# Patient Record
Sex: Male | Born: 1945
Health system: Southern US, Community
[De-identification: ages and names within clinical notes are randomized; demographics above are authoritative.]

## PROBLEM LIST (undated history)

## (undated) DIAGNOSIS — M1990 Unspecified osteoarthritis, unspecified site: Secondary | ICD-10-CM

## (undated) DIAGNOSIS — E669 Obesity, unspecified: Secondary | ICD-10-CM

## (undated) DIAGNOSIS — E119 Type 2 diabetes mellitus without complications: Secondary | ICD-10-CM

## (undated) DIAGNOSIS — E039 Hypothyroidism, unspecified: Secondary | ICD-10-CM

## (undated) DIAGNOSIS — I739 Peripheral vascular disease, unspecified: Secondary | ICD-10-CM

## (undated) DIAGNOSIS — I10 Essential (primary) hypertension: Secondary | ICD-10-CM

## (undated) HISTORY — DX: Essential (primary) hypertension: I10

## (undated) HISTORY — DX: Obesity, unspecified: E66.9

## (undated) HISTORY — DX: Type 2 diabetes mellitus without complications: E11.9

## (undated) HISTORY — DX: Unspecified osteoarthritis, unspecified site: M19.90

## (undated) HISTORY — PX: APPENDECTOMY: SHX54

## (undated) HISTORY — DX: Peripheral vascular disease, unspecified: I73.9

## (undated) HISTORY — PX: TONSILLECTOMY: SUR1361

## (undated) HISTORY — DX: Hypothyroidism, unspecified: E03.9

---

## 2007-05-21 ENCOUNTER — Inpatient Hospital Stay (HOSPITAL_COMMUNITY): Admission: EM | Admit: 2007-05-21 | Discharge: 2007-05-27 | Payer: Self-pay | Admitting: Emergency Medicine

## 2007-05-21 IMAGING — CR DG CHEST 1V PORT
1 series · 1 of 1 positions shown · non-contrast
Comparison: none

HISTORY: Chest pain, hypertension, former smoker

PORTABLE CHEST ONE VIEW:
Portable exam [Q9] hours without priors for comparison.
Upper normal heart size.
Tortuous aorta.
Pulmonary vascularity normal.
Question COPD.
Minimal bibasilar atelectasis.
Lungs otherwise clear.

[view not recorded]
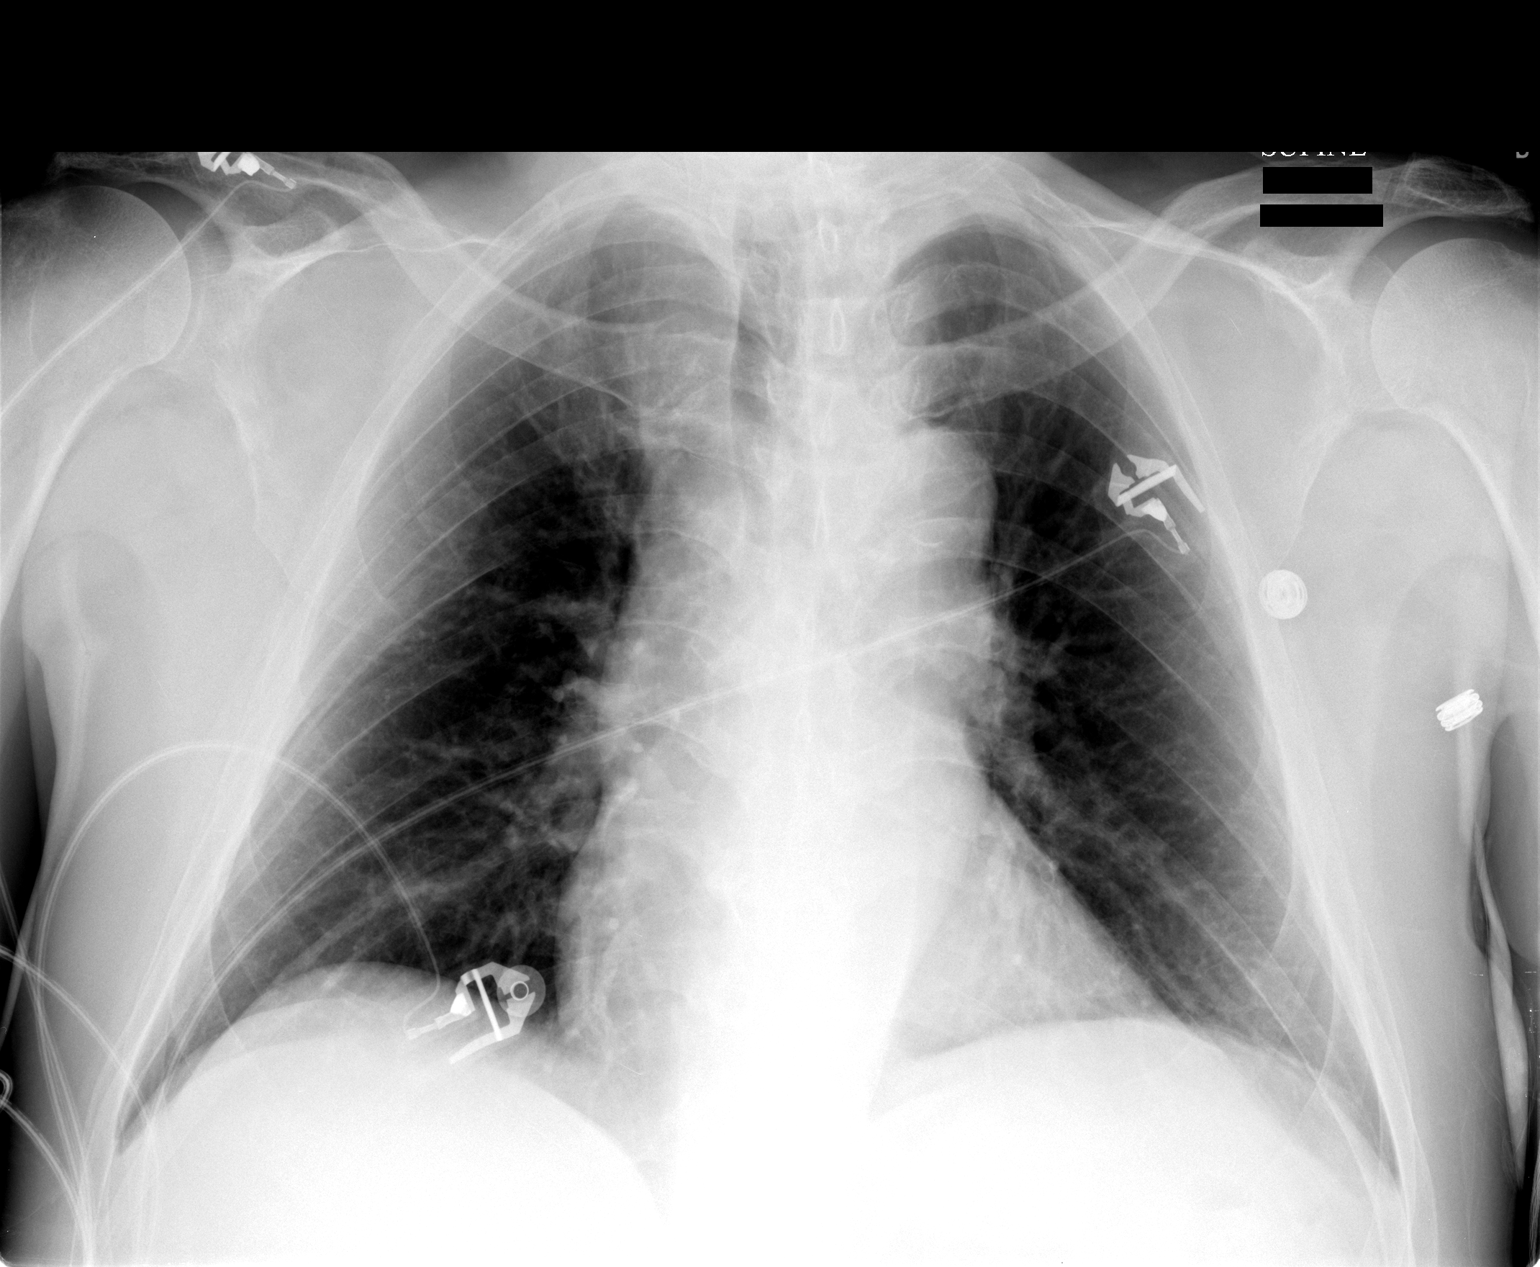

[1 of 1 positions shown; findings below may reference images not displayed]

IMPRESSION: Probable COPD with minimal bibasilar atelectasis.

## 2007-05-22 ENCOUNTER — Encounter: Payer: Self-pay | Admitting: Surgery

## 2007-05-22 ENCOUNTER — Ambulatory Visit: Payer: Self-pay | Admitting: *Deleted

## 2007-05-22 IMAGING — CR DG CHEST 1V PORT
1 series · 1 of 1 positions shown · non-contrast
Comparison: none

CLINICAL DATA: Chest pain.
AP PORTABLE SITTING UPRIGHT CHEST ? [DATE] AT [DATE] A.M.:
Comparison Examination: [DATE] at [DATE] p.m.

[AP]
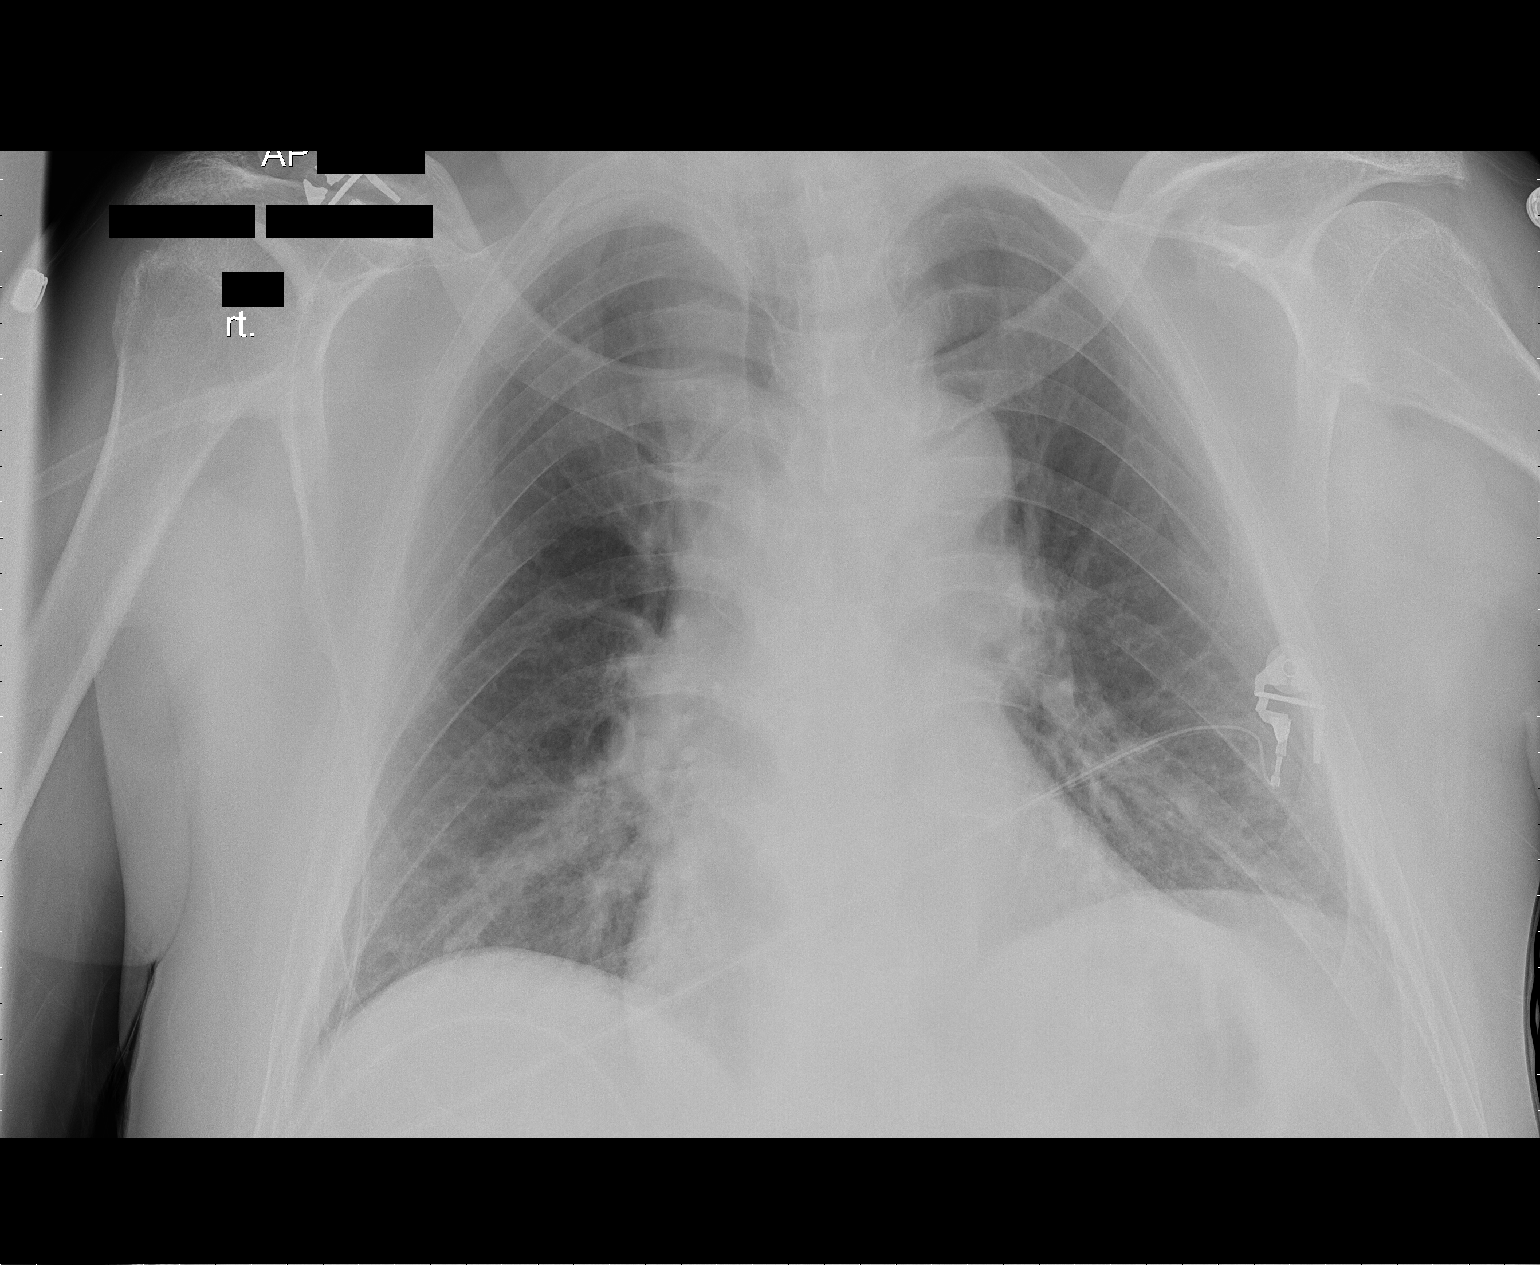

[1 of 1 positions shown; findings below may reference images not displayed]

FINDINGS: Cardiomegaly and mild pulmonary vascular prominence.  Tortuous aorta.  No infiltrate or congestive heart failure. The patient will eventually benefit from a two-view chest.
IMPRESSION: 1.  Cardiomegaly and mild pulmonary vascular prominence. 
2.  Tortuous aorta.
3.  No infiltrate or congestive heart failure.

## 2007-05-23 ENCOUNTER — Encounter: Payer: Self-pay | Admitting: Surgery

## 2007-05-23 ENCOUNTER — Ambulatory Visit: Payer: Self-pay | Admitting: Surgery

## 2007-05-23 IMAGING — CR DG CHEST 1V PORT
1 series · 1 of 1 positions shown · non-contrast
Comparison: [DATE].

CLINICAL DATA: Status post CABG. 
 PORTABLE CHEST ? 1 VIEW:

[view not recorded]
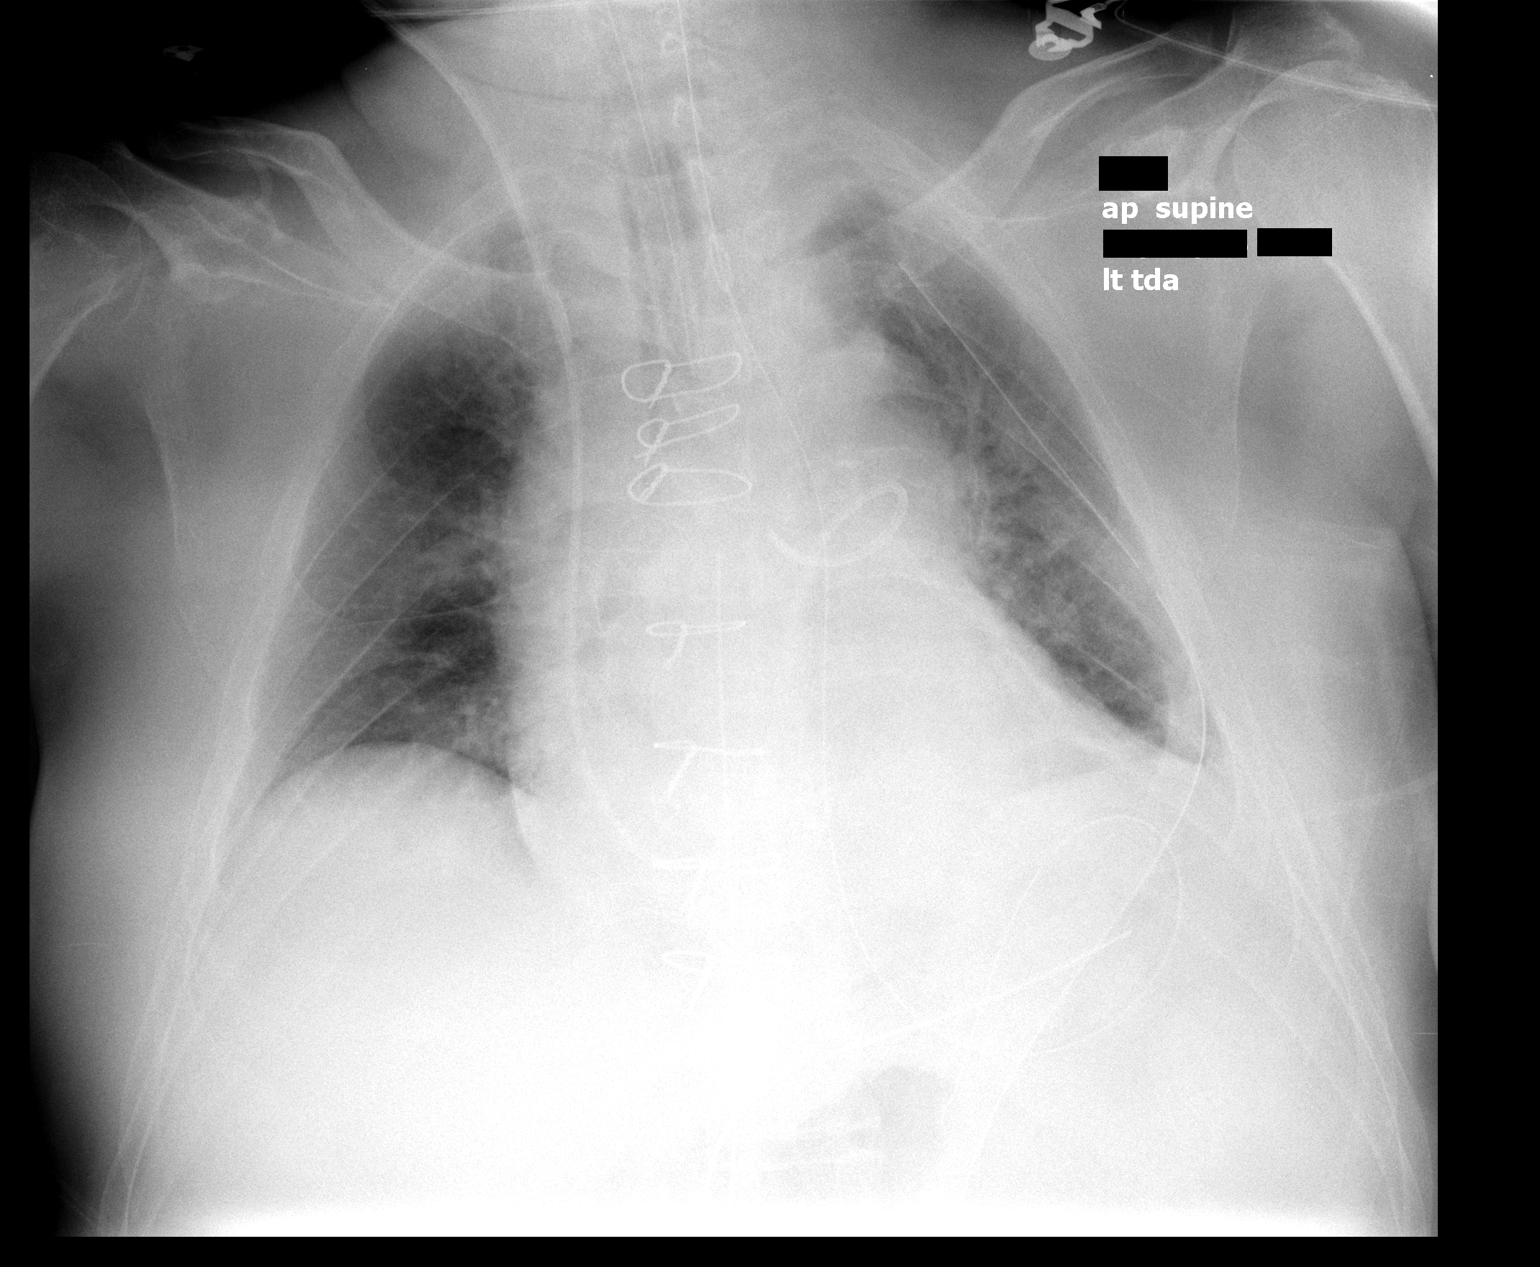

[1 of 1 positions shown; findings below may reference images not displayed]

FINDINGS: The patient is status post cardiac surgery with endotracheal tube tip 3.5 cm above the carina, an NG tube entering the stomach, mediastinal and left thoracostomy tubes and Swan-Ganz catheter with tip extending toward the right pulmonary artery.  This is a low volume film with mild basilar atelectasis and minimal pulmonary vascular congestion.  There is no evidence of pneumothorax.
IMPRESSION: Status post CABG with support tubes as described with mild basilar atelectasis and mild vascular congestion.

## 2007-05-24 IMAGING — CR DG CHEST 1V PORT
1 series · 1 of 1 positions shown · non-contrast
Comparison: [DATE].

CLINICAL DATA: Status post CABG. 
 PORTABLE CHEST - 1 VIEW:

[view not recorded]
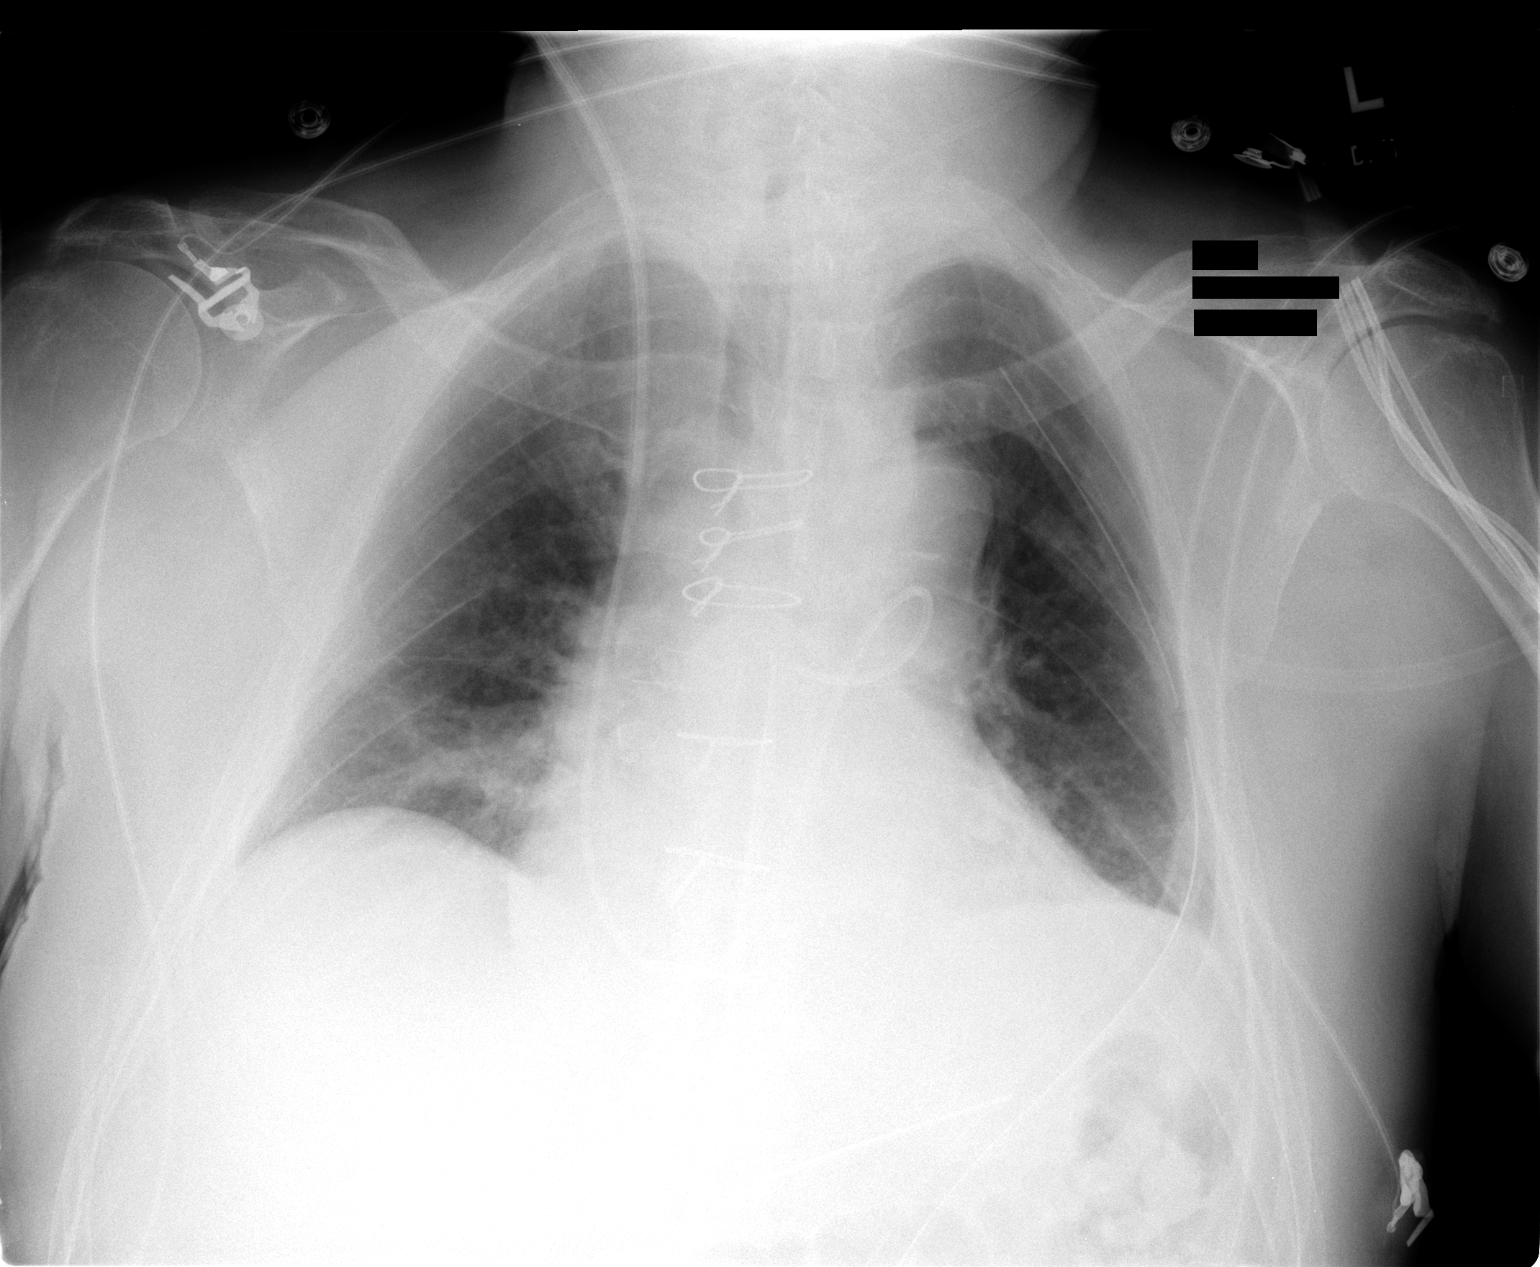

[1 of 1 positions shown; findings below may reference images not displayed]

FINDINGS: Endotracheal  and nasogastric tubes have been removed.  Right IJ Swan-Ganz catheter tip terminates over the right pulmonary artery.  Mediastinal drains and left chest tube remain in place.  No pneumothorax.  Bibasilar plate like atelectasis noted.  No pneumothorax.  Cardiomegaly noted.
IMPRESSION: 1.  Bibasilar plate like atelectasis again noted.  
 2.  Cardiomegaly, no edema.

## 2007-05-25 IMAGING — CR DG CHEST 2V
2 series · 2 of 2 positions shown · non-contrast
Comparison: [DATE].

CLINICAL DATA: 61-year-old with chest pain, bypass surgery. 
 PORTABLE CHEST - 1 VIEW [DATE]:

[w chest pa]
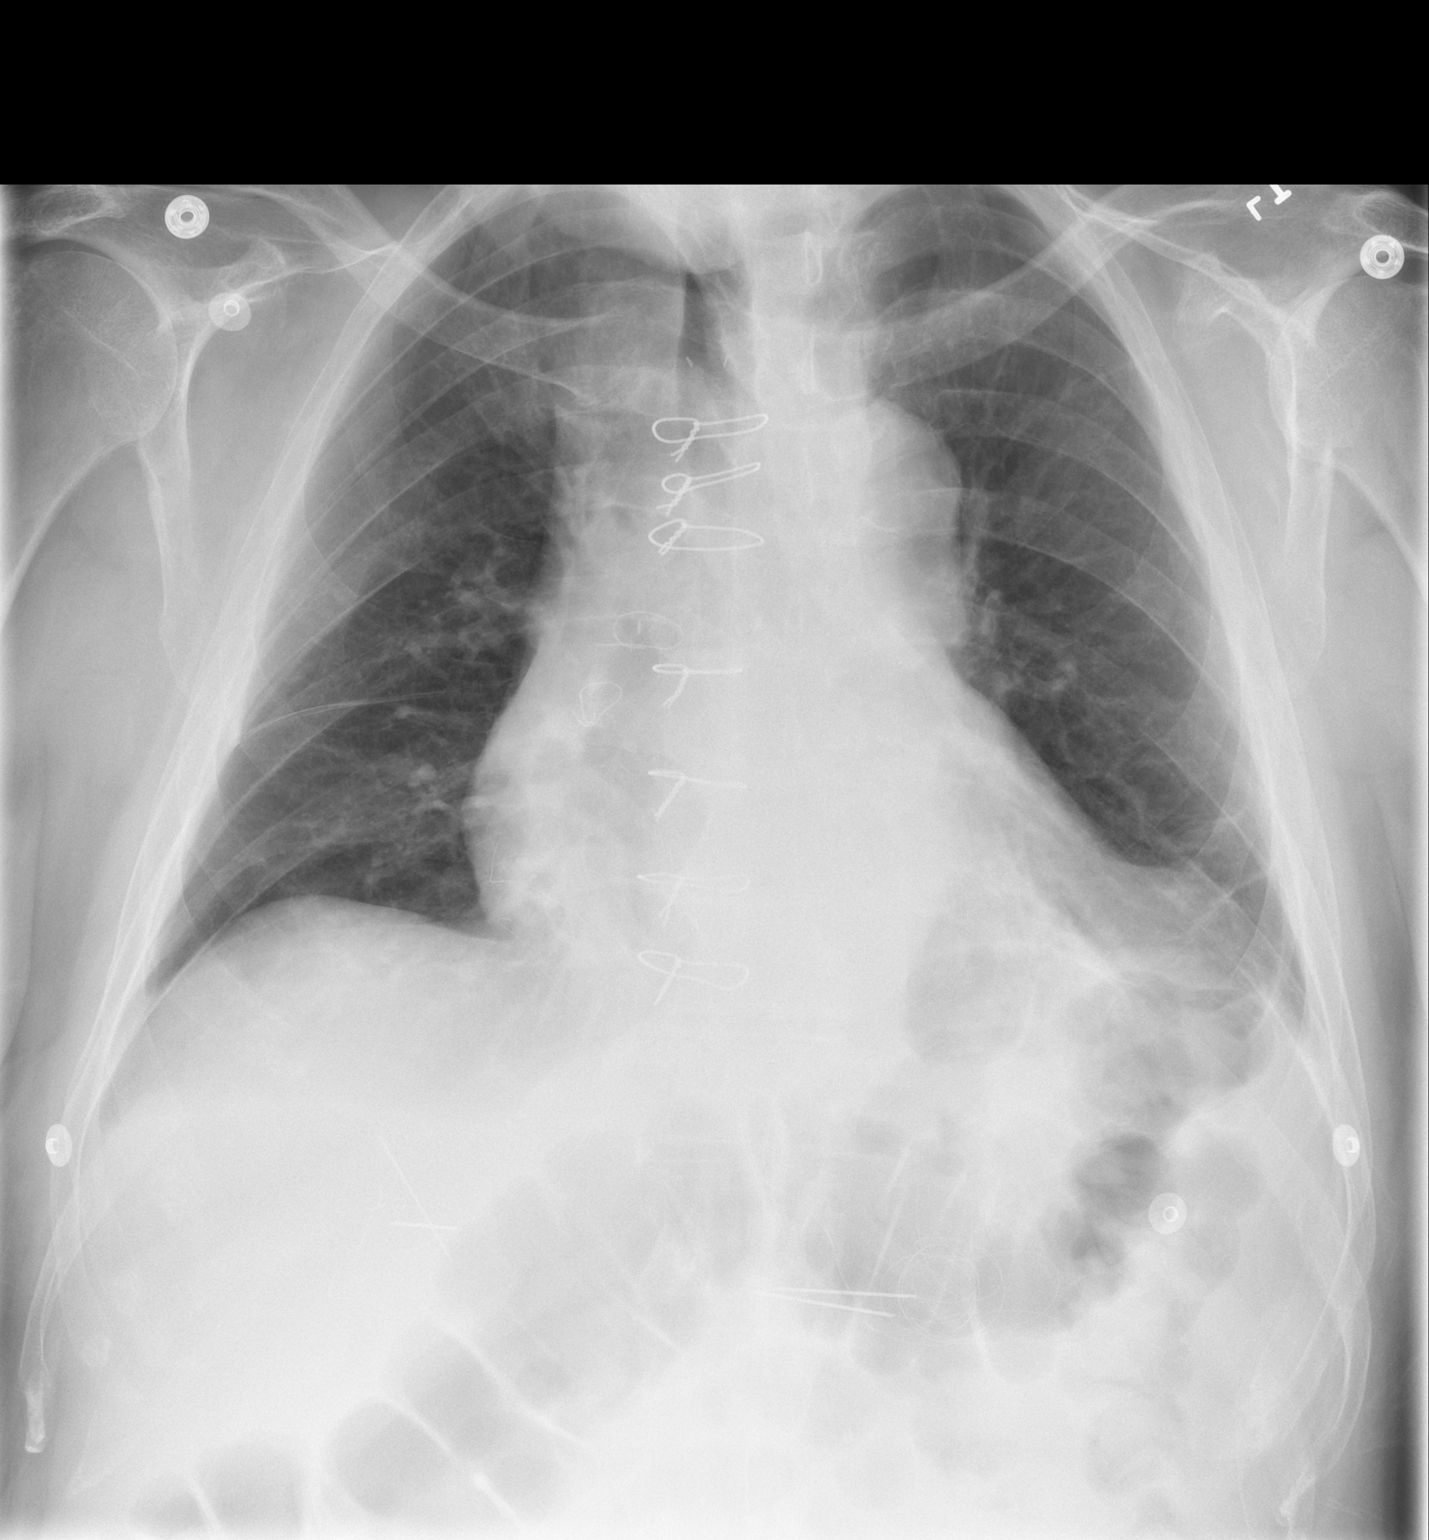

[w chest lat]
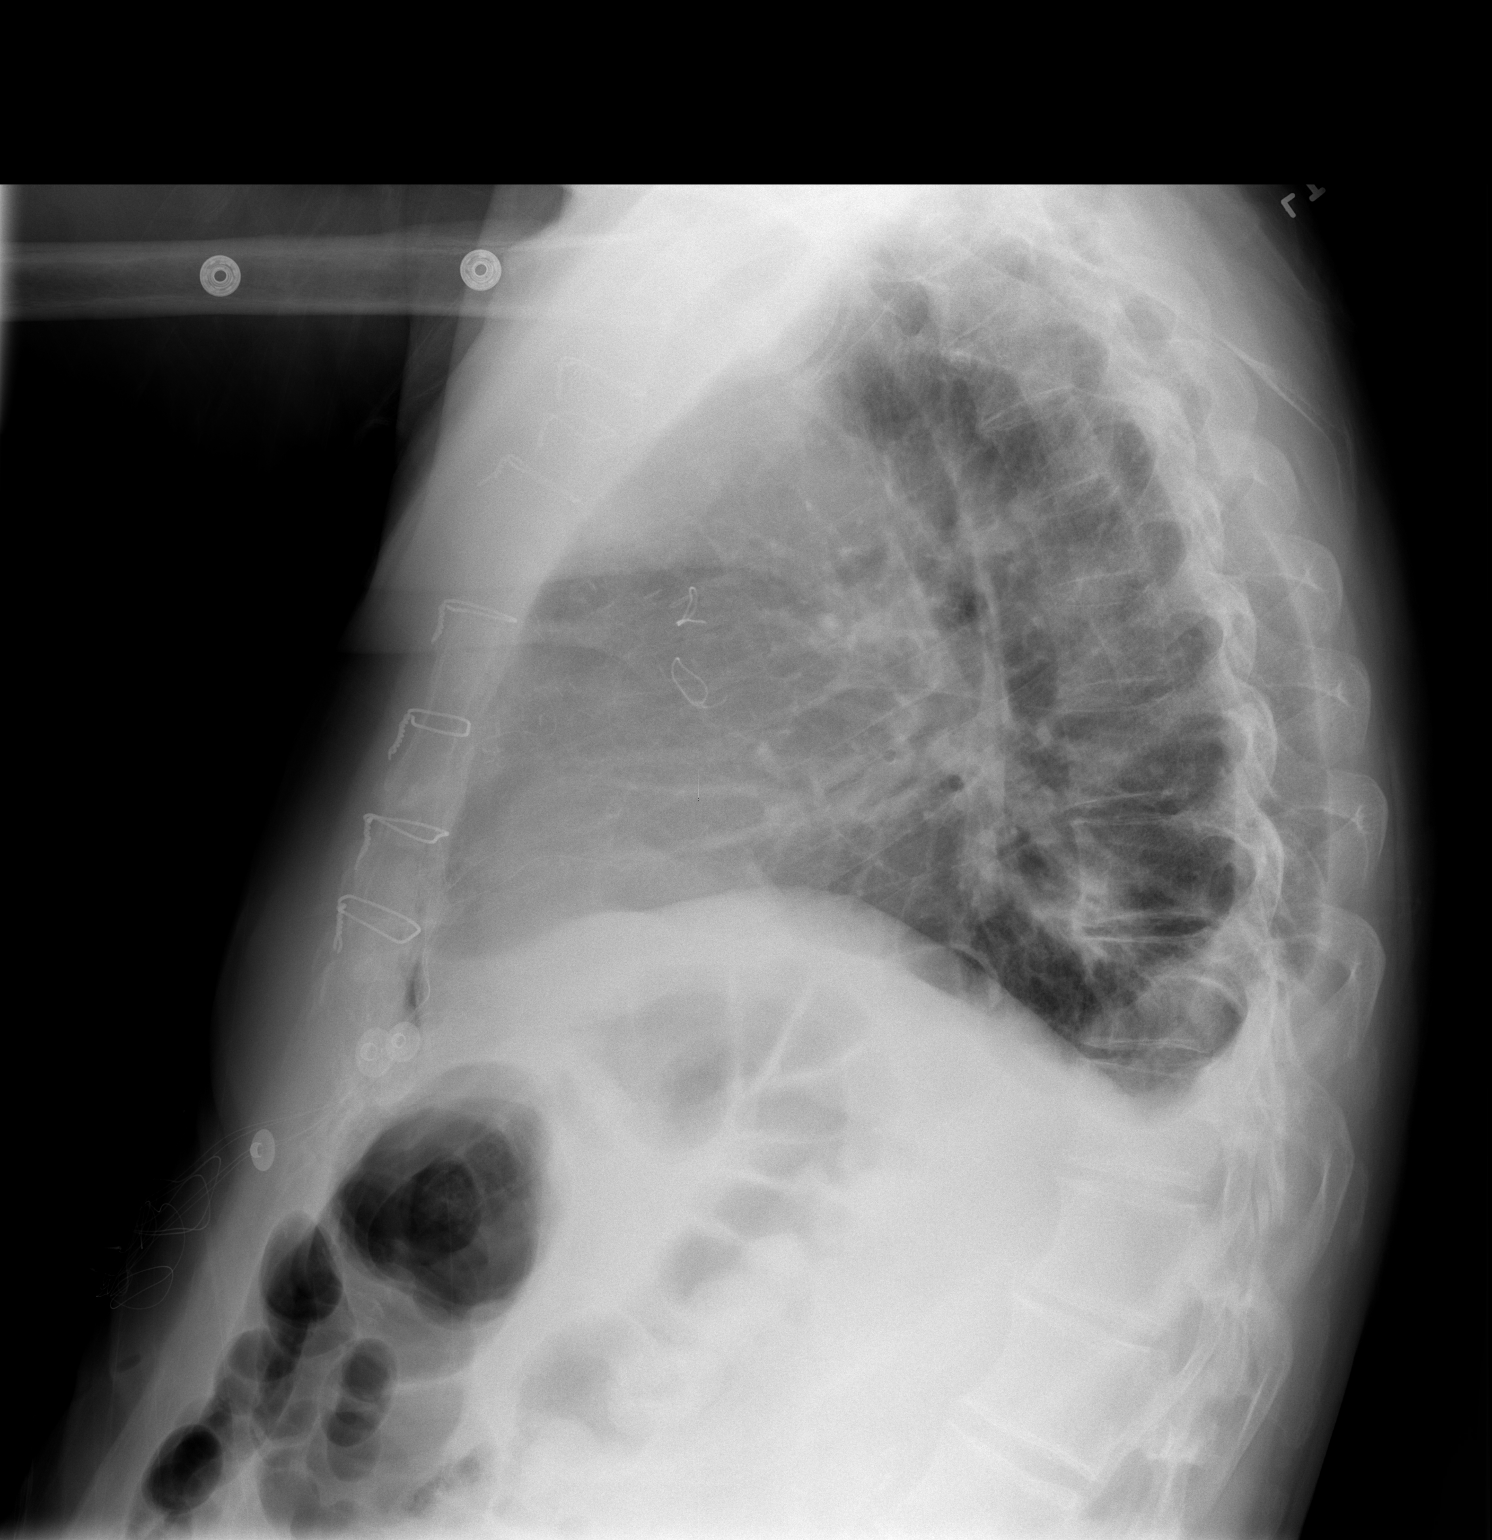

[2 of 2 positions shown; findings below may reference images not displayed]

FINDINGS: The Swan Ganz catheter and mediastinal drain tube has been removed.  The left chest tube has been removed.  No left-sided pneumothorax is seen.  Streaky bibasilar atelectasis and a small left effusion.  No pulmonary edema.
IMPRESSION: 1.  Removal of left-sided chest tube, mediastinal drain tubes and Swan Ganz catheter.  No definite left-sided pneumothorax is seen.  
 2.  Streaky bibasilar atelectasis left greater than right and possible small left pleural effusion.

## 2007-07-03 ENCOUNTER — Encounter: Admission: RE | Admit: 2007-07-03 | Discharge: 2007-07-03 | Payer: Self-pay | Admitting: Surgery

## 2007-07-03 ENCOUNTER — Ambulatory Visit: Payer: Self-pay | Admitting: Surgery

## 2007-07-03 IMAGING — CR DG CHEST 2V
2 series · 2 of 2 positions shown · non-contrast
Comparison: [DATE]

CLINICAL DATA: CABG

[view not recorded (1 of 2)]
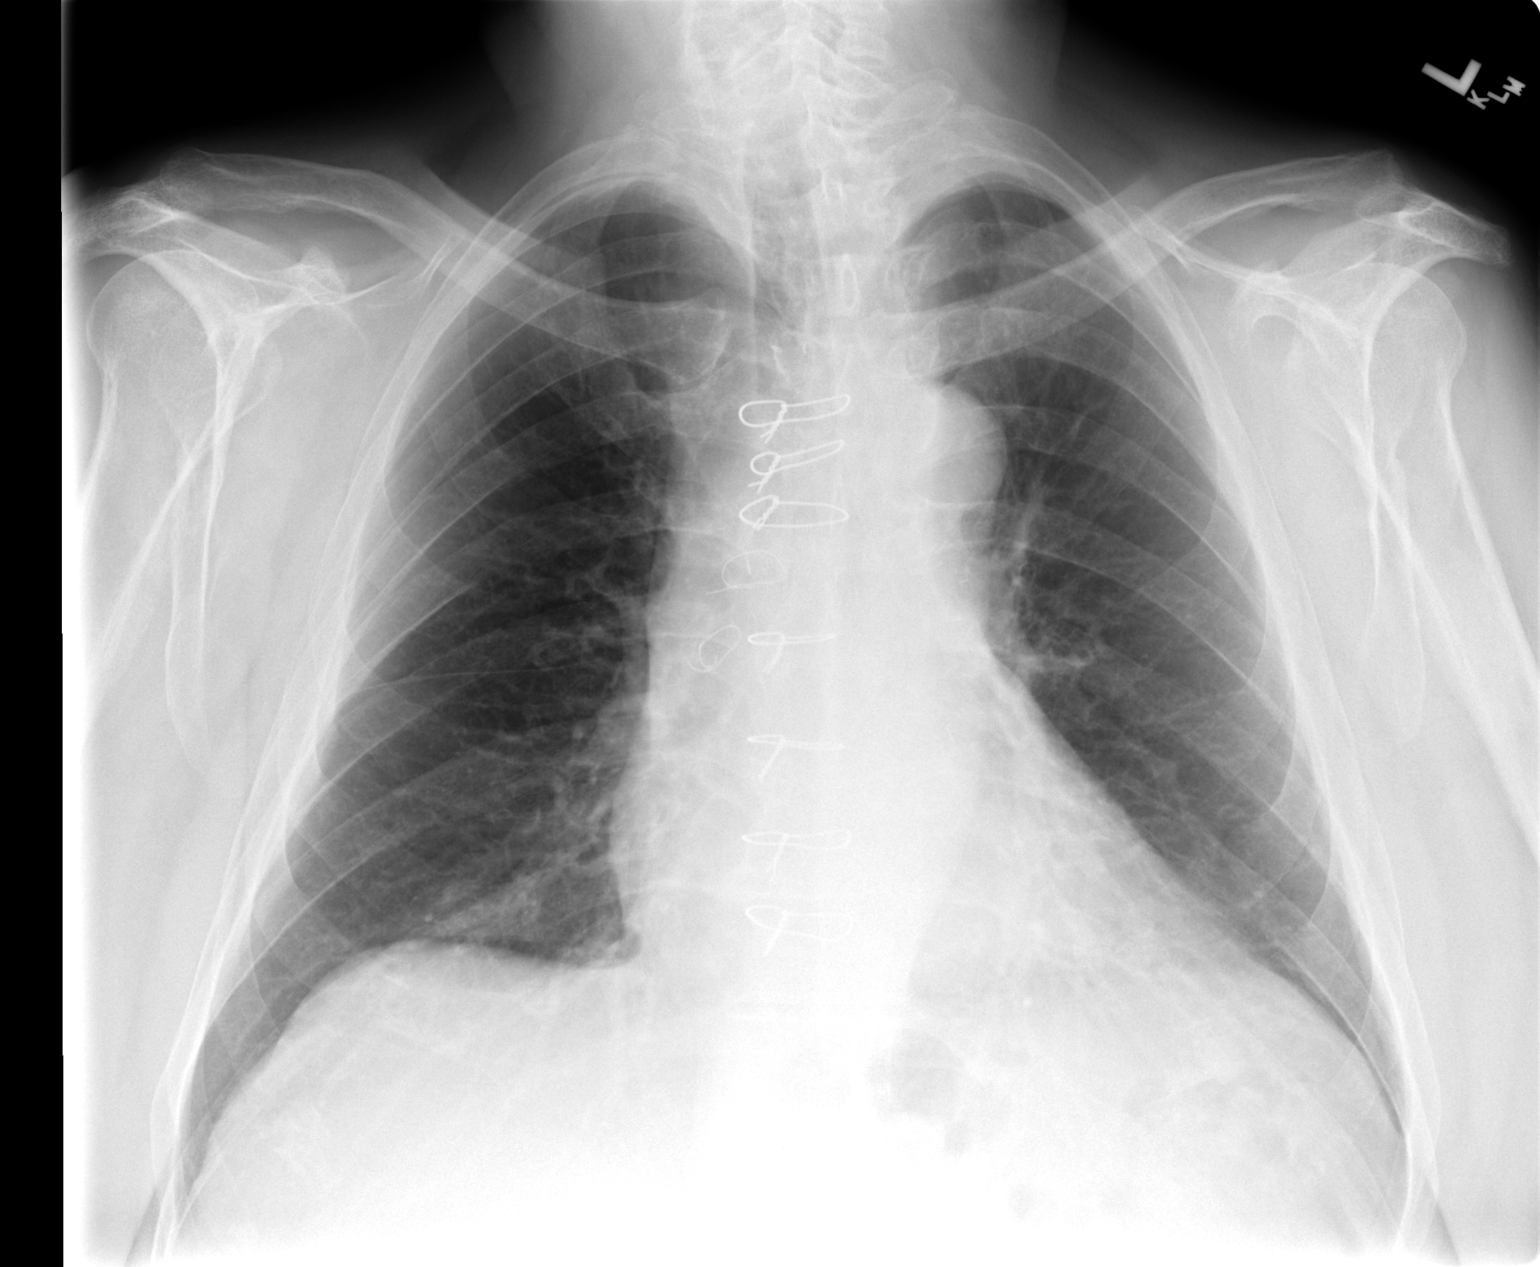

[view not recorded (2 of 2)]
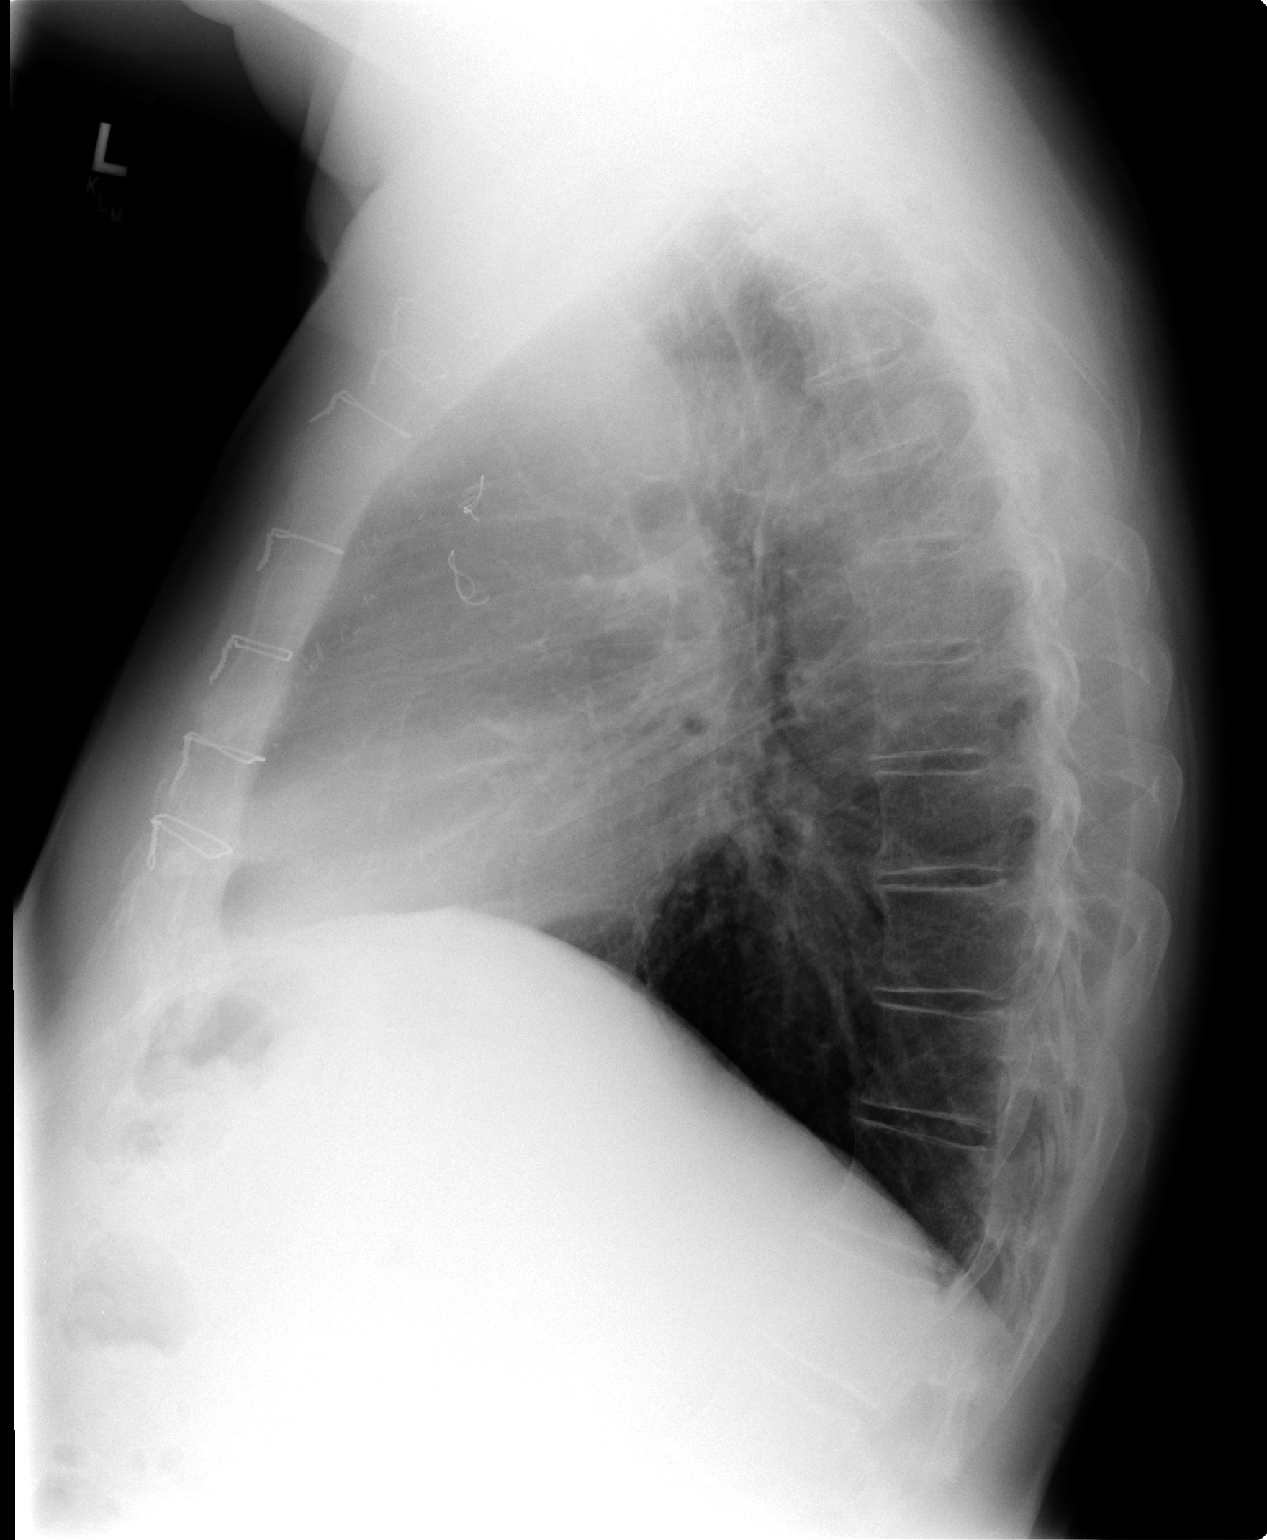

[2 of 2 positions shown; findings below may reference images not displayed]

CHEST - 2 VIEW:

Cardiopericardial silhouette is within normal limits for size. The lungs are
clear bilaterally. Patient is status post CABG. Imaged bony structures of the
thorax are intact.
IMPRESSION: No acute cardiopulmonary process

## 2010-11-09 NOTE — Cardiovascular Report (Signed)
NAME:  Jason Cox, Jason Cox NO.:  [id number]   MEDICAL RECORD NO.:  [id number]          PATIENT TYPE:  INP   LOCATION:  2913                         FACILITY:  [facility]   PHYSICIAN:  Madaline Savage, M.D.DATE OF BIRTH:  02-14-1946   DATE OF PROCEDURE:  05/21/2007  DATE OF DISCHARGE:                            CARDIAC CATHETERIZATION   PROCEDURES PERFORMED:  1. Selective coronary angiography by Judkins' technique.  2. Retrograde left heart catheterization.  3. Left ventricular angiography.  4. Abdominal suprarenal abdominal aortography with bi-femoral runoff.  5. Selective visualization of the left internal mammary artery.   COMPLICATIONS:  None.   ENTRY SITE:  Right femoral dye, used Omnipaque.   MEDICATIONS:  Given IV Versed for sedation, intravenous nitroglycerin,  and intravenous Integralin was begun in the cath lab, as well as IV  heparin.   PATIENT PROFILE:  The patient is a 65 year old seemingly healthy  gentleman who had very little in the way of medical care from his  primary caregiver, [facility].  He entered that office  today with complaints of having some substernal discomfort, which was  graded a 1/10.  He will was thereafter seen by the doctors there, and  after an EKG revealed multiple abnormalities that were concerning for  acute ST-segment elevation MI, he was transported to [facility]  and arrived in the emergency room where I saw him.  I repeated the EKG,  and over the interval time that it took him to get to the hospital there  was worsening of ST elevation.  He was therefore taken emergently to the  cath lab where cardiac catheterization was begun.  Results are described  below:  1. Left main coronary artery normal.  2. LAD 100% occluded about 10 mm from it's origin, retrograde      collateral flow into mid and distal LAD by way of retrograde      collateralization of the circumflex.  Circumflex coronary artery  large and dominant with collateral flow to LAD and to RCA by way of      atrioventricular groove circumflex collateralization.  3. Right coronary artery 100% occluded at the ostium, with homo-      collateralization which was not very abundant.  The main flow into      the distal RCA was by retrograde collateralization from the left      circumflex.  There is bypassable RCA territory distally.  4. LVEF 30% with anterior wall stunting, no mitral regurgitation or LV      thrombus.  5. Left internal mammary artery widely patent throughout and appears      to be a good conduit for potential bypass grafting.  6. Abdominal aortography shows both renal arteries to be widely patent      without disease.  Abdominal aorta also appears to be free of any      disease.  There was some faint calcification along the border of      the abdominal aorta.  Both common femorals appear normal.   Left ventricular angiogram shows stunting of the anterior wall.  Apex is  dyskinetic.  Inferior wall is hypokinetic.  Ejection fraction estimate  is 30%, no mitral regurgitation seen.  That was in the RAO view.  The  LAO view showed that the septum was markedly hypokinetic.  The  anterolateral wall was hyperdynamic.   FINAL IMPRESSIONS:  1. Severe two-vessel disease of the RCA and LAD, with 100% occlusion      of both vessels as described above.  2. Dominance of left circumflex system with collateralization of LAD      and RCA, by way of this circumflex vessel.  The circumflex itself      has multiple areas of narrowing of 75%.  3. LV systolic function 30% ejection fraction.   PLAN:  CVTS consult from Dr. Laneta Simmers.           ______________________________  Madaline Savage, M.D.     WHG/MEDQ  D:  05/21/2007  T:  05/22/2007  Job:  [id number]   cc:   Evelene Croon, M.D.  [facility], [facility]

## 2010-11-09 NOTE — Op Note (Signed)
NAME:  Jason Cox, Jason Cox NO.:  [id number]   MEDICAL RECORD NO.:  [id number]          PATIENT TYPE:  INP   LOCATION:  2315                         FACILITY:  [facility]   PHYSICIAN:  Evelene Croon, M.D.     DATE OF BIRTH:  07/23/1945   DATE OF PROCEDURE:  05/23/2007  DATE OF DISCHARGE:                               OPERATIVE REPORT   PREOPERATIVE DIAGNOSIS:  Severe 3-vessel coronary artery disease, status  post ST-segment elevation myocardial infarction.   POSTOPERATIVE DIAGNOSIS:  Severe 3-vessel coronary disease status post  ST-segment elevation myocardial infarction.   OPERATIVE PROCEDURES:  1. Median sternotomy.  2. Extracorporeal circulation.  3. Coronary bypass graft surgery times 3 using left internal mammary      artery graft to left anterior descending coronary, with a saphenous      vein graft to the obtuse marginal branch of the left circumflex      coronary and a saphenous vein graft to posterior descending branch      of the right coronary.  4. Endoscopic vein harvesting from the right leg.   ASSISTANT:  Coral Ceo, PA-C.   ANESTHESIA:  General endotracheal.   CLINICAL HISTORY:  This patient is a 65 year old gentleman with no prior  cardiac history who presented with a 56-month history of intermittent  substernal chest pain at rest and with exertion.  He was presenting to  his primary care physician for symptoms of exacerbation of his psoriasis  as well as severe throat and sinus drainage.  After relating his chest  pain symptoms, he had electrocardiogram which was markedly abnormal and  he was referred to the emergency room, where he was again noted to have  ST-segment elevation anteriorly as well as inferior ST depression.  His  initial CPK was fairly normal with mild elevation of his troponin.  His  enzymes significantly increased after admission.  After his initial  presentation in the emergency room, he was taken to catheterization lab  emergently where catheterization showed severe 3-vessel disease.  There  was total occlusion of his proximal LAD after a small first diagonal  branch.  There were faint collaterals filling the distal LAD.  The left  circumflex gave off a large branching marginal.  There was about 50%  narrowing in the first subbranch of this marginal and 75% narrowing  within the continuation of the marginal branch.  The right coronary  artery was occluded proximally with bridging collaterals as well as  collaterals from the left circumflex filling the posterior descending  branch.  Ejection fraction of about 30% with anterior apical akinesis.  There was no mitral regurgitation.  The patient became pain free after  cardiac catheterization on heparin nitroglycerin and Integrilin.  It was  felt that coronary bypass graft surgery was the best treatment to  further ischemia and infarction and hopefully improve his anterior wall  which may have been stunned.  I discussed the operative procedure with  the patient including alternatives, benefits and risks including but not  limited to bleeding, blood transfusion, infection, stroke, myocardial  infarction, graft failure and death.  He understood  and agreed to  proceed.   OPERATIVE PROCEDURE:  The patient was taken to the operating room and  placed on the table supine in position.  After induction of general  endotracheal anesthesia, a Foley catheter was placed in bladder using  sterile technique.  The chest, abdomen and both lower extremities were  prepped and draped in the usual sterile manner.  The chest was entered  through a median sternotomy incision.  The pericardium opened in the  midline.  Examination of the heart showed good ventricular  contractility.  The ascending aorta had no palpable plaques in it.   A transesophageal echocardiogram was performed by Anesthesiology.  This  showed overall very good left ventricular function.  The anterior wall   looked like it was contracting well.  There was some septal hypokinesis.  There was no mitral regurgitation.   Then the left internal mammary artery was harvested from the chest wall  as pedicle graft.  This was a medium caliber vessel with excellent blood  pressure.  At the same time, a segment of greater saphenous vein was  harvested from the right leg using endoscopic vein harvest technique.  This vein was of medium size and good quality.   Then the patient was heparinized and when an adequate ACT was obtained,  the distal ascending aorta was cannulated using a  20-French aortic  cannula for arterial inflow.  Venous outflow was achieved using a 2-  stage venous cannula through the right atrial appendage.  Antegrade  cardioplegia and vent cannula was inserted in the aortic root.  Retrograde quick pinch cannula was inserted through the right atrium  into the coronary sinus.   The patient placed on cardiopulmonary bypass and the distal coronaries  identified.  The LAD was intramyocardial throughout its proximal and mid-  portions.  It exited near the apex of the heart, where it was a small  vessel.  The diagonal branch was small.  The obtuse marginal branch was  completely intramyocardial.  I was able to locate the major subbranch of  this which was a large graftable vessel.  There was a large amount  epicardial fact completely covering his heart which made exposure of the  vessels more difficult.  The right coronary artery gave off a moderate  size posterior descending branch.  There was no significant disease in  that.   Then the aorta was cross-clamped and 400 mL of cold blood antegrade  cardioplegia was administered in the aortic root with slow arrest the  heart.  This was followed by 400 mL of cold blood retrograde  cardioplegia which resulted in complete arrest of the heart.  Systemic  hypothermia to 28 degrees centigrade and topical hypothermic iced saline  was used.  A  temperature probe was placed in septum and insulating pad  in the pericardium.  We were able to get myocardial temperature down to  around 8 degrees centigrade.  Additional doses of retrograde  cardioplegia were given about 20-minute intervals.   The first distal anastomosis was then performed to the posterior  descending artery.  The internal diameter was about 1.75 mm.  The  conduit used was a segment of greater saphenous vein.  The anastomosis  performed in an end-to-side manner using continuous 7-0 Prolene suture.  Flow was noted through the graft and was excellent.   The second distal anastomosis was performed in the obtuse marginal  branch.  The internal diameter was about 2 mm.  The conduit used was  a  second segment of the greater saphenous vein.  Anastomosis performed in  an end-to-side manner using continuous  7-0 Prolene suture.  Flow was  noted through the graft and was excellent.   The third distal anastomosis was performed to the distal LAD.  I tried  to find the LAD in its proximal and mid-portions but it was deep  intramyocardial and I could not locate it.  The distal LAD was small but  felt to be graftable.  It was traced proximally until it entered the  septum.  Just beyond this point, I opened it and the internal diameter  was about 1.5 mm.  A probe was passed proximally and passed most of the  way up the LAD.  It was indeed deep within the septum.  The conduit used  was a left internal mammary graft and this was brought through an  opening in the left pericardium anterior to the phrenic nerve.  It was  anastomosed to the LAD in an end-to-side manner using continuous 8-0  Prolene suture.  The pedicle was sutured to the epicardium with 6-0  Prolene sutures.  The patient was then rewarmed to 37 degrees  centigrade.  With the cross-clamp in place, the 2 proximal vein graft  anastomoses were performed in an end-to-side manner using continuous 6-0  Prolene suture.  Then  the clamp was removed from the mammary pedicle.  There was rapid warming of the ventricular septum and return of  spontaneous ventricular fibrillation.  Cross-clamp was removed at the  time of 85 minutes and the patient was defibrillated into sinus rhythm.   The proximal and distal anastomosis appeared hemostatic and the grafts  satisfactory.  Graft markers were placed around the proximal  anastomosis.  Two temporary right ventricular and right atrial pacing  wires placed were placed and brought out through the skin.   When the patient rewarmed to 37 degrees centigrade, he was weaned from  cardiopulmonary bypass on no inotropic agents.  Total bypass time was  110 minutes.  Cardiac function appeared excellent with a cardiac output  of 5 liters per minute.  Transesophageal echocardiogram showed  essentially normal left ventricular function.  Even the septum appeared  to be moving better.  Protamine was then given and the venous and aortic  catheters removed without difficulty.  Hemostasis was achieved.  Three  chest tubes were placed in; 2 in the post pericardium, 1 left pleural  space and 1 in the anterior mediastinum.  The sternum was then closed  with #6 stainless steel wires.  Fascia was closed with continuous #1  Vicryl suture.  Subcutaneous tissue was closed with continuous 2-0  Vicryl and the skin with 3-0 Vicryl subcuticular closure.  The lower  extremity vein harvest site was closed in layers in a similar manner.  The sponge, needle and instrument counts were correct according to the  scrub nurse.  Dry sterile dressings were applied over the incisions and  around the chest tubes which were at Pleur-Evac suction.  The patient  remained hemodynamically stable and was transported to the SICU in  guarded but stable condition.      Evelene Croon, M.D.  Electronically Signed     BB/MEDQ  D:  05/23/2007  T:  05/24/2007  Job:  [id number]   cc:   Madaline Savage, M.D.

## 2010-11-09 NOTE — Consult Note (Signed)
NAME:  Jason Cox, Jason Cox NO.:  [id number]   MEDICAL RECORD NO.:  [id number]          PATIENT TYPE:  INP   LOCATION:  2399                         FACILITY:  [facility]   PHYSICIAN:  Evelene Croon, M.D.     DATE OF BIRTH:  [birth year]   DATE OF CONSULTATION:  05/22/2007  DATE OF DISCHARGE:                                 CONSULTATION   REASON FOR CONSULTATION:  Severe three-vessel coronary artery disease,  status post acute ST-segment elevation MI.   CLINICAL HISTORY:  I was asked by Dr. Chanda Busing to evaluate [name] for consideration of coronary artery bypass graft surgery.  He  is a 65 year old gentleman with no prior cardiac history who presented  to his primary physician Dr. Sherwood Gambler in [place] yesterday morning with  complaints of sore throat and sinus drainage as well as some  exacerbation of his psoriasis.  He had been having intermittent  substernal chest pressure with rest and exertion over the past 2 months  and related this history to Dr. Sherwood Gambler since he was having some while he  was in the office.  He had an electrocardiogram that was abnormal, and  he was transferred to [facility] as an acute ST-segment elevation MI.  In the Emergency Room, he was noted to have an electrocardiogram showing  ST elevation in lead V1  and ST depression in lead V3 through 6 as well  as 2, 3, and aVF.  He was taken to the catheterization lab urgently  which showed severe three-vessel disease.  There was 100% proximal LAD  occlusion after the first diagonal branch which was small.  There were  faint collaterals to the LAD from the left circumflex.  The left  circumflex gave off a single large branching marginal artery that had 3  sub-branches.  The first sub-branch had about 50% ostial proximal  stenosis and the continuation of this marginal branch had about 75%  proximal stenosis.  The second sub-branch had about 50% proximal  stenosis.  The right coronary artery was  occluded in its proximal  portion with bridging collaterals filling the distal vessel which had a  large posterior descending branch.  Ejection fraction was about 30% with  the anterior apical akinesis.   REVIEW OF SYSTEMS:  GENERAL:  He has had no fever or chills.  He has had  no recent weight changes.  He denies any fatigue.  EYES:  Negative.  ENT:  Negative.  ENDOCRINE:  He denies diabetes and hypothyroidism.  CARDIOVASCULAR:  As above.  He has had about 2 month history of  intermittent substernal chest pain which he thought was due to  heartburn.  He has had no shortness of breath.  He denies any PND or  orthopnea.  He has had no peripheral edema.  Denies palpitations.  RESPIRATORY:  Denies cough and sputum production.  GI:  He denies nausea  or vomiting.  He has had no melena or bright red blood per rectum.  GU:  He denies dysuria and hematuria.  MUSCULOSKELETAL:  He has a history of  arthritis particularly involving his hands.  SKIN:  He has a history of  psoriasis which has recently exacerbated.   ALLERGIES:  NONE.   PAST MEDICAL HISTORY:  History of arthritis and psoriasis.  This only  previous surgery was appendectomy.   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  He has 1 brother and is unmarried with no children.   FAMILY HISTORY:  Positive for cardiac disease.  His father died at age  14, myocardial infarction.   PHYSICAL EXAMINATION:  VITAL SIGNS:  His blood pressure is 93/55.  His  pulse is 79 and regular.  Respiratory rate is 16 and unlabored.  GENERAL:  He is a well-developed, white male in no distress.  Weight is  90 kg.  HEENT:  Normocephalic and atraumatic.  Pupils are equal and reactive to  light and accommodation.  Extraocular muscles are intact.  His throat is  clear.  NECK:  Normal carotid pulses bilaterally.  There are no bruits.  There  is no adenopathy or thyromegaly.  CARDIAC:  Regular rate and rhythm with  normal S1 and S2.  There is no  murmur, rub, or gallop.  LUNGS:  Clear.  ABDOMEN:  Active bowel sounds.  His abdomen is soft and nontender.  There are no palpable masses or organomegaly.  EXTREMITIES:  No peripheral edema.  Pedal pulses are strongly palpable  bilaterally.  SKIN:  Warm and dry.  He has multiple areas of psoriasis.  NEUROLOGIC:  Alert and oriented x3.  Motor and sensory exams grossly  normal.   LABORATORY EXAMINATION:  Peak CPK of 648 with an MB of 80, peak troponin  of 14.  His initial CPK was negative with a troponin of 0.18 on  presentation.  Hemoglobin A1c was 5.7.   IMPRESSION:  Jason Cox has severe three-vessel coronary artery disease  presenting with ST-segment elevation myocardial infarction.  His left  anterior descending and right coronary occlusions are chronic, and he  most likely had an increase in oxygen demand resulting in myocardial  infarction.  I agree that coronary artery bypass graft surgery is the  best treatment to prevent further ischemic infarction and hopefully  improve his anterior wall function.  I discussed the operative procedure  with the patient including alternatives, benefits, and risks including  but not limited to bleeding, blood transfusion, infection, stroke,  myocardial infarction, graft failure, and death.  He understands and  agrees to proceed.  We will plan to do this tomorrow.      Evelene Croon, M.D.  Electronically Signed     BB/MEDQ  D:  05/23/2007  T:  05/23/2007  Job:  [id number]   cc:   Madaline Savage, M.D.

## 2010-11-09 NOTE — Assessment & Plan Note (Signed)
OFFICE VISIT   Jason Cox, CUPP  DOB:  1945/08/22                                        July 03, 2007  CHART #:  [id number]   The patient returned today for followup status post coronary artery  bypass graft surgery on 05/23/2007.  He has done well postoperatively.  He said that his only complaint is that he still has some difficulty  keeping his mind focused on different tasks.  He has had no memory  disturbance or any other neurologic symptoms.  He has been walking daily  without chest pain or shortness of breath.  He is not participating in  cardiac rehab.   On physical examination, blood pressure is 141/85, his pulse is 104 and  regular.  Respiratory rate is 20 and unlabored.  Oxygen saturation in  room air is 98%.  He looks well.  Cardiac exam shows a regular rate and  rhythm with normal heart sounds.  His lung exam is clear.  Chest  incision is healing well and the sternum is stable.  His leg incision is  healing well and there is no lower extremity edema.   Followup chest x-ray today shows no acute cardiopulmonary process.   IMPRESSION:  Overall, Mr. Affeldt is recovering well following his  surgery.  I told him he could return to driving a car but should refrain  from lifting anything heavier than 10 pounds for a total of 3 months  from the date of surgery.  I encouraged him to continue walking as much  as possible.  He will continue to follow up with Dr. Elsie Lincoln and will  contact me if he develops any problems with the incisions.   Evelene Croon, M.D.  Electronically Signed   BB/MEDQ  D:  07/03/2007  T:  07/04/2007  Job:  [id number]   cc:   Madaline Savage, M.D.

## 2010-11-09 NOTE — Cardiovascular Report (Signed)
NAME:  Jason Cox, Jason Cox NO.:  [id number]   MEDICAL RECORD NO.:  [id number]          PATIENT TYPE:  INP   LOCATION:  2807                         FACILITY:  [facility]   PHYSICIAN:  Madaline Savage, M.D.DATE OF BIRTH:  10/28/1945   DATE OF PROCEDURE:  05/21/2007  DATE OF DISCHARGE:                            CARDIAC CATHETERIZATION   PROCEDURES PERFORMED:  1. Selective coronary angiography by Judkins technique, emergency.  2. Retrograde left heart catheterization.  3. Left ventricular angiography.  4. Suprarenal abdominal aortography.  5. Selective visualization of the an unbypassed left internal mammary      artery.   COMPLICATIONS:  None.   ENTRY SITE:  Right femoral.   DYE USED:  Omnipaque.   PATIENT PROFILE:  [name] is a 65 year old single white gentleman  who is a medical patient of Dr. Patrica Duel from [facility] in [place], who went to his office today and with the  complaints of having a sensation in his upper chest that he graded as  1/10.  In retrospect, the patient has a lot of denial about this in  terms of duration and severity.  It would appear that he has had these  symptoms for a few months.  In Dr. Geanie Logan office, an EKG was  performed, and it was abnormal, with ST-segment elevation and depression  in multiple leads.  Dr. Nobie Putnam initiated a transfer from his office to  the [facility] ER, where I met the patient, and when the EKG was repeated the  ST-segment elevations were worse in V1, and the depressions were worse  in V2-V6, including the inferior leads.  The patient was quickly  evaluated, blood was drawn, IVs were started, and he was brought to the  catheterization lab, which is where we ended up making the diagnosis of  coronary disease.  He was placed on the catheterization lab table, and  during catheterization no complications occurred.   RESULTS:   PRESSURES:  The patient's systemic pressure on entering  the  catheterization lab was 190/101, with a mean of 140.   The patient's anatomy was as follows:  The left main coronary artery was  patent and normal in appearance.  It trifurcated into an LAD, diagonal,  and circumflex.  The LAD was occluded approximately 10 mm beyond its  origin from the left main coronary artery, and there was no antegrade  flow into the mid- or distal LAD.  There was noted to be slow collateral  flow almost all the way back to the point of occlusion of the vessel by  way of retrograde collateral flow from the circumflex.  The diagonal  branch was a small vessel but potentially bypassable.   Left circumflex coronary artery was a huge and dominant vessel   Dictation ended at this point.           ______________________________  Madaline Savage, M.D.     WHG/MEDQ  D:  05/21/2007  T:  05/22/2007  Job:  [id number]

## 2010-11-09 NOTE — Discharge Summary (Signed)
NAME:  Jason Cox, Jason Cox NO.:  [id number]   MEDICAL RECORD NO.:  [id number]          PATIENT TYPE:  INP   LOCATION:  2020                         FACILITY:  [facility]   PHYSICIAN:  Evelene Croon, M.D.     DATE OF BIRTH:  1946-04-19   DATE OF ADMISSION:  05/21/2007  DATE OF DISCHARGE:                               DISCHARGE SUMMARY   PRIMARY ADMITTING DIAGNOSIS:  Chest pain.   ADDITIONAL/DISCHARGE DIAGNOSES:  1. Acute ST-segment elevation myocardial infarction.  2. Severe three-vessel coronary artery disease.  3. Psoriasis.  4. History of arthritis.   PROCEDURES PERFORMED:  1. Cardiac catheterization.  2. Coronary artery bypass grafting x3 (left internal mammary artery to      the left anterior descending artery, saphenous vein graft to the      obtuse marginal, saphenous vein graft to the posterior descending).  3. Endoscopic vein harvest, right thigh.   HISTORY:  The patient is a 65 year old male with no significant past  medical history.  He presented on the date of this admission to his  primary care physician, Dr. Sherwood Gambler, complaining of sore throat and sinus  drainage as well as an acute exacerbation of psoriasis.  In addition, he  related a history of intermittent chest discomfort which occurred with  exertion and at rest, which had been going on for the past 2 months.  They performed an EKG in the office, which was abnormal and was  indicative of an acute ST-segment elevation myocardial infarction.  Because of these findings, he was transferred to [facility] for  further evaluation and treatment.   HOSPITAL COURSE:  He was seen in the emergency department and was found  to have ST changes with elevations in lead V1 and depression in leads V3-  V6 as well as leads II, III and aVF.  He was seen in the emergency  department by [facility] and was taken urgently to  the catheterization lab, where cardiac catheterization showed  severe  three-vessel coronary artery disease.  He was not felt to be a good  candidate for percutaneous intervention.  At this point he was having no  pain and he was returned to the floor for a cardiothoracic surgery  consultation.  Dr. Evelene Croon saw the patient and reviewed his films  and agreed that his best course of action would be to proceed with  surgical revascularization.  He explained the risks, benefits and  alternatives of the procedure to the patient and he agreed to proceed  with surgery.  He remained stable in the hospital during his  preoperative workup which included carotid Dopplers which showed no  significant ICA stenosis bilaterally and ABIs, which were 1.18 on the  right and 0.99 on the left.  He was taken to the operating room on  May 23, 2007, and underwent CABG x3 as described in detail above.  He tolerated the procedure well and was transferred to the SICU in  stable condition.  Postoperatively he has done well.  He was extubated  shortly after surgery.  He was hemodynamically stable and doing  well on  postop day #1.  At that time he was able to be transferred to the step-  down unit.  The he has continued to make progress and now has been  transferred to the floor.  He has been mildly volume-overloaded and has  been diuresed back down to below his preoperative weight.  He also has  had mild blood loss anemia and has been started on iron supplements.  He  has not required transfusion.  He has been started on a beta blocker and  a statin.  His incisions are all healing well.  He has remained afebrile  and all vital signs have been stable.  He is ambulating in the halls  cardiac rehab phase one is making good progress.  He is tolerating a  regular diet and is having normal bowel and bladder function.  It is  felt that if he continues to remain stable over the next 48 hours, that  he will hopefully be ready for discharge home on May 27, 2007,  pending  morning round evaluation.  His most recent labs show hemoglobin  8.7, hematocrit 25.2, white count 14.5, platelets 233.  Sodium 139,  potassium 3.8, BUN 8, creatinine 1.05.  He will have repeat labs on the  morning of July 16, 2006.   DISCHARGE MEDICATIONS:  1. Enteric-coated aspirin 325 mg daily.  2. Toprol XL 25 mg daily.  3. Crestor 10 mg q.h.s.  4. Tylox one to two q.4h. p.r.n. for pain.   DISCHARGE INSTRUCTIONS:  He is asked to refrain from driving, heavy  lifting or strenuous activity.  He may continue ambulating daily and  using his incentive spirometer.  He may shower daily and clean his  incisions with soap and water.  He will continue a low-fat, low-sodium  diet.   DISCHARGE FOLLOWUP:  He will see Dr. Laneta Simmers back in the office in 3  weeks with a chest x-ray.  He will need to make an appointment see Dr.  Elsie Lincoln in 2 weeks.  He will see Dr. Sherwood Gambler as directed.  In the interim,  he is asked to contact our office if he experiences any problems or has  questions.      Coral Ceo, P.A.      Evelene Croon, M.D.  Electronically Signed    GC/MEDQ  D:  05/25/2007  T:  05/25/2007  Job:  [id number]   cc:   Madaline Savage, M.D.  Madelin Rear. Sherwood Gambler, MD

## 2011-04-05 LAB — HEPARIN LEVEL (UNFRACTIONATED): Heparin Unfractionated: 0.48

## 2011-04-05 LAB — CBC
HCT: 25.2 — ABNORMAL LOW
HCT: 25.8 — ABNORMAL LOW
HCT: 27.6 — ABNORMAL LOW
HCT: 31 — ABNORMAL LOW
HCT: 40.1
HCT: 41.5
Hemoglobin: 10.5 — ABNORMAL LOW
Hemoglobin: 13.4
Hemoglobin: 13.5
Hemoglobin: 8.9 — ABNORMAL LOW
Hemoglobin: 9.4 — ABNORMAL LOW
MCHC: 33.7
MCV: 91.5
MCV: 92.6
MCV: 93.8
Platelets: 156
Platelets: 220
Platelets: 233
Platelets: 278
Platelets: 314
Platelets: 325
Platelets: 330
RBC: 2.82 — ABNORMAL LOW
RBC: 2.97 — ABNORMAL LOW
RBC: 4.32
RBC: 4.49
RDW: 13.2
RDW: 13.4
RDW: 13.6
RDW: 13.6
WBC: 10.3
WBC: 10.7 — ABNORMAL HIGH
WBC: 12.1 — ABNORMAL HIGH
WBC: 12.8 — ABNORMAL HIGH
WBC: 14.5 — ABNORMAL HIGH
WBC: 18.6 — ABNORMAL HIGH
WBC: 9
WBC: 9.9

## 2011-04-05 LAB — I-STAT EC8
Acid-base deficit: 4 — ABNORMAL HIGH
HCT: 28 — ABNORMAL LOW
Hemoglobin: 9.5 — ABNORMAL LOW
Potassium: 4.4
Sodium: 140
TCO2: 23

## 2011-04-05 LAB — POCT I-STAT CREATININE: Creatinine, Ser: 0.7

## 2011-04-05 LAB — PROTIME-INR
INR: 1
Prothrombin Time: 13.4

## 2011-04-05 LAB — BASIC METABOLIC PANEL
BUN: 17
BUN: 7
BUN: 8
Calcium: 8.6
Calcium: 9.1
Calcium: 9.2
Chloride: 100
Chloride: 104
Creatinine, Ser: 0.77
Creatinine, Ser: 0.83
GFR calc Af Amer: >60
GFR calc non Af Amer: >60
GFR calc non Af Amer: >60
GFR calc non Af Amer: >60
GFR calc non Af Amer: >60
Glucose, Bld: 124 — ABNORMAL HIGH
Potassium: 3.8
Sodium: 136

## 2011-04-05 LAB — CARDIAC PANEL(CRET KIN+CKTOT+MB+TROPI)
CK, MB: 78.9 — ABNORMAL HIGH
Relative Index: 12.2 — ABNORMAL HIGH
Relative Index: INVALID
Total CK: 648 — ABNORMAL HIGH
Total CK: 93
Troponin I: 0.18 — ABNORMAL HIGH

## 2011-04-05 LAB — DIFFERENTIAL
Basophils Absolute: 0
Eosinophils Absolute: 0 — ABNORMAL LOW
Eosinophils Relative: 0
Lymphs Abs: 1.1
Monocytes Relative: 4
Neutro Abs: 8.7 — ABNORMAL HIGH
Neutrophils Relative %: 85 — ABNORMAL HIGH

## 2011-04-05 LAB — POCT I-STAT 4, (NA,K, GLUC, HGB,HCT)
Glucose, Bld: 116 — ABNORMAL HIGH
Glucose, Bld: 119 — ABNORMAL HIGH
Glucose, Bld: 128 — ABNORMAL HIGH
HCT: 28 — ABNORMAL LOW
HCT: 29 — ABNORMAL LOW
Hemoglobin: 11.2 — ABNORMAL LOW
Hemoglobin: 7.1 — CL
Hemoglobin: 7.5 — CL
Hemoglobin: 9.5 — ABNORMAL LOW
Operator id: 203371
Operator id: 3406
Operator id: 3406
Operator id: 3406
Potassium: 3.9
Potassium: 3.9
Potassium: 3.9
Potassium: 5.6 — ABNORMAL HIGH
Sodium: 134 — ABNORMAL LOW
Sodium: 135
Sodium: 137

## 2011-04-05 LAB — MAGNESIUM
Magnesium: 1.7
Magnesium: 2.5
Magnesium: 2.8 — ABNORMAL HIGH

## 2011-04-05 LAB — POCT I-STAT 3, ART BLOOD GAS (G3+)
Acid-base deficit: 2
Acid-base deficit: 4 — ABNORMAL HIGH
Bicarbonate: 20.7
O2 Saturation: 100
Operator id: 203371
Operator id: 298281
TCO2: 22
TCO2: 29
pCO2 arterial: 61.1
pH, Arterial: 7.386
pH, Arterial: 7.48 — ABNORMAL HIGH
pO2, Arterial: 241 — ABNORMAL HIGH
pO2, Arterial: 56 — ABNORMAL LOW
pO2, Arterial: 89

## 2011-04-05 LAB — LIPID PANEL
HDL: 31 — ABNORMAL LOW
VLDL: 34

## 2011-04-05 LAB — APTT
aPTT: 30
aPTT: 33

## 2011-04-05 LAB — URINALYSIS, ROUTINE W REFLEX MICROSCOPIC
Bilirubin Urine: NEGATIVE
Ketones, ur: NEGATIVE
Nitrite: NEGATIVE
Protein, ur: NEGATIVE
Specific Gravity, Urine: 1.042 — ABNORMAL HIGH
Urobilinogen, UA: 0.2

## 2011-04-05 LAB — COMPREHENSIVE METABOLIC PANEL
AST: 16
Albumin: 3.6
Chloride: 106
Glucose, Bld: 121 — ABNORMAL HIGH
Sodium: 140

## 2011-04-05 LAB — BLOOD GAS, ARTERIAL
O2 Content: 2
Patient temperature: 97
TCO2: 26.3
pH, Arterial: 7.386

## 2011-04-05 LAB — TYPE AND SCREEN: Antibody Screen: NEGATIVE

## 2011-04-05 LAB — HEMOGLOBIN A1C
Hgb A1c MFr Bld: 5.7
Mean Plasma Glucose: 126

## 2011-04-05 LAB — TSH: TSH: 5.033

## 2011-04-05 LAB — ABO/RH: ABO/RH(D): A POS

## 2011-04-05 LAB — URINE MICROSCOPIC-ADD ON

## 2011-04-05 LAB — I-STAT 8, (EC8 V) (CONVERTED LAB): HCT: 41

## 2011-04-05 LAB — CREATININE, SERUM: GFR calc non Af Amer: >60

## 2012-02-03 DIAGNOSIS — I10 Essential (primary) hypertension: Secondary | ICD-10-CM | POA: Diagnosis not present

## 2012-02-03 DIAGNOSIS — E785 Hyperlipidemia, unspecified: Secondary | ICD-10-CM | POA: Diagnosis not present

## 2012-02-03 DIAGNOSIS — L4050 Arthropathic psoriasis, unspecified: Secondary | ICD-10-CM | POA: Diagnosis not present

## 2012-02-03 DIAGNOSIS — Z6831 Body mass index (BMI) 31.0-31.9, adult: Secondary | ICD-10-CM | POA: Diagnosis not present

## 2012-02-07 DIAGNOSIS — E785 Hyperlipidemia, unspecified: Secondary | ICD-10-CM | POA: Diagnosis not present

## 2012-02-07 DIAGNOSIS — I10 Essential (primary) hypertension: Secondary | ICD-10-CM | POA: Diagnosis not present

## 2012-02-07 DIAGNOSIS — Z125 Encounter for screening for malignant neoplasm of prostate: Secondary | ICD-10-CM | POA: Diagnosis not present

## 2012-02-07 DIAGNOSIS — E039 Hypothyroidism, unspecified: Secondary | ICD-10-CM | POA: Diagnosis not present

## 2012-05-22 DIAGNOSIS — Z23 Encounter for immunization: Secondary | ICD-10-CM | POA: Diagnosis not present

## 2012-12-11 DIAGNOSIS — H2510 Age-related nuclear cataract, unspecified eye: Secondary | ICD-10-CM | POA: Diagnosis not present

## 2012-12-27 DIAGNOSIS — H269 Unspecified cataract: Secondary | ICD-10-CM | POA: Diagnosis not present

## 2012-12-27 DIAGNOSIS — H52209 Unspecified astigmatism, unspecified eye: Secondary | ICD-10-CM | POA: Diagnosis not present

## 2012-12-27 DIAGNOSIS — H2510 Age-related nuclear cataract, unspecified eye: Secondary | ICD-10-CM | POA: Diagnosis not present

## 2013-01-08 DIAGNOSIS — H2510 Age-related nuclear cataract, unspecified eye: Secondary | ICD-10-CM | POA: Diagnosis not present

## 2013-01-10 DIAGNOSIS — H2510 Age-related nuclear cataract, unspecified eye: Secondary | ICD-10-CM | POA: Diagnosis not present

## 2013-01-10 DIAGNOSIS — H52209 Unspecified astigmatism, unspecified eye: Secondary | ICD-10-CM | POA: Diagnosis not present

## 2013-01-10 DIAGNOSIS — H269 Unspecified cataract: Secondary | ICD-10-CM | POA: Diagnosis not present

## 2013-02-18 DIAGNOSIS — E039 Hypothyroidism, unspecified: Secondary | ICD-10-CM | POA: Diagnosis not present

## 2013-02-18 DIAGNOSIS — E785 Hyperlipidemia, unspecified: Secondary | ICD-10-CM | POA: Diagnosis not present

## 2013-02-18 DIAGNOSIS — Z6829 Body mass index (BMI) 29.0-29.9, adult: Secondary | ICD-10-CM | POA: Diagnosis not present

## 2013-02-18 DIAGNOSIS — Z23 Encounter for immunization: Secondary | ICD-10-CM | POA: Diagnosis not present

## 2013-02-18 DIAGNOSIS — I10 Essential (primary) hypertension: Secondary | ICD-10-CM | POA: Diagnosis not present

## 2013-02-19 DIAGNOSIS — Z23 Encounter for immunization: Secondary | ICD-10-CM | POA: Diagnosis not present

## 2013-02-19 DIAGNOSIS — Z79899 Other long term (current) drug therapy: Secondary | ICD-10-CM | POA: Diagnosis not present

## 2013-02-19 DIAGNOSIS — Z125 Encounter for screening for malignant neoplasm of prostate: Secondary | ICD-10-CM | POA: Diagnosis not present

## 2014-02-14 DIAGNOSIS — E039 Hypothyroidism, unspecified: Secondary | ICD-10-CM | POA: Diagnosis not present

## 2014-02-14 DIAGNOSIS — L408 Other psoriasis: Secondary | ICD-10-CM | POA: Diagnosis not present

## 2014-02-14 DIAGNOSIS — Z6829 Body mass index (BMI) 29.0-29.9, adult: Secondary | ICD-10-CM | POA: Diagnosis not present

## 2014-02-14 DIAGNOSIS — I10 Essential (primary) hypertension: Secondary | ICD-10-CM | POA: Diagnosis not present

## 2014-02-14 DIAGNOSIS — E785 Hyperlipidemia, unspecified: Secondary | ICD-10-CM | POA: Diagnosis not present

## 2014-02-20 DIAGNOSIS — E785 Hyperlipidemia, unspecified: Secondary | ICD-10-CM | POA: Diagnosis not present

## 2014-02-20 DIAGNOSIS — Z125 Encounter for screening for malignant neoplasm of prostate: Secondary | ICD-10-CM | POA: Diagnosis not present

## 2014-02-20 DIAGNOSIS — I10 Essential (primary) hypertension: Secondary | ICD-10-CM | POA: Diagnosis not present

## 2014-02-20 DIAGNOSIS — E039 Hypothyroidism, unspecified: Secondary | ICD-10-CM | POA: Diagnosis not present

## 2014-02-21 DIAGNOSIS — E119 Type 2 diabetes mellitus without complications: Secondary | ICD-10-CM | POA: Diagnosis not present

## 2014-07-18 DIAGNOSIS — Z23 Encounter for immunization: Secondary | ICD-10-CM | POA: Diagnosis not present

## 2015-02-12 DIAGNOSIS — E063 Autoimmune thyroiditis: Secondary | ICD-10-CM | POA: Diagnosis not present

## 2015-02-12 DIAGNOSIS — M1991 Primary osteoarthritis, unspecified site: Secondary | ICD-10-CM | POA: Diagnosis not present

## 2015-02-12 DIAGNOSIS — Z6829 Body mass index (BMI) 29.0-29.9, adult: Secondary | ICD-10-CM | POA: Diagnosis not present

## 2015-02-12 DIAGNOSIS — Z1389 Encounter for screening for other disorder: Secondary | ICD-10-CM | POA: Diagnosis not present

## 2015-02-12 DIAGNOSIS — I10 Essential (primary) hypertension: Secondary | ICD-10-CM | POA: Diagnosis not present

## 2015-02-12 DIAGNOSIS — E1165 Type 2 diabetes mellitus with hyperglycemia: Secondary | ICD-10-CM | POA: Diagnosis not present

## 2015-02-12 DIAGNOSIS — E669 Obesity, unspecified: Secondary | ICD-10-CM | POA: Diagnosis not present

## 2015-02-12 DIAGNOSIS — E663 Overweight: Secondary | ICD-10-CM | POA: Diagnosis not present

## 2015-02-12 DIAGNOSIS — Z23 Encounter for immunization: Secondary | ICD-10-CM | POA: Diagnosis not present

## 2015-02-20 DIAGNOSIS — Z125 Encounter for screening for malignant neoplasm of prostate: Secondary | ICD-10-CM | POA: Diagnosis not present

## 2015-02-20 DIAGNOSIS — Z6829 Body mass index (BMI) 29.0-29.9, adult: Secondary | ICD-10-CM | POA: Diagnosis not present

## 2015-02-20 DIAGNOSIS — E063 Autoimmune thyroiditis: Secondary | ICD-10-CM | POA: Diagnosis not present

## 2015-02-20 DIAGNOSIS — E782 Mixed hyperlipidemia: Secondary | ICD-10-CM | POA: Diagnosis not present

## 2015-02-20 DIAGNOSIS — Z1389 Encounter for screening for other disorder: Secondary | ICD-10-CM | POA: Diagnosis not present

## 2015-02-20 DIAGNOSIS — E663 Overweight: Secondary | ICD-10-CM | POA: Diagnosis not present

## 2015-02-20 DIAGNOSIS — E119 Type 2 diabetes mellitus without complications: Secondary | ICD-10-CM | POA: Diagnosis not present

## 2015-06-11 DIAGNOSIS — Z23 Encounter for immunization: Secondary | ICD-10-CM | POA: Diagnosis not present

## 2016-02-11 DIAGNOSIS — L4050 Arthropathic psoriasis, unspecified: Secondary | ICD-10-CM | POA: Diagnosis not present

## 2016-02-11 DIAGNOSIS — Z6829 Body mass index (BMI) 29.0-29.9, adult: Secondary | ICD-10-CM | POA: Diagnosis not present

## 2016-02-11 DIAGNOSIS — I10 Essential (primary) hypertension: Secondary | ICD-10-CM | POA: Diagnosis not present

## 2016-02-11 DIAGNOSIS — Z1389 Encounter for screening for other disorder: Secondary | ICD-10-CM | POA: Diagnosis not present

## 2016-02-11 DIAGNOSIS — E119 Type 2 diabetes mellitus without complications: Secondary | ICD-10-CM | POA: Diagnosis not present

## 2016-03-15 DIAGNOSIS — E663 Overweight: Secondary | ICD-10-CM | POA: Diagnosis not present

## 2016-03-15 DIAGNOSIS — Z6829 Body mass index (BMI) 29.0-29.9, adult: Secondary | ICD-10-CM | POA: Diagnosis not present

## 2016-03-15 DIAGNOSIS — E119 Type 2 diabetes mellitus without complications: Secondary | ICD-10-CM | POA: Diagnosis not present

## 2016-03-15 DIAGNOSIS — E063 Autoimmune thyroiditis: Secondary | ICD-10-CM | POA: Diagnosis not present

## 2016-03-15 DIAGNOSIS — Z1211 Encounter for screening for malignant neoplasm of colon: Secondary | ICD-10-CM | POA: Diagnosis not present

## 2016-03-15 DIAGNOSIS — Z125 Encounter for screening for malignant neoplasm of prostate: Secondary | ICD-10-CM | POA: Diagnosis not present

## 2016-03-15 DIAGNOSIS — E1165 Type 2 diabetes mellitus with hyperglycemia: Secondary | ICD-10-CM | POA: Diagnosis not present

## 2016-03-15 DIAGNOSIS — Z1389 Encounter for screening for other disorder: Secondary | ICD-10-CM | POA: Diagnosis not present

## 2016-06-03 DIAGNOSIS — Z23 Encounter for immunization: Secondary | ICD-10-CM | POA: Diagnosis not present

## 2017-02-23 DIAGNOSIS — Z1389 Encounter for screening for other disorder: Secondary | ICD-10-CM | POA: Diagnosis not present

## 2017-02-23 DIAGNOSIS — Z6828 Body mass index (BMI) 28.0-28.9, adult: Secondary | ICD-10-CM | POA: Diagnosis not present

## 2017-02-23 DIAGNOSIS — Z0001 Encounter for general adult medical examination with abnormal findings: Secondary | ICD-10-CM | POA: Diagnosis not present

## 2017-02-23 DIAGNOSIS — E663 Overweight: Secondary | ICD-10-CM | POA: Diagnosis not present

## 2017-02-23 DIAGNOSIS — I2510 Atherosclerotic heart disease of native coronary artery without angina pectoris: Secondary | ICD-10-CM | POA: Diagnosis not present

## 2017-02-23 DIAGNOSIS — E1165 Type 2 diabetes mellitus with hyperglycemia: Secondary | ICD-10-CM | POA: Diagnosis not present

## 2017-02-23 DIAGNOSIS — E063 Autoimmune thyroiditis: Secondary | ICD-10-CM | POA: Diagnosis not present

## 2017-02-23 DIAGNOSIS — I10 Essential (primary) hypertension: Secondary | ICD-10-CM | POA: Diagnosis not present

## 2017-03-29 DIAGNOSIS — Z1211 Encounter for screening for malignant neoplasm of colon: Secondary | ICD-10-CM | POA: Diagnosis not present

## 2017-06-15 DIAGNOSIS — R946 Abnormal results of thyroid function studies: Secondary | ICD-10-CM | POA: Diagnosis not present

## 2017-06-15 DIAGNOSIS — Z125 Encounter for screening for malignant neoplasm of prostate: Secondary | ICD-10-CM | POA: Diagnosis not present

## 2017-06-15 DIAGNOSIS — E663 Overweight: Secondary | ICD-10-CM | POA: Diagnosis not present

## 2017-06-15 DIAGNOSIS — Z6828 Body mass index (BMI) 28.0-28.9, adult: Secondary | ICD-10-CM | POA: Diagnosis not present

## 2017-06-15 DIAGNOSIS — Z1389 Encounter for screening for other disorder: Secondary | ICD-10-CM | POA: Diagnosis not present

## 2017-06-15 DIAGNOSIS — Z23 Encounter for immunization: Secondary | ICD-10-CM | POA: Diagnosis not present

## 2018-02-28 DIAGNOSIS — E785 Hyperlipidemia, unspecified: Secondary | ICD-10-CM | POA: Diagnosis not present

## 2018-02-28 DIAGNOSIS — Z0001 Encounter for general adult medical examination with abnormal findings: Secondary | ICD-10-CM | POA: Diagnosis not present

## 2018-02-28 DIAGNOSIS — R946 Abnormal results of thyroid function studies: Secondary | ICD-10-CM | POA: Diagnosis not present

## 2018-02-28 DIAGNOSIS — Z1389 Encounter for screening for other disorder: Secondary | ICD-10-CM | POA: Diagnosis not present

## 2018-02-28 DIAGNOSIS — L84 Corns and callosities: Secondary | ICD-10-CM | POA: Diagnosis not present

## 2018-02-28 DIAGNOSIS — R201 Hypoesthesia of skin: Secondary | ICD-10-CM | POA: Diagnosis not present

## 2018-02-28 DIAGNOSIS — E1140 Type 2 diabetes mellitus with diabetic neuropathy, unspecified: Secondary | ICD-10-CM | POA: Diagnosis not present

## 2018-02-28 DIAGNOSIS — E663 Overweight: Secondary | ICD-10-CM | POA: Diagnosis not present

## 2018-02-28 DIAGNOSIS — Z6828 Body mass index (BMI) 28.0-28.9, adult: Secondary | ICD-10-CM | POA: Diagnosis not present

## 2018-02-28 DIAGNOSIS — Z125 Encounter for screening for malignant neoplasm of prostate: Secondary | ICD-10-CM | POA: Diagnosis not present

## 2018-02-28 DIAGNOSIS — I2510 Atherosclerotic heart disease of native coronary artery without angina pectoris: Secondary | ICD-10-CM | POA: Diagnosis not present

## 2018-02-28 DIAGNOSIS — I10 Essential (primary) hypertension: Secondary | ICD-10-CM | POA: Diagnosis not present

## 2018-02-28 DIAGNOSIS — E119 Type 2 diabetes mellitus without complications: Secondary | ICD-10-CM | POA: Diagnosis not present

## 2018-03-12 DIAGNOSIS — Z1211 Encounter for screening for malignant neoplasm of colon: Secondary | ICD-10-CM | POA: Diagnosis not present

## 2018-05-22 DIAGNOSIS — Z23 Encounter for immunization: Secondary | ICD-10-CM | POA: Diagnosis not present

## 2018-07-11 DIAGNOSIS — B351 Tinea unguium: Secondary | ICD-10-CM | POA: Diagnosis not present

## 2018-07-11 DIAGNOSIS — M79671 Pain in right foot: Secondary | ICD-10-CM | POA: Diagnosis not present

## 2018-07-11 DIAGNOSIS — M79675 Pain in left toe(s): Secondary | ICD-10-CM | POA: Diagnosis not present

## 2018-07-11 DIAGNOSIS — M79672 Pain in left foot: Secondary | ICD-10-CM | POA: Diagnosis not present

## 2018-07-11 DIAGNOSIS — L110 Acquired keratosis follicularis: Secondary | ICD-10-CM | POA: Diagnosis not present

## 2018-07-11 DIAGNOSIS — M79674 Pain in right toe(s): Secondary | ICD-10-CM | POA: Diagnosis not present

## 2019-01-28 DIAGNOSIS — Z1389 Encounter for screening for other disorder: Secondary | ICD-10-CM | POA: Diagnosis not present

## 2019-01-28 DIAGNOSIS — E1165 Type 2 diabetes mellitus with hyperglycemia: Secondary | ICD-10-CM | POA: Diagnosis not present

## 2019-01-28 DIAGNOSIS — Z6829 Body mass index (BMI) 29.0-29.9, adult: Secondary | ICD-10-CM | POA: Diagnosis not present

## 2019-01-28 DIAGNOSIS — M1991 Primary osteoarthritis, unspecified site: Secondary | ICD-10-CM | POA: Diagnosis not present

## 2019-01-28 DIAGNOSIS — I10 Essential (primary) hypertension: Secondary | ICD-10-CM | POA: Diagnosis not present

## 2019-01-28 DIAGNOSIS — E063 Autoimmune thyroiditis: Secondary | ICD-10-CM | POA: Diagnosis not present

## 2019-03-18 DIAGNOSIS — Z6829 Body mass index (BMI) 29.0-29.9, adult: Secondary | ICD-10-CM | POA: Diagnosis not present

## 2019-03-18 DIAGNOSIS — Z1389 Encounter for screening for other disorder: Secondary | ICD-10-CM | POA: Diagnosis not present

## 2019-03-18 DIAGNOSIS — E119 Type 2 diabetes mellitus without complications: Secondary | ICD-10-CM | POA: Diagnosis not present

## 2019-03-18 DIAGNOSIS — E663 Overweight: Secondary | ICD-10-CM | POA: Diagnosis not present

## 2019-03-18 DIAGNOSIS — Z0001 Encounter for general adult medical examination with abnormal findings: Secondary | ICD-10-CM | POA: Diagnosis not present

## 2019-06-07 DIAGNOSIS — Z23 Encounter for immunization: Secondary | ICD-10-CM | POA: Diagnosis not present

## 2019-10-04 DIAGNOSIS — Z23 Encounter for immunization: Secondary | ICD-10-CM | POA: Diagnosis not present

## 2019-11-19 ENCOUNTER — Other Ambulatory Visit (HOSPITAL_COMMUNITY): Payer: Self-pay | Admitting: Internal Medicine

## 2019-11-19 DIAGNOSIS — I10 Essential (primary) hypertension: Secondary | ICD-10-CM | POA: Diagnosis not present

## 2019-11-19 DIAGNOSIS — M1990 Unspecified osteoarthritis, unspecified site: Secondary | ICD-10-CM | POA: Diagnosis not present

## 2019-11-19 DIAGNOSIS — L97522 Non-pressure chronic ulcer of other part of left foot with fat layer exposed: Secondary | ICD-10-CM | POA: Diagnosis not present

## 2019-11-19 DIAGNOSIS — B351 Tinea unguium: Secondary | ICD-10-CM | POA: Diagnosis not present

## 2019-11-19 DIAGNOSIS — Z1389 Encounter for screening for other disorder: Secondary | ICD-10-CM | POA: Diagnosis not present

## 2019-11-19 DIAGNOSIS — E1165 Type 2 diabetes mellitus with hyperglycemia: Secondary | ICD-10-CM | POA: Diagnosis not present

## 2019-11-19 DIAGNOSIS — R0989 Other specified symptoms and signs involving the circulatory and respiratory systems: Secondary | ICD-10-CM | POA: Diagnosis not present

## 2019-11-19 DIAGNOSIS — L84 Corns and callosities: Secondary | ICD-10-CM | POA: Diagnosis not present

## 2019-11-19 DIAGNOSIS — E663 Overweight: Secondary | ICD-10-CM | POA: Diagnosis not present

## 2019-11-19 DIAGNOSIS — R201 Hypoesthesia of skin: Secondary | ICD-10-CM | POA: Diagnosis not present

## 2019-11-19 DIAGNOSIS — Z6829 Body mass index (BMI) 29.0-29.9, adult: Secondary | ICD-10-CM | POA: Diagnosis not present

## 2019-12-06 DIAGNOSIS — I10 Essential (primary) hypertension: Secondary | ICD-10-CM | POA: Diagnosis not present

## 2019-12-06 DIAGNOSIS — E1349 Other specified diabetes mellitus with other diabetic neurological complication: Secondary | ICD-10-CM | POA: Diagnosis not present

## 2019-12-06 DIAGNOSIS — E785 Hyperlipidemia, unspecified: Secondary | ICD-10-CM | POA: Diagnosis not present

## 2019-12-06 DIAGNOSIS — I739 Peripheral vascular disease, unspecified: Secondary | ICD-10-CM | POA: Diagnosis not present

## 2019-12-06 DIAGNOSIS — E1140 Type 2 diabetes mellitus with diabetic neuropathy, unspecified: Secondary | ICD-10-CM | POA: Diagnosis not present

## 2019-12-10 DIAGNOSIS — M1990 Unspecified osteoarthritis, unspecified site: Secondary | ICD-10-CM | POA: Diagnosis not present

## 2019-12-10 DIAGNOSIS — I739 Peripheral vascular disease, unspecified: Secondary | ICD-10-CM | POA: Diagnosis not present

## 2019-12-10 DIAGNOSIS — L110 Acquired keratosis follicularis: Secondary | ICD-10-CM | POA: Diagnosis not present

## 2020-03-24 DIAGNOSIS — E119 Type 2 diabetes mellitus without complications: Secondary | ICD-10-CM | POA: Diagnosis not present

## 2020-03-24 DIAGNOSIS — Z6829 Body mass index (BMI) 29.0-29.9, adult: Secondary | ICD-10-CM | POA: Diagnosis not present

## 2020-03-24 DIAGNOSIS — I739 Peripheral vascular disease, unspecified: Secondary | ICD-10-CM | POA: Diagnosis not present

## 2020-03-24 DIAGNOSIS — E118 Type 2 diabetes mellitus with unspecified complications: Secondary | ICD-10-CM | POA: Diagnosis not present

## 2020-03-24 DIAGNOSIS — I10 Essential (primary) hypertension: Secondary | ICD-10-CM | POA: Diagnosis not present

## 2020-03-24 DIAGNOSIS — Z0001 Encounter for general adult medical examination with abnormal findings: Secondary | ICD-10-CM | POA: Diagnosis not present

## 2020-04-06 ENCOUNTER — Other Ambulatory Visit: Payer: Self-pay

## 2020-04-06 DIAGNOSIS — L89892 Pressure ulcer of other site, stage 2: Secondary | ICD-10-CM | POA: Diagnosis not present

## 2020-04-06 DIAGNOSIS — M79672 Pain in left foot: Secondary | ICD-10-CM | POA: Diagnosis not present

## 2020-04-06 DIAGNOSIS — I739 Peripheral vascular disease, unspecified: Secondary | ICD-10-CM | POA: Diagnosis not present

## 2020-04-06 DIAGNOSIS — M79675 Pain in left toe(s): Secondary | ICD-10-CM | POA: Diagnosis not present

## 2020-04-06 DIAGNOSIS — E1140 Type 2 diabetes mellitus with diabetic neuropathy, unspecified: Secondary | ICD-10-CM | POA: Diagnosis not present

## 2020-04-21 ENCOUNTER — Other Ambulatory Visit: Payer: Self-pay | Admitting: *Deleted

## 2020-04-21 DIAGNOSIS — I70219 Atherosclerosis of native arteries of extremities with intermittent claudication, unspecified extremity: Secondary | ICD-10-CM

## 2020-05-11 ENCOUNTER — Encounter: Payer: Medicare Other | Admitting: Vascular Surgery

## 2020-11-17 ENCOUNTER — Emergency Department (HOSPITAL_COMMUNITY): Payer: Medicare Other

## 2020-11-17 ENCOUNTER — Observation Stay (HOSPITAL_COMMUNITY)
Admission: EM | Admit: 2020-11-17 | Discharge: 2020-11-18 | Disposition: A | Discharge status: Home / Self Care | Payer: Medicare Other | Attending: Internal Medicine | Admitting: Internal Medicine

## 2020-11-17 ENCOUNTER — Encounter (HOSPITAL_COMMUNITY): Payer: Self-pay | Admitting: Emergency Medicine

## 2020-11-17 ENCOUNTER — Other Ambulatory Visit: Payer: Self-pay

## 2020-11-17 DIAGNOSIS — E1149 Type 2 diabetes mellitus with other diabetic neurological complication: Secondary | ICD-10-CM

## 2020-11-17 DIAGNOSIS — N059 Unspecified nephritic syndrome with unspecified morphologic changes: Secondary | ICD-10-CM | POA: Diagnosis not present

## 2020-11-17 DIAGNOSIS — Z7984 Long term (current) use of oral hypoglycemic drugs: Secondary | ICD-10-CM | POA: Diagnosis not present

## 2020-11-17 DIAGNOSIS — K5730 Diverticulosis of large intestine without perforation or abscess without bleeding: Secondary | ICD-10-CM | POA: Diagnosis not present

## 2020-11-17 DIAGNOSIS — N1330 Unspecified hydronephrosis: Secondary | ICD-10-CM | POA: Diagnosis not present

## 2020-11-17 DIAGNOSIS — N390 Urinary tract infection, site not specified: Secondary | ICD-10-CM | POA: Diagnosis present

## 2020-11-17 DIAGNOSIS — E440 Moderate protein-calorie malnutrition: Secondary | ICD-10-CM | POA: Diagnosis present

## 2020-11-17 DIAGNOSIS — E1165 Type 2 diabetes mellitus with hyperglycemia: Secondary | ICD-10-CM | POA: Diagnosis not present

## 2020-11-17 DIAGNOSIS — R42 Dizziness and giddiness: Secondary | ICD-10-CM | POA: Diagnosis not present

## 2020-11-17 DIAGNOSIS — Z20822 Contact with and (suspected) exposure to covid-19: Secondary | ICD-10-CM | POA: Diagnosis not present

## 2020-11-17 DIAGNOSIS — Z79899 Other long term (current) drug therapy: Secondary | ICD-10-CM | POA: Diagnosis not present

## 2020-11-17 DIAGNOSIS — Z87891 Personal history of nicotine dependence: Secondary | ICD-10-CM | POA: Diagnosis not present

## 2020-11-17 DIAGNOSIS — I639 Cerebral infarction, unspecified: Secondary | ICD-10-CM | POA: Diagnosis not present

## 2020-11-17 DIAGNOSIS — Z28311 Partially vaccinated for covid-19: Secondary | ICD-10-CM | POA: Diagnosis not present

## 2020-11-17 DIAGNOSIS — N3000 Acute cystitis without hematuria: Secondary | ICD-10-CM

## 2020-11-17 DIAGNOSIS — E441 Mild protein-calorie malnutrition: Secondary | ICD-10-CM | POA: Diagnosis not present

## 2020-11-17 DIAGNOSIS — N200 Calculus of kidney: Secondary | ICD-10-CM | POA: Diagnosis not present

## 2020-11-17 DIAGNOSIS — R000 Tachycardia, unspecified: Secondary | ICD-10-CM | POA: Diagnosis not present

## 2020-11-17 DIAGNOSIS — I2510 Atherosclerotic heart disease of native coronary artery without angina pectoris: Secondary | ICD-10-CM | POA: Insufficient documentation

## 2020-11-17 DIAGNOSIS — I10 Essential (primary) hypertension: Secondary | ICD-10-CM | POA: Diagnosis present

## 2020-11-17 DIAGNOSIS — E119 Type 2 diabetes mellitus without complications: Secondary | ICD-10-CM | POA: Diagnosis not present

## 2020-11-17 DIAGNOSIS — B9689 Other specified bacterial agents as the cause of diseases classified elsewhere: Secondary | ICD-10-CM | POA: Diagnosis not present

## 2020-11-17 DIAGNOSIS — E039 Hypothyroidism, unspecified: Secondary | ICD-10-CM | POA: Diagnosis not present

## 2020-11-17 DIAGNOSIS — E785 Hyperlipidemia, unspecified: Secondary | ICD-10-CM

## 2020-11-17 DIAGNOSIS — E079 Disorder of thyroid, unspecified: Secondary | ICD-10-CM | POA: Diagnosis present

## 2020-11-17 LAB — URINALYSIS, ROUTINE W REFLEX MICROSCOPIC
Bilirubin Urine: NEGATIVE
Glucose, UA: >=500 mg/dL — AB
Ketones, ur: 20 mg/dL — AB
Nitrite: NEGATIVE
Protein, ur: 100 mg/dL — AB
Specific Gravity, Urine: 1.014 (ref 1.005–1.030)
WBC, UA: >50 WBC/hpf — ABNORMAL HIGH (ref 0–5)
pH: 5 (ref 5.0–8.0)

## 2020-11-17 LAB — CBC WITH DIFFERENTIAL/PLATELET
Abs Immature Granulocytes: 0.12 10*3/uL — ABNORMAL HIGH (ref 0.00–0.07)
Basophils Absolute: 0.1 10*3/uL (ref 0.0–0.1)
Basophils Relative: 0 %
Eosinophils Absolute: 0 10*3/uL (ref 0.0–0.5)
Eosinophils Relative: 0 %
HCT: 39.6 % (ref 39.0–52.0)
Hemoglobin: 13.4 g/dL (ref 13.0–17.0)
Immature Granulocytes: 1 %
Lymphocytes Relative: 5 %
Lymphs Abs: 0.9 10*3/uL (ref 0.7–4.0)
MCH: 32.3 pg (ref 26.0–34.0)
MCHC: 33.8 g/dL (ref 30.0–36.0)
MCV: 95.4 fL (ref 80.0–100.0)
Monocytes Absolute: 1.3 10*3/uL — ABNORMAL HIGH (ref 0.1–1.0)
Monocytes Relative: 7 %
Neutro Abs: 15.8 10*3/uL — ABNORMAL HIGH (ref 1.7–7.7)
Neutrophils Relative %: 87 %
Platelets: 327 10*3/uL (ref 150–400)
RBC: 4.15 MIL/uL — ABNORMAL LOW (ref 4.22–5.81)
RDW: 12.1 % (ref 11.5–15.5)
WBC: 18.1 10*3/uL — ABNORMAL HIGH (ref 4.0–10.5)
nRBC: 0 % (ref 0.0–0.2)

## 2020-11-17 LAB — COMPREHENSIVE METABOLIC PANEL
ALT: 20 U/L (ref 0–44)
AST: 14 U/L — ABNORMAL LOW (ref 15–41)
Albumin: 3.1 g/dL — ABNORMAL LOW (ref 3.5–5.0)
Alkaline Phosphatase: 112 U/L (ref 38–126)
Anion gap: 12 (ref 5–15)
BUN: 20 mg/dL (ref 8–23)
CO2: 23 mmol/L (ref 22–32)
Calcium: 9.3 mg/dL (ref 8.9–10.3)
Chloride: 98 mmol/L (ref 98–111)
Creatinine, Ser: 1.11 mg/dL (ref 0.61–1.24)
GFR, Estimated: >60 mL/min (ref >60)
Glucose, Bld: 346 mg/dL — ABNORMAL HIGH (ref 70–99)
Potassium: 4.4 mmol/L (ref 3.5–5.1)
Sodium: 133 mmol/L — ABNORMAL LOW (ref 135–145)
Total Bilirubin: 0.9 mg/dL (ref 0.3–1.2)
Total Protein: 7.5 g/dL (ref 6.5–8.1)

## 2020-11-17 LAB — GLUCOSE, CAPILLARY: Glucose-Capillary: 278 mg/dL — ABNORMAL HIGH (ref 70–99)

## 2020-11-17 IMAGING — MR MR HEAD W/O CM
18 of 20 series · 28 of 48 positions shown · non-contrast
Comparison: No pertinent prior exam.
COMPARISON: Head CT same day.

CLINICAL DATA: Acute presentation with dizziness.

EXAM:
MRI HEAD WITHOUT CONTRAST
MRA HEAD WITHOUT CONTRAST
TECHNIQUE: Multiplanar, multi-echo pulse sequences of the brain and surrounding
structures were acquired without intravenous contrast. Angiographic
images of the Circle of Willis were acquired using MRA technique
without intravenous contrast.

[Series 5: DWI · axial · 4.0mm · 0.88mm/px · z∈[-31,+107]mm · 2 of 36 slices shown (1 of 6)]
[im 1/36]
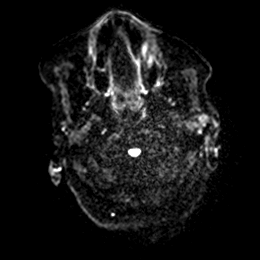
[im 36/36]
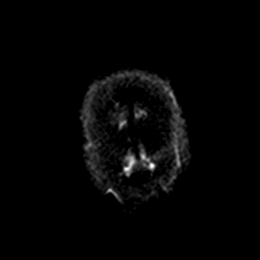

[Series 5: DWI · axial · 4.0mm · 0.88mm/px · z∈[-31,+107]mm · 2 of 36 slices shown (2 of 6)]
[im 1/36]
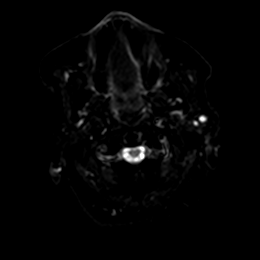
[im 36/36]
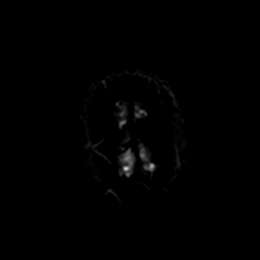

[Series 6: DWI · axial · 4.0mm · 0.88mm/px · z∈[-31,+107]mm · 2 of 36 slices shown (3 of 6)]
[im 1/36]
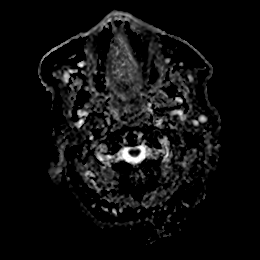
[im 36/36]
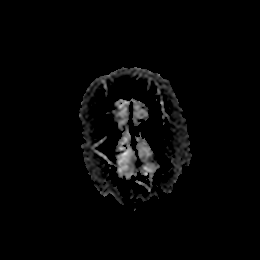

[Series 7: DWI · coronal · 5.0mm · 0.88mm/px · 2 of 28 slices shown (4 of 6)]
[im 1/28]
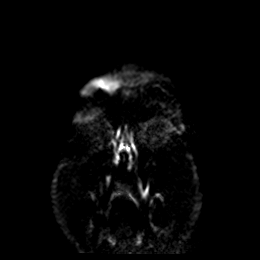
[im 28/28]
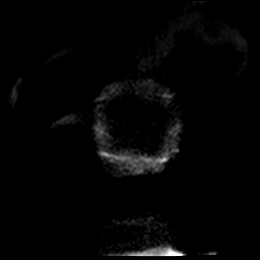

[Series 7: DWI · coronal · 5.0mm · 0.88mm/px · 2 of 28 slices shown (5 of 6)]
[im 1/28]
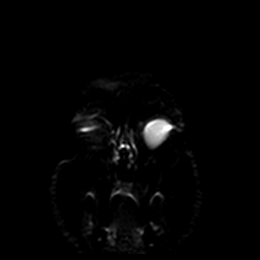
[im 28/28]
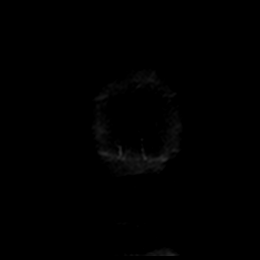

[Series 8: DWI · coronal · 5.0mm · 0.88mm/px · 2 of 28 slices shown (6 of 6)]
[im 1/28]
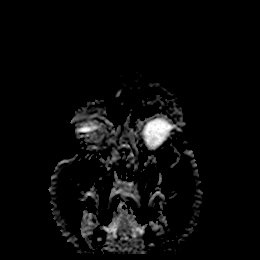
[im 28/28]
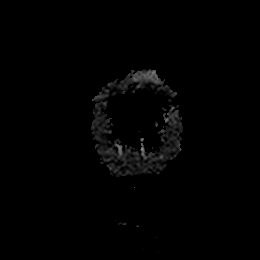

[Series 9: T1 · sagittal · 5.0mm · 0.94mm/px · 2 of 24 slices shown]
[im 1/24]
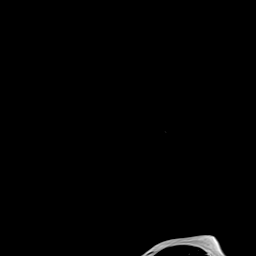
[im 24/24]
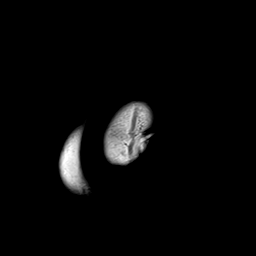

[Series 10: T2 · axial · 5.0mm · 0.72mm/px · 1 of 20 slices shown (1 of 2)]
[im 1/20]
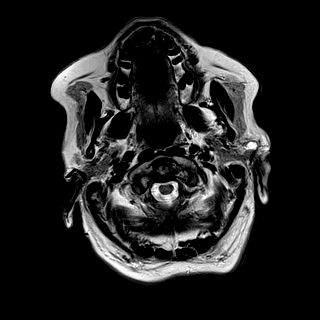

[Series 11: ax hemo · axial · 5.0mm · 0.86mm/px · z∈[-36,+106]mm · 2 of 25 slices shown]
[im 1/25]
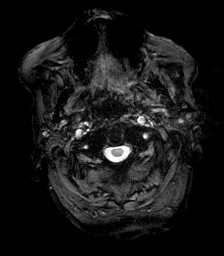
[im 25/25]
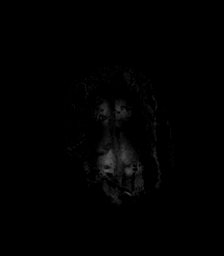

[Series 17: FLAIR · axial · 4.0mm · 0.43mm/px · z∈[-26,+96]mm · 2 of 32 slices shown]
[im 1/32]
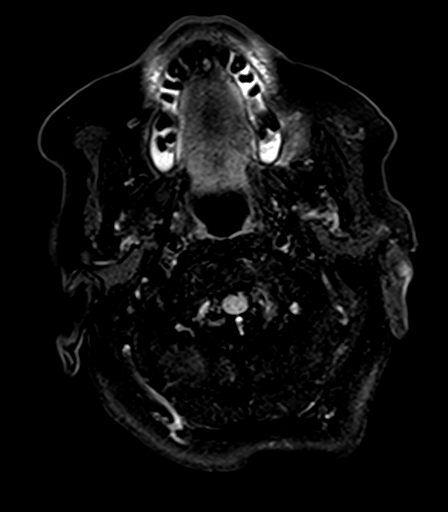
[im 32/32]
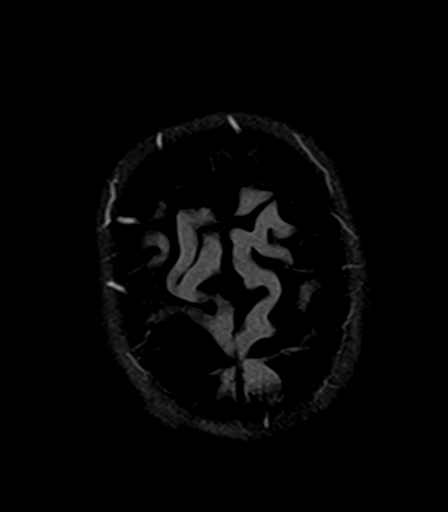

[Series 19: T2 · coronal · 5.0mm · 0.72mm/px · 2 of 28 slices shown (2 of 2)]
[im 1/28]
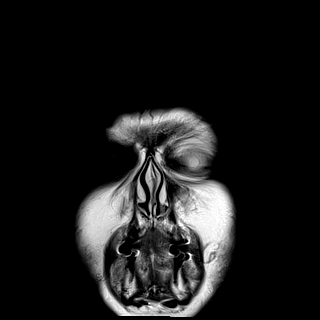
[im 28/28]
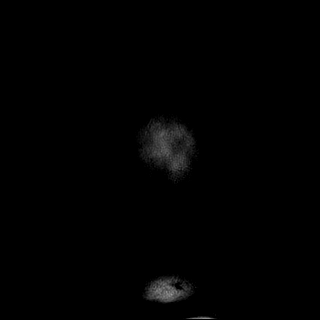

[Series 1022: tumble · 0.34mm/px · 1 of 4 slices shown (1 of 3)]
[im 1/4]
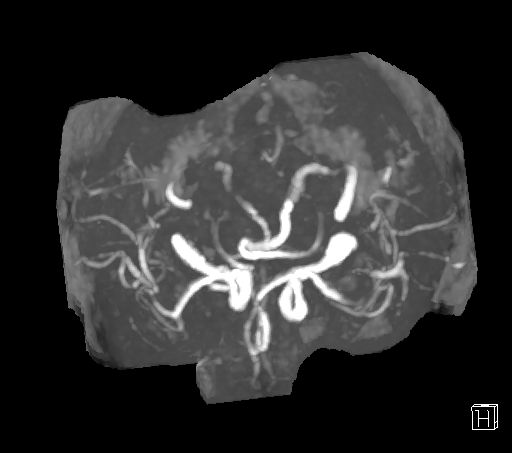

[Series 1026: rotate · 0.34mm/px · 1 of 4 slices shown (1 of 4)]
[im 1/4]
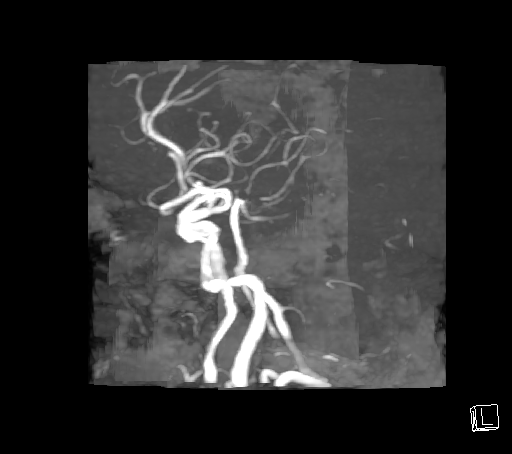

[Series 1031: tumble · 0.34mm/px · 1 of 5 slices shown (2 of 3)]
[im 1/5]
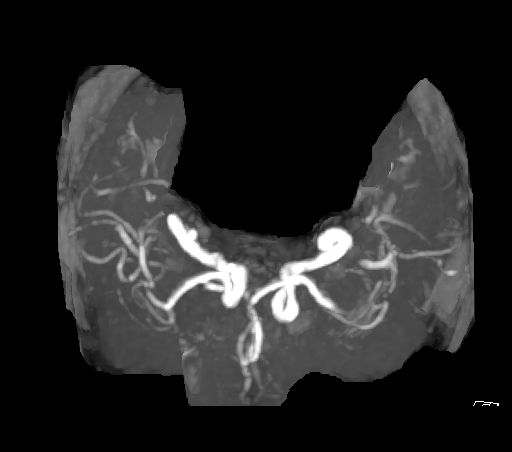

[Series 1035: rotate · 0.34mm/px · 1 of 7 slices shown (2 of 4)]
[im 1/7]
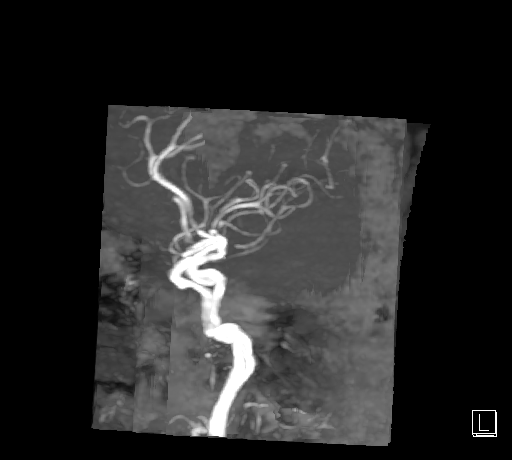

[Series 1040: tumble · 0.34mm/px · 1 of 19 slices shown (3 of 3)]
[im 1/19]
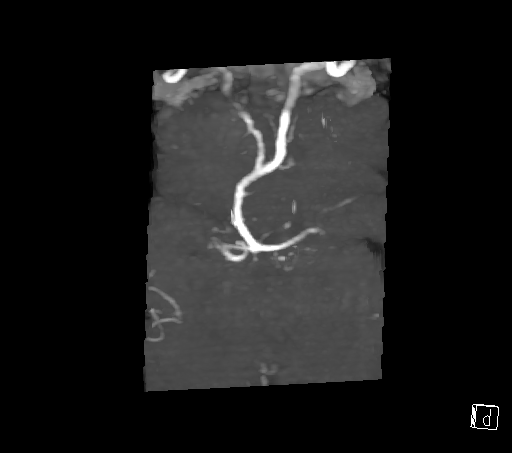

[Series 1044: rotate · 0.34mm/px · 1 of 18 slices shown (3 of 4)]
[im 1/18]
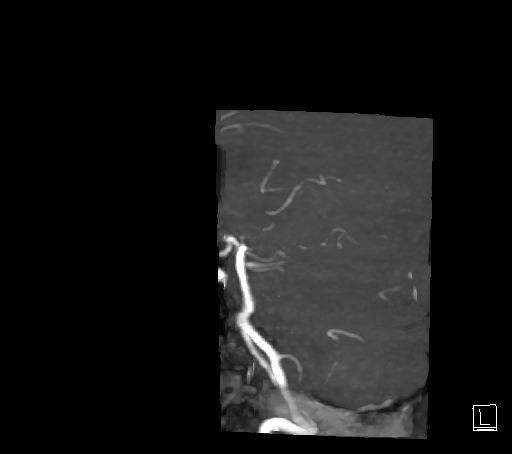

[Series 1048: rotate · 0.34mm/px · 1 of 18 slices shown (4 of 4)]
[im 1/18]
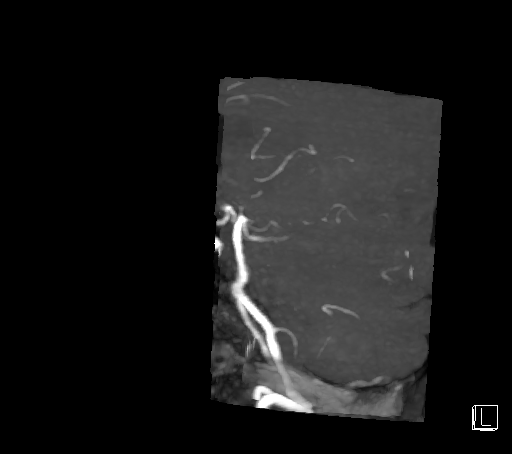

[28 of 48 positions shown; findings below may reference images not displayed]

FINDINGS: MRI HEAD FINDINGS

Brain: Acute infarction affects the inferior cerebellum on the right
consistent with right PICA territory infarction. No evidence of mass
effect or hemorrhage. No other acute infarction. Elsewhere, there
are old small vessel infarctions within the left thalamus and
affecting the cerebral hemispheric deep and subcortical white
matter. No large vessel supratentorial infarction. No mass,
hemorrhage, hydrocephalus or extra-axial collection.

Vascular: Major vessels at the base of the brain show flow. However,
there may be slow or diminished flow in the right vertebral artery.

Skull and upper cervical spine: Negative

Sinuses/Orbits: Clear sinuses. Orbits negative. Previous lens
implants.

Other: None

MRA HEAD FINDINGS

Anterior circulation: Both internal carotid arteries are patent
through the skull base and siphon regions. The anterior and middle
cerebral vessels show flow, but there are considerable stenotic
changes within the MCA branch vessels, right more than left.

Posterior circulation: Both vertebral arteries are patent to the
basilar. However, the right V4 and V3 segments show narrowing and
irregularity with serial stenoses. No basilar stenosis. Flow is seen
within the proximal right PICA but not within more distal branches.
Left PICA shows flow. No basilar stenosis. Superior cerebellar and
posterior cerebral arteries show flow. Right PCA takes fetal origin
from the anterior circulation. Distal PCA branches show narrowing
and irregularity.

Anatomic variants: None other significant.
IMPRESSION: Acute infarction in the right inferior cerebellum consistent with
right PICA territory stroke. No other acute stroke. Chronic
small-vessel ischemic changes elsewhere throughout the brain as
outlined above.

Narrowing and diminished flow within the right vertebral artery V3
and V4 segments. Flow seen in the proximal right PICA but not in
more distal branches.

Intracranial branch vessel atherosclerotic narrowing and
irregularity, most notable in the PCA and MCA branches.

## 2020-11-17 IMAGING — CT CT RENAL STONE PROTOCOL
2 of 4 series · 16 of 46 positions shown, 18 images · non-contrast
Comparison: None.

CLINICAL DATA: Flank pain, hematuria

EXAM:
CT ABDOMEN AND PELVIS WITHOUT CONTRAST
TECHNIQUE: Multidetector CT imaging of the abdomen and pelvis was performed
following the standard protocol without IV contrast.

[Series 2: axial st · axial · 0.90mm/px · z∈[-681,-226]mm · 13 of 107 slices shown, 15 images]
[im 8/107  soft-tissue]
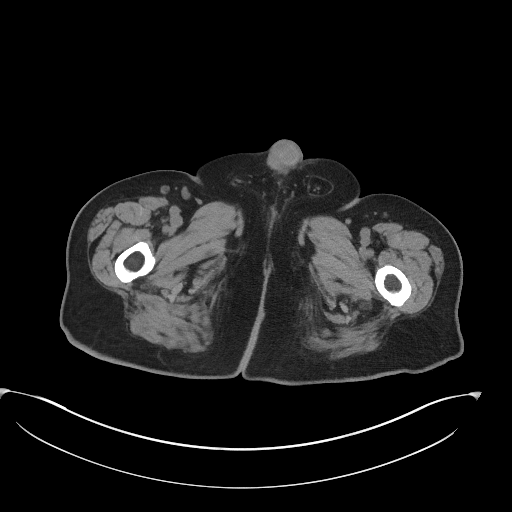
[im 8/107  bone]
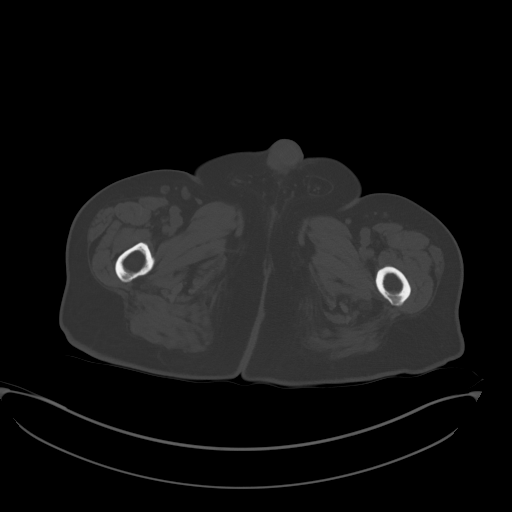
[im 15/107  soft-tissue]
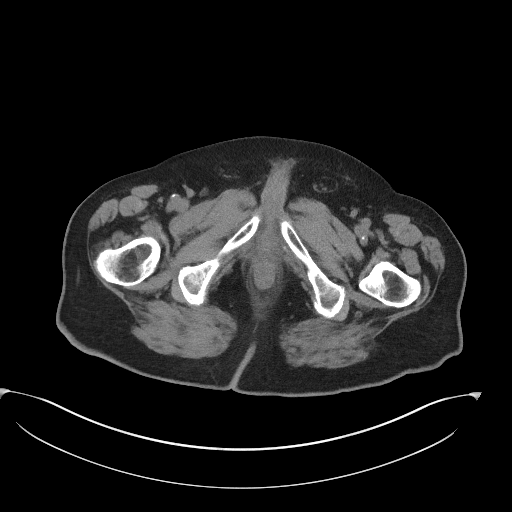
[im 22/107  soft-tissue]
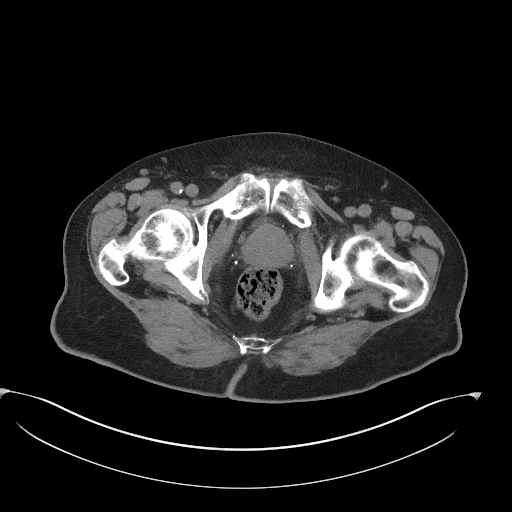
[im 29/107  soft-tissue]
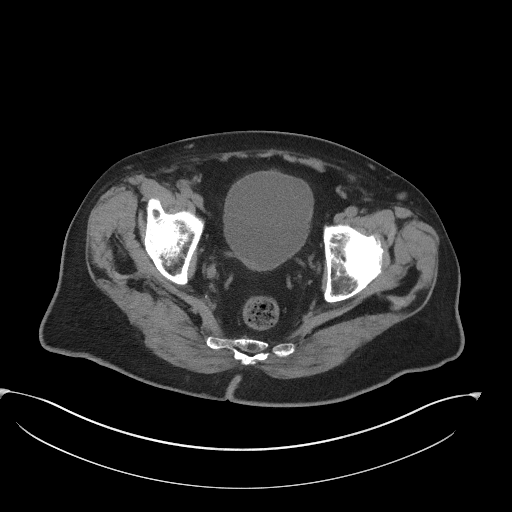
[im 36/107  soft-tissue]
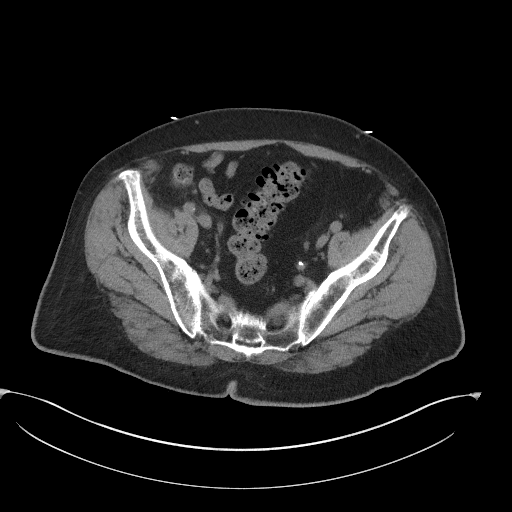
[im 43/107  soft-tissue]
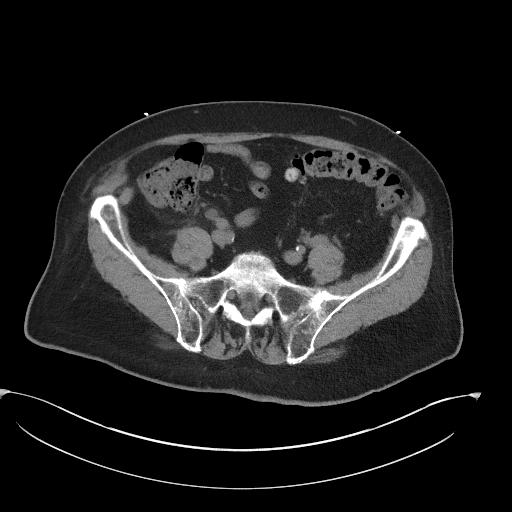
[im 57/107  soft-tissue]
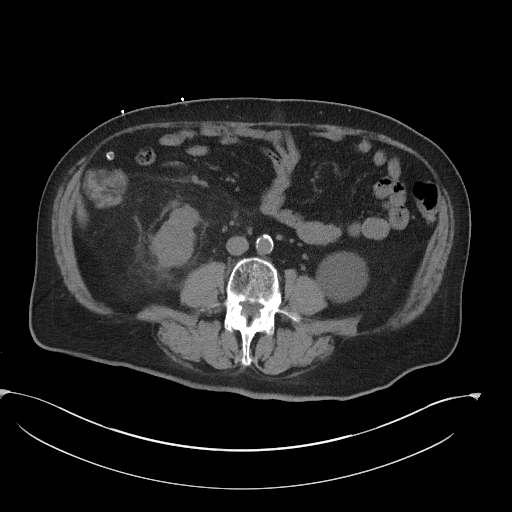
[im 64/107  soft-tissue]
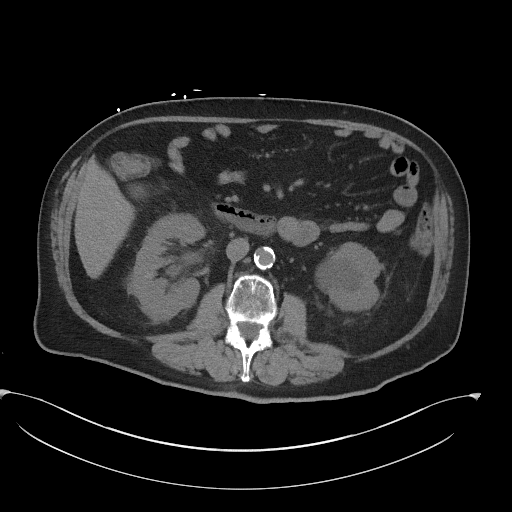
[im 71/107  soft-tissue]
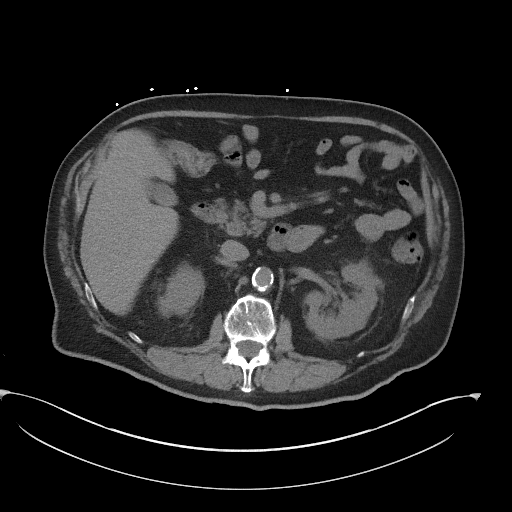
[im 71/107  bone]
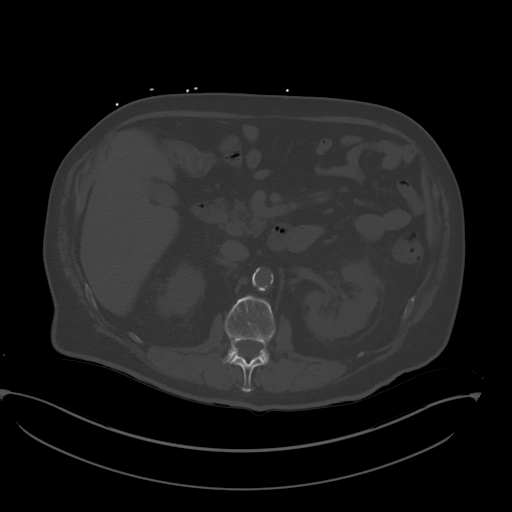
[im 78/107  soft-tissue]
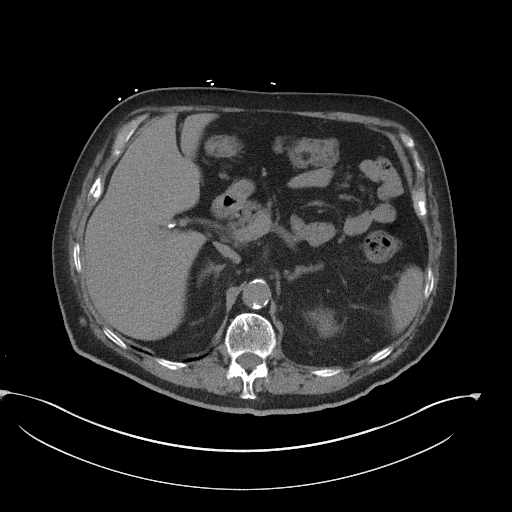
[im 85/107  soft-tissue]
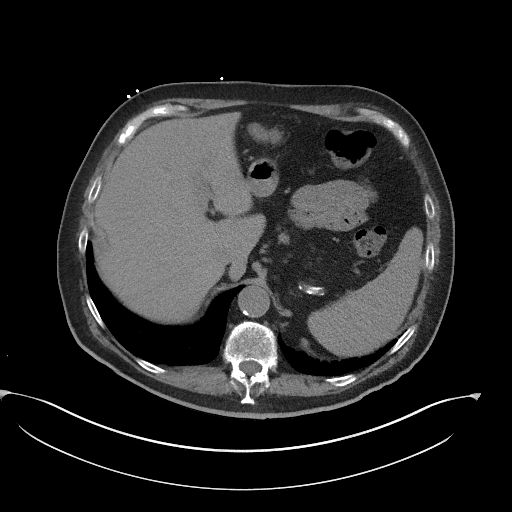
[im 92/107  soft-tissue]
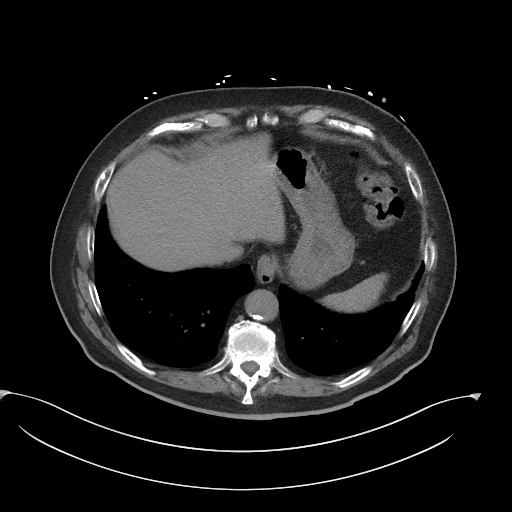
[im 99/107  soft-tissue]
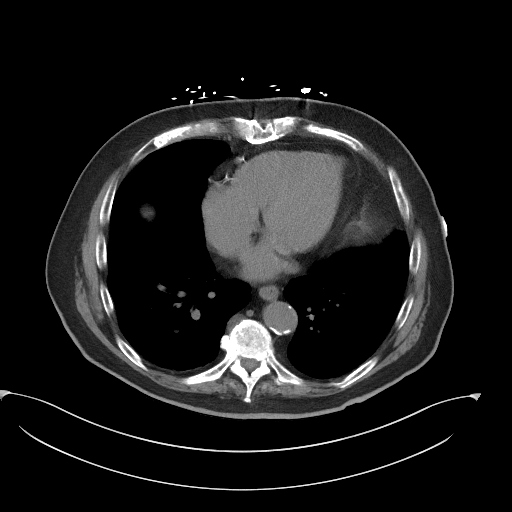

[Series 5: coronal st · coronal · 0.83mm/px · 3 of 109 slices shown]
[im 37/109  soft-tissue]
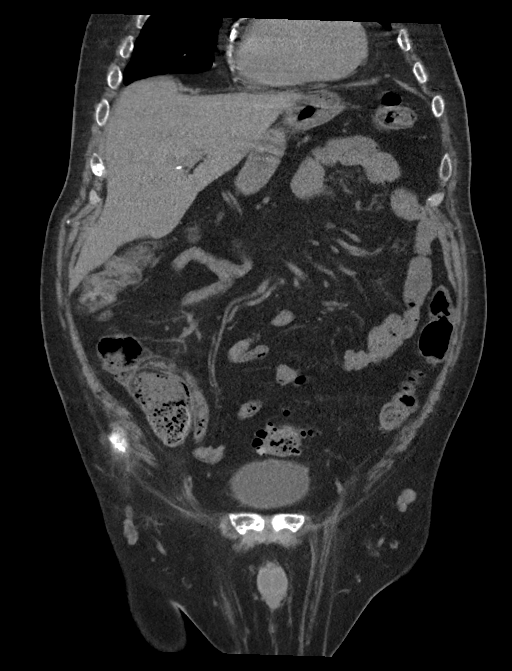
[im 49/109  soft-tissue]
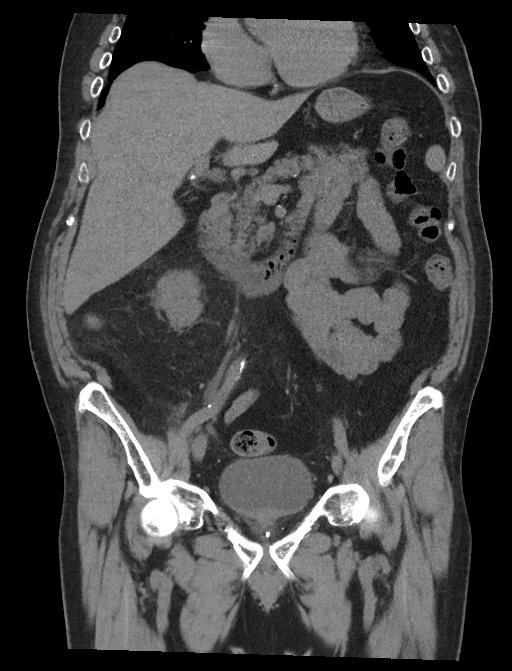
[im 61/109  soft-tissue]
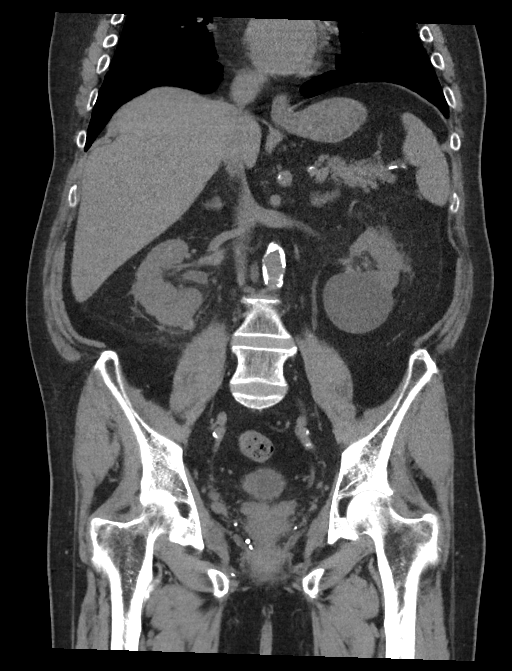

[16 of 46 positions shown; findings below may reference images not displayed]

FINDINGS: Lower chest: No pleural or pericardial effusion. Coronary
calcifications. Sternotomy wires.

Hepatobiliary: No focal liver abnormality is seen. No gallstones,
gallbladder wall thickening, or biliary dilatation.

Pancreas: Unremarkable. No pancreatic ductal dilatation or
surrounding inflammatory changes.

Spleen: Normal in size without focal abnormality.

Adrenals/Urinary Tract: Adrenal glands unremarkable. 6.1 cm probable
cyst from the lower pole left kidney. 1.1 cm hyperdense lesion from
the lower pole right kidney. 1 mm calculus in the upper pole left
renal collecting system. No other urolithiasis. There is mild right
pelvicaliectasis with edematous/inflammatory changes around the
right kidney and proximal ureter. Urinary bladder is physiologically
distended without calculus.

Stomach/Bowel: Stomach and small bowel are nondistended. Appendix
surgically absent. The colon is nondilated with scattered distal
descending and sigmoid diverticula; no adjacent inflammatory change.

Vascular/Lymphatic: Extensive aortoiliac calcified plaque without
aneurysm. No abdominal or pelvic adenopathy.

Reproductive: Mild prostate enlargement.

Other: Bilateral pelvic phleboliths.  No ascites.  No free air.

Musculoskeletal: Sternotomy wires. No fracture or worrisome bone
lesion.
IMPRESSION: 1. Left nephrolithiasis.
2. Streaky inflammatory changes around the right kidney with
pelvicaliectasis suggesting possible recent stone passage, versus
UTI.
3. Descending and sigmoid diverticulosis.
4. Coronary and Aortic Atherosclerosis ([6E]-170.0).
5. Nonspecific 1.1 cm hyperdense right lower pole renal lesion. In
the absence of prior studies demonstrating stability, consider 1
year follow-up with renal protocol MR or CT with contrast.

## 2020-11-17 IMAGING — CT CT HEAD W/O CM
4 series · 16 of 47 positions shown, 18 images · non-contrast
Comparison: None.

CLINICAL DATA: Dizziness

EXAM:
CT HEAD WITHOUT CONTRAST
TECHNIQUE: Contiguous axial images were obtained from the base of the skull
through the vertex without intravenous contrast.

[Series 2: head w o · axial · 0.46mm/px · z∈[-4,+111]mm · 7 of 31 slices shown, 9 images]
[im 4/31  brain]
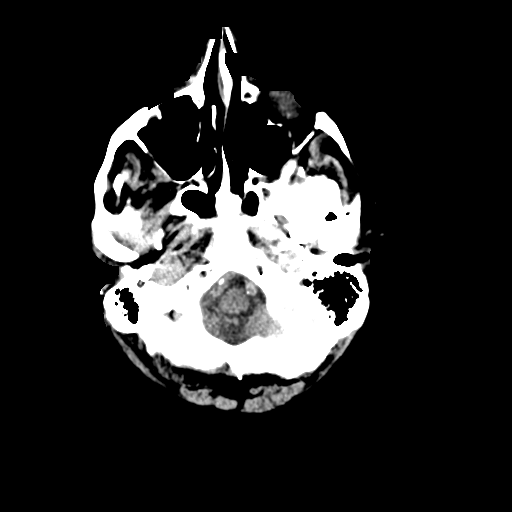
[im 4/31  bone]
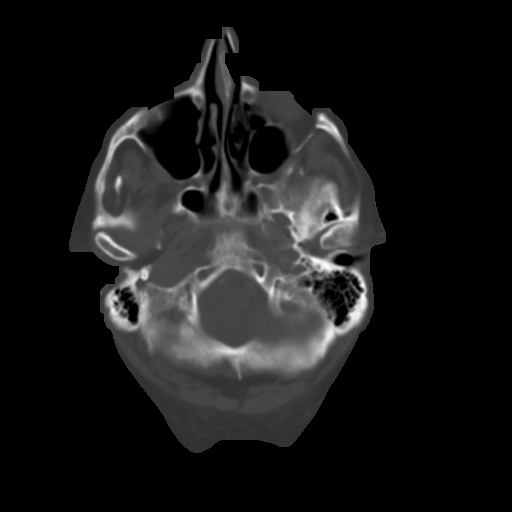
[im 8/31  brain]
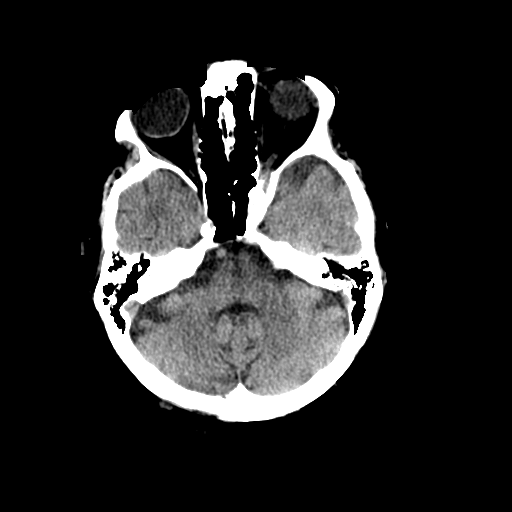
[im 12/31  brain]
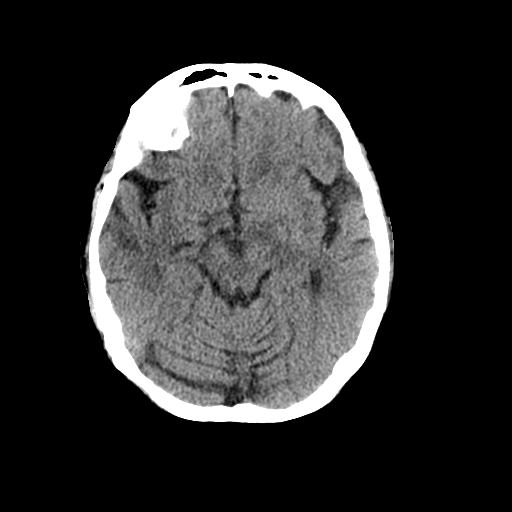
[im 16/31  brain]
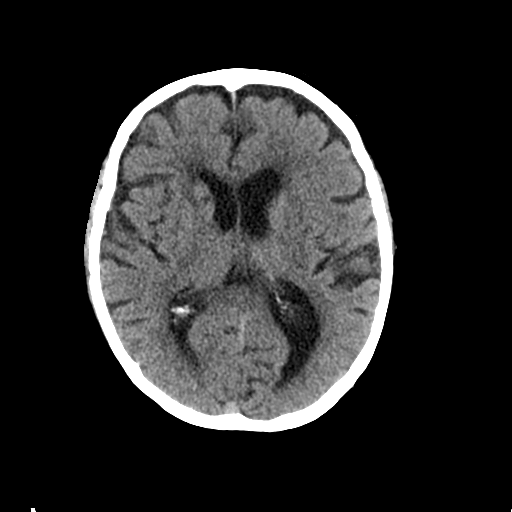
[im 19/31  brain]
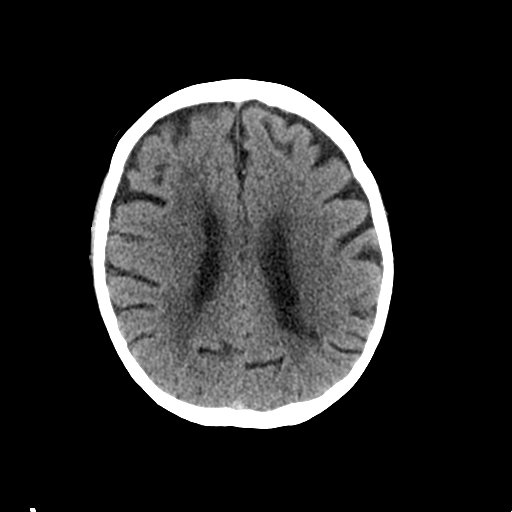
[im 19/31  bone]
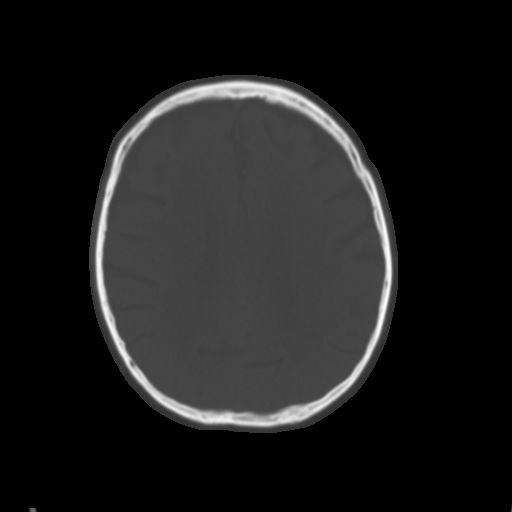
[im 23/31  brain]
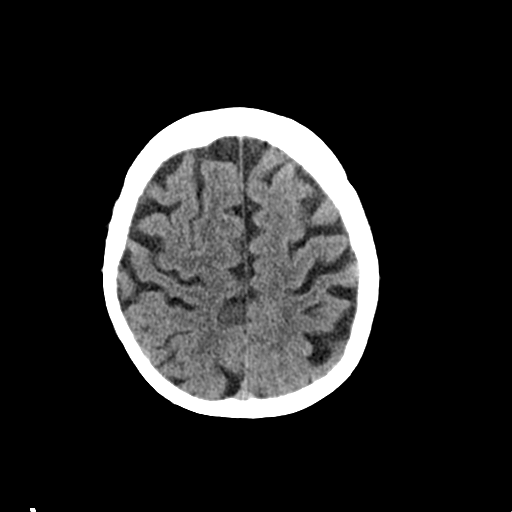
[im 27/31  brain]
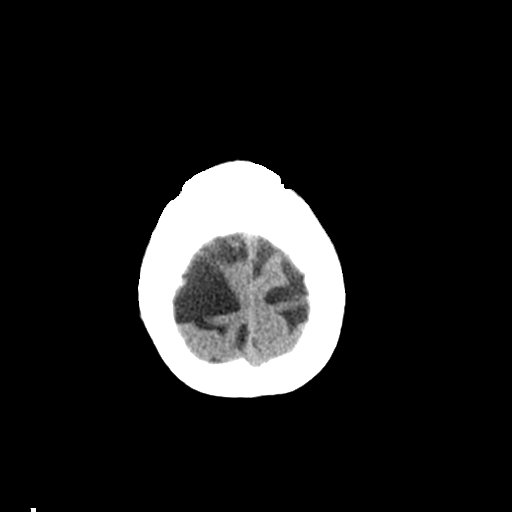

[Series 3: head bone · axial · 0.46mm/px · z∈[-5,+27]mm · 3 of 78 slices shown]
[im 8/78  bone]
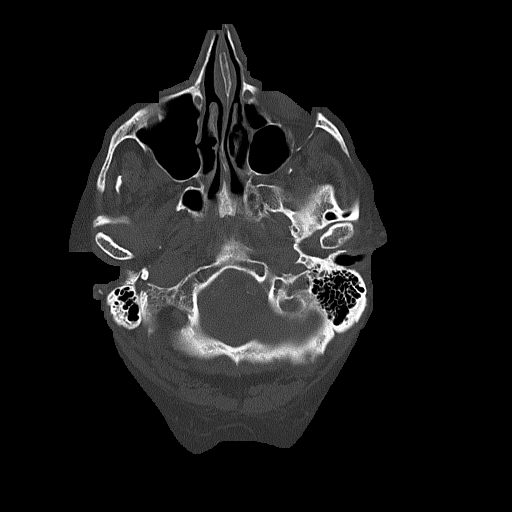
[im 16/78  bone]
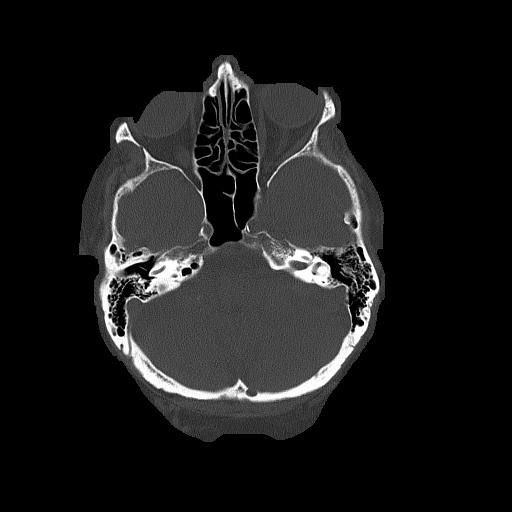
[im 24/78  bone]
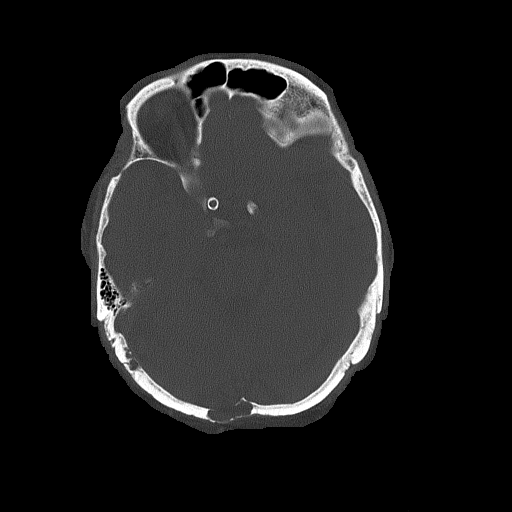

[Series 4: coronal soft · coronal · 0.35mm/px · 3 of 71 slices shown]
[im 24/71  brain]
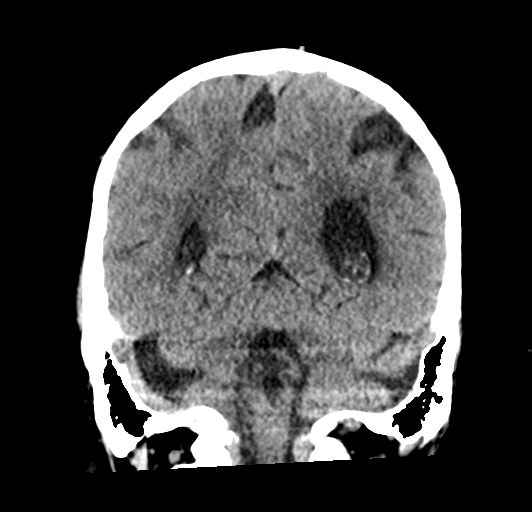
[im 32/71  brain]
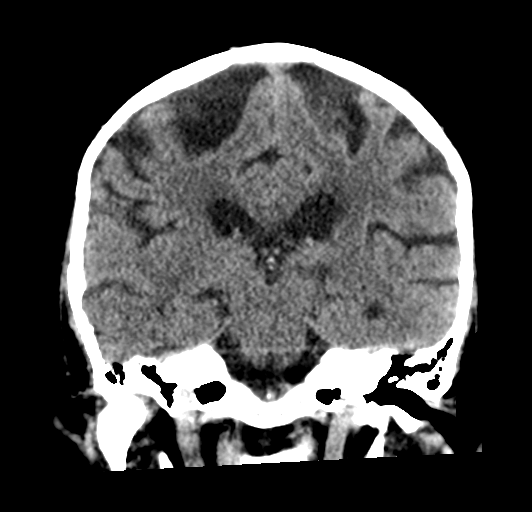
[im 39/71  brain]
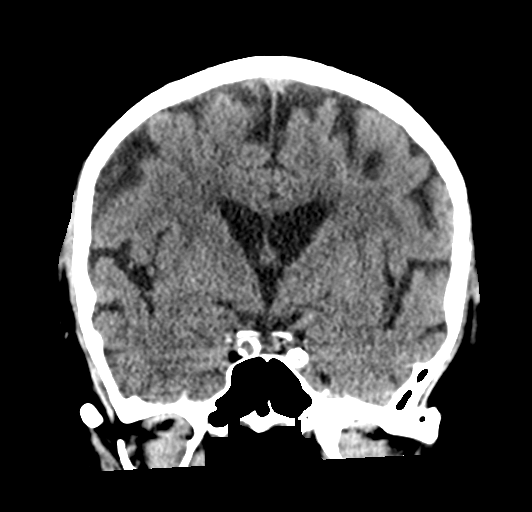

[Series 5: sagittal soft · sagittal · 0.33mm/px · 3 of 60 slices shown]
[im 20/60  brain]
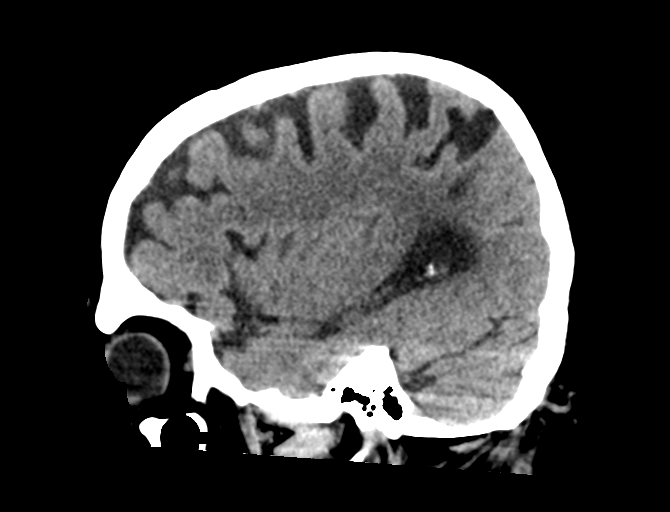
[im 30/60  brain]
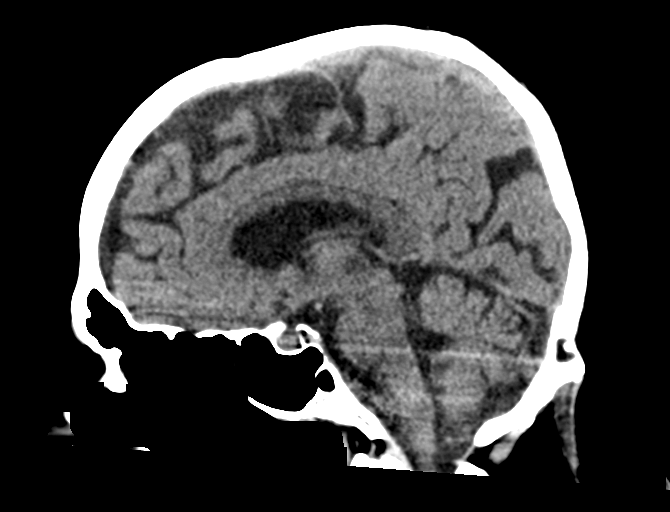
[im 40/60  brain]
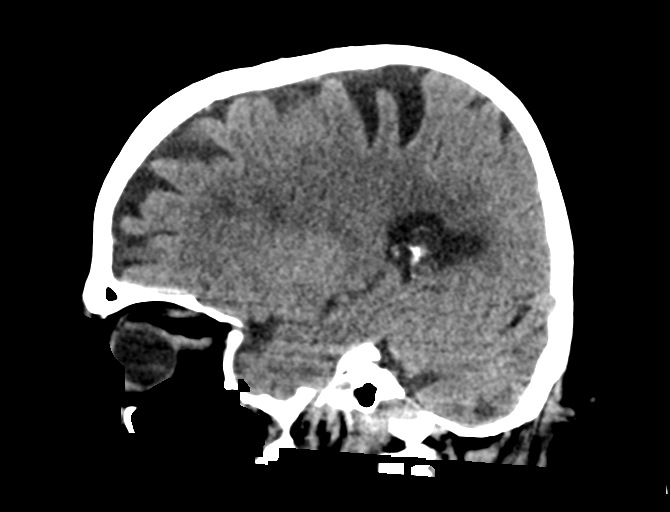

[16 of 47 positions shown; findings below may reference images not displayed]

FINDINGS: Brain: No evidence of acute infarction, hemorrhage, hydrocephalus,
extra-axial collection or mass lesion/mass effect. Small lacunar
infarct within the right caudate head. Suspect prior infarct in the
inferior aspect of the right cerebellar hemisphere. Moderate
low-density changes within the periventricular and subcortical white
matter compatible with chronic microvascular ischemic change. Mild
diffuse cerebral volume loss.

Vascular: Atherosclerotic calcifications involving the large vessels
of the skull base. No unexpected hyperdense vessel.

Skull: Arachnoid granulations noted adjacent to the dural venous
sinuses. Negative for calvarial fracture. No suspicious calvarial
lesion.

Sinuses/Orbits: No acute finding.

Other: None.
IMPRESSION: 1. No evidence of an acute intracranial process by CT.
2. Chronic microvascular ischemic change and cerebral volume loss.

## 2020-11-17 IMAGING — DX DG CHEST 1V PORT
1 series · 1 of 1 positions shown · non-contrast
Comparison: [DATE]

CLINICAL DATA: Dizziness

EXAM:
PORTABLE CHEST 1 VIEW

[chest ap]
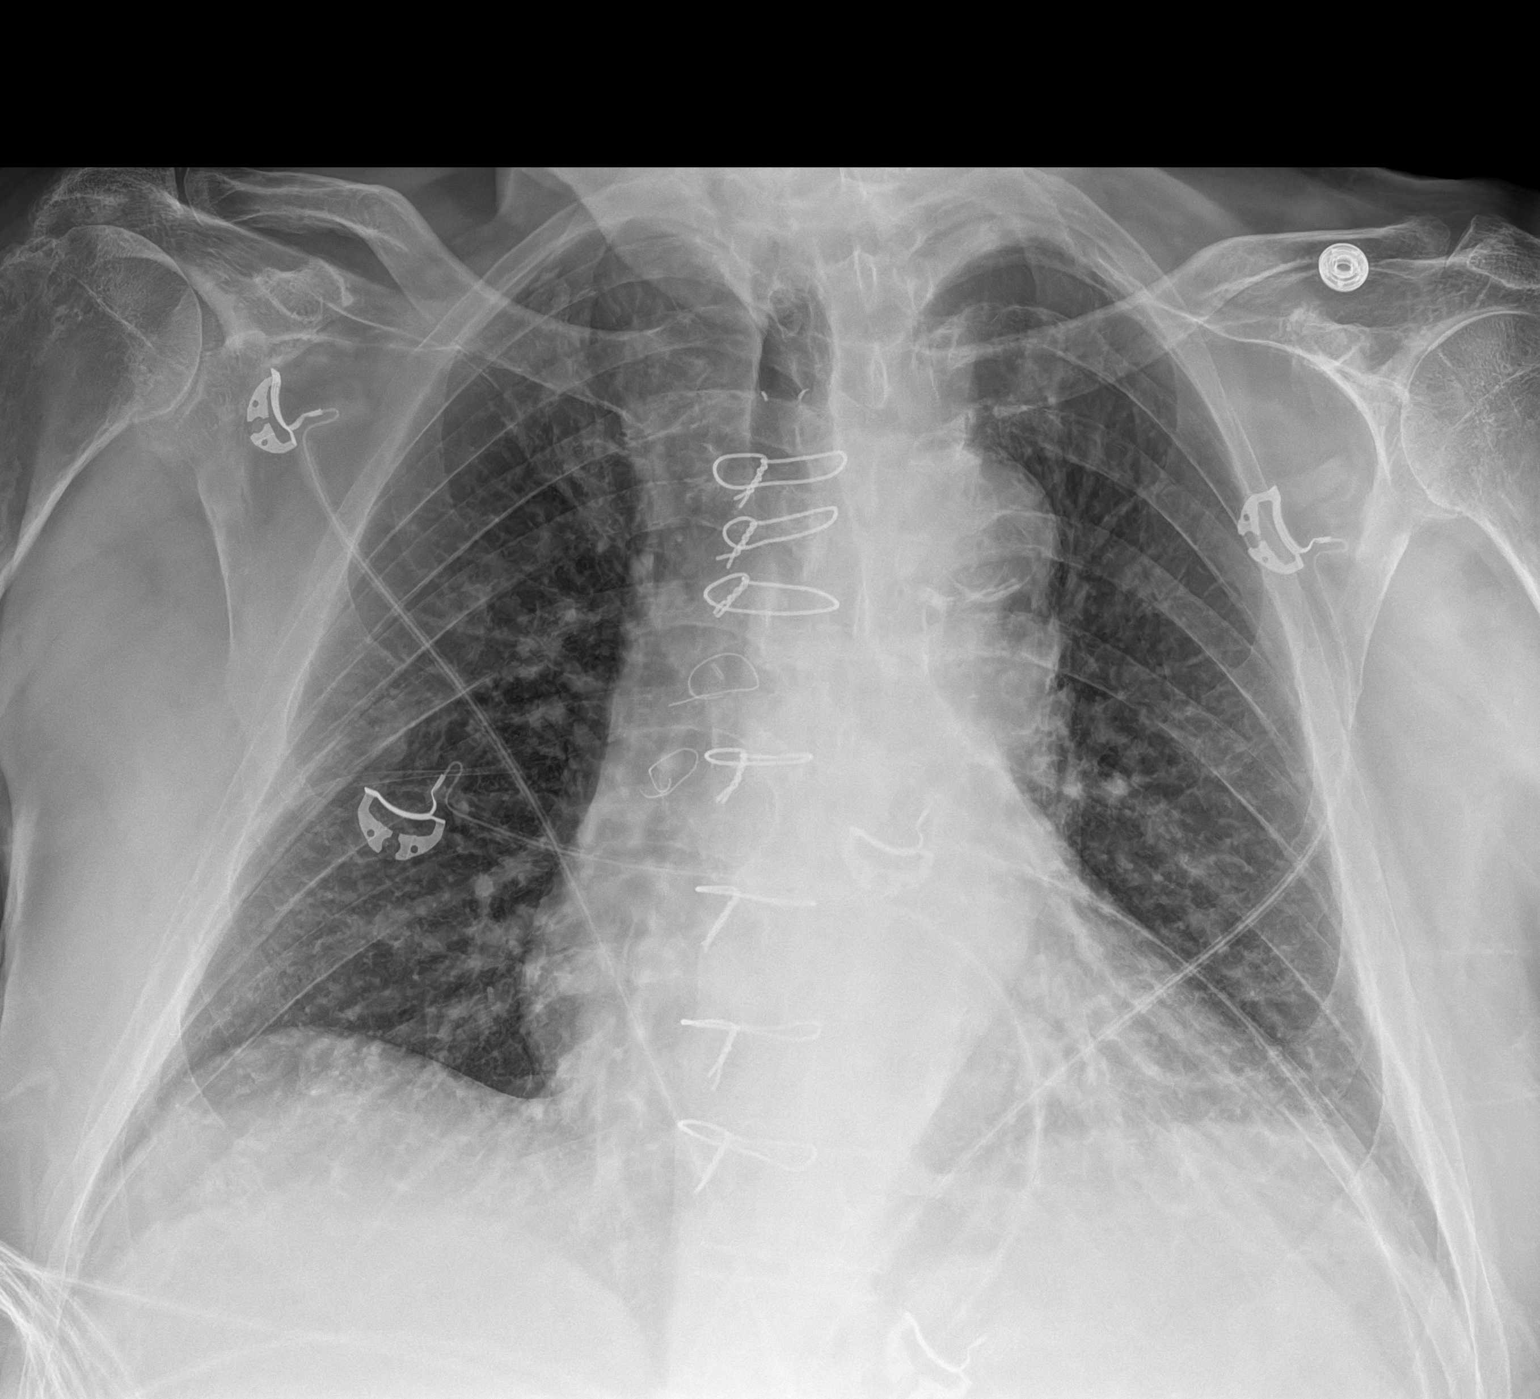

[1 of 1 positions shown; findings below may reference images not displayed]

FINDINGS: Cardiac shadow is stable. Aortic calcifications are again seen.
Postsurgical changes are noted. The lungs are well aerated
bilaterally. Mild interstitial changes are noted which may be
related to mild bronchitis. No focal confluent infiltrate is seen.
No sizable effusion is noted.
IMPRESSION: Mild increased bronchitic markings.  No focal infiltrate is seen.

## 2020-11-17 MED ORDER — ENSURE ENLIVE PO LIQD
237.0000 mL | Freq: Two times a day (BID) | ORAL | Status: DC
  Administered 2020-11-18 (×2): 237 mL via ORAL

## 2020-11-17 MED ORDER — SODIUM CHLORIDE 0.9 % IV BOLUS
500.0000 mL | Freq: Once | INTRAVENOUS | Status: AC
  Administered 2020-11-17: 500 mL via INTRAVENOUS

## 2020-11-17 MED ORDER — DILTIAZEM HCL ER COATED BEADS 120 MG PO CP24
120.0000 mg | ORAL_CAPSULE | Freq: Every day | ORAL | Status: DC
  Administered 2020-11-17 – 2020-11-18 (×2): 120 mg via ORAL
  Filled 2020-11-17 (×2): qty 1

## 2020-11-17 MED ORDER — STROKE: EARLY STAGES OF RECOVERY BOOK: Freq: Once | Status: DC

## 2020-11-17 MED ORDER — INSULIN ASPART 100 UNIT/ML IJ SOLN
0.0000 [IU] | Freq: Three times a day (TID) | INTRAMUSCULAR | Status: DC
  Administered 2020-11-18 (×2): 5 [IU] via SUBCUTANEOUS

## 2020-11-17 MED ORDER — ACETAMINOPHEN 160 MG/5ML PO SOLN: 650.0000 mg | ORAL | Status: DC | PRN

## 2020-11-17 MED ORDER — SODIUM CHLORIDE 0.9 % IV SOLN
INTRAVENOUS | Status: DC
Start: 2020-11-17 — End: 2020-11-18

## 2020-11-17 MED ORDER — ACETAMINOPHEN 650 MG RE SUPP: 650.0000 mg | RECTAL | Status: DC | PRN

## 2020-11-17 MED ORDER — HEPARIN SODIUM (PORCINE) 5000 UNIT/ML IJ SOLN
5000.0000 [IU] | Freq: Three times a day (TID) | INTRAMUSCULAR | Status: DC
  Administered 2020-11-17 – 2020-11-18 (×3): 5000 [IU] via SUBCUTANEOUS
  Filled 2020-11-17 (×3): qty 1

## 2020-11-17 MED ORDER — CEPHALEXIN 500 MG PO CAPS: 500.0000 mg | ORAL_CAPSULE | Freq: Four times a day (QID) | ORAL | 0 refills | Status: AC

## 2020-11-17 MED ORDER — SODIUM CHLORIDE 0.9 % IV SOLN
1.0000 g | Freq: Once | INTRAVENOUS | Status: AC
  Administered 2020-11-17: 1 g via INTRAVENOUS
  Filled 2020-11-17: qty 10

## 2020-11-17 MED ORDER — SENNOSIDES-DOCUSATE SODIUM 8.6-50 MG PO TABS: 1.0000 | ORAL_TABLET | Freq: Every evening | ORAL | Status: DC | PRN

## 2020-11-17 MED ORDER — AMLODIPINE BESYLATE 5 MG PO TABS
10.0000 mg | ORAL_TABLET | Freq: Every day | ORAL | Status: DC
  Administered 2020-11-17 – 2020-11-18 (×2): 10 mg via ORAL
  Filled 2020-11-17 (×2): qty 2

## 2020-11-17 MED ORDER — LOSARTAN POTASSIUM 50 MG PO TABS
100.0000 mg | ORAL_TABLET | Freq: Every day | ORAL | Status: DC
  Administered 2020-11-17 – 2020-11-18 (×2): 100 mg via ORAL
  Filled 2020-11-17 (×2): qty 2

## 2020-11-17 MED ORDER — SODIUM CHLORIDE 0.9 % IV SOLN
1.0000 g | INTRAVENOUS | Status: DC
  Administered 2020-11-18: 1 g via INTRAVENOUS
  Filled 2020-11-17: qty 10

## 2020-11-17 MED ORDER — ASPIRIN 81 MG PO CHEW
324.0000 mg | CHEWABLE_TABLET | Freq: Once | ORAL | Status: AC
  Administered 2020-11-17: 324 mg via ORAL
  Filled 2020-11-17: qty 4

## 2020-11-17 MED ORDER — METOPROLOL SUCCINATE ER 50 MG PO TB24
50.0000 mg | ORAL_TABLET | Freq: Every day | ORAL | Status: DC
  Administered 2020-11-17 – 2020-11-18 (×2): 50 mg via ORAL
  Filled 2020-11-17 (×2): qty 1

## 2020-11-17 MED ORDER — ACETAMINOPHEN 325 MG PO TABS
650.0000 mg | ORAL_TABLET | ORAL | Status: DC | PRN
  Administered 2020-11-17: 650 mg via ORAL
  Filled 2020-11-17: qty 2

## 2020-11-17 MED ORDER — INSULIN ASPART 100 UNIT/ML IJ SOLN
0.0000 [IU] | Freq: Every day | INTRAMUSCULAR | Status: DC
  Administered 2020-11-17: 3 [IU] via SUBCUTANEOUS

## 2020-11-17 MED ORDER — LEVOTHYROXINE SODIUM 75 MCG PO TABS
75.0000 ug | ORAL_TABLET | Freq: Every day | ORAL | Status: DC
  Administered 2020-11-18: 75 ug via ORAL
  Filled 2020-11-17: qty 1

## 2020-11-17 NOTE — ED Provider Notes (Signed)
Patient does have an a positive MRI for acute stroke, admitted to the hospitalist at 4:15 PM   Eber Hong, MD 11/17/20 [phone]

## 2020-11-17 NOTE — ED Triage Notes (Signed)
Pt arrives via [facility] with c/o dizziness that started last Thursday. Dizziness comes and goes per pt.

## 2020-11-17 NOTE — ED Provider Notes (Signed)
[facility] EMERGENCY DEPARTMENT Provider Note   CSN: [id number] Arrival date & time: 11/17/20  5176     History Chief Complaint  Patient presents with  . Dizziness    Jason Cox is a 75 y.o. male.  Patient brought in by EMS with a complaint of dizziness not true room spinning that started last Thursday.  Dizziness comes and goes per the patient.  He kind of was stumbling around in the kitchen like pinball.  No falls.  Patient also with burning with urination passed some blood a few days ago.  He thinks he is got a urinary tract infection.  Temp on arrival 97.9 heart rate 103 respirations 26 blood pressure 160/87 oxygen saturation is 94% on room air.  Patient without any of the dizziness or lightheadedness and now.  He thinks he could walk normally.  But was not able to do that earlier.  And as mentioned this is been ongoing for almost a week now.  Past medical history sniffing for hypothyroidism hypertension peripheral vascular disease and type 2 diabetes.        Past Medical History:  Diagnosis Date  . Hypertension    Benign Essential  . Hypothyroidism   . Obesity   . Osteoarthritis   . PVD (peripheral vascular disease) (HCC)   . Type 2 diabetes mellitus (HCC)     There are no problems to display for this patient.   Past Surgical History:  Procedure Laterality Date  . APPENDECTOMY    . TONSILLECTOMY         Family History  Problem Relation Age of Onset  . Osteoporosis Mother   . Hypertension Mother   . Cancer Mother   . Heart attack Father   . Heart attack Maternal Grandmother   . Pneumonia Maternal Grandfather   . Heart disease Paternal Grandmother   . Heart disease Paternal Grandfather     Social History   Tobacco Use  . Smoking status: Former Games developer  . Smokeless tobacco: Never Used  Vaping Use  . Vaping Use: Never used  Substance Use Topics  . Alcohol use: Not Currently  . Drug use: Never    Home Medications Prior to Admission  medications   Medication Sig Start Date End Date Taking? Authorizing Provider  cephALEXin (KEFLEX) 500 MG capsule Take 1 capsule (500 mg total) by mouth 4 (four) times daily for 10 days. 11/17/20 11/27/20 Yes Vanetta Mulders, MD  amLODipine (NORVASC) 10 MG tablet  12/10/19   [provider]  diltiazem (CARDIZEM CD) 120 MG 24 hr capsule  11/19/19   [provider]  diltiazem (CARDIZEM CD) 240 MG 24 hr capsule Take 240 mg by mouth daily. 03/24/20   [provider]  indomethacin (INDOCIN) 25 MG capsule Take 25 mg by mouth 4 (four) times daily as needed. 11/19/19   [provider]  levothyroxine (SYNTHROID) 75 MCG tablet  11/22/19   [provider]  losartan (COZAAR) 100 MG tablet  10/19/19   [provider]  metFORMIN (GLUCOPHAGE-XR) 500 MG 24 hr tablet  11/01/19   [provider]  metoprolol succinate (TOPROL-XL) 50 MG 24 hr tablet  12/10/19   [provider]    Allergies    Patient has no allergy information on record.  Review of Systems   Review of Systems  Constitutional: Negative for chills and fever.  HENT: Negative for congestion, rhinorrhea and sore throat.   Eyes: Negative for visual disturbance.  Respiratory: Negative for cough and shortness  of breath.   Cardiovascular: Negative for chest pain and leg swelling.  Gastrointestinal: Negative for abdominal pain, diarrhea, nausea and vomiting.  Genitourinary: Positive for dysuria and hematuria.  Musculoskeletal: Negative for back pain and neck pain.  Skin: Negative for rash.  Neurological: Positive for dizziness and light-headedness. Negative for headaches.  Hematological: Does not bruise/bleed easily.  Psychiatric/Behavioral: Negative for confusion.    Physical Exam Updated Vital Signs BP (!) 151/84   Pulse (!) 102   Temp 97.9 F (36.6 C) (Oral)   Resp (!) 28   Ht 1.778 m ( )   Wt 90.7 kg   SpO2 91%   BMI 28.70 kg/m   Physical Exam Vitals and nursing  note reviewed.  Constitutional:      General: He is not in acute distress.    Appearance: Normal appearance. He is well-developed.  HENT:     Head: Normocephalic and atraumatic.  Eyes:     Extraocular Movements: Extraocular movements intact.     Conjunctiva/sclera: Conjunctivae normal.     Pupils: Pupils are equal, round, and reactive to light.  Cardiovascular:     Rate and Rhythm: Normal rate and regular rhythm.     Heart sounds: No murmur heard.   Pulmonary:     Effort: Pulmonary effort is normal. No respiratory distress.     Breath sounds: Normal breath sounds.  Abdominal:     General: There is no distension.     Palpations: Abdomen is soft.     Tenderness: There is no abdominal tenderness.  Genitourinary:    Penis: Normal.      Comments: No discharge Musculoskeletal:     Cervical back: Normal range of motion and neck supple. No rigidity.  Skin:    General: Skin is warm and dry.     Capillary Refill: Capillary refill takes less than 2 seconds.  Neurological:     General: No focal deficit present.     Mental Status: He is alert and oriented to person, place, and time.     Cranial Nerves: No cranial nerve deficit.     Sensory: No sensory deficit.     Motor: No weakness.     Coordination: Coordination normal.     Gait: Gait normal.     ED Results / Procedures / Treatments   Labs (all labs ordered are listed, but only abnormal results are displayed) Labs Reviewed  CBC WITH DIFFERENTIAL/PLATELET - Abnormal; Notable for the following components:      Result Value   WBC 18.1 (*)    RBC 4.15 (*)    Neutro Abs 15.8 (*)    Monocytes Absolute 1.3 (*)    Abs Immature Granulocytes 0.12 (*)    All other components within normal limits  COMPREHENSIVE METABOLIC PANEL - Abnormal; Notable for the following components:   Sodium 133 (*)    Glucose, Bld 346 (*)    Albumin 3.1 (*)    AST 14 (*)    All other components within normal limits  URINALYSIS, ROUTINE W REFLEX  MICROSCOPIC - Abnormal; Notable for the following components:   APPearance HAZY (*)    Glucose, UA >=500 (*)    Hgb urine dipstick SMALL (*)    Ketones, ur 20 (*)    Protein, ur 100 (*)    Leukocytes,Ua MODERATE (*)    WBC, UA >50 (*)    Bacteria, UA RARE (*)    All other components within normal limits  URINE CULTURE    EKG EKG  Interpretation  Date/Time:  Tuesday Nov 17 2020 09:34:46 EDT Ventricular Rate:  119 PR Interval:  213 QRS Duration: 129 QT Interval:  354 QTC Calculation: 455 R Axis:   -11 Text Interpretation: Sinus tachycardia Borderline prolonged PR interval Right bundle branch block Minimal ST elevation, inferior leads New since previous tracing Confirmed by Vanetta Mulders [phone]) on 11/17/2020 9:52:11 AM   Radiology CT Head Wo Contrast  Result Date: 11/17/2020 CLINICAL DATA:  Dizziness EXAM: CT HEAD WITHOUT CONTRAST TECHNIQUE: Contiguous axial images were obtained from the base of the skull through the vertex without intravenous contrast. COMPARISON:  None. FINDINGS: Brain: No evidence of acute infarction, hemorrhage, hydrocephalus, extra-axial collection or mass lesion/mass effect. Small lacunar infarct within the right caudate head. Suspect prior infarct in the inferior aspect of the right cerebellar hemisphere. Moderate low-density changes within the periventricular and subcortical white matter compatible with chronic microvascular ischemic change. Mild diffuse cerebral volume loss. Vascular: Atherosclerotic calcifications involving the large vessels of the skull base. No unexpected hyperdense vessel. Skull: Arachnoid granulations noted adjacent to the dural venous sinuses. Negative for calvarial fracture. No suspicious calvarial lesion. Sinuses/Orbits: No acute finding. Other: None. IMPRESSION: 1. No evidence of an acute intracranial process by CT. 2. Chronic microvascular ischemic change and cerebral volume loss. Electronically Signed   By: Duanne Guess D.O.   On:  11/17/2020 11:57   DG Chest Port 1 View  Result Date: 11/17/2020 CLINICAL DATA:  Dizziness EXAM: PORTABLE CHEST 1 VIEW COMPARISON:  07/03/2007 FINDINGS: Cardiac shadow is stable. Aortic calcifications are again seen. Postsurgical changes are noted. The lungs are well aerated bilaterally. Mild interstitial changes are noted which may be related to mild bronchitis. No focal confluent infiltrate is seen. No sizable effusion is noted. IMPRESSION: Mild increased bronchitic markings.  No focal infiltrate is seen. Electronically Signed   By: Alcide Clever M.D.   On: 11/17/2020 10:57   CT Renal Stone Study  Result Date: 11/17/2020 CLINICAL DATA:  Flank pain, hematuria EXAM: CT ABDOMEN AND PELVIS WITHOUT CONTRAST TECHNIQUE: Multidetector CT imaging of the abdomen and pelvis was performed following the standard protocol without IV contrast. COMPARISON:  None. FINDINGS: Lower chest: No pleural or pericardial effusion. Coronary calcifications. Sternotomy wires. Hepatobiliary: No focal liver abnormality is seen. No gallstones, gallbladder wall thickening, or biliary dilatation. Pancreas: Unremarkable. No pancreatic ductal dilatation or surrounding inflammatory changes. Spleen: Normal in size without focal abnormality. Adrenals/Urinary Tract: Adrenal glands unremarkable. 6.1 cm probable cyst from the lower pole left kidney. 1.1 cm hyperdense lesion from the lower pole right kidney. 1 mm calculus in the upper pole left renal collecting system. No other urolithiasis. There is mild right pelvicaliectasis with edematous/inflammatory changes around the right kidney and proximal ureter. Urinary bladder is physiologically distended without calculus. Stomach/Bowel: Stomach and small bowel are nondistended. Appendix surgically absent. The colon is nondilated with scattered distal descending and sigmoid diverticula; no adjacent inflammatory change. Vascular/Lymphatic: Extensive aortoiliac calcified plaque without aneurysm. No  abdominal or pelvic adenopathy. Reproductive: Mild prostate enlargement. Other: Bilateral pelvic phleboliths.  No ascites.  No free air. Musculoskeletal: Sternotomy wires. No fracture or worrisome bone lesion. IMPRESSION: 1. Left nephrolithiasis. 2. Streaky inflammatory changes around the right kidney with pelvicaliectasis suggesting possible recent stone passage, versus UTI. 3. Descending and sigmoid diverticulosis. 4. Coronary and Aortic Atherosclerosis (ICD10-170.0). 5. Nonspecific 1.1 cm hyperdense right lower pole renal lesion. In the absence of prior studies demonstrating stability, consider 1 year follow-up with renal protocol MR or CT with contrast. Electronically Signed  By: Corlis Leak M.D.   On: 11/17/2020 14:26    Procedures Procedures   Medications Ordered in ED Medications  0.9 %  sodium chloride infusion ( Intravenous New Bag/Given 11/17/20 1158)  sodium chloride 0.9 % bolus 500 mL (0 mLs Intravenous Stopped 11/17/20 1237)  cefTRIAXone (ROCEPHIN) 1 g in sodium chloride 0.9 % 100 mL IVPB (0 g Intravenous Stopped 11/17/20 1359)    ED Course  I have reviewed the triage vital signs and the nursing notes.  Pertinent labs & imaging results that were available during my care of the patient were reviewed by me and considered in my medical decision making (see chart for details).    MDM Rules/Calculators/A&P                          Patient urinalysis suggestive of urinary tract infection.  Certainly there is a leukorrhea.  But no significant bacteria.  Patient's urine sent for culture.  Given a gram of Rocephin here.  Decided to order CT renal study.  Which raises concern for possible passage of kidney stone.  Or evidence of early pyelonephritis or perhaps urinary tract infection.  Head CT without any acute findings.  Was planning on MRI brain since patient wanted to understand why he was having the abnormal gait.  MRI was ordered but then it was going down for routine maintenance.  So  that was delayed.  Did ambulate the patient in the ambulated fine.  MRI now available again.  At the time I was discharging patient but since we have it available patient wants to get it done so he will get his MRI.  Final disposition will be based on the MRI if there is no evidence of any acute infarct and patient could be discharged with follow-up with urology and his primary care doctor and continuing the antibiotic Keflex.  For the infection.  We will make a 10-day treatment just in case its prostate type infection.   Final Clinical Impression(s) / ED Diagnoses Final diagnoses:  Acute cystitis without hematuria    Rx / DC Orders ED Discharge Orders         Ordered    cephALEXin (KEFLEX) 500 MG capsule  4 times daily        11/17/20 1503           Vanetta Mulders, MD 11/17/20 [phone]

## 2020-11-17 NOTE — H&P (Signed)
TRH H&P    Patient Demographics:    Jason Cox, is a 75 y.o. male  MRN: [id number]  DOB - May 02, 1946  Admit Date - 11/17/2020  Referring MD/NP/PA: Hyacinth Meeker  Outpatient Primary MD for the patient is Elfredia Nevins, MD  Patient coming from: Home  Chief complaint- dizziness   HPI:    Jason Cox  is a 75 y.o. male, with history of hypertension, coronary artery disease, diabetes mellitus type 2, thyroid disease, presents the ED with a chief complaint of dizziness.  Patient reports that he has had intermittent dizziness for the past couple of weeks.  He reports that there are no provoking or relieving factors, and it is entirely unpredictable.  Patient reports that at first he did not think anything of it because he has a history of vertigo.  For the past couple of weeks this dizziness does not seem to different than his vertigo.  Today it was much worse.  He reports it as feeling off balance, neither the room nor himself feel like they are spinning.  He was in the kitchen when he had multiple falls today, 1 against the sink, 1 against the stove, and more that he cannot remember to describe.  He reports that he did not hit his head and did not lose consciousness.  He reports that he was afraid to walk in the middle of the room because he knew he would need something to grab onto if he started to fall.  He is not sure if he was falling to one side more than the other or not.  He denies any blurry vision, change in hearing, chest pain, palpitations, weakness or numbness in any extremity, trouble speaking, trouble swallowing, drooling, facial droop.  He does admit to numbness around his mouth that was present today and he is not sure when it went away.  Patient ambulates with a cane, but did not feel safe getting around the home with these falls even with a cane.  This is the reason that patient called EMS to be transported  to the ER today.  He also complains of dysuria.  Dysuria started about a week ago, and then resolved a few days later when he started drinking V8.  He has continued to have frequency, but the dysuria is gone.  He has not noticed hematuria.  He reports he has had UTIs in the past, and that this felt like a UTI which is why he started drinking the V8.  He was not prescribed an antibiotic in the outpatient setting for the symptoms.  On review of systems patient also complains of decreased appetite over the last week.  He reports that he has not been eating, and he knows that it is wrong in the setting of diabetes mellitus type 2.  He reports that he feels dehydrated secondary to poor p.o. intake.  Patient is a former smoker, does not drink alcohol, does not use illicit drugs.  Patient has had 1 dose of COVID-vaccine.  Patient prefers to be DNR.  In the ED  Temp 97.9, heart rate 70-111, respiratory rate 78, blood pressure 154/84, satting at 91% White blood cell count 18,000, hemoglobin 13.4 Chemistry panel shows a pseudohyponatremia 133 that corrects for the sugar of 346 Anion gap is 12 Albumin 3.1 CT head shows no evidence of an acute intracranial process by CT CT renal study shows left nephrolithiasis with streaky inflammatory changes around the right kidney suggesting possible recent stone passage MRI/MRA brain shows acute infarction of right anterior cerebellum consistent with right PICA stroke territory Patient was started on aspirin UA was indicative of UTI, urine culture pending, Rocephin started    Review of systems:    In addition to the HPI above,  No Fever-chills, No Headache, No changes with Vision or hearing, No problems swallowing food or Liquids, No Chest pain, Cough or Shortness of Breath, No Abdominal pain, No Nausea or Vomiting, bowel movements are regular, No Blood in stool or Urine, No new skin rashes or bruises, patient does have chronic psoriasis No new joints pains-aches,   No new weakness, tingling, numbness in any extremity, No polyuria, polydypsia or polyphagia, No significant Mental Stressors.  All other systems reviewed and are negative.    Past History of the following :    Past Medical History:  Diagnosis Date  . Hypertension    Benign Essential  . Hypothyroidism   . Obesity   . Osteoarthritis   . PVD (peripheral vascular disease) (HCC)   . Type 2 diabetes mellitus (HCC)       Past Surgical History:  Procedure Laterality Date  . APPENDECTOMY    . TONSILLECTOMY        Social History:      Social History   Tobacco Use  . Smoking status: Former Games developer  . Smokeless tobacco: Never Used  Substance Use Topics  . Alcohol use: Not Currently       Family History :     Family History  Problem Relation Age of Onset  . Osteoporosis Mother   . Hypertension Mother   . Cancer Mother   . Heart attack Father   . Heart attack Maternal Grandmother   . Pneumonia Maternal Grandfather   . Heart disease Paternal Grandmother   . Heart disease Paternal Grandfather       Home Medications:   Prior to Admission medications   Medication Sig Start Date End Date Taking? Authorizing Provider  cephALEXin (KEFLEX) 500 MG capsule Take 1 capsule (500 mg total) by mouth 4 (four) times daily for 10 days. 11/17/20 11/27/20 Yes Vanetta Mulders, MD  amLODipine (NORVASC) 10 MG tablet  12/10/19   [provider]  diltiazem (CARDIZEM CD) 120 MG 24 hr capsule  11/19/19   [provider]  diltiazem (CARDIZEM CD) 240 MG 24 hr capsule Take 240 mg by mouth daily. 03/24/20   [provider]  indomethacin (INDOCIN) 25 MG capsule Take 25 mg by mouth 4 (four) times daily as needed. 11/19/19   [provider]  levothyroxine (SYNTHROID) 75 MCG tablet  11/22/19   [provider]  losartan (COZAAR) 100 MG tablet  10/19/19   [provider]  metFORMIN (GLUCOPHAGE-XR) 500 MG 24 hr tablet  11/01/19   [provider]   metoprolol succinate (TOPROL-XL) 50 MG 24 hr tablet  12/10/19   [provider]     Allergies:    Not on File   Physical Exam:   Vitals  Blood pressure (!) 173/74, pulse 98, temperature 97.9 F (36.6 C), temperature source Oral,  resp. rate (!) 28, height 5\' 10"  (1.778 m), weight 90.7 kg, SpO2 91 %.  1.  General: Patient sitting on side of bed, drinking a soda, no acute distress  2. Psychiatric: Mood and behavior are normal for situation, alert and oriented x3, pleasant, cooperative with exam  3. Neurologic: Cranial nerves II through XII intact, moves all 4 extremities voluntarily, equal sensation in the upper and lower extremities bilaterally, normal coordination with the upper extremities, normal coordination with the right lower extremity, difficulty maintaining proprioception and balance with attempt at left heel-to-shin  4. HEENMT:  Head is atraumatic, normocephalic, pupils are reactive, neck is supple, trachea is midline, mucous membranes are moist  5. Respiratory : Lungs are clear to auscultation bilaterally without wheezes, rhonchi, rales, no clubbing, no cyanosis  6. Cardiovascular : Heart rate is tachycardic but patient has just been moving in bed, rhythm is normal, no murmurs rubs or gallops  7. Gastrointestinal:  Abdomen is soft, nondistended, nontender to palpation  8. Skin:  Psoriasis present over lower back, no acute lesions skin is warm dry and intact  9.Musculoskeletal:  Acute changes in both hands, no calf tenderness, peripheral pulses palpated    Data Review:    CBC Recent Labs  Lab 11/17/20 1136  WBC 18.1*  HGB 13.4  HCT 39.6  PLT 327  MCV 95.4  MCH 32.3  MCHC 33.8  RDW 12.1  LYMPHSABS 0.9  MONOABS 1.3*  EOSABS 0.0  BASOSABS 0.1   ------------------------------------------------------------------------------------------------------------------  Results for orders placed or performed during the hospital encounter of 11/17/20  (from the past 48 hour(s))  Urinalysis, Routine w reflex microscopic Urine, Clean Catch     Status: Abnormal   Collection Time: 11/17/20 11:20 AM  Result Value Ref Range   Color, Urine YELLOW YELLOW   APPearance HAZY (A) CLEAR   Specific Gravity, Urine 1.014 1.005 - 1.030   pH 5.0 5.0 - 8.0   Glucose, UA >=500 (A) NEGATIVE mg/dL   Hgb urine dipstick SMALL (A) NEGATIVE   Bilirubin Urine NEGATIVE NEGATIVE   Ketones, ur 20 (A) NEGATIVE mg/dL   Protein, ur 628 (A) NEGATIVE mg/dL   Nitrite NEGATIVE NEGATIVE   Leukocytes,Ua MODERATE (A) NEGATIVE   RBC / HPF 0-5 0 - 5 RBC/hpf   WBC, UA >50 (H) 0 - 5 WBC/hpf   Bacteria, UA RARE (A) NONE SEEN   Squamous Epithelial / LPF 0-5 0 - 5   WBC Clumps PRESENT    Mucus PRESENT    Hyaline Casts, UA PRESENT     Comment: Performed at [facility], [address], [place], [place] [address]  CBC with Differential/Platelet     Status: Abnormal   Collection Time: 11/17/20 11:36 AM  Result Value Ref Range   WBC 18.1 (H) 4.0 - 10.5 K/uL   RBC 4.15 (L) 4.22 - 5.81 MIL/uL   Hemoglobin 13.4 13.0 - 17.0 g/dL   HCT 47.6 54.6 - 50.3 %   MCV 95.4 80.0 - 100.0 fL   MCH 32.3 26.0 - 34.0 pg   MCHC 33.8 30.0 - 36.0 g/dL   RDW 54.6 56.8 - 12.7 %   Platelets 327 150 - 400 K/uL   nRBC 0.0 0.0 - 0.2 %   Neutrophils Relative % 87 %   Neutro Abs 15.8 (H) 1.7 - 7.7 K/uL   Lymphocytes Relative 5 %   Lymphs Abs 0.9 0.7 - 4.0 K/uL   Monocytes Relative 7 %   Monocytes Absolute 1.3 (H) 0.1 - 1.0  K/uL   Eosinophils Relative 0 %   Eosinophils Absolute 0.0 0.0 - 0.5 K/uL   Basophils Relative 0 %   Basophils Absolute 0.1 0.0 - 0.1 K/uL   Immature Granulocytes 1 %   Abs Immature Granulocytes 0.12 (H) 0.00 - 0.07 K/uL    Comment: Performed at [facility], [address] [place], [place] [address]  Comprehensive metabolic panel     Status: Abnormal   Collection Time: 11/17/20 11:36 AM  Result Value Ref Range   Sodium 133 (L) 135 - 145 mmol/L   Potassium 4.4 3.5 - 5.1  mmol/L   Chloride 98 98 - 111 mmol/L   CO2 23 22 - 32 mmol/L   Glucose, Bld 346 (H) 70 - 99 mg/dL    Comment: Glucose reference range applies only to samples taken after fasting for at least 8 hours.   BUN 20 8 - 23 mg/dL   Creatinine, Ser 5.28 0.61 - 1.24 mg/dL   Calcium 9.3 8.9 - 41.3 mg/dL   Total Protein 7.5 6.5 - 8.1 g/dL   Albumin 3.1 (L) 3.5 - 5.0 g/dL   AST 14 (L) 15 - 41 U/L   ALT 20 0 - 44 U/L   Alkaline Phosphatase 112 38 - 126 U/L   Total Bilirubin 0.9 0.3 - 1.2 mg/dL   GFR, Estimated >24 >40 mL/min    Comment: (NOTE) Calculated using the CKD-EPI Creatinine Equation (2021)    Anion gap 12 5 - 15    Comment: Performed at [facility], [address], [place], [place] [address]    Chemistries  Recent Labs  Lab 11/17/20 1136  NA 133*  K 4.4  CL 98  CO2 23  GLUCOSE 346*  BUN 20  CREATININE 1.11  CALCIUM 9.3  AST 14*  ALT 20  ALKPHOS 112  BILITOT 0.9   ------------------------------------------------------------------------------------------------------------------  ------------------------------------------------------------------------------------------------------------------ GFR: Estimated Creatinine Clearance: 63.1 mL/min (by C-G formula based on SCr of 1.11 mg/dL). Liver Function Tests: Recent Labs  Lab 11/17/20 1136  AST 14*  ALT 20  ALKPHOS 112  BILITOT 0.9  PROT 7.5  ALBUMIN 3.1*   No results for input(s): LIPASE, AMYLASE in the last 168 hours. No results for input(s): AMMONIA in the last 168 hours. Coagulation Profile: No results for input(s): INR, PROTIME in the last 168 hours. Cardiac Enzymes: No results for input(s): CKTOTAL, CKMB, CKMBINDEX, TROPONINI in the last 168 hours. BNP (last 3 results) No results for input(s): PROBNP in the last 8760 hours. HbA1C: No results for input(s): HGBA1C in the last 72 hours. CBG: No results for input(s): GLUCAP in the last 168 hours. Lipid Profile: No results for input(s): CHOL, HDL, LDLCALC,  TRIG, CHOLHDL, LDLDIRECT in the last 72 hours. Thyroid Function Tests: No results for input(s): TSH, T4TOTAL, FREET4, T3FREE, THYROIDAB in the last 72 hours. Anemia Panel: No results for input(s): VITAMINB12, FOLATE, FERRITIN, TIBC, IRON, RETICCTPCT in the last 72 hours.  --------------------------------------------------------------------------------------------------------------- Urine analysis:    Component Value Date/Time   COLORURINE YELLOW 11/17/2020 1120   APPEARANCEUR HAZY (A) 11/17/2020 1120   LABSPEC 1.014 11/17/2020 1120   PHURINE 5.0 11/17/2020 1120   GLUCOSEU >=500 (A) 11/17/2020 1120   HGBUR SMALL (A) 11/17/2020 1120   BILIRUBINUR NEGATIVE 11/17/2020 1120   KETONESUR 20 (A) 11/17/2020 1120   PROTEINUR 100 (A) 11/17/2020 1120   UROBILINOGEN 0.2 05/22/2007 0025   NITRITE NEGATIVE 11/17/2020 1120   LEUKOCYTESUR MODERATE (A) 11/17/2020 1120      Imaging Results:  CT Head Wo Contrast  Result Date: 11/17/2020 CLINICAL DATA:  Dizziness EXAM: CT HEAD WITHOUT CONTRAST TECHNIQUE: Contiguous axial images were obtained from the base of the skull through the vertex without intravenous contrast. COMPARISON:  None. FINDINGS: Brain: No evidence of acute infarction, hemorrhage, hydrocephalus, extra-axial collection or mass lesion/mass effect. Small lacunar infarct within the right caudate head. Suspect prior infarct in the inferior aspect of the right cerebellar hemisphere. Moderate low-density changes within the periventricular and subcortical white matter compatible with chronic microvascular ischemic change. Mild diffuse cerebral volume loss. Vascular: Atherosclerotic calcifications involving the large vessels of the skull base. No unexpected hyperdense vessel. Skull: Arachnoid granulations noted adjacent to the dural venous sinuses. Negative for calvarial fracture. No suspicious calvarial lesion. Sinuses/Orbits: No acute finding. Other: None. IMPRESSION: 1. No evidence of an acute  intracranial process by CT. 2. Chronic microvascular ischemic change and cerebral volume loss. Electronically Signed   By: Duanne Guess D.O.   On: 11/17/2020 11:57   MR ANGIO HEAD WO CONTRAST  Result Date: 11/17/2020 CLINICAL DATA:  Acute presentation with dizziness. EXAM: MRI HEAD WITHOUT CONTRAST MRA HEAD WITHOUT CONTRAST TECHNIQUE: Multiplanar, multi-echo pulse sequences of the brain and surrounding structures were acquired without intravenous contrast. Angiographic images of the Circle of Willis were acquired using MRA technique without intravenous contrast. COMPARISON: No pertinent prior exam. COMPARISON:  Head CT same day. FINDINGS: MRI HEAD FINDINGS Brain: Acute infarction affects the inferior cerebellum on the right consistent with right PICA territory infarction. No evidence of mass effect or hemorrhage. No other acute infarction. Elsewhere, there are old small vessel infarctions within the left thalamus and affecting the cerebral hemispheric deep and subcortical white matter. No large vessel supratentorial infarction. No mass, hemorrhage, hydrocephalus or extra-axial collection. Vascular: Major vessels at the base of the brain show flow. However, there may be slow or diminished flow in the right vertebral artery. Skull and upper cervical spine: Negative Sinuses/Orbits: Clear sinuses. Orbits negative. Previous lens implants. Other: None MRA HEAD FINDINGS Anterior circulation: Both internal carotid arteries are patent through the skull base and siphon regions. The anterior and middle cerebral vessels show flow, but there are considerable stenotic changes within the MCA branch vessels, right more than left. Posterior circulation: Both vertebral arteries are patent to the basilar. However, the right V4 and V3 segments show narrowing and irregularity with serial stenoses. No basilar stenosis. Flow is seen within the proximal right PICA but not within more distal branches. Left PICA shows flow. No  basilar stenosis. Superior cerebellar and posterior cerebral arteries show flow. Right PCA takes fetal origin from the anterior circulation. Distal PCA branches show narrowing and irregularity. Anatomic variants: None other significant. IMPRESSION: Acute infarction in the right inferior cerebellum consistent with right PICA territory stroke. No other acute stroke. Chronic small-vessel ischemic changes elsewhere throughout the brain as outlined above. Narrowing and diminished flow within the right vertebral artery V3 and V4 segments. Flow seen in the proximal right PICA but not in more distal branches. Intracranial branch vessel atherosclerotic narrowing and irregularity, most notable in the PCA and MCA branches. Electronically Signed   By: Paulina Fusi M.D.   On: 11/17/2020 15:56   MR Brain Wo Contrast (neuro protocol)  Result Date: 11/17/2020 CLINICAL DATA:  Acute presentation with dizziness. EXAM: MRI HEAD WITHOUT CONTRAST MRA HEAD WITHOUT CONTRAST TECHNIQUE: Multiplanar, multi-echo pulse sequences of the brain and surrounding structures were acquired without intravenous contrast. Angiographic images of the Circle of Willis were acquired using MRA technique  without intravenous contrast. COMPARISON: No pertinent prior exam. COMPARISON:  Head CT same day. FINDINGS: MRI HEAD FINDINGS Brain: Acute infarction affects the inferior cerebellum on the right consistent with right PICA territory infarction. No evidence of mass effect or hemorrhage. No other acute infarction. Elsewhere, there are old small vessel infarctions within the left thalamus and affecting the cerebral hemispheric deep and subcortical white matter. No large vessel supratentorial infarction. No mass, hemorrhage, hydrocephalus or extra-axial collection. Vascular: Major vessels at the base of the brain show flow. However, there may be slow or diminished flow in the right vertebral artery. Skull and upper cervical spine: Negative Sinuses/Orbits: Clear  sinuses. Orbits negative. Previous lens implants. Other: None MRA HEAD FINDINGS Anterior circulation: Both internal carotid arteries are patent through the skull base and siphon regions. The anterior and middle cerebral vessels show flow, but there are considerable stenotic changes within the MCA branch vessels, right more than left. Posterior circulation: Both vertebral arteries are patent to the basilar. However, the right V4 and V3 segments show narrowing and irregularity with serial stenoses. No basilar stenosis. Flow is seen within the proximal right PICA but not within more distal branches. Left PICA shows flow. No basilar stenosis. Superior cerebellar and posterior cerebral arteries show flow. Right PCA takes fetal origin from the anterior circulation. Distal PCA branches show narrowing and irregularity. Anatomic variants: None other significant. IMPRESSION: Acute infarction in the right inferior cerebellum consistent with right PICA territory stroke. No other acute stroke. Chronic small-vessel ischemic changes elsewhere throughout the brain as outlined above. Narrowing and diminished flow within the right vertebral artery V3 and V4 segments. Flow seen in the proximal right PICA but not in more distal branches. Intracranial branch vessel atherosclerotic narrowing and irregularity, most notable in the PCA and MCA branches. Electronically Signed   By: Paulina Fusi M.D.   On: 11/17/2020 15:56   DG Chest Port 1 View  Result Date: 11/17/2020 CLINICAL DATA:  Dizziness EXAM: PORTABLE CHEST 1 VIEW COMPARISON:  07/03/2007 FINDINGS: Cardiac shadow is stable. Aortic calcifications are again seen. Postsurgical changes are noted. The lungs are well aerated bilaterally. Mild interstitial changes are noted which may be related to mild bronchitis. No focal confluent infiltrate is seen. No sizable effusion is noted. IMPRESSION: Mild increased bronchitic markings.  No focal infiltrate is seen. Electronically Signed   By:  Alcide Clever M.D.   On: 11/17/2020 10:57   CT Renal Stone Study  Result Date: 11/17/2020 CLINICAL DATA:  Flank pain, hematuria EXAM: CT ABDOMEN AND PELVIS WITHOUT CONTRAST TECHNIQUE: Multidetector CT imaging of the abdomen and pelvis was performed following the standard protocol without IV contrast. COMPARISON:  None. FINDINGS: Lower chest: No pleural or pericardial effusion. Coronary calcifications. Sternotomy wires. Hepatobiliary: No focal liver abnormality is seen. No gallstones, gallbladder wall thickening, or biliary dilatation. Pancreas: Unremarkable. No pancreatic ductal dilatation or surrounding inflammatory changes. Spleen: Normal in size without focal abnormality. Adrenals/Urinary Tract: Adrenal glands unremarkable. 6.1 cm probable cyst from the lower pole left kidney. 1.1 cm hyperdense lesion from the lower pole right kidney. 1 mm calculus in the upper pole left renal collecting system. No other urolithiasis. There is mild right pelvicaliectasis with edematous/inflammatory changes around the right kidney and proximal ureter. Urinary bladder is physiologically distended without calculus. Stomach/Bowel: Stomach and small bowel are nondistended. Appendix surgically absent. The colon is nondilated with scattered distal descending and sigmoid diverticula; no adjacent inflammatory change. Vascular/Lymphatic: Extensive aortoiliac calcified plaque without aneurysm. No abdominal or pelvic adenopathy. Reproductive:  Mild prostate enlargement. Other: Bilateral pelvic phleboliths.  No ascites.  No free air. Musculoskeletal: Sternotomy wires. No fracture or worrisome bone lesion. IMPRESSION: 1. Left nephrolithiasis. 2. Streaky inflammatory changes around the right kidney with pelvicaliectasis suggesting possible recent stone passage, versus UTI. 3. Descending and sigmoid diverticulosis. 4. Coronary and Aortic Atherosclerosis (ICD10-170.0). 5. Nonspecific 1.1 cm hyperdense right lower pole renal lesion. In the  absence of prior studies demonstrating stability, consider 1 year follow-up with renal protocol MR or CT with contrast. Electronically Signed   By: Corlis Leak M.D.   On: 11/17/2020 14:26    My personal review of EKG: RhythmST, Rate 119/min, QTc 455 ,no Acute ST changes   Assessment & Plan:    Principal Problem:   Stroke [facility]) Active Problems:   Essential hypertension   Diabetes mellitus type 2 in nonobese [facility])   Acute lower UTI   Mild protein-calorie malnutrition (HCC)   Thyroid disease   1. Rt Cerebellar stroke 1. MRI shows acute infarction of the right inferior cerebellum consistent with right PICA stroke 2. Consult neurology 3. PT/OT/ST consult 4. Echo and ultrasound carotids in a.m. 5. Monitor on telemetry 6. Lipid panel in the a.m. 7. Aspirin given in the ED 8. Ischemic stroke order set utilized 2. UTI 1. Patient reports dysuria and frequency 2. CT renal study shows possibly recently passed stone and/or UTI 3. Rocephin started in the ED 4. Continue Rocephin 5. Urine culture pending 6. No recent urine culture for comparison 3. HTN 1. Continue diltiazem, losartan, metoprolol 2. Continue to monitor 4. DMII 1. Hold metformin 2. Hyperglycemic in the ED at 346 3. Sliding scale coverage 4. Carb modified diet 5. Thyroid disease  1. Continue Synthroid 6. Mild protein cal malnutrition 1. Albumin 3.1 2. Consistent with patient's report of decreased appetite 3. Encourage nutrient dense food intake 7.    DVT Prophylaxis-   Heparin- SCDs  AM Labs Ordered, also please review Full Orders  Family Communication: Admission, patients condition and plan of care including tests being ordered have been discussed with the patient and brother who indicate understanding and agree with the plan and Code Status.  Code Status:  DNR  Admission status: Observation  Time spent in minutes : 65  Jarelly Rinck B Zierle-Ghosh DO

## 2020-11-17 NOTE — Discharge Instructions (Addendum)
Follow-up with your doctor.  Take the antibiotic as directed for the next 10 days.  Urine sent for culture.  Return for any new or worse symptoms.  Work-up here today raises concerns about a urinary tract infection or kidney infection.  Also referral information provided urology can call and set up an appointment with them.

## 2020-11-18 ENCOUNTER — Observation Stay (HOSPITAL_BASED_OUTPATIENT_CLINIC_OR_DEPARTMENT_OTHER): Payer: Medicare Other

## 2020-11-18 ENCOUNTER — Observation Stay (HOSPITAL_COMMUNITY): Payer: Medicare Other

## 2020-11-18 DIAGNOSIS — I639 Cerebral infarction, unspecified: Secondary | ICD-10-CM | POA: Diagnosis not present

## 2020-11-18 DIAGNOSIS — E119 Type 2 diabetes mellitus without complications: Secondary | ICD-10-CM

## 2020-11-18 DIAGNOSIS — E785 Hyperlipidemia, unspecified: Secondary | ICD-10-CM | POA: Diagnosis not present

## 2020-11-18 DIAGNOSIS — E079 Disorder of thyroid, unspecified: Secondary | ICD-10-CM

## 2020-11-18 DIAGNOSIS — N3000 Acute cystitis without hematuria: Secondary | ICD-10-CM | POA: Diagnosis not present

## 2020-11-18 DIAGNOSIS — I6389 Other cerebral infarction: Secondary | ICD-10-CM | POA: Diagnosis not present

## 2020-11-18 DIAGNOSIS — E441 Mild protein-calorie malnutrition: Secondary | ICD-10-CM

## 2020-11-18 DIAGNOSIS — I10 Essential (primary) hypertension: Secondary | ICD-10-CM | POA: Diagnosis not present

## 2020-11-18 DIAGNOSIS — I771 Stricture of artery: Secondary | ICD-10-CM | POA: Diagnosis not present

## 2020-11-18 DIAGNOSIS — I6523 Occlusion and stenosis of bilateral carotid arteries: Secondary | ICD-10-CM | POA: Diagnosis not present

## 2020-11-18 DIAGNOSIS — Z8673 Personal history of transient ischemic attack (TIA), and cerebral infarction without residual deficits: Secondary | ICD-10-CM | POA: Diagnosis not present

## 2020-11-18 LAB — CBC
HCT: 37.3 % — ABNORMAL LOW (ref 39.0–52.0)
Hemoglobin: 12.4 g/dL — ABNORMAL LOW (ref 13.0–17.0)
MCH: 31.8 pg (ref 26.0–34.0)
MCHC: 33.2 g/dL (ref 30.0–36.0)
MCV: 95.6 fL (ref 80.0–100.0)
Platelets: 314 10*3/uL (ref 150–400)
RBC: 3.9 MIL/uL — ABNORMAL LOW (ref 4.22–5.81)
RDW: 12.1 % (ref 11.5–15.5)
WBC: 16.6 10*3/uL — ABNORMAL HIGH (ref 4.0–10.5)
nRBC: 0 % (ref 0.0–0.2)

## 2020-11-18 LAB — GLUCOSE, CAPILLARY
Glucose-Capillary: 203 mg/dL — ABNORMAL HIGH (ref 70–99)
Glucose-Capillary: 220 mg/dL — ABNORMAL HIGH (ref 70–99)
Glucose-Capillary: 142 mg/dL — ABNORMAL HIGH (ref 70–99)

## 2020-11-18 LAB — LIPID PANEL
Cholesterol: 199 mg/dL (ref 0–200)
HDL: 35 mg/dL — ABNORMAL LOW (ref >40)
LDL Cholesterol: 126 mg/dL — ABNORMAL HIGH (ref 0–99)
Total CHOL/HDL Ratio: 5.7 RATIO
Triglycerides: 190 mg/dL — ABNORMAL HIGH (ref <150)
VLDL: 38 mg/dL (ref 0–40)

## 2020-11-18 LAB — SARS CORONAVIRUS 2 (TAT 6-24 HRS): SARS Coronavirus 2: NEGATIVE

## 2020-11-18 LAB — COMPREHENSIVE METABOLIC PANEL
ALT: 18 U/L (ref 0–44)
AST: 13 U/L — ABNORMAL LOW (ref 15–41)
Albumin: 2.6 g/dL — ABNORMAL LOW (ref 3.5–5.0)
Alkaline Phosphatase: 94 U/L (ref 38–126)
Anion gap: 10 (ref 5–15)
BUN: 15 mg/dL (ref 8–23)
CO2: 25 mmol/L (ref 22–32)
Calcium: 9 mg/dL (ref 8.9–10.3)
Chloride: 103 mmol/L (ref 98–111)
Creatinine, Ser: 0.91 mg/dL (ref 0.61–1.24)
GFR, Estimated: >60 mL/min (ref >60)
Glucose, Bld: 260 mg/dL — ABNORMAL HIGH (ref 70–99)
Potassium: 3.9 mmol/L (ref 3.5–5.1)
Sodium: 138 mmol/L (ref 135–145)
Total Bilirubin: 0.5 mg/dL (ref 0.3–1.2)
Total Protein: 6.6 g/dL (ref 6.5–8.1)

## 2020-11-18 LAB — ECHOCARDIOGRAM COMPLETE
Area-P 1/2: 4.86 cm2
Height: 70 in
S' Lateral: 2.47 cm
Weight: 2931.24 oz

## 2020-11-18 IMAGING — US US CAROTID DUPLEX BILAT
2 of 3 series · 13 of 24 positions shown · non-contrast
Comparison: MRI [DATE]

CLINICAL DATA: 75-year-old with acute infarction in the right
inferior cerebellum.

EXAM:
BILATERAL CAROTID DUPLEX ULTRASOUND
TECHNIQUE: Gray scale imaging, color Doppler and duplex ultrasound were
performed of bilateral carotid and vertebral arteries in the neck.

[Series 1: us carotid bilateral · 12 of 68 slices shown]
[im 1/68]
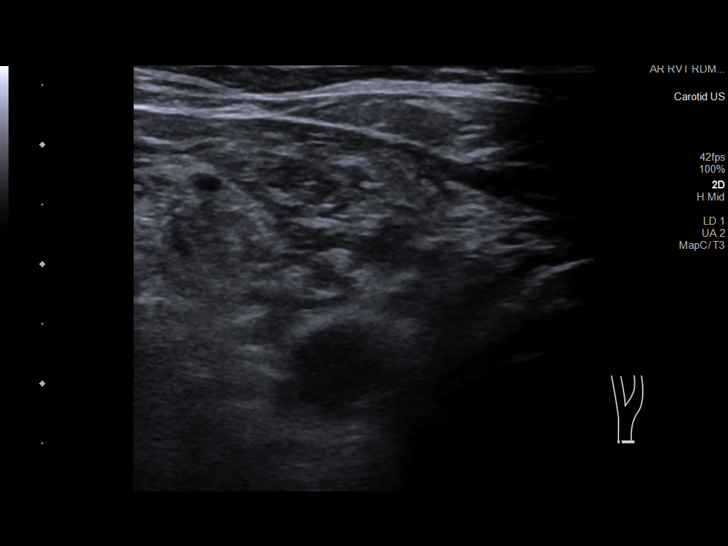
[im 7/68]
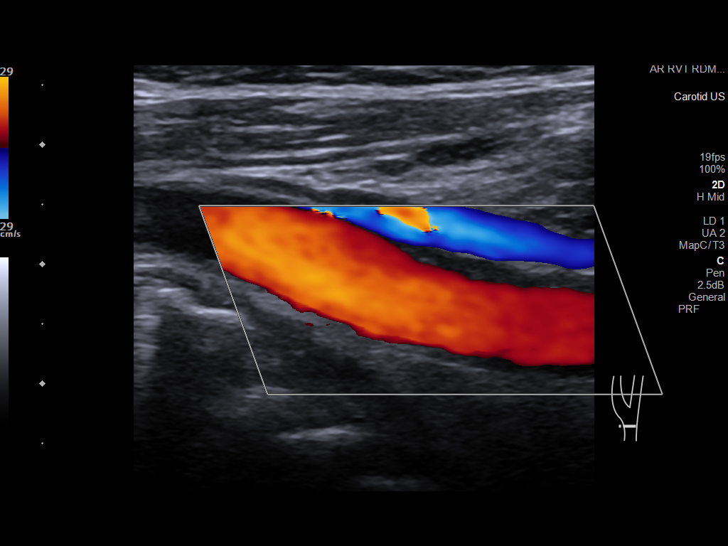
[im 13/68]
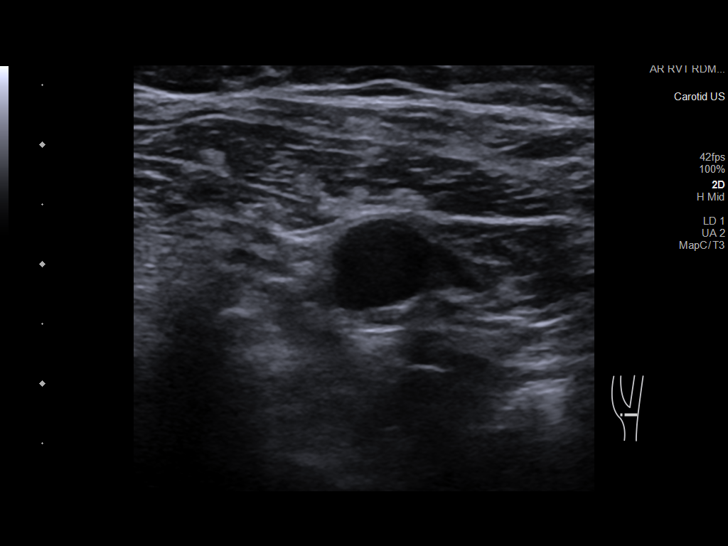
[im 20/68]
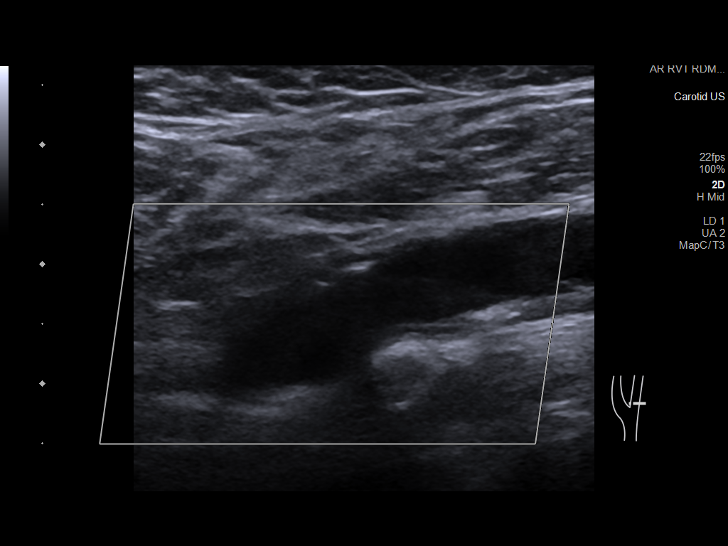
[im 26/68]
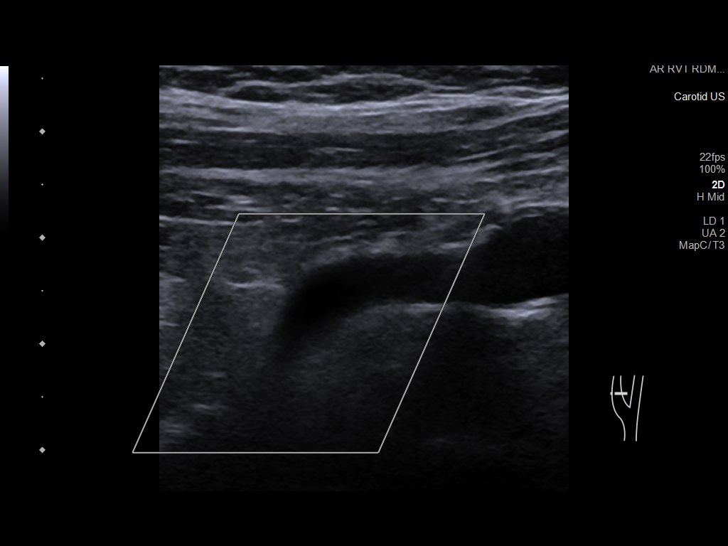
[im 32/68]
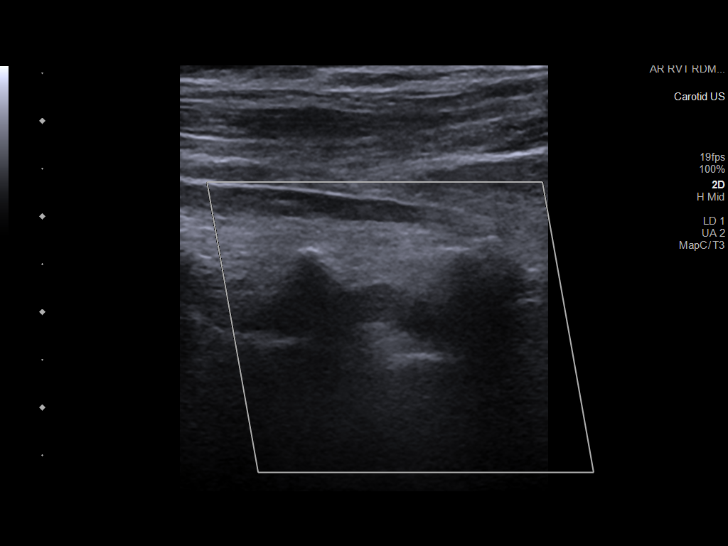
[im 39/68]
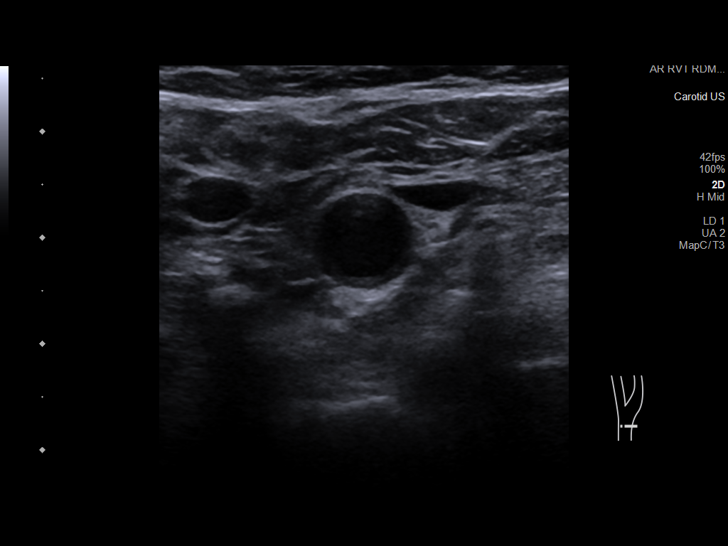
[im 42/68]
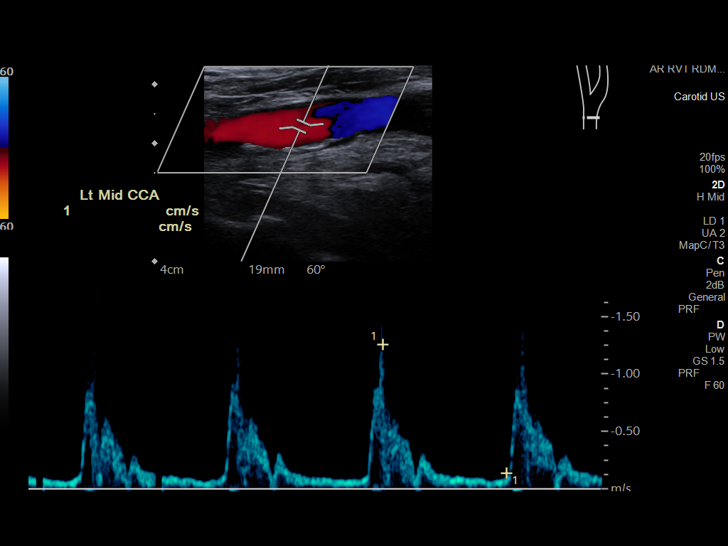
[im 48/68]
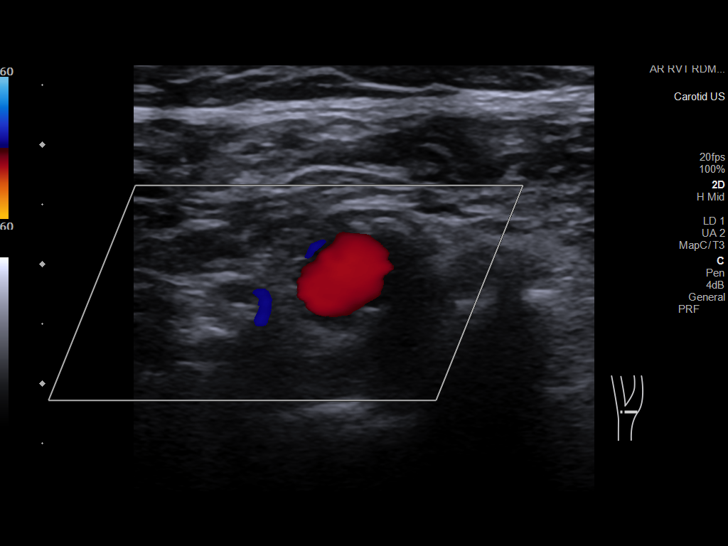
[im 55/68]
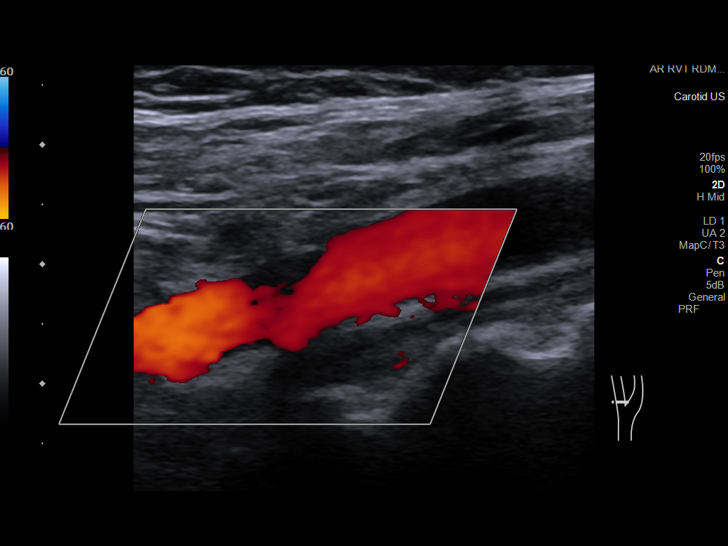
[im 61/68]
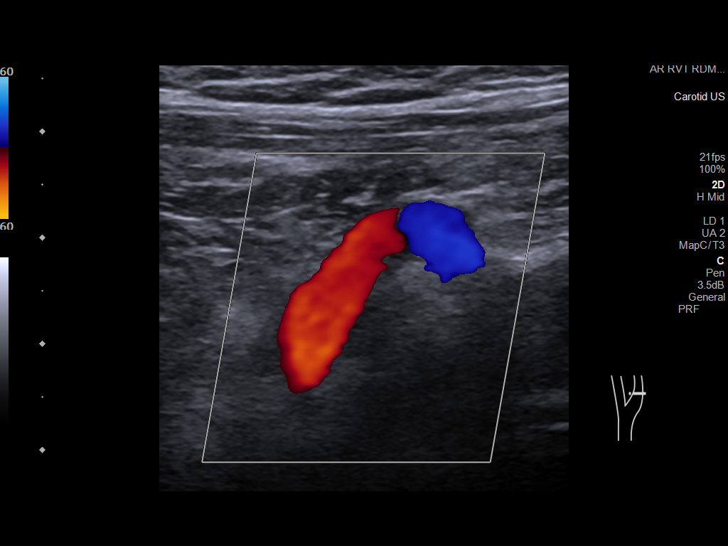
[im 68/68]
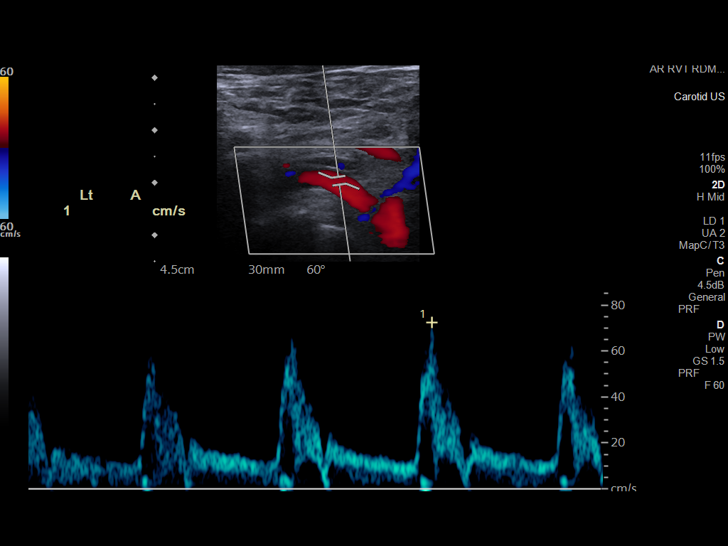

[Series 1002: carotid us · 1 of 1 slices shown]
[im 1/1]
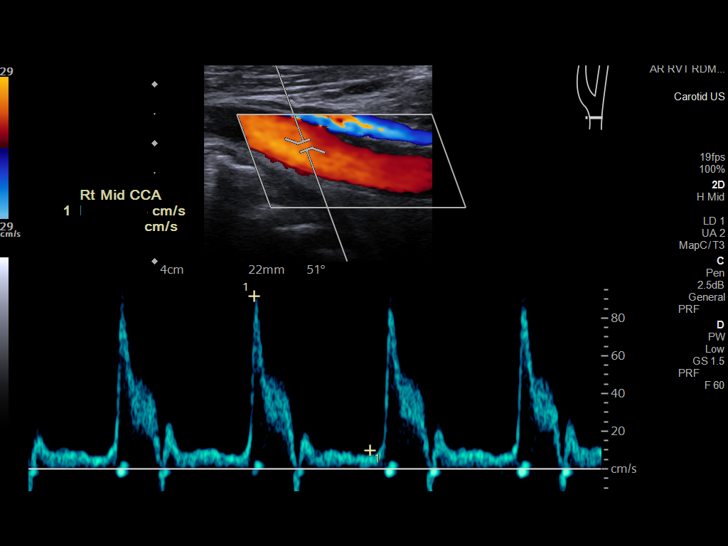

[13 of 24 positions shown; findings below may reference images not displayed]

FINDINGS: Criteria: Quantification of carotid stenosis is based on velocity
parameters that correlate the residual internal carotid diameter
with NASCET-based stenosis levels, using the diameter of the distal
internal carotid lumen as the denominator for stenosis measurement.

The following velocity measurements were obtained:

RIGHT

ICA: 73/18 cm/sec

CCA: 92/10 cm/sec

SYSTOLIC ICA/CCA RATIO:

ECA: 81 cm/sec

LEFT

ICA: 119/28 cm/sec

CCA: 126/14 cm/sec

SYSTOLIC ICA/CCA RATIO:

ECA: 126 cm/sec

RIGHT CAROTID ARTERY: Diffuse echogenic plaque at the right common
carotid artery. No significant velocity elevations in the right
common carotid artery. Small amount of echogenic plaque at the
carotid bulb. External carotid artery is patent with normal
waveform. Normal waveforms and velocities in the internal carotid
artery.

RIGHT VERTEBRAL ARTERY: Antegrade flow and normal waveform in the
right vertebral artery.

LEFT CAROTID ARTERY: Proximal left common carotid artery is very
tortuous. Echogenic plaque in the left common carotid artery without
significant velocity elevation. Echogenic plaque at the left carotid
bulb. External carotid artery is patent with normal waveform. Left
internal carotid artery is tortuous without significant stenosis.
Normal waveforms and velocities in the internal carotid artery.

LEFT VERTEBRAL ARTERY: Antegrade flow and normal waveform in the
left vertebral artery.
IMPRESSION: 1. Atherosclerotic plaque involving bilateral carotid arteries.
Estimated degree of stenosis in the internal carotid arteries is
less than 50% bilaterally.
2. Patent vertebral arteries with antegrade flow.

## 2020-11-18 MED ORDER — PANTOPRAZOLE SODIUM 40 MG PO TBEC
40.0000 mg | DELAYED_RELEASE_TABLET | Freq: Every day | ORAL | Status: DC
  Administered 2020-11-18: 40 mg via ORAL
  Filled 2020-11-18: qty 1

## 2020-11-18 MED ORDER — ASPIRIN 325 MG PO TABS
325.0000 mg | ORAL_TABLET | Freq: Every day | ORAL | Status: DC
  Administered 2020-11-18: 325 mg via ORAL
  Filled 2020-11-18: qty 1

## 2020-11-18 MED ORDER — CLOPIDOGREL BISULFATE 75 MG PO TABS: 75.0000 mg | ORAL_TABLET | Freq: Every day | ORAL | 1 refills | Status: DC

## 2020-11-18 MED ORDER — DILTIAZEM HCL ER COATED BEADS 360 MG PO CP24: 360.0000 mg | ORAL_CAPSULE | Freq: Every day | ORAL | 11 refills | Status: DC

## 2020-11-18 MED ORDER — ATORVASTATIN CALCIUM 40 MG PO TABS: 40.0000 mg | ORAL_TABLET | Freq: Every day | ORAL | 11 refills | Status: DC

## 2020-11-18 MED ORDER — ASPIRIN EC 81 MG PO TBEC: 81.0000 mg | DELAYED_RELEASE_TABLET | Freq: Every day | ORAL | 2 refills | Status: DC

## 2020-11-18 MED ORDER — PANTOPRAZOLE SODIUM 40 MG PO TBEC: 40.0000 mg | DELAYED_RELEASE_TABLET | Freq: Every day | ORAL | 1 refills | Status: DC

## 2020-11-18 NOTE — Discharge Summary (Signed)
Physician Discharge Summary  Jason Cox VPX:[id number] DOB: [birth year] DOA: 11/17/2020  PCP: Elfredia Nevins, MD  Admit date: 11/17/2020 Discharge date: 11/18/2020  Time spent: 35 minutes  Recommendations for Outpatient Follow-up:  1. Repeat basic metabolic panel to follow-up lites and renal function 2. Reassess blood pressure and adjust antihypertensive regimen as needed 3. Repeat complete metabolic panel and lipid panel in 6 to 8 weeks with further adjustments to medication therapy as required. 4. Reassess resolution of patient's urinary symptoms after completion of antibiotics.   Discharge Diagnoses:  Principal Problem:   Acute stroke due to ischemia [facility]) Active Problems:   Essential hypertension   Diabetes mellitus type 2 in nonobese (HCC)   Acute lower UTI   Mild protein-calorie malnutrition (HCC)   Thyroid disease   Acute cystitis without hematuria   Hyperlipidemia   Discharge Condition: Stable and improved.  Discharged home with instruction to follow-up with PCP, urology and neurology service as an outpatient.  Home health services for PT and OT requested at discharge.  CODE STATUS: DNR  Diet recommendation: Healthy and modified carbohydrate diet  Filed Weights   11/17/20 0936 11/17/20 1720  Weight: 90.7 kg 83.1 kg    History of present illness:  As per H&P written by Dr. Carren Rang on 11/17/20 Jason Cox  is a 75 y.o. male, with history of hypertension, coronary artery disease, diabetes mellitus type 2, thyroid disease, presents the ED with a chief complaint of dizziness.  Patient reports that he has had intermittent dizziness for the past couple of weeks.  He reports that there are no provoking or relieving factors, and it is entirely unpredictable.  Patient reports that at first he did not think anything of it because he has a history of vertigo.  For the past couple of weeks this dizziness does not seem to different than his vertigo.  Today it was much  worse.  He reports it as feeling off balance, neither the room nor himself feel like they are spinning.  He was in the kitchen when he had multiple falls today, 1 against the sink, 1 against the stove, and more that he cannot remember to describe.  He reports that he did not hit his head and did not lose consciousness.  He reports that he was afraid to walk in the middle of the room because he knew he would need something to grab onto if he started to fall.  He is not sure if he was falling to one side more than the other or not.  He denies any blurry vision, change in hearing, chest pain, palpitations, weakness or numbness in any extremity, trouble speaking, trouble swallowing, drooling, facial droop.  He does admit to numbness around his mouth that was present today and he is not sure when it went away.  Patient ambulates with a cane, but did not feel safe getting around the home with these falls even with a cane.  This is the reason that patient called EMS to be transported to the ER today.  He also complains of dysuria.  Dysuria started about a week ago, and then resolved a few days later when he started drinking V8.  He has continued to have frequency, but the dysuria is gone.  He has not noticed hematuria.  He reports he has had UTIs in the past, and that this felt like a UTI which is why he started drinking the V8.  He was not prescribed an antibiotic in the outpatient setting for the symptoms.  On review of systems patient also complains of decreased appetite over the last week.  He reports that he has not been eating, and he knows that it is wrong in the setting of diabetes mellitus type 2.  He reports that he feels dehydrated secondary to poor p.o. intake.  Patient is a former smoker, does not drink alcohol, does not use illicit drugs.  Patient has had 1 dose of COVID-vaccine.  Patient prefers to be DNR.  In the ED Temp 97.9, heart rate 70-111, respiratory rate 78, blood pressure 154/84, satting at  91% White blood cell count 18,000, hemoglobin 13.4 Chemistry panel shows a pseudohyponatremia 133 that corrects for the sugar of 346 Anion gap is 12 Albumin 3.1 CT head shows no evidence of an acute intracranial process by CT CT renal study shows left nephrolithiasis with streaky inflammatory changes around the right kidney suggesting possible recent stone passage MRI/MRA brain shows acute infarction of right anterior cerebellum consistent with right PICA stroke territory Patient was started on aspirin UA was indicative of UTI, urine culture pending, Rocephin started  Hospital Course:  1-acute ischemic right cerebellar stroke -MRI demonstrating acute ischemic infarct and patient PICA territory -Out of therapeutic window for intervention -Echo and carotid Dopplers not demonstrating significant abnormality -Case reviewed with neurology service who recommended dual antiplatelet therapy for 1 month and aspirin daily after that. -LDL demonstrated to be in the 126 range and patient has been discharged on Lipitor. -Outpatient follow-up with neurology in 4 weeks. -PT/OT recommending home health services at discharge.  2-UTI -Patient received Rocephin x2 days while inpatient and discharged home on Keflex to complete antibiotic therapy -Outpatient follow-up with PCP and neurology as previously recommended. -Advised to maintain adequate hydration  3-hypertension -Stable and well-controlled -Continue home antihypertensive agents -Patient advised to follow heart healthy diet.  4-type 2 diabetes mellitus -Continue modified carbohydrate diet and resume home hypoglycemic regimen -Close monitoring recommended to further adjust therapy as needed for better control.  5-hypothyroidism -Continue Synthroid  6-hyperlipidemia -As mentioned above patient has been started on statins. -Repeat LFTs and lipid panel in 8 to 12 weeks with further adjustment to medication management as  required.   Procedures:  See below for x-ray reports  2D echo: 1. Left ventricular ejection fraction, by estimation, is 60 to 65%. The  left ventricle has normal function. The left ventricle has no regional  wall motion abnormalities. There is mild left ventricular hypertrophy.  Left ventricular diastolic parameters  are consistent with Grade I diastolic dysfunction (impaired relaxation).  2. Right ventricular systolic function is normal. The right ventricular  size is normal. Tricuspid regurgitation signal is inadequate for assessing  PA pressure.  3. Left atrial size was mildly dilated.  4. The mitral valve is grossly normal. Mild mitral valve regurgitation.  5. The aortic valve is tricuspid. Aortic valve regurgitation is mild.  6. The inferior vena cava is normal in size with greater than 50%  respiratory variability, suggesting right atrial pressure of 3 mmHg.    Consultations:  Neurology service (Dr. Gerilyn Pilgrim).  Discharge Exam: Vitals:   11/18/20 0829 11/18/20 1301  BP: (!) 166/81 125/62  Pulse: 86 81  Resp:  18  Temp:  98.1 F (36.7 C)  SpO2:  97%    General: Alert, awake and oriented x3; reports no chest pain, no fever, no nausea, no vomiting.  No dysarthria appreciated.  Cardiovascular: S1 and S2, no rubs, no gallops, no JVD. Respiratory: Clear to auscultation bilaterally; no using accessory muscles;  good saturation on room air. Abdomen: Soft, nontender, nondistended, positive bowel sounds Extremities: No cyanosis or clubbing; multiple joint abnormality/deformities secondary to underlying history of arthritis  Discharge Instructions   Discharge Instructions    Diet - low sodium heart healthy   Complete by: As directed    Increase activity slowly   Complete by: As directed      Allergies as of 11/18/2020   Not on File     Medication List    TAKE these medications   amLODipine 10 MG tablet Commonly known as: NORVASC   aspirin EC 81 MG  tablet Take 1 tablet (81 mg total) by mouth daily. Swallow whole.   atorvastatin 40 MG tablet Commonly known as: Lipitor Take 1 tablet (40 mg total) by mouth daily.   cephALEXin 500 MG capsule Commonly known as: KEFLEX Take 1 capsule (500 mg total) by mouth 4 (four) times daily for 10 days.   clopidogrel 75 MG tablet Commonly known as: Plavix Take 1 tablet (75 mg total) by mouth daily.   diltiazem 360 MG 24 hr capsule Commonly known as: Cardizem CD Take 1 capsule (360 mg total) by mouth daily. What changed:   medication strength  how much to take  how to take this  when to take this  Another medication with the same name was removed. Continue taking this medication, and follow the directions you see here.   indomethacin 25 MG capsule Commonly known as: INDOCIN Take 25 mg by mouth 4 (four) times daily as needed.   levothyroxine 75 MCG tablet Commonly known as: SYNTHROID   losartan 100 MG tablet Commonly known as: COZAAR   metFORMIN 500 MG 24 hr tablet Commonly known as: GLUCOPHAGE-XR   metoprolol succinate 50 MG 24 hr tablet Commonly known as: TOPROL-XL   pantoprazole 40 MG tablet Commonly known as: PROTONIX Take 1 tablet (40 mg total) by mouth daily. Start taking on: Nov 19, 2020      Not on File  Follow-up Information    Schedule an appointment as soon as possible for a visit  with Elfredia Nevins, MD.   Specialty: Internal Medicine Contact information: [address] [place] [address] [phone]        Schedule an appointment as soon as possible for a visit  with Jerilee Field, MD.   Specialty: Urology Contact information: [address] [place] [address] [phone]        Beryle Beams, MD. Schedule an appointment as soon as possible for a visit in 4 week(s).   Specialty: Neurology Contact information: [address] [place] [address] [phone]               The results of significant  diagnostics from this hospitalization (including imaging, microbiology, ancillary and laboratory) are listed below for reference.    Significant Diagnostic Studies: CT Head Wo Contrast  Result Date: 11/17/2020 CLINICAL DATA:  Dizziness EXAM: CT HEAD WITHOUT CONTRAST TECHNIQUE: Contiguous axial images were obtained from the base of the skull through the vertex without intravenous contrast. COMPARISON:  None. FINDINGS: Brain: No evidence of acute infarction, hemorrhage, hydrocephalus, extra-axial collection or mass lesion/mass effect. Small lacunar infarct within the right caudate head. Suspect prior infarct in the inferior aspect of the right cerebellar hemisphere. Moderate low-density changes within the periventricular and subcortical white matter compatible with chronic microvascular ischemic change. Mild diffuse cerebral volume loss. Vascular: Atherosclerotic calcifications involving the large vessels of the skull base. No unexpected hyperdense vessel. Skull: Arachnoid granulations  noted adjacent to the dural venous sinuses. Negative for calvarial fracture. No suspicious calvarial lesion. Sinuses/Orbits: No acute finding. Other: None. IMPRESSION: 1. No evidence of an acute intracranial process by CT. 2. Chronic microvascular ischemic change and cerebral volume loss. Electronically Signed   By: Duanne Guess D.O.   On: 11/17/2020 11:57   MR ANGIO HEAD WO CONTRAST  Result Date: 11/17/2020 CLINICAL DATA:  Acute presentation with dizziness. EXAM: MRI HEAD WITHOUT CONTRAST MRA HEAD WITHOUT CONTRAST TECHNIQUE: Multiplanar, multi-echo pulse sequences of the brain and surrounding structures were acquired without intravenous contrast. Angiographic images of the Circle of Willis were acquired using MRA technique without intravenous contrast. COMPARISON: No pertinent prior exam. COMPARISON:  Head CT same day. FINDINGS: MRI HEAD FINDINGS Brain: Acute infarction affects the inferior cerebellum on the right  consistent with right PICA territory infarction. No evidence of mass effect or hemorrhage. No other acute infarction. Elsewhere, there are old small vessel infarctions within the left thalamus and affecting the cerebral hemispheric deep and subcortical white matter. No large vessel supratentorial infarction. No mass, hemorrhage, hydrocephalus or extra-axial collection. Vascular: Major vessels at the base of the brain show flow. However, there may be slow or diminished flow in the right vertebral artery. Skull and upper cervical spine: Negative Sinuses/Orbits: Clear sinuses. Orbits negative. Previous lens implants. Other: None MRA HEAD FINDINGS Anterior circulation: Both internal carotid arteries are patent through the skull base and siphon regions. The anterior and middle cerebral vessels show flow, but there are considerable stenotic changes within the MCA branch vessels, right more than left. Posterior circulation: Both vertebral arteries are patent to the basilar. However, the right V4 and V3 segments show narrowing and irregularity with serial stenoses. No basilar stenosis. Flow is seen within the proximal right PICA but not within more distal branches. Left PICA shows flow. No basilar stenosis. Superior cerebellar and posterior cerebral arteries show flow. Right PCA takes fetal origin from the anterior circulation. Distal PCA branches show narrowing and irregularity. Anatomic variants: None other significant. IMPRESSION: Acute infarction in the right inferior cerebellum consistent with right PICA territory stroke. No other acute stroke. Chronic small-vessel ischemic changes elsewhere throughout the brain as outlined above. Narrowing and diminished flow within the right vertebral artery V3 and V4 segments. Flow seen in the proximal right PICA but not in more distal branches. Intracranial branch vessel atherosclerotic narrowing and irregularity, most notable in the PCA and MCA branches. Electronically Signed    By: Paulina Fusi M.D.   On: 11/17/2020 15:56   MR Brain Wo Contrast (neuro protocol)  Result Date: 11/17/2020 CLINICAL DATA:  Acute presentation with dizziness. EXAM: MRI HEAD WITHOUT CONTRAST MRA HEAD WITHOUT CONTRAST TECHNIQUE: Multiplanar, multi-echo pulse sequences of the brain and surrounding structures were acquired without intravenous contrast. Angiographic images of the Circle of Willis were acquired using MRA technique without intravenous contrast. COMPARISON: No pertinent prior exam. COMPARISON:  Head CT same day. FINDINGS: MRI HEAD FINDINGS Brain: Acute infarction affects the inferior cerebellum on the right consistent with right PICA territory infarction. No evidence of mass effect or hemorrhage. No other acute infarction. Elsewhere, there are old small vessel infarctions within the left thalamus and affecting the cerebral hemispheric deep and subcortical white matter. No large vessel supratentorial infarction. No mass, hemorrhage, hydrocephalus or extra-axial collection. Vascular: Major vessels at the base of the brain show flow. However, there may be slow or diminished flow in the right vertebral artery. Skull and upper cervical spine: Negative Sinuses/Orbits: Clear sinuses. Orbits  negative. Previous lens implants. Other: None MRA HEAD FINDINGS Anterior circulation: Both internal carotid arteries are patent through the skull base and siphon regions. The anterior and middle cerebral vessels show flow, but there are considerable stenotic changes within the MCA branch vessels, right more than left. Posterior circulation: Both vertebral arteries are patent to the basilar. However, the right V4 and V3 segments show narrowing and irregularity with serial stenoses. No basilar stenosis. Flow is seen within the proximal right PICA but not within more distal branches. Left PICA shows flow. No basilar stenosis. Superior cerebellar and posterior cerebral arteries show flow. Right PCA takes fetal origin from  the anterior circulation. Distal PCA branches show narrowing and irregularity. Anatomic variants: None other significant. IMPRESSION: Acute infarction in the right inferior cerebellum consistent with right PICA territory stroke. No other acute stroke. Chronic small-vessel ischemic changes elsewhere throughout the brain as outlined above. Narrowing and diminished flow within the right vertebral artery V3 and V4 segments. Flow seen in the proximal right PICA but not in more distal branches. Intracranial branch vessel atherosclerotic narrowing and irregularity, most notable in the PCA and MCA branches. Electronically Signed   By: Paulina Fusi M.D.   On: 11/17/2020 15:56   US Carotid Bilateral (at [facility] and [facility] only)  Result Date: 11/18/2020 CLINICAL DATA:  75 year old with acute infarction in the right inferior cerebellum. EXAM: BILATERAL CAROTID DUPLEX ULTRASOUND TECHNIQUE: Wallace Cullens scale imaging, color Doppler and duplex ultrasound were performed of bilateral carotid and vertebral arteries in the neck. COMPARISON:  MRI 11/17/2020 FINDINGS: Criteria: Quantification of carotid stenosis is based on velocity parameters that correlate the residual internal carotid diameter with NASCET-based stenosis levels, using the diameter of the distal internal carotid lumen as the denominator for stenosis measurement. The following velocity measurements were obtained: RIGHT ICA: 73/18 cm/sec CCA: 92/10 cm/sec SYSTOLIC ICA/CCA RATIO:  0.8 ECA: 81 cm/sec LEFT ICA: 119/28 cm/sec CCA: 126/14 cm/sec SYSTOLIC ICA/CCA RATIO:  0.9 ECA: 126 cm/sec RIGHT CAROTID ARTERY: Diffuse echogenic plaque at the right common carotid artery. No significant velocity elevations in the right common carotid artery. Small amount of echogenic plaque at the carotid bulb. External carotid artery is patent with normal waveform. Normal waveforms and velocities in the internal carotid artery. RIGHT VERTEBRAL ARTERY: Antegrade flow and normal waveform in the right  vertebral artery. LEFT CAROTID ARTERY: Proximal left common carotid artery is very tortuous. Echogenic plaque in the left common carotid artery without significant velocity elevation. Echogenic plaque at the left carotid bulb. External carotid artery is patent with normal waveform. Left internal carotid artery is tortuous without significant stenosis. Normal waveforms and velocities in the internal carotid artery. LEFT VERTEBRAL ARTERY: Antegrade flow and normal waveform in the left vertebral artery. IMPRESSION: 1. Atherosclerotic plaque involving bilateral carotid arteries. Estimated degree of stenosis in the internal carotid arteries is less than 50% bilaterally. 2. Patent vertebral arteries with antegrade flow. Electronically Signed   By: Richarda Overlie M.D.   On: 11/18/2020 13:15   DG Chest Port 1 View  Result Date: 11/17/2020 CLINICAL DATA:  Dizziness EXAM: PORTABLE CHEST 1 VIEW COMPARISON:  07/03/2007 FINDINGS: Cardiac shadow is stable. Aortic calcifications are again seen. Postsurgical changes are noted. The lungs are well aerated bilaterally. Mild interstitial changes are noted which may be related to mild bronchitis. No focal confluent infiltrate is seen. No sizable effusion is noted. IMPRESSION: Mild increased bronchitic markings.  No focal infiltrate is seen. Electronically Signed   By: Alcide Clever M.D.   On:  11/17/2020 10:57   ECHOCARDIOGRAM COMPLETE  Result Date: 11/18/2020    ECHOCARDIOGRAM REPORT   Patient Name:   JESUA TAMBLYN Date of Exam: 11/18/2020 Medical Rec #:  [id number]       Height:       70.0 in Accession #:    [id number]      Weight:       183.2 lb Date of Birth:  Jun 30, 1945        BSA:          2.011 m Patient Age:    75 years        BP:           166/81 mmHg Patient Gender: M               HR:           86 bpm. [facility] Procedure: 2D Echo Indications:    Stroke I63.9  History:        Patient has prior history of Echocardiogram examinations, most                  recent 05/23/2007. Stroke; Risk Factors:Hypertension, Diabetes                 and Former Smoker.  Sonographer:    Jeryl Columbia RDCS (AE) Referring Phys: [id number] ASIA B ZIERLE-GHOSH IMPRESSIONS  1. Left ventricular ejection fraction, by estimation, is 60 to 65%. The left ventricle has normal function. The left ventricle has no regional wall motion abnormalities. There is mild left ventricular hypertrophy. Left ventricular diastolic parameters are consistent with Grade I diastolic dysfunction (impaired relaxation).  2. Right ventricular systolic function is normal. The right ventricular size is normal. Tricuspid regurgitation signal is inadequate for assessing PA pressure.  3. Left atrial size was mildly dilated.  4. The mitral valve is grossly normal. Mild mitral valve regurgitation.  5. The aortic valve is tricuspid. Aortic valve regurgitation is mild.  6. The inferior vena cava is normal in size with greater than 50% respiratory variability, suggesting right atrial pressure of 3 mmHg. FINDINGS  Left Ventricle: Left ventricular ejection fraction, by estimation, is 60 to 65%. The left ventricle has normal function. The left ventricle has no regional wall motion abnormalities. The left ventricular internal cavity size was normal in size. There is  mild left ventricular hypertrophy. Left ventricular diastolic parameters are consistent with Grade I diastolic dysfunction (impaired relaxation). Right Ventricle: The right ventricular size is normal. No increase in right ventricular wall thickness. Right ventricular systolic function is normal. Tricuspid regurgitation signal is inadequate for assessing PA pressure. Left Atrium: Left atrial size was mildly dilated. Right Atrium: Right atrial size was normal in size. Pericardium: There is no evidence of pericardial effusion. Presence of pericardial fat pad. Mitral Valve: The mitral valve is grossly normal. Mild mitral annular calcification. Mild mitral valve  regurgitation. Tricuspid Valve: The tricuspid valve is grossly normal. Tricuspid valve regurgitation is trivial. Aortic Valve: The aortic valve is tricuspid. There is mild aortic valve annular calcification. Aortic valve regurgitation is mild. Pulmonic Valve: The pulmonic valve was grossly normal. Pulmonic valve regurgitation is trivial. Aorta: The aortic root is normal in size and structure. Venous: The inferior vena cava is normal in size with greater than 50% respiratory variability, suggesting right atrial pressure of 3 mmHg. IAS/Shunts: No atrial level shunt detected by color flow Doppler.  LEFT VENTRICLE PLAX 2D LVIDd:         4.23 cm  Diastology LVIDs:         2.47 cm  LV e' medial:    4.57 cm/s LV PW:         1.12 cm  LV E/e' medial:  22.5 LV IVS:        1.20 cm  LV e' lateral:   7.94 cm/s LVOT diam:     2.00 cm  LV E/e' lateral: 13.0 LVOT Area:     3.14 cm  RIGHT VENTRICLE RV S prime:     10.40 cm/s TAPSE (M-mode): 1.5 cm LEFT ATRIUM           Index       RIGHT ATRIUM           Index LA diam:      3.30 cm 1.64 cm/m  RA Area:     12.80 cm LA Vol (A2C): 53.0 ml 26.36 ml/m RA Volume:   25.60 ml  12.73 ml/m LA Vol (A4C): 67.3 ml 33.47 ml/m   AORTA Ao Root diam: 3.50 cm MITRAL VALVE MV Area (PHT): 4.86 cm     SHUNTS MV Decel Time: 156 msec     Systemic Diam: 2.00 cm MV E velocity: 103.00 cm/s MV A velocity: 137.00 cm/s MV E/A ratio:  0.75 Nona Dell MD Electronically signed by Nona Dell MD Signature Date/Time: 11/18/2020/12:28:22 PM    Final    CT Renal Stone Study  Result Date: 11/17/2020 CLINICAL DATA:  Flank pain, hematuria EXAM: CT ABDOMEN AND PELVIS WITHOUT CONTRAST TECHNIQUE: Multidetector CT imaging of the abdomen and pelvis was performed following the standard protocol without IV contrast. COMPARISON:  None. FINDINGS: Lower chest: No pleural or pericardial effusion. Coronary calcifications. Sternotomy wires. Hepatobiliary: No focal liver abnormality is seen. No gallstones,  gallbladder wall thickening, or biliary dilatation. Pancreas: Unremarkable. No pancreatic ductal dilatation or surrounding inflammatory changes. Spleen: Normal in size without focal abnormality. Adrenals/Urinary Tract: Adrenal glands unremarkable. 6.1 cm probable cyst from the lower pole left kidney. 1.1 cm hyperdense lesion from the lower pole right kidney. 1 mm calculus in the upper pole left renal collecting system. No other urolithiasis. There is mild right pelvicaliectasis with edematous/inflammatory changes around the right kidney and proximal ureter. Urinary bladder is physiologically distended without calculus. Stomach/Bowel: Stomach and small bowel are nondistended. Appendix surgically absent. The colon is nondilated with scattered distal descending and sigmoid diverticula; no adjacent inflammatory change. Vascular/Lymphatic: Extensive aortoiliac calcified plaque without aneurysm. No abdominal or pelvic adenopathy. Reproductive: Mild prostate enlargement. Other: Bilateral pelvic phleboliths.  No ascites.  No free air. Musculoskeletal: Sternotomy wires. No fracture or worrisome bone lesion. IMPRESSION: 1. Left nephrolithiasis. 2. Streaky inflammatory changes around the right kidney with pelvicaliectasis suggesting possible recent stone passage, versus UTI. 3. Descending and sigmoid diverticulosis. 4. Coronary and Aortic Atherosclerosis (ICD10-170.0). 5. Nonspecific 1.1 cm hyperdense right lower pole renal lesion. In the absence of prior studies demonstrating stability, consider 1 year follow-up with renal protocol MR or CT with contrast. Electronically Signed   By: Corlis Leak M.D.   On: 11/17/2020 14:26    Microbiology: Recent Results (from the past 240 hour(s))  Urine Culture     Status: Abnormal (Preliminary result)   Collection Time: 11/17/20  1:19 PM   Specimen: Urine, Clean Catch  Result Value Ref Range Status   Specimen Description   Final    URINE, CLEAN CATCH Performed at [facility], [address] [place], [place] [address]    Special Requests   Final  NONE Performed at [facility], [address], [place], [place] [address]    Culture (A)  Final    80,000 COLONIES/mL STAPHYLOCOCCUS AUREUS SUSCEPTIBILITIES TO FOLLOW Performed at [facility], [address], [place], [place] [address]    Report Status PENDING  Incomplete  SARS CORONAVIRUS 2 (TAT 6-24 HRS) Nasopharyngeal Nasopharyngeal Swab     Status: None   Collection Time: 11/17/20  4:01 PM   Specimen: Nasopharyngeal Swab  Result Value Ref Range Status   SARS Coronavirus 2 NEGATIVE NEGATIVE Final    Comment: (NOTE) SARS-CoV-2 target nucleic acids are NOT DETECTED.  The SARS-CoV-2 RNA is generally detectable in upper and lower respiratory specimens during the acute phase of infection. Negative results do not preclude SARS-CoV-2 infection, do not rule out co-infections with other pathogens, and should not be used as the sole basis for treatment or other patient management decisions. Negative results must be combined with clinical observations, patient history, and epidemiological information. The expected result is Negative.  Fact Sheet for Patients: HairSlick.no  Fact Sheet for Healthcare Providers: quierodirigir.com  This test is not yet approved or cleared by the [place] FDA and  has been authorized for detection and/or diagnosis of SARS-CoV-2 by FDA under an Emergency Use Authorization (EUA). This EUA will remain  in effect (meaning this test can be used) for the duration of the COVID-19 declaration under Se ction 564(b)(1) of the Act, 21 U.S.C. section 360bbb-3(b)(1), unless the authorization is terminated or revoked sooner.  Performed at [facility], [address] [place], [place] [address]      Labs: Basic Metabolic Panel: Recent Labs  Lab 11/17/20 1136 11/18/20 0643  NA 133* 138  K 4.4 3.9  CL 98 103   CO2 23 25  GLUCOSE 346* 260*  BUN 20 15  CREATININE 1.11 0.91  CALCIUM 9.3 9.0   Liver Function Tests: Recent Labs  Lab 11/17/20 1136 11/18/20 0643  AST 14* 13*  ALT 20 18  ALKPHOS 112 94  BILITOT 0.9 0.5  PROT 7.5 6.6  ALBUMIN 3.1* 2.6*   CBC: Recent Labs  Lab 11/17/20 1136 11/18/20 0643  WBC 18.1* 16.6*  NEUTROABS 15.8*  --   HGB 13.4 12.4*  HCT 39.6 37.3*  MCV 95.4 95.6  PLT 327 314   CBG: Recent Labs  Lab 11/17/20 2210 11/18/20 0735 11/18/20 1105 11/18/20 1622  GLUCAP 278* 203* 220* 142*   Signed:  Vassie Loll MD.  [facility] 11/18/2020, 5:02 PM

## 2020-11-18 NOTE — Progress Notes (Signed)
Nsg Discharge Note  Admit Date:  11/17/2020 Discharge date: 11/18/2020   Jason Cox to be D/C'd  Home per MD order.  AVS completed.  Copy for chart, and copy for patient signed, and dated. Patient/caregiver able to verbalize understanding.  Discharge Medication: Allergies as of 11/18/2020   Not on File     Medication List    TAKE these medications   amLODipine 10 MG tablet Commonly known as: NORVASC   aspirin EC 81 MG tablet Take 1 tablet (81 mg total) by mouth daily. Swallow whole.   atorvastatin 40 MG tablet Commonly known as: Lipitor Take 1 tablet (40 mg total) by mouth daily.   cephALEXin 500 MG capsule Commonly known as: KEFLEX Take 1 capsule (500 mg total) by mouth 4 (four) times daily for 10 days.   clopidogrel 75 MG tablet Commonly known as: Plavix Take 1 tablet (75 mg total) by mouth daily.   diltiazem 360 MG 24 hr capsule Commonly known as: Cardizem CD Take 1 capsule (360 mg total) by mouth daily. What changed:   medication strength  how much to take  how to take this  when to take this  Another medication with the same name was removed. Continue taking this medication, and follow the directions you see here.   indomethacin 25 MG capsule Commonly known as: INDOCIN Take 25 mg by mouth 4 (four) times daily as needed.   levothyroxine 75 MCG tablet Commonly known as: SYNTHROID   losartan 100 MG tablet Commonly known as: COZAAR   metFORMIN 500 MG 24 hr tablet Commonly known as: GLUCOPHAGE-XR   metoprolol succinate 50 MG 24 hr tablet Commonly known as: TOPROL-XL   pantoprazole 40 MG tablet Commonly known as: PROTONIX Take 1 tablet (40 mg total) by mouth daily. Start taking on: Nov 19, 2020       Discharge Assessment: Vitals:   11/18/20 0829 11/18/20 1301  BP: (!) 166/81 125/62  Pulse: 86 81  Resp:  18  Temp:  98.1 F (36.7 C)  SpO2:  97%   Skin clean, dry and intact without evidence of skin break down, no evidence of skin tears  noted. IV catheter discontinued intact. Site without signs and symptoms of complications - no redness or edema noted at insertion site, patient denies c/o pain - only slight tenderness at site.  Dressing with slight pressure applied.  D/c Instructions-Education: Discharge instructions given to patient/family with verbalized understanding. D/c education completed with patient/family including follow up instructions, medication list, d/c activities limitations if indicated, with other d/c instructions as indicated by MD - patient able to verbalize understanding, all questions fully answered. Patient instructed to return to ED, call 911, or call MD for any changes in condition.  Patient escorted via WC, and D/C home via private auto.  Demetrio Lapping, LPN 10/08/2438 1:02 PM

## 2020-11-18 NOTE — Evaluation (Signed)
Occupational Therapy Evaluation Patient Details Name: Jason Cox MRN: [id number] DOB: 1945/11/06 Today's Date: 11/18/2020    History of Present Illness Jason Cox  is a 75 y.o. male, with history of hypertension, coronary artery disease, diabetes mellitus type 2, thyroid disease, presents the ED with a chief complaint of dizziness.  Patient reports that he has had intermittent dizziness for the past couple of weeks.  He reports that there are no provoking or relieving factors, and it is entirely unpredictable.  Patient reports that at first he did not think anything of it because he has a history of vertigo.  For the past couple of weeks this dizziness does not seem to different than his vertigo.  Today it was much worse.  He reports it as feeling off balance, neither the room nor himself feel like they are spinning.  He was in the kitchen when he had multiple falls today, 1 against the sink, 1 against the stove, and more that he cannot remember to describe.  He reports that he did not hit his head and did not lose consciousness.  He reports that he was afraid to walk in the middle of the room because he knew he would need something to grab onto if he started to fall.  He is not sure if he was falling to one side more than the other or not.  He denies any blurry vision, change in hearing, chest pain, palpitations, weakness or numbness in any extremity, trouble speaking, trouble swallowing, drooling, facial droop.  He does admit to numbness around his mouth that was present today and he is not sure when it went away.  Patient ambulates with a cane, but did not feel safe getting around the home with these falls even with a cane.  This is the reason that patient called EMS to be transported to the ER today.  He also complains of dysuria.  Dysuria started about a week ago, and then resolved a few days later when he started drinking V8.  He has continued to have frequency, but the dysuria is gone.  He has  not noticed hematuria.  He reports he has had UTIs in the past, and that this felt like a UTI which is why he started drinking the V8.  He was not prescribed an antibiotic in the outpatient setting for the symptoms.  On review of systems patient also complains of decreased appetite over the last week.  He reports that he has not been eating, and he knows that it is wrong in the setting of diabetes mellitus type 2.  He reports that he feels dehydrated secondary to poor p.o. intake.   Clinical Impression   Pt agreeable to OT/PT co-evaluation this date. Pt demonstrates decreased sensation to light touch on R UE. Pt demonstrates general UE weakness bilaterally. Pt reports this is due to history of arthritis. Pt noted to have painful bruise on L 3rd MCP joint but did not appear to have any ROM limitations. Pt able to complete ambulation and functional transfers with Mod I level of assist. Pt recommended for continued OT in the hospital and recommended venue below to increase strength and balance for safe ADL's.    Follow Up Recommendations  Home health OT    Equipment Recommendations  None recommended by OT           Precautions / Restrictions Precautions Precautions: Fall Restrictions Weight Bearing Restrictions: No      Mobility Bed Mobility Overal bed mobility: Modified Independent  General bed mobility comments: supine to EOB    Transfers Overall transfer level: Needs assistance   Transfers: Sit to/from Stand;Stand Pivot Transfers Sit to Stand: Modified independent (Device/Increase time) Stand pivot transfers: Modified independent (Device/Increase time) (As part of ambulatory transfer walking in hall prior to sitting in chair.)       General transfer comment: mild labored movement.    Balance Overall balance assessment: Mild deficits observed, not formally tested                                         ADL either performed or assessed with  clinical judgement   ADL Overall ADL's : Modified independent;Needs assistance/impaired                     Lower Body Dressing: Modified independent Lower Body Dressing Details (indicate cue type and reason): Able to don shoes at EOB. Toilet Transfer: Modified Diplomatic Services operational officer Details (indicate cue type and reason): simulated via ambulatory transfer from EOB to chair.                 Vision Baseline Vision/History: No visual deficits Patient Visual Report: No change from baseline Vision Assessment?: No apparent visual deficits                Pertinent Vitals/Pain Pain Assessment: Faces Faces Pain Scale: Hurts little more Pain Location: 3rd MCP or L UE. Noted bruise. Pain Descriptors / Indicators: Grimacing;Guarding Pain Intervention(s): Limited activity within patient's tolerance;Monitored during session     Hand Dominance Right   Extremity/Trunk Assessment Upper Extremity Assessment Upper Extremity Assessment: Generalized weakness;RUE deficits/detail;LUE deficits/detail RUE Deficits / Details: Gernal UE weakness. 4/5 shoulder flexion and abduction, elbow flexion 4+/5, elbow extension 4-/5. 4/5 grip strength. Reported decreased sensatio nto light touch. Reported tindling in R MCP area. RUE Sensation: decreased light touch RUE Coordination: WNL LUE Deficits / Details: Gernal UE weakness 4/5 MMT shoulder, 4+/5 elbow flexion; 4-/5 elbow extension. 4-/5 grip. NOted bruise on 3rd digit MCP. LUE Sensation: WNL LUE Coordination: WNL   Lower Extremity Assessment Lower Extremity Assessment: Defer to PT evaluation   Cervical / Trunk Assessment Cervical / Trunk Assessment: Normal   Communication Communication Communication: No difficulties   Cognition Arousal/Alertness: Awake/alert Behavior During Therapy: WFL for tasks assessed/performed Overall Cognitive Status: Within Functional Limits for tasks assessed                                                       Home Living Family/patient expects to be discharged to:: Private residence Living Arrangements: Other relatives (brother) Available Help at Discharge: Family (brother available intermittently but pt reports that he takes typically takes care of his brother.) Type of Home: House Home Access: Stairs to enter Secretary/administrator of Steps: 4 Entrance Stairs-Rails: Left Home Layout: One level     Bathroom Shower/Tub: Producer, television/film/video: Standard     Home Equipment: Cane - single point;Grab bars - tub/shower;Shower seat - built in          Prior Functioning/Environment Level of Independence: Independent with assistive device(s)        Comments: household and community ambulator with use of cane PRN. Independent for ADL's and  IADL's per report.        OT Problem List: Decreased strength;Decreased range of motion;Impaired balance (sitting and/or standing);Impaired sensation      OT Treatment/Interventions: Self-care/ADL training;Therapeutic exercise;Therapeutic activities;Balance training;Patient/family education    OT Goals(Current goals can be found in the care plan section) Acute Rehab OT Goals Patient Stated Goal: return home OT Goal Formulation: With patient Time For Goal Achievement: 12/02/20 Potential to Achieve Goals: Good  OT Frequency: Min 1X/week               Co-evaluation PT/OT/SLP Co-Evaluation/Treatment: Yes Reason for Co-Treatment: To address functional/ADL transfers   OT goals addressed during session: ADL's and self-care;Strengthening/ROM      AM-PAC OT "6 Clicks" Daily Activity     Outcome Measure Help from another person eating meals?: None Help from another person taking care of personal grooming?: None Help from another person toileting, which includes using toliet, bedpan, or urinal?: None Help from another person bathing (including washing, rinsing, drying)?: None Help  from another person to put on and taking off regular upper body clothing?: None Help from another person to put on and taking off regular lower body clothing?: None 6 Click Score: 24   End of Session    Activity Tolerance: Patient tolerated treatment well Patient left: in chair;with call bell/phone within reach  OT Visit Diagnosis: Muscle weakness (generalized) (M62.81);Repeated falls (R29.6);History of falling (Z91.81);Other symptoms and signs involving the nervous system (R29.898);Dizziness and giddiness (R42)                Time: 6283-1517 OT Time Calculation (min): 27 min Charges:  OT General Charges $OT Visit: 1 Visit OT Evaluation $OT Eval Low Complexity: 1 Low  Tevis Dunavan OT, MOT   Danie Chandler 11/18/2020, 10:11 AM

## 2020-11-18 NOTE — Progress Notes (Signed)
Inpatient Diabetes Program Recommendations  AACE/ADA: New Consensus Statement on Inpatient Glycemic Control (2015)  Target Ranges:  Prepandial:   less than 140 mg/dL      Peak postprandial:   less than 180 mg/dL (1-2 hours)      Critically ill patients:  140 - 180 mg/dL   Lab Results  Component Value Date   GLUCAP 203 (H) 11/18/2020   HGBA1C  05/21/2007    5.7 (NOTE)   The ADA recommends the following therapeutic goals for glycemic   control related to Hgb A1C measurement:   Goal of Therapy:   < 7.0% Hgb A1C   Action Suggested:  > 8.0% Hgb A1C   Ref:  Diabetes Care, 22, Suppl. 1, 1999    Review of Glycemic Control  Diabetes history: DM2 Outpatient Diabetes medications: metformin 500 QD Current orders for Inpatient glycemic control: Novolog 0-15 units TID with meals and 0-5 HS  260, 203 mg/dL. May benefit from addition of basal insulin. Need updated HgbA1C to assess glycemic control PTA.   Inpatient Diabetes Program Recommendations:     Add Lantus 10 units QHS May need small amount of meal coverage insulin when po intake improves. Need updated HgbA1C   Continue to follow.  Thank you. Ailene Ards, RD, LDN, CDE Inpatient Diabetes Coordinator [phone]

## 2020-11-18 NOTE — Care Management Obs Status (Signed)
MEDICARE OBSERVATION STATUS NOTIFICATION   Patient Details  Name: Jason Cox MRN: [id number] Date of Birth: [birth year]   Medicare Observation Status Notification Given:  Yes    Corey Harold 11/18/2020, 4:10 PM

## 2020-11-18 NOTE — Progress Notes (Signed)
*  PRELIMINARY RESULTS* Echocardiogram 2D Echocardiogram has been performed.  Jeryl Columbia 11/18/2020, 9:58 AM

## 2020-11-18 NOTE — Plan of Care (Signed)
  Problem: Acute Rehab OT Goals (only OT should resolve) Goal: Pt/Caregiver Will Perform Home Exercise Program Flowsheets (Taken 11/18/2020 1015) Pt/caregiver will Perform Home Exercise Program:  Increased strength  Both right and left upper extremity  With Supervision  Independently  Anallely Rosell OT, MOT

## 2020-11-18 NOTE — Evaluation (Addendum)
Physical Therapy Evaluation Patient Details Name: Miliano Cotten MRN: [id number] DOB: 07/23/1945 Today's Date: 11/18/2020   History of Present Illness  Jason Cox  is a 75 y.o. male, with history of hypertension, coronary artery disease, diabetes mellitus type 2, thyroid disease, presents the ED with a chief complaint of dizziness.  Patient reports that he has had intermittent dizziness for the past couple of weeks.  He reports that there are no provoking or relieving factors, and it is entirely unpredictable.  Patient reports that at first he did not think anything of it because he has a history of vertigo.  For the past couple of weeks this dizziness does not seem to different than his vertigo.  Today it was much worse.  He reports it as feeling off balance, neither the room nor himself feel like they are spinning.  He was in the kitchen when he had multiple falls today, 1 against the sink, 1 against the stove, and more that he cannot remember to describe.  He reports that he did not hit his head and did not lose consciousness.  He reports that he was afraid to walk in the middle of the room because he knew he would need something to grab onto if he started to fall.  He is not sure if he was falling to one side more than the other or not.  He denies any blurry vision, change in hearing, chest pain, palpitations, weakness or numbness in any extremity, trouble speaking, trouble swallowing, drooling, facial droop.  He does admit to numbness around his mouth that was present today and he is not sure when it went away.  Patient ambulates with a cane, but did not feel safe getting around the home with these falls even with a cane.  This is the reason that patient called EMS to be transported to the ER today.  He also complains of dysuria.  Dysuria started about a week ago, and then resolved a few days later when he started drinking V8.  He has continued to have frequency, but the dysuria is gone.  He has not  noticed hematuria.  He reports he has had UTIs in the past, and that this felt like a UTI which is why he started drinking the V8.  He was not prescribed an antibiotic in the outpatient setting for the symptoms.  On review of systems patient also complains of decreased appetite over the last week.  He reports that he has not been eating, and he knows that it is wrong in the setting of diabetes mellitus type 2.  He reports that he feels dehydrated secondary to poor p.o. intake.    Clinical Impression  Patient functioning near baseline for functional mobility and gait other than mild weakness in right hip flexors, numerous bruises from falls at home and c/o severe pain with pressure to dorsum of left hand.  Patient demonstrates good return for ambulation in room, hallway and on stairs without loss of balance, mild stumbling when going down ramp and had to slow cadence when going up ramp.  Plan:  Patient discharged from physical therapy to care of nursing for ambulation daily as tolerated for length of stay.     Follow Up Recommendations Home health PT;Supervision - Intermittent    Equipment Recommendations  None recommended by PT    Recommendations for Other Services       Precautions / Restrictions Precautions Precautions: Fall Restrictions Weight Bearing Restrictions: No      Mobility  Bed Mobility Overal bed mobility: Modified Independent             General bed mobility comments: supine to EOB    Transfers Overall transfer level: Needs assistance Equipment used: None Transfers: Sit to/from [employer] Transfers Sit to Stand: Modified independent (Device/Increase time) Stand pivot transfers: Modified independent (Device/Increase time)       General transfer comment: slightly labored movement without loss of balance  Ambulation/Gait Ambulation/Gait assistance: Modified independent (Device/Increase time) Gait Distance (Feet): 200 Feet Assistive device: None Gait  Pattern/deviations: Decreased step length - right;Decreased stride length;WFL(Within Functional Limits) Gait velocity: decreased   General Gait Details: slightly labored movement with RLE with mild stumbling without loss of balance when going down ramp, had to slow cadence with going up ramp and had to use siderail when going up/down steps  Stairs Stairs: Yes Stairs assistance: Modified independent (Device/Increase time);Supervision Stair Management: One rail Right;One rail Left;Step to pattern Number of Stairs: 4 General stair comments: demonstrates good return for going up/down steps in stair using 1 siderail without loss of balance  Wheelchair Mobility    Modified Rankin (Stroke Patients Only)       Balance Overall balance assessment: Mild deficits observed, not formally tested                                           Pertinent Vitals/Pain Pain Assessment: Faces Faces Pain Scale: Hurts little more Pain Location: 3rd MCP or L UE. Noted bruise. Pain Descriptors / Indicators: Grimacing;Guarding Pain Intervention(s): Limited activity within patient's tolerance;Monitored during session    Home Living Family/patient expects to be discharged to:: Private residence Living Arrangements: Other relatives Available Help at Discharge: Family Type of Home: House Home Access: Stairs to enter Entrance Stairs-Rails: Left Entrance Stairs-Number of Steps: 4 Home Layout: One level Home Equipment: Cane - single point;Grab bars - tub/shower;Shower seat - built in      Prior Function Level of Independence: Independent with assistive device(s)         Comments: household and community ambulator with use of cane PRN. Independent for ADL's and IADL's per report.     Hand Dominance   Dominant Hand: Right    Extremity/Trunk Assessment   Upper Extremity Assessment Upper Extremity Assessment: Defer to OT evaluation RUE Deficits / Details: Gernal UE weakness. 4/5  shoulder flexion and abduction, elbow flexion 4+/5, elbow extension 4-/5. 4/5 grip strength. Reported decreased sensatio nto light touch. Reported tindling in R MCP area. RUE Sensation: decreased light touch RUE Coordination: WNL LUE Deficits / Details: Gernal UE weakness 4/5 MMT shoulder, 4+/5 elbow flexion; 4-/5 elbow extension. 4-/5 grip. NOted bruise on 3rd digit MCP. LUE Sensation: WNL LUE Coordination: WNL    Lower Extremity Assessment Lower Extremity Assessment: RLE deficits/detail RLE Deficits / Details: grossly 4+/5 except right hip flexors grossly 3+/5 RLE Sensation: decreased light touch RLE Coordination: WNL    Cervical / Trunk Assessment Cervical / Trunk Assessment: Normal  Communication   Communication: No difficulties  Cognition Arousal/Alertness: Awake/alert Behavior During Therapy: WFL for tasks assessed/performed Overall Cognitive Status: Within Functional Limits for tasks assessed                                        General Comments      Exercises  Assessment/Plan    PT Assessment All further PT needs can be met in the next venue of care  PT Problem List Decreased strength;Decreased activity tolerance;Decreased mobility;Decreased balance       PT Treatment Interventions      PT Goals (Current goals can be found in the Care Plan section)  Acute Rehab PT Goals Patient Stated Goal: return home PT Goal Formulation: With patient Time For Goal Achievement: 11/18/20 Potential to Achieve Goals: Good    Frequency     Barriers to discharge        Co-evaluation PT/OT/SLP Co-Evaluation/Treatment: Yes Reason for Co-Treatment: To address functional/ADL transfers PT goals addressed during session: Mobility/safety with mobility;Balance OT goals addressed during session: ADL's and self-care;Strengthening/ROM       AM-PAC PT "6 Clicks" Mobility  Outcome Measure Help needed turning from your back to your side while in a flat bed  without using bedrails?: None Help needed moving from lying on your back to sitting on the side of a flat bed without using bedrails?: None Help needed moving to and from a bed to a chair (including a wheelchair)?: None Help needed standing up from a chair using your arms (e.g., wheelchair or bedside chair)?: None Help needed to walk in hospital room?: A Little Help needed climbing 3-5 steps with a railing? : A Little 6 Click Score: 22    End of Session   Activity Tolerance: Patient tolerated treatment well Patient left: in chair;with call bell/phone within reach Nurse Communication: Mobility status PT Visit Diagnosis: Unsteadiness on feet (R26.81);Other abnormalities of gait and mobility (R26.89);Muscle weakness (generalized) (M62.81)    Time: 9842-1031 PT Time Calculation (min) (ACUTE ONLY): 24 min   Charges:   PT Evaluation $PT Eval Moderate Complexity: 1 Mod PT Treatments $Therapeutic Activity: 23-37 mins        11:56 AM, 11/18/20 Lonell Grandchild, MPT Physical Therapist with [facility] [phone] office [phone] mobile phone

## 2020-11-19 LAB — HEMOGLOBIN A1C
Hgb A1c MFr Bld: 8.6 % — ABNORMAL HIGH (ref 4.8–5.6)
Mean Plasma Glucose: 200 mg/dL

## 2020-11-20 LAB — URINE CULTURE: Culture: 80000 — AB

## 2020-12-01 DIAGNOSIS — Z6825 Body mass index (BMI) 25.0-25.9, adult: Secondary | ICD-10-CM | POA: Diagnosis not present

## 2020-12-01 DIAGNOSIS — E1142 Type 2 diabetes mellitus with diabetic polyneuropathy: Secondary | ICD-10-CM | POA: Diagnosis not present

## 2020-12-01 DIAGNOSIS — E663 Overweight: Secondary | ICD-10-CM | POA: Diagnosis not present

## 2020-12-01 DIAGNOSIS — I10 Essential (primary) hypertension: Secondary | ICD-10-CM | POA: Diagnosis not present

## 2020-12-01 DIAGNOSIS — I6930 Unspecified sequelae of cerebral infarction: Secondary | ICD-10-CM | POA: Diagnosis not present

## 2020-12-01 DIAGNOSIS — E1140 Type 2 diabetes mellitus with diabetic neuropathy, unspecified: Secondary | ICD-10-CM | POA: Diagnosis not present

## 2021-01-18 ENCOUNTER — Inpatient Hospital Stay (HOSPITAL_COMMUNITY)
Admission: EM | Admit: 2021-01-18 | Discharge: 2021-01-22 | DRG: 065 | Disposition: A | Discharge status: Skilled Nursing Facility | Payer: Medicare Other | Attending: Internal Medicine | Admitting: Internal Medicine

## 2021-01-18 ENCOUNTER — Emergency Department (HOSPITAL_COMMUNITY): Payer: Medicare Other

## 2021-01-18 ENCOUNTER — Other Ambulatory Visit: Payer: Self-pay

## 2021-01-18 DIAGNOSIS — Z7902 Long term (current) use of antithrombotics/antiplatelets: Secondary | ICD-10-CM | POA: Diagnosis not present

## 2021-01-18 DIAGNOSIS — E1165 Type 2 diabetes mellitus with hyperglycemia: Secondary | ICD-10-CM | POA: Diagnosis not present

## 2021-01-18 DIAGNOSIS — N3000 Acute cystitis without hematuria: Secondary | ICD-10-CM

## 2021-01-18 DIAGNOSIS — I10 Essential (primary) hypertension: Secondary | ICD-10-CM | POA: Diagnosis not present

## 2021-01-18 DIAGNOSIS — R1312 Dysphagia, oropharyngeal phase: Secondary | ICD-10-CM | POA: Diagnosis not present

## 2021-01-18 DIAGNOSIS — E669 Obesity, unspecified: Secondary | ICD-10-CM | POA: Diagnosis not present

## 2021-01-18 DIAGNOSIS — Z79890 Hormone replacement therapy (postmenopausal): Secondary | ICD-10-CM | POA: Diagnosis not present

## 2021-01-18 DIAGNOSIS — B958 Unspecified staphylococcus as the cause of diseases classified elsewhere: Secondary | ICD-10-CM | POA: Diagnosis present

## 2021-01-18 DIAGNOSIS — G463 Brain stem stroke syndrome: Secondary | ICD-10-CM | POA: Diagnosis not present

## 2021-01-18 DIAGNOSIS — Z7984 Long term (current) use of oral hypoglycemic drugs: Secondary | ICD-10-CM | POA: Diagnosis not present

## 2021-01-18 DIAGNOSIS — Z7982 Long term (current) use of aspirin: Secondary | ICD-10-CM | POA: Diagnosis not present

## 2021-01-18 DIAGNOSIS — G8191 Hemiplegia, unspecified affecting right dominant side: Secondary | ICD-10-CM | POA: Diagnosis present

## 2021-01-18 DIAGNOSIS — R41841 Cognitive communication deficit: Secondary | ICD-10-CM | POA: Diagnosis not present

## 2021-01-18 DIAGNOSIS — E1159 Type 2 diabetes mellitus with other circulatory complications: Secondary | ICD-10-CM | POA: Diagnosis not present

## 2021-01-18 DIAGNOSIS — I639 Cerebral infarction, unspecified: Secondary | ICD-10-CM | POA: Diagnosis present

## 2021-01-18 DIAGNOSIS — I4510 Unspecified right bundle-branch block: Secondary | ICD-10-CM | POA: Diagnosis present

## 2021-01-18 DIAGNOSIS — I6623 Occlusion and stenosis of bilateral posterior cerebral arteries: Secondary | ICD-10-CM | POA: Diagnosis not present

## 2021-01-18 DIAGNOSIS — I63533 Cerebral infarction due to unspecified occlusion or stenosis of bilateral posterior cerebral arteries: Principal | ICD-10-CM | POA: Diagnosis present

## 2021-01-18 DIAGNOSIS — G8929 Other chronic pain: Secondary | ICD-10-CM | POA: Diagnosis present

## 2021-01-18 DIAGNOSIS — M069 Rheumatoid arthritis, unspecified: Secondary | ICD-10-CM | POA: Diagnosis not present

## 2021-01-18 DIAGNOSIS — E785 Hyperlipidemia, unspecified: Secondary | ICD-10-CM | POA: Diagnosis present

## 2021-01-18 DIAGNOSIS — I69952 Hemiplegia and hemiparesis following unspecified cerebrovascular disease affecting left dominant side: Secondary | ICD-10-CM | POA: Diagnosis not present

## 2021-01-18 DIAGNOSIS — R2681 Unsteadiness on feet: Secondary | ICD-10-CM | POA: Diagnosis not present

## 2021-01-18 DIAGNOSIS — G319 Degenerative disease of nervous system, unspecified: Secondary | ICD-10-CM | POA: Diagnosis not present

## 2021-01-18 DIAGNOSIS — R471 Dysarthria and anarthria: Secondary | ICD-10-CM | POA: Diagnosis not present

## 2021-01-18 DIAGNOSIS — E1149 Type 2 diabetes mellitus with other diabetic neurological complication: Secondary | ICD-10-CM

## 2021-01-18 DIAGNOSIS — E039 Hypothyroidism, unspecified: Secondary | ICD-10-CM | POA: Diagnosis present

## 2021-01-18 DIAGNOSIS — Z87891 Personal history of nicotine dependence: Secondary | ICD-10-CM | POA: Diagnosis not present

## 2021-01-18 DIAGNOSIS — Z79899 Other long term (current) drug therapy: Secondary | ICD-10-CM

## 2021-01-18 DIAGNOSIS — I63541 Cerebral infarction due to unspecified occlusion or stenosis of right cerebellar artery: Secondary | ICD-10-CM | POA: Diagnosis not present

## 2021-01-18 DIAGNOSIS — Z20822 Contact with and (suspected) exposure to covid-19: Secondary | ICD-10-CM | POA: Diagnosis not present

## 2021-01-18 DIAGNOSIS — R0902 Hypoxemia: Secondary | ICD-10-CM | POA: Diagnosis not present

## 2021-01-18 DIAGNOSIS — N390 Urinary tract infection, site not specified: Secondary | ICD-10-CM | POA: Diagnosis not present

## 2021-01-18 DIAGNOSIS — M159 Polyosteoarthritis, unspecified: Secondary | ICD-10-CM | POA: Diagnosis not present

## 2021-01-18 DIAGNOSIS — Z9181 History of falling: Secondary | ICD-10-CM | POA: Diagnosis not present

## 2021-01-18 DIAGNOSIS — E119 Type 2 diabetes mellitus without complications: Secondary | ICD-10-CM | POA: Diagnosis not present

## 2021-01-18 DIAGNOSIS — I440 Atrioventricular block, first degree: Secondary | ICD-10-CM | POA: Diagnosis present

## 2021-01-18 DIAGNOSIS — I6782 Cerebral ischemia: Secondary | ICD-10-CM | POA: Diagnosis not present

## 2021-01-18 DIAGNOSIS — R262 Difficulty in walking, not elsewhere classified: Secondary | ICD-10-CM | POA: Diagnosis not present

## 2021-01-18 DIAGNOSIS — I739 Peripheral vascular disease, unspecified: Secondary | ICD-10-CM | POA: Diagnosis not present

## 2021-01-18 DIAGNOSIS — E1151 Type 2 diabetes mellitus with diabetic peripheral angiopathy without gangrene: Secondary | ICD-10-CM | POA: Diagnosis present

## 2021-01-18 DIAGNOSIS — R531 Weakness: Secondary | ICD-10-CM | POA: Diagnosis not present

## 2021-01-18 DIAGNOSIS — R9082 White matter disease, unspecified: Secondary | ICD-10-CM | POA: Diagnosis not present

## 2021-01-18 DIAGNOSIS — I6613 Occlusion and stenosis of bilateral anterior cerebral arteries: Secondary | ICD-10-CM | POA: Diagnosis not present

## 2021-01-18 DIAGNOSIS — R200 Anesthesia of skin: Secondary | ICD-10-CM | POA: Diagnosis not present

## 2021-01-18 DIAGNOSIS — R000 Tachycardia, unspecified: Secondary | ICD-10-CM | POA: Diagnosis not present

## 2021-01-18 DIAGNOSIS — I69351 Hemiplegia and hemiparesis following cerebral infarction affecting right dominant side: Secondary | ICD-10-CM | POA: Diagnosis not present

## 2021-01-18 DIAGNOSIS — I6501 Occlusion and stenosis of right vertebral artery: Secondary | ICD-10-CM | POA: Diagnosis not present

## 2021-01-18 DIAGNOSIS — M6281 Muscle weakness (generalized): Secondary | ICD-10-CM | POA: Diagnosis not present

## 2021-01-18 DIAGNOSIS — R279 Unspecified lack of coordination: Secondary | ICD-10-CM | POA: Diagnosis not present

## 2021-01-18 DIAGNOSIS — I6389 Other cerebral infarction: Secondary | ICD-10-CM | POA: Diagnosis not present

## 2021-01-18 LAB — GLUCOSE, CAPILLARY: Glucose-Capillary: 233 mg/dL — ABNORMAL HIGH (ref 70–99)

## 2021-01-18 LAB — DIFFERENTIAL
Abs Immature Granulocytes: 0.09 10*3/uL — ABNORMAL HIGH (ref 0.00–0.07)
Basophils Absolute: 0 10*3/uL (ref 0.0–0.1)
Basophils Relative: 0 %
Eosinophils Absolute: 0 10*3/uL (ref 0.0–0.5)
Eosinophils Relative: 0 %
Immature Granulocytes: 1 %
Lymphocytes Relative: 8 %
Lymphs Abs: 1.1 10*3/uL (ref 0.7–4.0)
Monocytes Absolute: 0.8 10*3/uL (ref 0.1–1.0)
Monocytes Relative: 6 %
Neutro Abs: 11.5 10*3/uL — ABNORMAL HIGH (ref 1.7–7.7)
Neutrophils Relative %: 85 %

## 2021-01-18 LAB — COMPREHENSIVE METABOLIC PANEL
ALT: 16 U/L (ref 0–44)
AST: 12 U/L — ABNORMAL LOW (ref 15–41)
Albumin: 3.6 g/dL (ref 3.5–5.0)
Alkaline Phosphatase: 161 U/L — ABNORMAL HIGH (ref 38–126)
Anion gap: 8 (ref 5–15)
BUN: 22 mg/dL (ref 8–23)
CO2: 23 mmol/L (ref 22–32)
Calcium: 9.6 mg/dL (ref 8.9–10.3)
Chloride: 106 mmol/L (ref 98–111)
Creatinine, Ser: 1.19 mg/dL (ref 0.61–1.24)
GFR, Estimated: >60 mL/min (ref >60)
Glucose, Bld: 260 mg/dL — ABNORMAL HIGH (ref 70–99)
Potassium: 4.9 mmol/L (ref 3.5–5.1)
Sodium: 137 mmol/L (ref 135–145)
Total Bilirubin: 0.5 mg/dL (ref 0.3–1.2)
Total Protein: 8.2 g/dL — ABNORMAL HIGH (ref 6.5–8.1)

## 2021-01-18 LAB — URINALYSIS, ROUTINE W REFLEX MICROSCOPIC
Bilirubin Urine: NEGATIVE
Glucose, UA: >=500 mg/dL — AB
Ketones, ur: 20 mg/dL — AB
Nitrite: NEGATIVE
Protein, ur: 100 mg/dL — AB
RBC / HPF: >50 RBC/hpf — ABNORMAL HIGH (ref 0–5)
Specific Gravity, Urine: 1.013 (ref 1.005–1.030)
WBC, UA: >50 WBC/hpf — ABNORMAL HIGH (ref 0–5)
pH: 7 (ref 5.0–8.0)

## 2021-01-18 LAB — RESP PANEL BY RT-PCR (FLU A&B, COVID) ARPGX2
Influenza A by PCR: NEGATIVE
Influenza B by PCR: NEGATIVE
SARS Coronavirus 2 by RT PCR: NEGATIVE

## 2021-01-18 LAB — RAPID URINE DRUG SCREEN, HOSP PERFORMED
Amphetamines: NOT DETECTED
Barbiturates: NOT DETECTED
Benzodiazepines: NOT DETECTED
Cocaine: NOT DETECTED
Opiates: NOT DETECTED
Tetrahydrocannabinol: NOT DETECTED

## 2021-01-18 LAB — I-STAT CHEM 8, ED
BUN: 23 mg/dL (ref 8–23)
Calcium, Ion: 1.29 mmol/L (ref 1.15–1.40)
Chloride: 107 mmol/L (ref 98–111)
Creatinine, Ser: 1.1 mg/dL (ref 0.61–1.24)
Glucose, Bld: 258 mg/dL — ABNORMAL HIGH (ref 70–99)
HCT: 40 % (ref 39.0–52.0)
Hemoglobin: 13.6 g/dL (ref 13.0–17.0)
Potassium: 5.2 mmol/L — ABNORMAL HIGH (ref 3.5–5.1)
Sodium: 140 mmol/L (ref 135–145)
TCO2: 26 mmol/L (ref 22–32)

## 2021-01-18 LAB — CBC
HCT: 40.5 % (ref 39.0–52.0)
Hemoglobin: 13.2 g/dL (ref 13.0–17.0)
MCH: 31.4 pg (ref 26.0–34.0)
MCHC: 32.6 g/dL (ref 30.0–36.0)
MCV: 96.4 fL (ref 80.0–100.0)
Platelets: 377 10*3/uL (ref 150–400)
RBC: 4.2 MIL/uL — ABNORMAL LOW (ref 4.22–5.81)
RDW: 13.9 % (ref 11.5–15.5)
WBC: 13.5 10*3/uL — ABNORMAL HIGH (ref 4.0–10.5)
nRBC: 0 % (ref 0.0–0.2)

## 2021-01-18 LAB — APTT: aPTT: 25 seconds (ref 24–36)

## 2021-01-18 LAB — PROTIME-INR
INR: 1 (ref 0.8–1.2)
Prothrombin Time: 12.7 seconds (ref 11.4–15.2)

## 2021-01-18 LAB — ETHANOL: Alcohol, Ethyl (B): <10 mg/dL (ref <10)

## 2021-01-18 IMAGING — CT CT HEAD W/O CM
3 series · 15 of 47 positions shown, 18 images · non-contrast
Comparison: [DATE].

CLINICAL DATA: Right-sided weakness.

EXAM:
CT HEAD WITHOUT CONTRAST
TECHNIQUE: Contiguous axial images were obtained from the base of the skull
through the vertex without intravenous contrast.

[Series 2: head w o · axial · 0.46mm/px · z∈[+25,+155]mm · 9 of 32 slices shown, 12 images]
[im 3/32  brain]
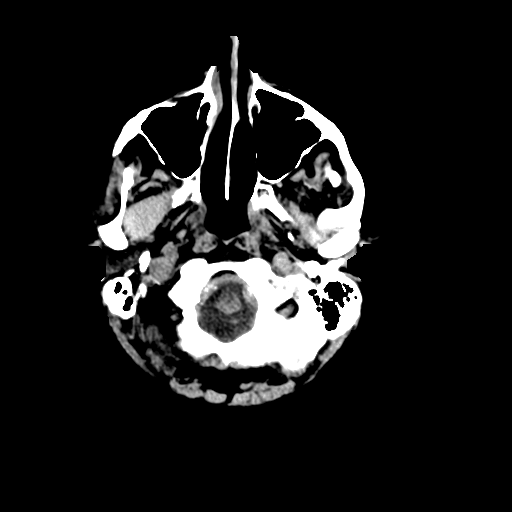
[im 3/32  bone]
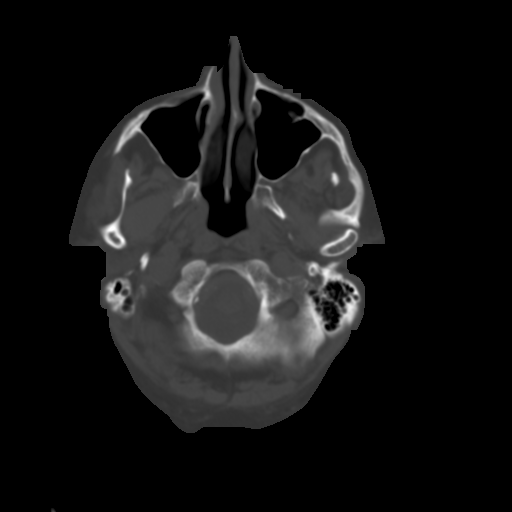
[im 6/32  brain]
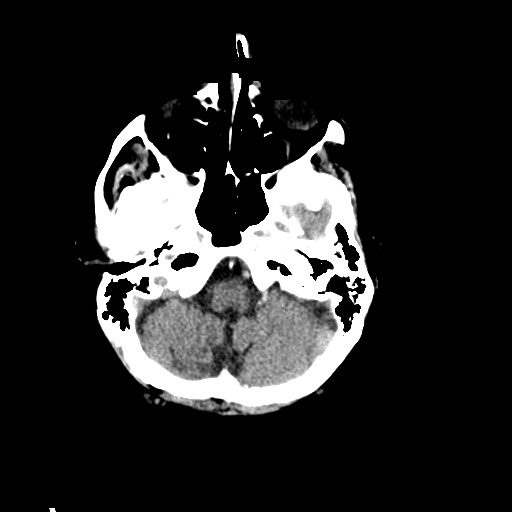
[im 9/32  brain]
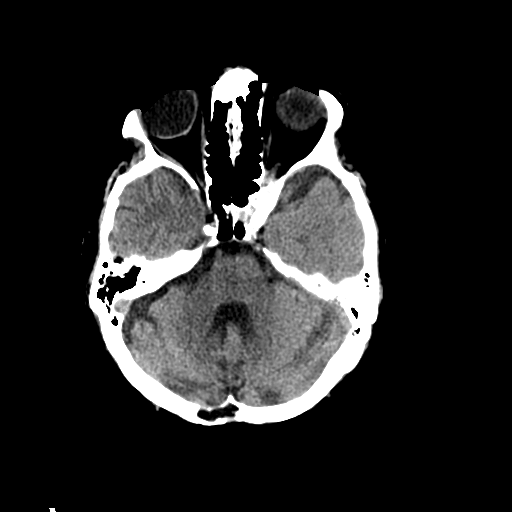
[im 12/32  brain]
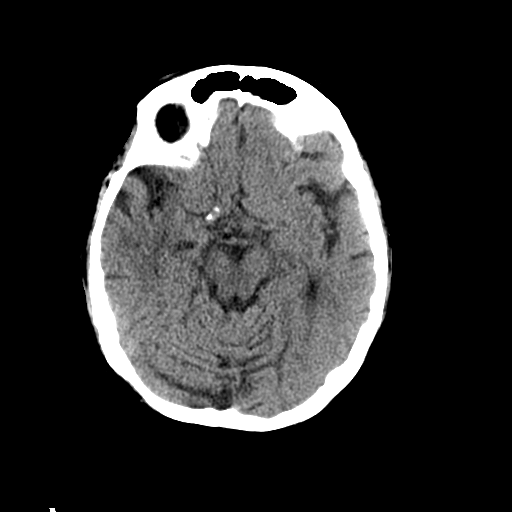
[im 17/32  brain]
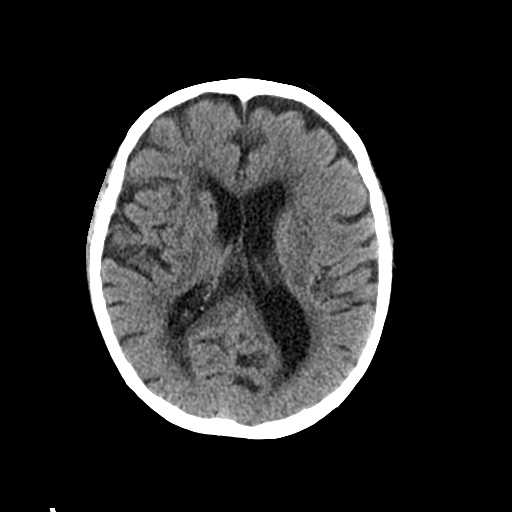
[im 17/32  bone]
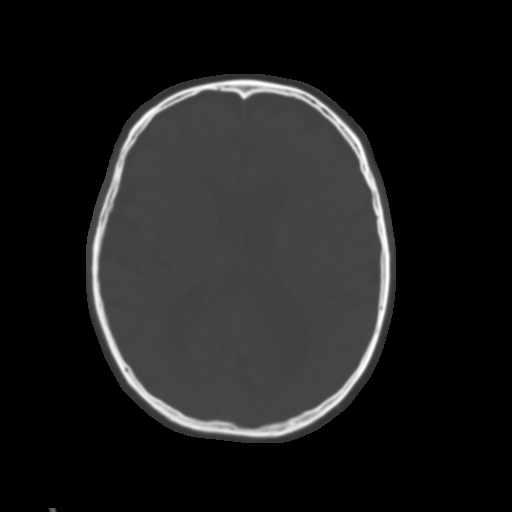
[im 20/32  brain]
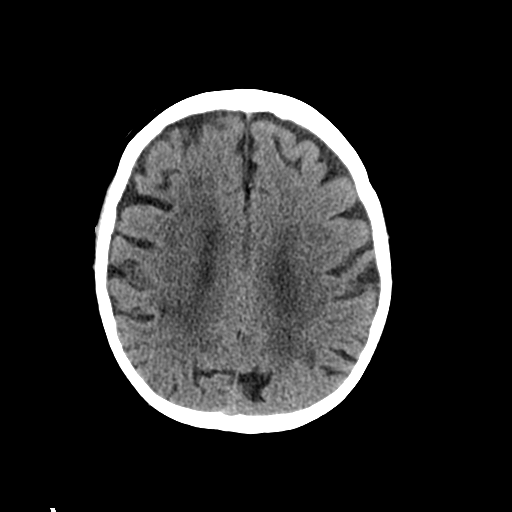
[im 23/32  brain]
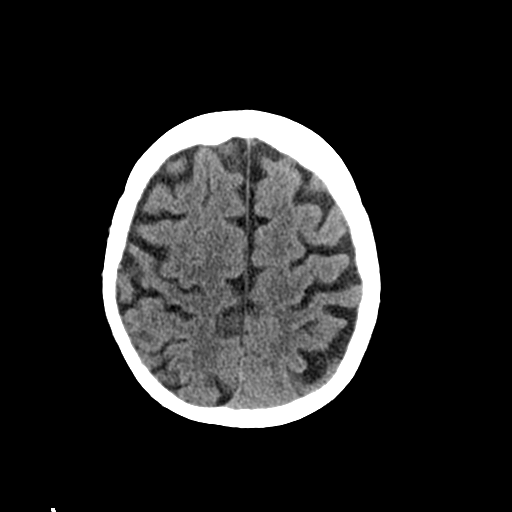
[im 26/32  brain]
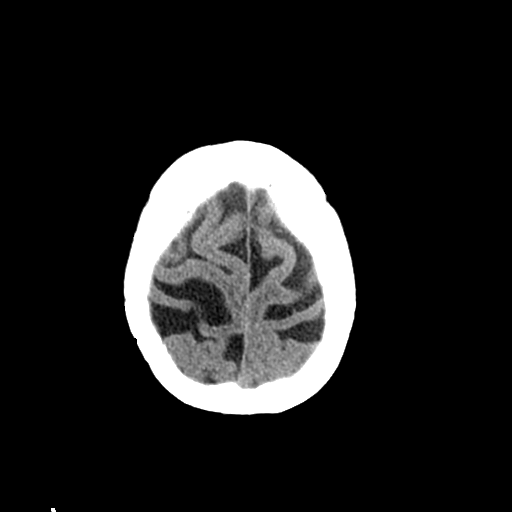
[im 29/32  brain]
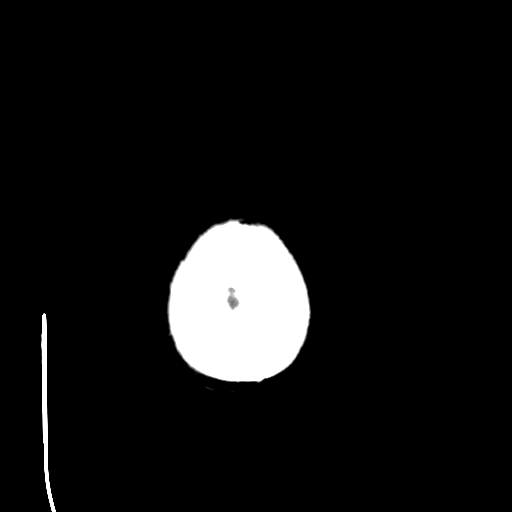
[im 29/32  bone]
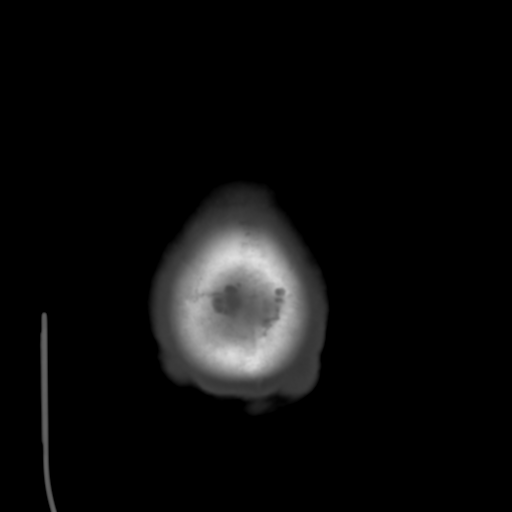

[Series 4: coronal soft · coronal · 0.32mm/px · 3 of 70 slices shown]
[im 24/70  brain]
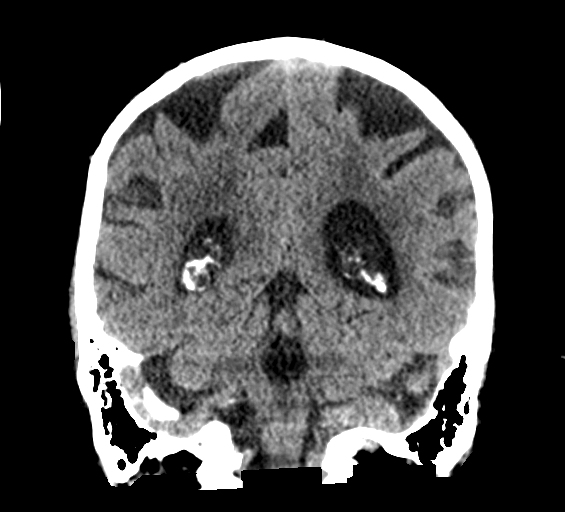
[im 31/70  brain]
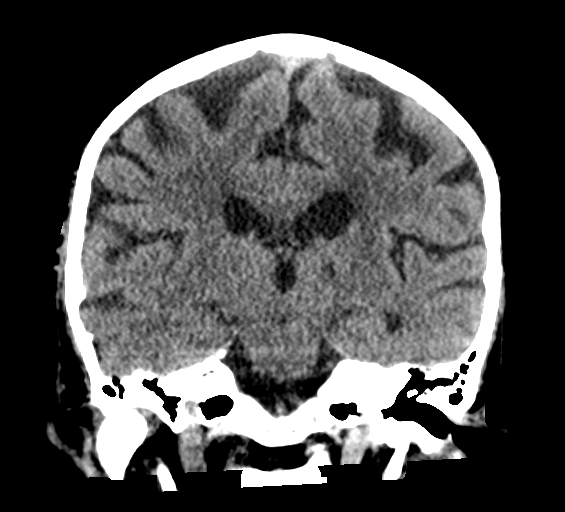
[im 39/70  brain]
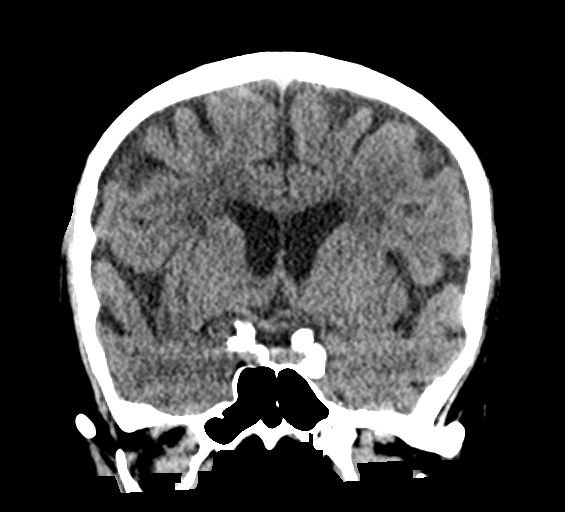

[Series 5: sagittal soft · sagittal · 0.32mm/px · 3 of 58 slices shown]
[im 20/58  brain]
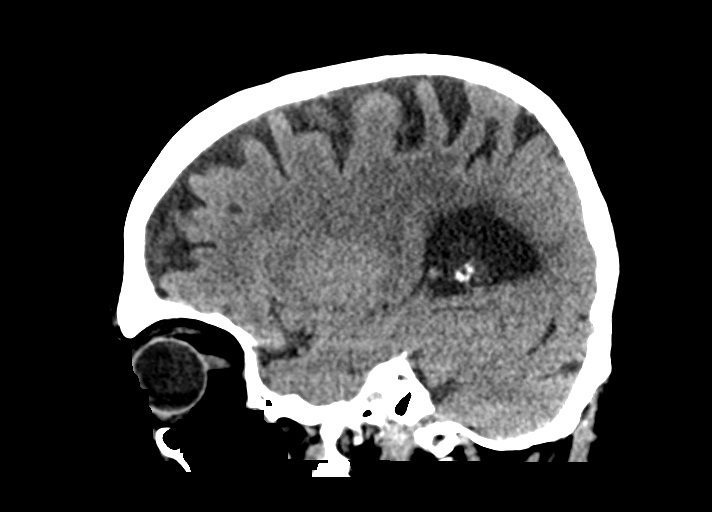
[im 29/58  brain]
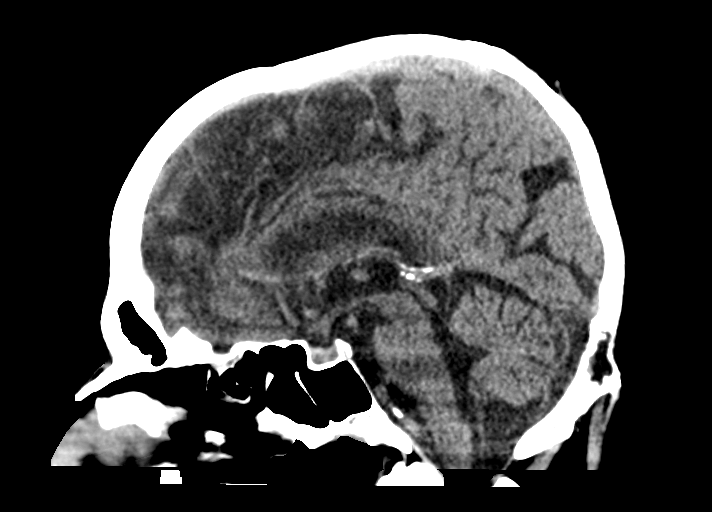
[im 39/58  brain]
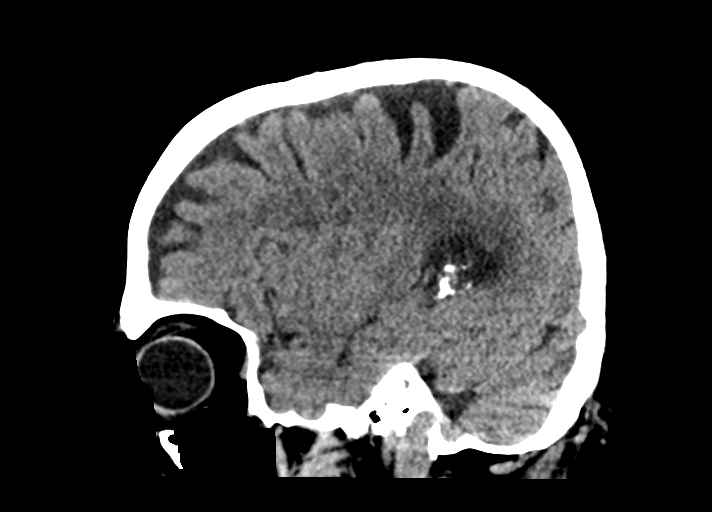

[15 of 47 positions shown; findings below may reference images not displayed]

FINDINGS: Brain: Mild chronic ischemic white matter disease is noted. Mild
diffuse cortical atrophy is noted. No mass effect or midline shift
is noted. Ventricular size is within normal limits. There is no
evidence of mass lesion, hemorrhage or acute infarction.

Vascular: No hyperdense vessel or unexpected calcification.

Skull: Normal. Negative for fracture or focal lesion.

Sinuses/Orbits: No acute finding.

Other: None.
IMPRESSION: No acute intracranial abnormality seen.

## 2021-01-18 IMAGING — MR MR HEAD W/O CM
11 of 13 series · 27 of 48 positions shown · non-contrast
Comparison: MRI/MRA [DATE].

CLINICAL DATA: Neuro deficit, acute stroke suspected.

EXAM:
MRI HEAD WITHOUT CONTRAST
MRA HEAD WITHOUT CONTRAST
TECHNIQUE: Multiplanar, multi-echo pulse sequences of the brain and surrounding
structures were acquired without intravenous contrast. Angiographic
images of the Circle of Willis were acquired using MRA technique
without intravenous contrast.

[Series 5: DWI · axial · 4.0mm · 0.88mm/px · z∈[-99,+36]mm · 4 of 36 slices shown (1 of 6)]
[im 1/36]
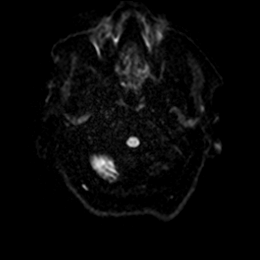
[im 12/36]
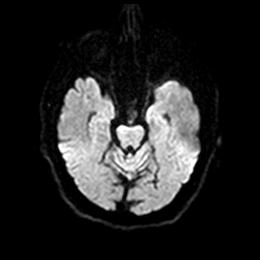
[im 24/36]
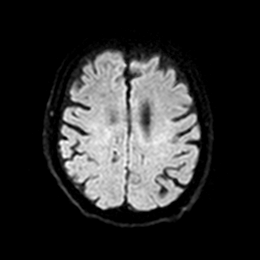
[im 36/36]
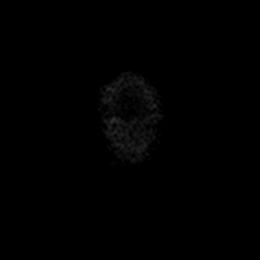

[Series 5: DWI · axial · 4.0mm · 0.88mm/px · z∈[-99,+36]mm · 3 of 36 slices shown (2 of 6)]
[im 1/36]
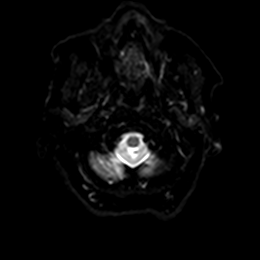
[im 18/36]
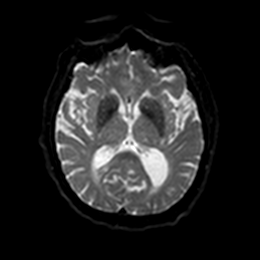
[im 36/36]
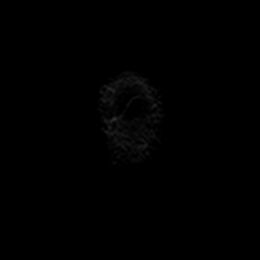

[Series 6: DWI · axial · 4.0mm · 0.88mm/px · z∈[-99,+36]mm · 3 of 36 slices shown (3 of 6)]
[im 1/36]
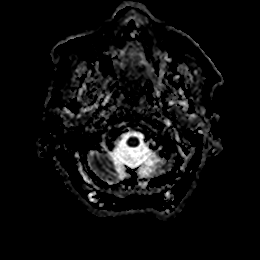
[im 18/36]
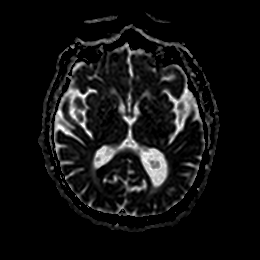
[im 36/36]
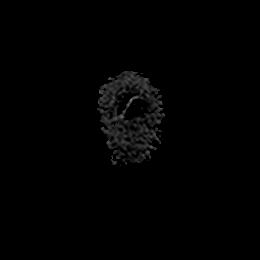

[Series 7: DWI · coronal · 5.0mm · 0.88mm/px · 2 of 28 slices shown (4 of 6)]
[im 1/28]
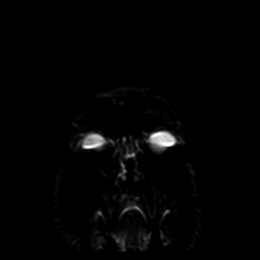
[im 28/28]
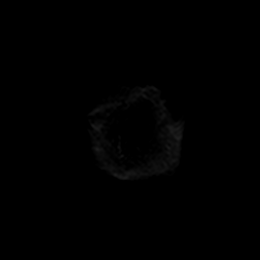

[Series 7: DWI · coronal · 5.0mm · 0.88mm/px · 2 of 28 slices shown (5 of 6)]
[im 1/28]
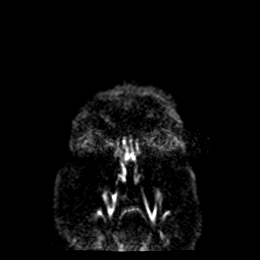
[im 28/28]
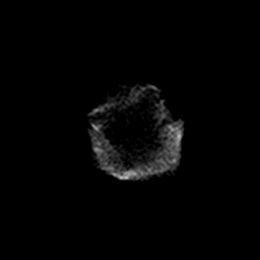

[Series 8: DWI · coronal · 5.0mm · 0.88mm/px · 2 of 28 slices shown (6 of 6)]
[im 1/28]
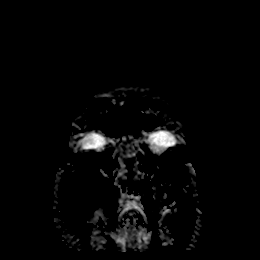
[im 28/28]
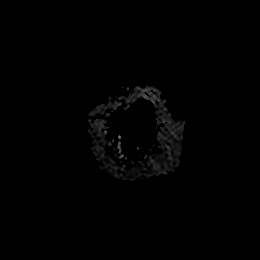

[Series 9: T1 · sagittal · 5.0mm · 0.94mm/px · 2 of 25 slices shown]
[im 1/25]
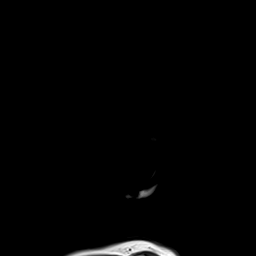
[im 25/25]
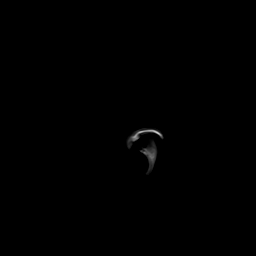

[Series 15: T2 · axial · 5.0mm · 0.72mm/px · z∈[-95,+32]mm · 2 of 20 slices shown (1 of 2)]
[im 1/20]
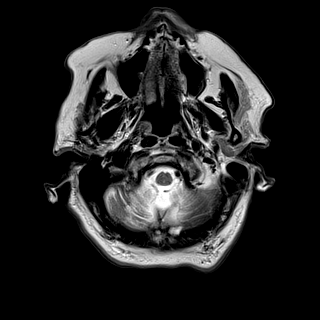
[im 20/20]
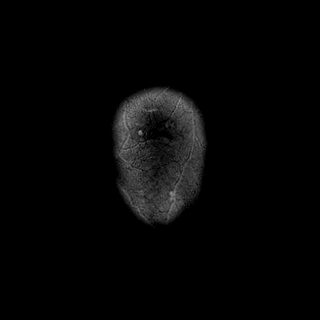

[Series 16: ax hemo · axial · 5.0mm · 0.86mm/px · z∈[-98,+40]mm · 2 of 25 slices shown]
[im 1/25]
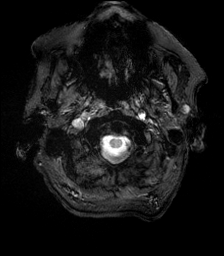
[im 25/25]
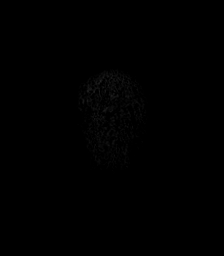

[Series 17: FLAIR · axial · 4.0mm · 0.43mm/px · z∈[-88,+31]mm · 3 of 32 slices shown]
[im 1/32]
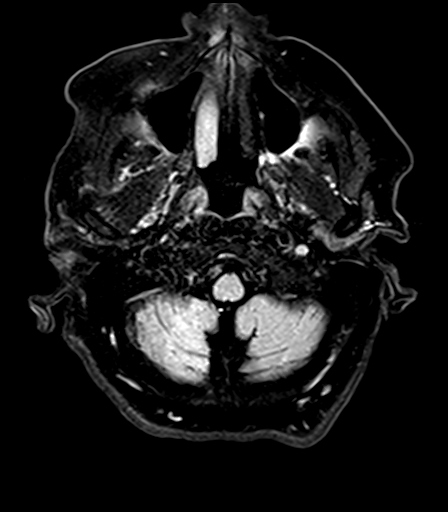
[im 16/32]
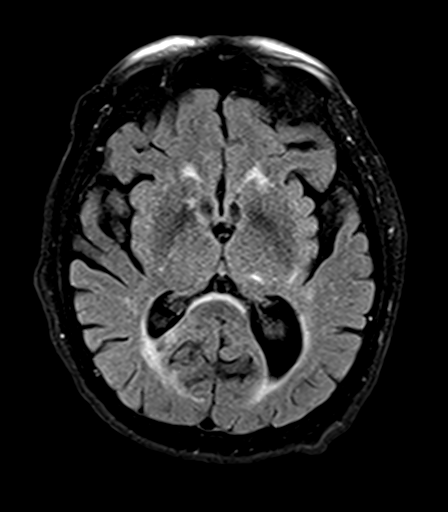
[im 32/32]
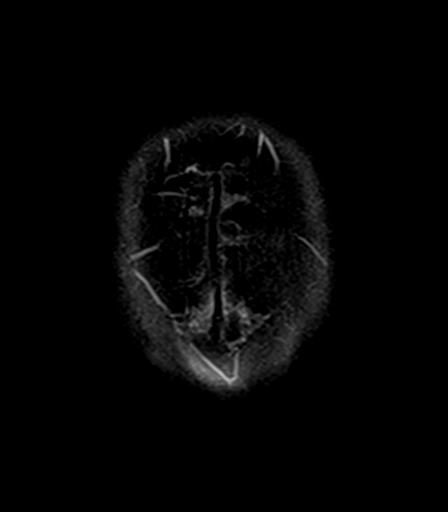

[Series 19: T2 · coronal · 5.0mm · 0.72mm/px · 2 of 28 slices shown (2 of 2)]
[im 1/28]
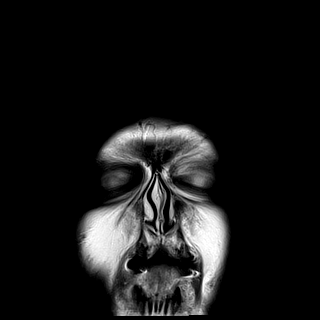
[im 28/28]
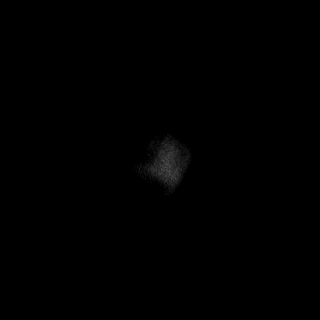

[27 of 48 positions shown; findings below may reference images not displayed]

FINDINGS: MRI HEAD FINDINGS

Brain: Acute infarct in the posterolateral right medulla (see series
5, image 4). Associated mild edema without mass effect. Additional
acute right PCA territory infarcts in the right occipital lobe and
posterior right corpus callosum. Developing encephalomalacia in the
areas of previously seen infarct in the inferior right cerebellum.
Remote lacunar infarcts in the left thalamus and bilateral corona
radiata and right caudate. Additional moderate scattered T2/FLAIR
hyperintensities within the white matter, compatible with chronic
microvascular ischemic disease.

Vascular: See below.

Skull and upper cervical spine: Normal marrow signal. Degenerative
change of the cervical spine, partially imaged.

Sinuses/Orbits: Sinuses are largely clear.  Unremarkable orbits.

Other: No sizable mastoid effusion.

MRA HEAD FINDINGS

Anterior circulation: Bilateral ICAs are patent. Severe stenosis of
the distal left M1 MCA. Severe stenosis of bilateral M2 MCA
branches. Bilateral A1 ACAs are patent. Multifocal severe stenosis
of the right A2 ACA and severe stenosis of the proximal left A2 ACA.

Posterior circulation: Similar multifocal moderate right RADZMIL of
the visualized intradural vertebral artery. The more proximal V3
vertebral artery is not imaged on this study. The proximal right
PICA is patent with no flow related signal in the more distal PICA,
similar to prior. The intradural left vertebral artery and the
basilar artery are patent. Severe stenosis of bilateral P2 PCAs with
poor flow related signal in the more distal PCAs.
IMPRESSION: MRI:

1. Acute infarct in the posterolateral right medulla, likely right
PICA territory. Additional acute right PCA territory infarcts in the
right occipital lobe and posterior right corpus callosum. Associated
mild edema without mass effect.
2. Developing encephalomalacia in the areas of previously seen
infarct in the inferior right cerebellum.
3. Remote lacunar infarcts in the left thalamus, right caudate, and
bilateral corona radiata.
4. Moderate chronic microvascular disease.

MRA:

1. Severe stenosis of bilateral P2 PCAs with poor flow related
signal in the more distal PCAs. Also, severe stenosis of the distal
left M1 MCA, bilateral M2 MCA branches and bilateral A2 ACAs.
2. Similar multifocal moderate narrowing of the distal right
intradural vertebral artery. Similar flow related signal in the
proximal right PICA, but not the more distal right PICA.

## 2021-01-18 IMAGING — MR MR MRA HEAD W/O CM
11 of 13 series · 27 of 48 positions shown · non-contrast
Comparison: MRI/MRA [DATE].

CLINICAL DATA: Neuro deficit, acute stroke suspected.

EXAM:
MRI HEAD WITHOUT CONTRAST
MRA HEAD WITHOUT CONTRAST
TECHNIQUE: Multiplanar, multi-echo pulse sequences of the brain and surrounding
structures were acquired without intravenous contrast. Angiographic
images of the Circle of Willis were acquired using MRA technique
without intravenous contrast.

[Series 5: DWI · axial · 4.0mm · 0.88mm/px · z∈[-99,+36]mm · 4 of 36 slices shown (1 of 6)]
[im 1/36]
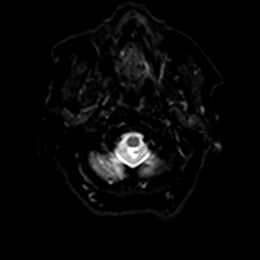
[im 12/36]
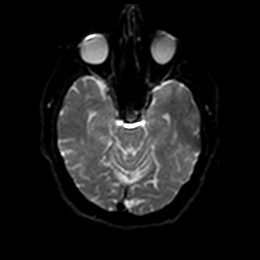
[im 24/36]
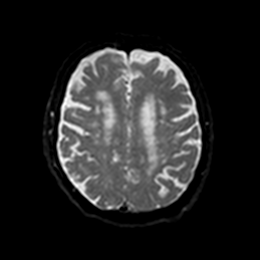
[im 36/36]
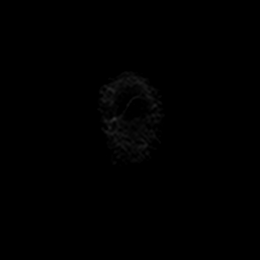

[Series 5: DWI · axial · 4.0mm · 0.88mm/px · z∈[-99,+36]mm · 3 of 36 slices shown (2 of 6)]
[im 1/36]
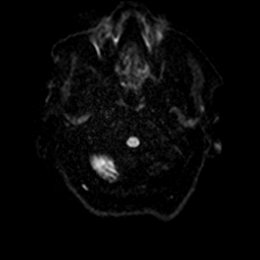
[im 18/36]
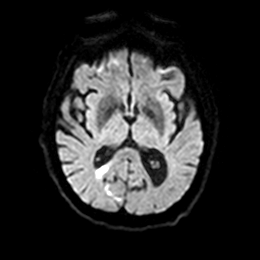
[im 36/36]
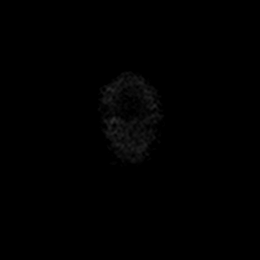

[Series 6: DWI · axial · 4.0mm · 0.88mm/px · z∈[-99,+36]mm · 3 of 36 slices shown (3 of 6)]
[im 1/36]
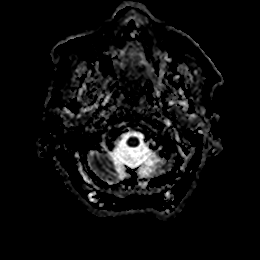
[im 18/36]
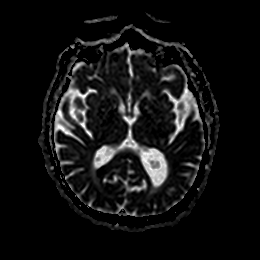
[im 36/36]
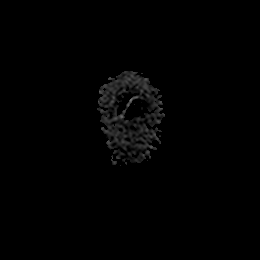

[Series 7: DWI · coronal · 5.0mm · 0.88mm/px · 2 of 28 slices shown (4 of 6)]
[im 1/28]
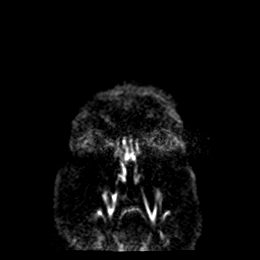
[im 28/28]
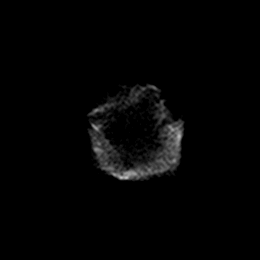

[Series 7: DWI · coronal · 5.0mm · 0.88mm/px · 2 of 28 slices shown (5 of 6)]
[im 1/28]
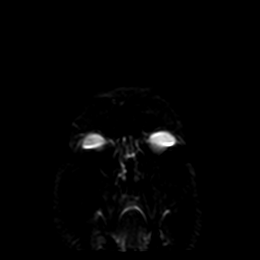
[im 28/28]
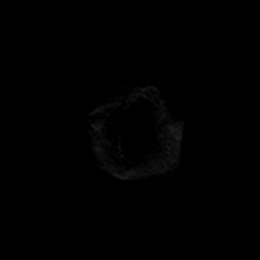

[Series 8: DWI · coronal · 5.0mm · 0.88mm/px · 2 of 28 slices shown (6 of 6)]
[im 1/28]
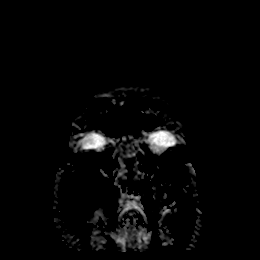
[im 28/28]
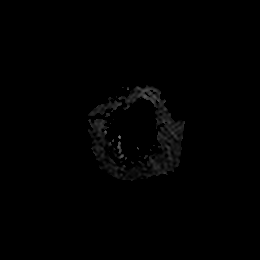

[Series 9: T1 · sagittal · 5.0mm · 0.94mm/px · 2 of 25 slices shown]
[im 1/25]
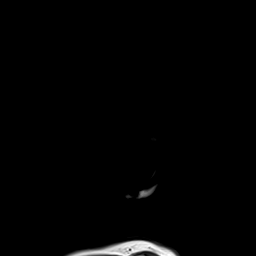
[im 25/25]
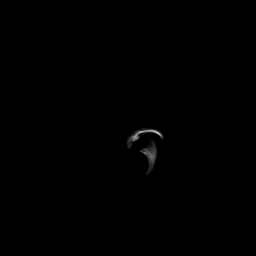

[Series 15: T2 · axial · 5.0mm · 0.72mm/px · z∈[-95,+32]mm · 2 of 20 slices shown (1 of 2)]
[im 1/20]
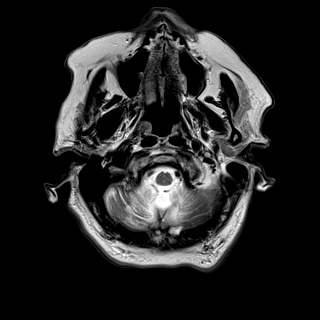
[im 20/20]
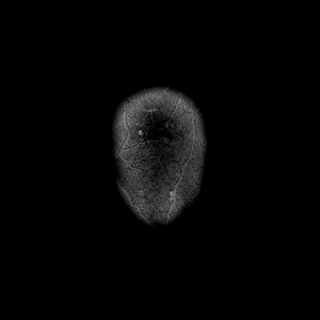

[Series 16: ax hemo · axial · 5.0mm · 0.86mm/px · z∈[-98,+40]mm · 2 of 25 slices shown]
[im 1/25]
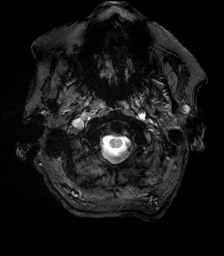
[im 25/25]
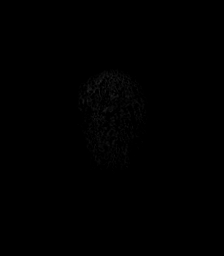

[Series 17: FLAIR · axial · 4.0mm · 0.43mm/px · z∈[-88,+31]mm · 3 of 32 slices shown]
[im 1/32]
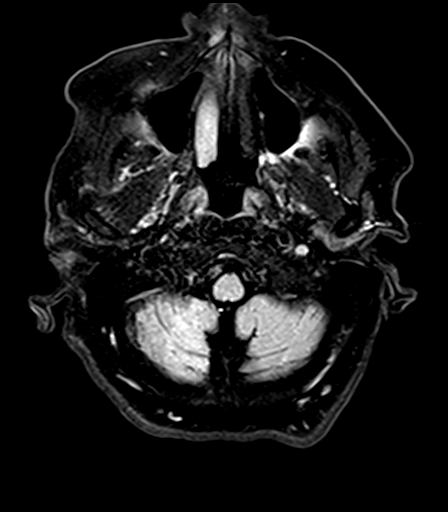
[im 16/32]
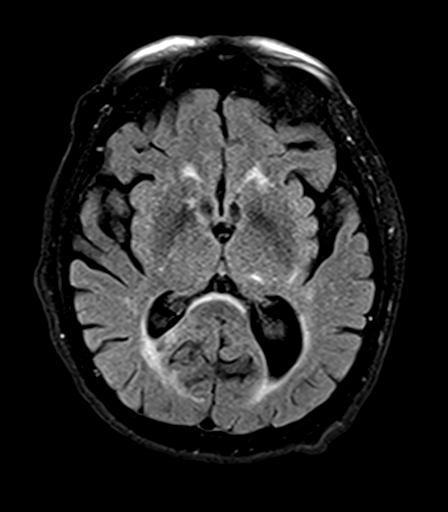
[im 32/32]
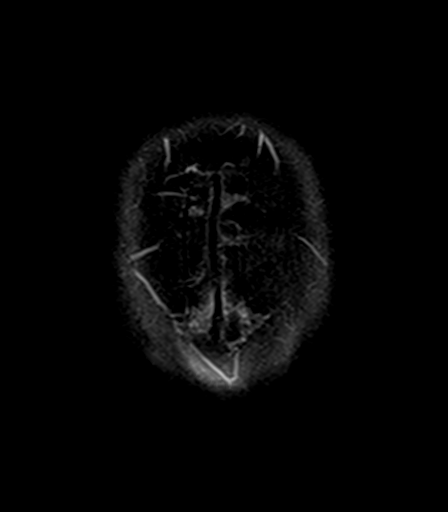

[Series 19: T2 · coronal · 5.0mm · 0.72mm/px · 2 of 28 slices shown (2 of 2)]
[im 1/28]
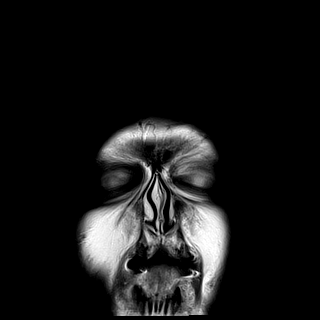
[im 28/28]
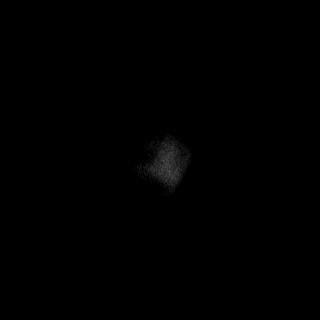

[27 of 48 positions shown; findings below may reference images not displayed]

FINDINGS: MRI HEAD FINDINGS

Brain: Acute infarct in the posterolateral right medulla (see series
5, image 4). Associated mild edema without mass effect. Additional
acute right PCA territory infarcts in the right occipital lobe and
posterior right corpus callosum. Developing encephalomalacia in the
areas of previously seen infarct in the inferior right cerebellum.
Remote lacunar infarcts in the left thalamus and bilateral corona
radiata and right caudate. Additional moderate scattered T2/FLAIR
hyperintensities within the white matter, compatible with chronic
microvascular ischemic disease.

Vascular: See below.

Skull and upper cervical spine: Normal marrow signal. Degenerative
change of the cervical spine, partially imaged.

Sinuses/Orbits: Sinuses are largely clear.  Unremarkable orbits.

Other: No sizable mastoid effusion.

MRA HEAD FINDINGS

Anterior circulation: Bilateral ICAs are patent. Severe stenosis of
the distal left M1 MCA. Severe stenosis of bilateral M2 MCA
branches. Bilateral A1 ACAs are patent. Multifocal severe stenosis
of the right A2 ACA and severe stenosis of the proximal left A2 ACA.

Posterior circulation: Similar multifocal moderate right RADZMIL of
the visualized intradural vertebral artery. The more proximal V3
vertebral artery is not imaged on this study. The proximal right
PICA is patent with no flow related signal in the more distal PICA,
similar to prior. The intradural left vertebral artery and the
basilar artery are patent. Severe stenosis of bilateral P2 PCAs with
poor flow related signal in the more distal PCAs.
IMPRESSION: MRI:

1. Acute infarct in the posterolateral right medulla, likely right
PICA territory. Additional acute right PCA territory infarcts in the
right occipital lobe and posterior right corpus callosum. Associated
mild edema without mass effect.
2. Developing encephalomalacia in the areas of previously seen
infarct in the inferior right cerebellum.
3. Remote lacunar infarcts in the left thalamus, right caudate, and
bilateral corona radiata.
4. Moderate chronic microvascular disease.

MRA:

1. Severe stenosis of bilateral P2 PCAs with poor flow related
signal in the more distal PCAs. Also, severe stenosis of the distal
left M1 MCA, bilateral M2 MCA branches and bilateral A2 ACAs.
2. Similar multifocal moderate narrowing of the distal right
intradural vertebral artery. Similar flow related signal in the
proximal right PICA, but not the more distal right PICA.

## 2021-01-18 MED ORDER — SODIUM CHLORIDE 0.9 % IV SOLN
1.0000 g | Freq: Once | INTRAVENOUS | Status: AC
  Administered 2021-01-18: 1 g via INTRAVENOUS
  Filled 2021-01-18: qty 10

## 2021-01-18 MED ORDER — ACETAMINOPHEN 650 MG RE SUPP: 650.0000 mg | Freq: Four times a day (QID) | RECTAL | Status: DC | PRN

## 2021-01-18 MED ORDER — POLYETHYLENE GLYCOL 3350 17 G PO PACK: 17.0000 g | PACK | Freq: Every day | ORAL | Status: DC | PRN

## 2021-01-18 MED ORDER — ASPIRIN EC 81 MG PO TBEC
81.0000 mg | DELAYED_RELEASE_TABLET | Freq: Every day | ORAL | Status: DC
  Administered 2021-01-18 – 2021-01-22 (×5): 81 mg via ORAL
  Filled 2021-01-18 (×5): qty 1

## 2021-01-18 MED ORDER — ONDANSETRON HCL 4 MG PO TABS: 4.0000 mg | ORAL_TABLET | Freq: Four times a day (QID) | ORAL | Status: DC | PRN

## 2021-01-18 MED ORDER — INSULIN ASPART 100 UNIT/ML IJ SOLN
0.0000 [IU] | Freq: Every day | INTRAMUSCULAR | Status: DC
  Administered 2021-01-18 – 2021-01-21 (×2): 2 [IU] via SUBCUTANEOUS

## 2021-01-18 MED ORDER — INSULIN ASPART 100 UNIT/ML IJ SOLN
0.0000 [IU] | Freq: Three times a day (TID) | INTRAMUSCULAR | Status: DC
  Administered 2021-01-19: 3 [IU] via SUBCUTANEOUS
  Administered 2021-01-19: 5 [IU] via SUBCUTANEOUS
  Administered 2021-01-19 – 2021-01-20 (×4): 3 [IU] via SUBCUTANEOUS
  Administered 2021-01-21: 5 [IU] via SUBCUTANEOUS
  Administered 2021-01-21 (×2): 8 [IU] via SUBCUTANEOUS
  Administered 2021-01-22: 3 [IU] via SUBCUTANEOUS
  Administered 2021-01-22: 5 [IU] via SUBCUTANEOUS

## 2021-01-18 MED ORDER — CLOPIDOGREL BISULFATE 75 MG PO TABS: 75.0000 mg | ORAL_TABLET | Freq: Every day | ORAL | Status: DC

## 2021-01-18 MED ORDER — SODIUM CHLORIDE 0.9 % IV SOLN
1.0000 g | INTRAVENOUS | Status: DC
  Administered 2021-01-19 – 2021-01-20 (×2): 1 g via INTRAVENOUS
  Filled 2021-01-18: qty 10

## 2021-01-18 MED ORDER — ACETAMINOPHEN 325 MG PO TABS: 650.0000 mg | ORAL_TABLET | Freq: Four times a day (QID) | ORAL | Status: DC | PRN

## 2021-01-18 MED ORDER — LEVOTHYROXINE SODIUM 75 MCG PO TABS
75.0000 ug | ORAL_TABLET | Freq: Every day | ORAL | Status: DC
  Administered 2021-01-19 – 2021-01-22 (×4): 75 ug via ORAL
  Filled 2021-01-18 (×4): qty 1

## 2021-01-18 MED ORDER — ONDANSETRON HCL 4 MG/2ML IJ SOLN
4.0000 mg | Freq: Four times a day (QID) | INTRAMUSCULAR | Status: DC | PRN
  Administered 2021-01-21: 4 mg via INTRAVENOUS

## 2021-01-18 MED ORDER — ATORVASTATIN CALCIUM 40 MG PO TABS
40.0000 mg | ORAL_TABLET | Freq: Every day | ORAL | Status: DC
  Administered 2021-01-18 – 2021-01-21 (×4): 40 mg via ORAL
  Filled 2021-01-18 (×4): qty 1

## 2021-01-18 NOTE — ED Notes (Signed)
Patient transported to MRI 

## 2021-01-18 NOTE — ED Notes (Signed)
ED TO INPATIENT HANDOFF REPORT  ED Nurse Name and Phone #:   S Name/Age/Gender Jason Cox 75 y.o. male Room/Bed: APA10/APA10  Code Status   Code Status: Prior  Home/SNF/Other Home Patient oriented to: self, place, time and situation Is this baseline? Yes   Triage Complete: Triage complete  Chief Complaint Acute CVA (cerebrovascular accident) [facility]) [I63.9]  Triage Note Pt states that he feels pretty good right now, but yesterday at 12 o'clock noon he started having some weakness to his right side which has resolved.    Allergies No Known Allergies  Level of Care/Admitting Diagnosis ED Disposition    ED Disposition  Admit   Condition  --   Comment  Hospital Area: [facility] [[id number]]  Level of Care: Telemetry [5]  Covid Evaluation: Asymptomatic Screening Protocol (No Symptoms)  Diagnosis: Acute CVA (cerebrovascular accident) [facility]) [[id number]]  Admitting Physician: Onnie Boer [phone]  Attending Physician: Onnie Boer [phone]  Estimated length of stay: past midnight tomorrow  Certification:: I certify this patient will need inpatient services for at least 2 midnights         B Medical/Surgery History Past Medical History:  Diagnosis Date  . Hypertension    Benign Essential  . Hypothyroidism   . Obesity   . Osteoarthritis   . PVD (peripheral vascular disease) (HCC)   . Type 2 diabetes mellitus (HCC)    Past Surgical History:  Procedure Laterality Date  . APPENDECTOMY    . TONSILLECTOMY       A IV Location/Drains/Wounds Patient Lines/Drains/Airways Status    Active Line/Drains/Airways    Name Placement date Placement time Site Days   Peripheral IV 01/18/21 20 G 1" Right Forearm 01/18/21  1225  Forearm  less than 1          Intake/Output Last 24 hours  Intake/Output Summary (Last 24 hours) at 01/18/2021 1823 Last data filed at 01/18/2021 1806 Gross per 24 hour  Intake 100 ml  Output --  Net 100 ml     Labs/Imaging Results for orders placed or performed during the hospital encounter of 01/18/21 (from the past 48 hour(s))  Resp Panel by RT-PCR (Flu A&B, Covid) Nasopharyngeal Swab     Status: None   Collection Time: 01/18/21 11:56 AM   Specimen: Nasopharyngeal Swab; Nasopharyngeal(NP) swabs in vial transport medium  Result Value Ref Range   SARS Coronavirus 2 by RT PCR NEGATIVE NEGATIVE    Comment: (NOTE) SARS-CoV-2 target nucleic acids are NOT DETECTED.  The SARS-CoV-2 RNA is generally detectable in upper respiratory specimens during the acute phase of infection. The lowest concentration of SARS-CoV-2 viral copies this assay can detect is 138 copies/mL. A negative result does not preclude SARS-Cov-2 infection and should not be used as the sole basis for treatment or other patient management decisions. A negative result may occur with  improper specimen collection/handling, submission of specimen other than nasopharyngeal swab, presence of viral mutation(s) within the areas targeted by this assay, and inadequate number of viral copies(<138 copies/mL). A negative result must be combined with clinical observations, patient history, and epidemiological information. The expected result is Negative.  Fact Sheet for Patients:  BloggerCourse.com  Fact Sheet for Healthcare Providers:  SeriousBroker.it  This test is no t yet approved or cleared by the [place] FDA and  has been authorized for detection and/or diagnosis of SARS-CoV-2 by FDA under an Emergency Use Authorization (EUA). This EUA will remain  in effect (meaning this test can be used) for  the duration of the COVID-19 declaration under Section 564(b)(1) of the Act, 21 U.S.C.section 360bbb-3(b)(1), unless the authorization is terminated  or revoked sooner.       Influenza A by PCR NEGATIVE NEGATIVE   Influenza B by PCR NEGATIVE NEGATIVE    Comment: (NOTE) The Xpert  Xpress SARS-CoV-2/FLU/RSV plus assay is intended as an aid in the diagnosis of influenza from Nasopharyngeal swab specimens and should not be used as a sole basis for treatment. Nasal washings and aspirates are unacceptable for Xpert Xpress SARS-CoV-2/FLU/RSV testing.  Fact Sheet for Patients: BloggerCourse.com  Fact Sheet for Healthcare Providers: SeriousBroker.it  This test is not yet approved or cleared by the [place] FDA and has been authorized for detection and/or diagnosis of SARS-CoV-2 by FDA under an Emergency Use Authorization (EUA). This EUA will remain in effect (meaning this test can be used) for the duration of the COVID-19 declaration under Section 564(b)(1) of the Act, 21 U.S.C. section 360bbb-3(b)(1), unless the authorization is terminated or revoked.  Performed at [facility], [address] [place], [place] [address]   Urine rapid drug screen (hosp performed)     Status: None   Collection Time: 01/18/21 12:02 PM  Result Value Ref Range   Opiates NONE DETECTED NONE DETECTED   Cocaine NONE DETECTED NONE DETECTED   Benzodiazepines NONE DETECTED NONE DETECTED   Amphetamines NONE DETECTED NONE DETECTED   Tetrahydrocannabinol NONE DETECTED NONE DETECTED   Barbiturates NONE DETECTED NONE DETECTED    Comment: (NOTE) DRUG SCREEN FOR MEDICAL PURPOSES ONLY.  IF CONFIRMATION IS NEEDED FOR ANY PURPOSE, NOTIFY LAB WITHIN 5 DAYS.  LOWEST DETECTABLE LIMITS FOR URINE DRUG SCREEN Drug Class                     Cutoff (ng/mL) Amphetamine and metabolites    1000 Barbiturate and metabolites    200 Benzodiazepine                 200 Tricyclics and metabolites     300 Opiates and metabolites        300 Cocaine and metabolites        300 THC                            50 Performed at [facility], [address], [place], [place] [address]   Urinalysis, Routine w reflex microscopic Urine, Clean Catch     Status:  Abnormal   Collection Time: 01/18/21 12:02 PM  Result Value Ref Range   Color, Urine YELLOW YELLOW   APPearance CLOUDY (A) CLEAR   Specific Gravity, Urine 1.013 1.005 - 1.030   pH 7.0 5.0 - 8.0   Glucose, UA >=500 (A) NEGATIVE mg/dL   Hgb urine dipstick MODERATE (A) NEGATIVE   Bilirubin Urine NEGATIVE NEGATIVE   Ketones, ur 20 (A) NEGATIVE mg/dL   Protein, ur 098 (A) NEGATIVE mg/dL   Nitrite NEGATIVE NEGATIVE   Leukocytes,Ua LARGE (A) NEGATIVE   RBC / HPF >50 (H) 0 - 5 RBC/hpf   WBC, UA >50 (H) 0 - 5 WBC/hpf   Bacteria, UA FEW (A) NONE SEEN   WBC Clumps PRESENT    Ca Oxalate Crys, UA PRESENT     Comment: Performed at [facility], [address], [place], [place] [address]  Ethanol     Status: None   Collection Time: 01/18/21 12:17 PM  Result Value Ref Range   Alcohol, Ethyl (B) <  10 <10 mg/dL    Comment: (NOTE) Lowest detectable limit for serum alcohol is 10 mg/dL.  For medical purposes only. Performed at [facility], [address] [place], [place] [address]   Protime-INR     Status: None   Collection Time: 01/18/21 12:17 PM  Result Value Ref Range   Prothrombin Time 12.7 11.4 - 15.2 seconds   INR 1.0 0.8 - 1.2    Comment: (NOTE) INR goal varies based on device and disease states. Performed at [facility], [address] [place], [place] [address]   APTT     Status: None   Collection Time: 01/18/21 12:17 PM  Result Value Ref Range   aPTT 25 24 - 36 seconds    Comment: Performed at [facility], [address], [place], [place] [address]  CBC     Status: Abnormal   Collection Time: 01/18/21 12:17 PM  Result Value Ref Range   WBC 13.5 (H) 4.0 - 10.5 K/uL   RBC 4.20 (L) 4.22 - 5.81 MIL/uL   Hemoglobin 13.2 13.0 - 17.0 g/dL   HCT 46.5 03.5 - 46.5 %   MCV 96.4 80.0 - 100.0 fL   MCH 31.4 26.0 - 34.0 pg   MCHC 32.6 30.0 - 36.0 g/dL   RDW 68.1 27.5 - 17.0 %   Platelets 377 150 - 400 K/uL   nRBC 0.0 0.0 - 0.2 %    Comment: Performed at [facility], [address] [place], [place] [address]  Differential     Status: Abnormal   Collection Time: 01/18/21 12:17 PM  Result Value Ref Range   Neutrophils Relative % 85 %   Neutro Abs 11.5 (H) 1.7 - 7.7 K/uL   Lymphocytes Relative 8 %   Lymphs Abs 1.1 0.7 - 4.0 K/uL   Monocytes Relative 6 %   Monocytes Absolute 0.8 0.1 - 1.0 K/uL   Eosinophils Relative 0 %   Eosinophils Absolute 0.0 0.0 - 0.5 K/uL   Basophils Relative 0 %   Basophils Absolute 0.0 0.0 - 0.1 K/uL   Immature Granulocytes 1 %   Abs Immature Granulocytes 0.09 (H) 0.00 - 0.07 K/uL    Comment: Performed at [facility], [address], [place], [place] [address]  Comprehensive metabolic panel     Status: Abnormal   Collection Time: 01/18/21 12:17 PM  Result Value Ref Range   Sodium 137 135 - 145 mmol/L   Potassium 4.9 3.5 - 5.1 mmol/L   Chloride 106 98 - 111 mmol/L   CO2 23 22 - 32 mmol/L   Glucose, Bld 260 (H) 70 - 99 mg/dL    Comment: Glucose reference range applies only to samples taken after fasting for at least 8 hours.   BUN 22 8 - 23 mg/dL   Creatinine, Ser 5.91 0.61 - 1.24 mg/dL   Calcium 9.6 8.9 - 63.8 mg/dL   Total Protein 8.2 (H) 6.5 - 8.1 g/dL   Albumin 3.6 3.5 - 5.0 g/dL   AST 12 (L) 15 - 41 U/L   ALT 16 0 - 44 U/L   Alkaline Phosphatase 161 (H) 38 - 126 U/L   Total Bilirubin 0.5 0.3 - 1.2 mg/dL   GFR, Estimated >46 >65 mL/min    Comment: (NOTE) Calculated using the CKD-EPI Creatinine Equation (2021)    Anion gap 8 5 - 15    Comment: Performed at [facility], [address], [place], [place] [address]  I-stat chem 8, ED  Status: Abnormal   Collection Time: 01/18/21 12:45 PM  Result Value Ref Range   Sodium 140 135 - 145 mmol/L   Potassium 5.2 (H) 3.5 - 5.1 mmol/L   Chloride 107 98 - 111 mmol/L   BUN 23 8 - 23 mg/dL   Creatinine, Ser 1.61 0.61 - 1.24 mg/dL   Glucose, Bld 096 (H) 70 - 99 mg/dL    Comment: Glucose reference range applies only to samples taken after fasting for at least 8 hours.   Calcium, Ion  1.29 1.15 - 1.40 mmol/L   TCO2 26 22 - 32 mmol/L   Hemoglobin 13.6 13.0 - 17.0 g/dL   HCT 04.5 40.9 - 81.1 %   CT HEAD WO CONTRAST  Result Date: 01/18/2021 CLINICAL DATA:  Right-sided weakness. EXAM: CT HEAD WITHOUT CONTRAST TECHNIQUE: Contiguous axial images were obtained from the base of the skull through the vertex without intravenous contrast. COMPARISON:  Nov 17, 2020. FINDINGS: Brain: Mild chronic ischemic white matter disease is noted. Mild diffuse cortical atrophy is noted. No mass effect or midline shift is noted. Ventricular size is within normal limits. There is no evidence of mass lesion, hemorrhage or acute infarction. Vascular: No hyperdense vessel or unexpected calcification. Skull: Normal. Negative for fracture or focal lesion. Sinuses/Orbits: No acute finding. Other: None. IMPRESSION: No acute intracranial abnormality seen. Electronically Signed   By: Lupita Raider M.D.   On: 01/18/2021 13:59   MR ANGIO HEAD WO CONTRAST  Result Date: 01/18/2021 CLINICAL DATA:  Neuro deficit, acute stroke suspected. EXAM: MRI HEAD WITHOUT CONTRAST MRA HEAD WITHOUT CONTRAST TECHNIQUE: Multiplanar, multi-echo pulse sequences of the brain and surrounding structures were acquired without intravenous contrast. Angiographic images of the Circle of Willis were acquired using MRA technique without intravenous contrast. COMPARISON:  MRI/MRA Nov 17, 2020. FINDINGS: MRI HEAD FINDINGS Brain: Acute infarct in the posterolateral right medulla (see series 5, image 4). Associated mild edema without mass effect. Additional acute right PCA territory infarcts in the right occipital lobe and posterior right corpus callosum. Developing encephalomalacia in the areas of previously seen infarct in the inferior right cerebellum. Remote lacunar infarcts in the left thalamus and bilateral corona radiata and right caudate. Additional moderate scattered T2/FLAIR hyperintensities within the white matter, compatible with chronic  microvascular ischemic disease. Vascular: See below. Skull and upper cervical spine: Normal marrow signal. Degenerative change of the cervical spine, partially imaged. Sinuses/Orbits: Sinuses are largely clear.  Unremarkable orbits. Other: No sizable mastoid effusion. MRA HEAD FINDINGS Anterior circulation: Bilateral ICAs are patent. Severe stenosis of the distal left M1 MCA. Severe stenosis of bilateral M2 MCA branches. Bilateral A1 ACAs are patent. Multifocal severe stenosis of the right A2 ACA and severe stenosis of the proximal left A2 ACA. Posterior circulation: Similar multifocal moderate right Peyton Najjar of the visualized intradural vertebral artery. The more proximal V3 vertebral artery is not imaged on this study. The proximal right PICA is patent with no flow related signal in the more distal PICA, similar to prior. The intradural left vertebral artery and the basilar artery are patent. Severe stenosis of bilateral P2 PCAs with poor flow related signal in the more distal PCAs. IMPRESSION: MRI: 1. Acute infarct in the posterolateral right medulla, likely right PICA territory. Additional acute right PCA territory infarcts in the right occipital lobe and posterior right corpus callosum. Associated mild edema without mass effect. 2. Developing encephalomalacia in the areas of previously seen infarct in the inferior right cerebellum. 3. Remote lacunar infarcts in the left  thalamus, right caudate, and bilateral corona radiata. 4. Moderate chronic microvascular disease. MRA: 1. Severe stenosis of bilateral P2 PCAs with poor flow related signal in the more distal PCAs. Also, severe stenosis of the distal left M1 MCA, bilateral M2 MCA branches and bilateral A2 ACAs. 2. Similar multifocal moderate narrowing of the distal right intradural vertebral artery. Similar flow related signal in the proximal right PICA, but not the more distal right PICA. Electronically Signed   By: Feliberto Harts MD   On: 01/18/2021 16:24    MR Brain Wo Contrast (neuro protocol)  Result Date: 01/18/2021 CLINICAL DATA:  Neuro deficit, acute stroke suspected. EXAM: MRI HEAD WITHOUT CONTRAST MRA HEAD WITHOUT CONTRAST TECHNIQUE: Multiplanar, multi-echo pulse sequences of the brain and surrounding structures were acquired without intravenous contrast. Angiographic images of the Circle of Willis were acquired using MRA technique without intravenous contrast. COMPARISON:  MRI/MRA Nov 17, 2020. FINDINGS: MRI HEAD FINDINGS Brain: Acute infarct in the posterolateral right medulla (see series 5, image 4). Associated mild edema without mass effect. Additional acute right PCA territory infarcts in the right occipital lobe and posterior right corpus callosum. Developing encephalomalacia in the areas of previously seen infarct in the inferior right cerebellum. Remote lacunar infarcts in the left thalamus and bilateral corona radiata and right caudate. Additional moderate scattered T2/FLAIR hyperintensities within the white matter, compatible with chronic microvascular ischemic disease. Vascular: See below. Skull and upper cervical spine: Normal marrow signal. Degenerative change of the cervical spine, partially imaged. Sinuses/Orbits: Sinuses are largely clear.  Unremarkable orbits. Other: No sizable mastoid effusion. MRA HEAD FINDINGS Anterior circulation: Bilateral ICAs are patent. Severe stenosis of the distal left M1 MCA. Severe stenosis of bilateral M2 MCA branches. Bilateral A1 ACAs are patent. Multifocal severe stenosis of the right A2 ACA and severe stenosis of the proximal left A2 ACA. Posterior circulation: Similar multifocal moderate right Peyton Najjar of the visualized intradural vertebral artery. The more proximal V3 vertebral artery is not imaged on this study. The proximal right PICA is patent with no flow related signal in the more distal PICA, similar to prior. The intradural left vertebral artery and the basilar artery are patent. Severe stenosis of  bilateral P2 PCAs with poor flow related signal in the more distal PCAs. IMPRESSION: MRI: 1. Acute infarct in the posterolateral right medulla, likely right PICA territory. Additional acute right PCA territory infarcts in the right occipital lobe and posterior right corpus callosum. Associated mild edema without mass effect. 2. Developing encephalomalacia in the areas of previously seen infarct in the inferior right cerebellum. 3. Remote lacunar infarcts in the left thalamus, right caudate, and bilateral corona radiata. 4. Moderate chronic microvascular disease. MRA: 1. Severe stenosis of bilateral P2 PCAs with poor flow related signal in the more distal PCAs. Also, severe stenosis of the distal left M1 MCA, bilateral M2 MCA branches and bilateral A2 ACAs. 2. Similar multifocal moderate narrowing of the distal right intradural vertebral artery. Similar flow related signal in the proximal right PICA, but not the more distal right PICA. Electronically Signed   By: Feliberto Harts MD   On: 01/18/2021 16:24    Pending Labs Unresulted Labs (From admission, onward)    Start     Ordered   01/18/21 1354  Urine Culture  Once,   STAT       Question:  Indication  Answer:  Dysuria   01/18/21 1354          Vitals/Pain Today's Vitals   01/18/21 1615 01/18/21  1626 01/18/21 1630 01/18/21 1730  BP: (!) 146/88  (!) 145/108 139/80  Pulse: 97  93 97  Resp: 13  17 15   Temp:      TempSrc:      SpO2: 96%  94% 91%  Weight:      Height:      PainSc:  0-No pain      Isolation Precautions No active isolations  Medications Medications  cefTRIAXone (ROCEPHIN) 1 g in sodium chloride 0.9 % 100 mL IVPB (0 g Intravenous Stopped 01/18/21 1806)    Mobility walks Low fall risk   Focused Assessments    R Recommendations: See Admitting Provider Note  Report given to:   Additional Notes:

## 2021-01-18 NOTE — Plan of Care (Signed)

## 2021-01-18 NOTE — ED Notes (Signed)
Patient transported to CT 

## 2021-01-18 NOTE — ED Triage Notes (Signed)
Pt states that he feels pretty good right now, but yesterday at 12 o'clock noon he started having some weakness to his right side which has resolved.

## 2021-01-18 NOTE — ED Provider Notes (Addendum)
[facility] EMERGENCY DEPARTMENT Provider Note   CSN: [id number] Arrival date & time: 01/18/21  4081     History Chief Complaint  Patient presents with   Weakness    Jason Cox is a 75 y.o. male.  Patient presenting with stating that his right leg is giving out.  Symptom onset was at 2100 last night.  Kind of falls to the right side.  Fell 3 times.  States he can move his leg okay but the coordination seems to be off.  Also in his right hand he has some numbness and perhaps may be a little bit of weakness.  Patient presented in May 24 with vertigo and had acute strokes also had acute cystitis with hematuria at that time.  Grew staph aureus.  Patient states he improved from that.  But he has now recurrent urinary tract symptoms.  Past medical history significant for hypertension hypothyroidism and peripheral vascular disease and diabetes.  Patient states he was getting better and until what occurred last evening.  No speech problem no visual problems.  No other motor weakness problems.      Past Medical History:  Diagnosis Date   Hypertension    Benign Essential   Hypothyroidism    Obesity    Osteoarthritis    PVD (peripheral vascular disease) (HCC)    Type 2 diabetes mellitus (HCC)     Patient Active Problem List   Diagnosis Date Noted   Acute cystitis without hematuria    Hyperlipidemia    Acute stroke due to ischemia (HCC) 11/17/2020   Essential hypertension 11/17/2020   Diabetes mellitus type 2 in nonobese (HCC) 11/17/2020   Acute lower UTI 11/17/2020   Mild protein-calorie malnutrition (HCC) 11/17/2020   Thyroid disease 11/17/2020    Past Surgical History:  Procedure Laterality Date   APPENDECTOMY     TONSILLECTOMY         Family History  Problem Relation Age of Onset   Osteoporosis Mother    Hypertension Mother    Cancer Mother    Heart attack Father    Heart attack Maternal Grandmother    Pneumonia Maternal Grandfather    Heart disease Paternal  Grandmother    Heart disease Paternal Grandfather     Social History   Tobacco Use   Smoking status: Former   Smokeless tobacco: Never  Building services engineer Use: Never used  Substance Use Topics   Alcohol use: Not Currently   Drug use: Never    Home Medications Prior to Admission medications   Medication Sig Start Date End Date Taking? Authorizing Provider  amLODipine (NORVASC) 10 MG tablet  12/10/19   [provider]  aspirin EC 81 MG tablet Take 1 tablet (81 mg total) by mouth daily. Swallow whole. 11/18/20 11/18/21  Vassie Loll, MD  atorvastatin (LIPITOR) 40 MG tablet Take 1 tablet (40 mg total) by mouth daily. 11/18/20 11/18/21  Vassie Loll, MD  clopidogrel (PLAVIX) 75 MG tablet Take 1 tablet (75 mg total) by mouth daily. 11/18/20 11/18/21  Vassie Loll, MD  diltiazem (CARDIZEM CD) 360 MG 24 hr capsule Take 1 capsule (360 mg total) by mouth daily. 11/18/20 11/18/21  Vassie Loll, MD  indomethacin (INDOCIN) 25 MG capsule Take 25 mg by mouth 4 (four) times daily as needed. 11/19/19   [provider]  levothyroxine (SYNTHROID) 75 MCG tablet  11/22/19   [provider]  losartan (COZAAR) 100 MG tablet  10/19/19   [provider]  metFORMIN (GLUCOPHAGE-XR)  500 MG 24 hr tablet  11/01/19   [provider]  metoprolol succinate (TOPROL-XL) 50 MG 24 hr tablet  12/10/19   [provider]  pantoprazole (PROTONIX) 40 MG tablet Take 1 tablet (40 mg total) by mouth daily. 11/19/20   Vassie Loll, MD    Allergies    Patient has no known allergies.  Review of Systems   Review of Systems  Constitutional:  Negative for chills and fever.  HENT:  Negative for ear pain and sore throat.   Eyes:  Negative for pain and visual disturbance.  Respiratory:  Negative for cough and shortness of breath.   Cardiovascular:  Negative for chest pain and palpitations.  Gastrointestinal:  Negative for abdominal pain and vomiting.  Genitourinary:  Positive  for dysuria and frequency. Negative for hematuria.  Musculoskeletal:  Negative for arthralgias and back pain.  Skin:  Negative for color change and rash.  Neurological:  Positive for weakness and numbness. Negative for dizziness, seizures, syncope, facial asymmetry, speech difficulty, light-headedness and headaches.  All other systems reviewed and are negative.  Physical Exam Updated Vital Signs BP (!) 145/108   Pulse 93   Temp 98.2 F (36.8 C) (Oral)   Resp 17   Ht 1.778 m ( )   Wt 80.3 kg   SpO2 94%   BMI 25.40 kg/m   Physical Exam Vitals and nursing note reviewed.  Constitutional:      Appearance: Normal appearance. He is well-developed.  HENT:     Head: Normocephalic and atraumatic.  Eyes:     Extraocular Movements: Extraocular movements intact.     Conjunctiva/sclera: Conjunctivae normal.     Pupils: Pupils are equal, round, and reactive to light.  Cardiovascular:     Rate and Rhythm: Normal rate and regular rhythm.     Heart sounds: No murmur heard. Pulmonary:     Effort: Pulmonary effort is normal. No respiratory distress.     Breath sounds: Normal breath sounds.  Abdominal:     Palpations: Abdomen is soft.     Tenderness: There is no abdominal tenderness.  Musculoskeletal:        General: No swelling or signs of injury.     Cervical back: Normal range of motion and neck supple. No rigidity.  Skin:    General: Skin is warm and dry.     Capillary Refill: Capillary refill takes less than 2 seconds.     Findings: Rash present.     Comments: Patient with some psoriasis patches predominantly on his left lower extremity.  Neurological:     Mental Status: He is alert and oriented to person, place, and time.     Cranial Nerves: No cranial nerve deficit.     Sensory: Sensory deficit present.     Motor: Weakness present.     Comments: Patient without any distinct motor weakness.  Maybe some slight strength in the right hand.  With good movement of the bilateral  lower extremities.  Little bit of abnormality with the coordination putting the heel on the left shin and then putting it down to the ankle.    ED Results / Procedures / Treatments   Labs (all labs ordered are listed, but only abnormal results are displayed) Labs Reviewed  CBC - Abnormal; Notable for the following components:      Result Value   WBC 13.5 (*)    RBC 4.20 (*)    All other components within normal limits  DIFFERENTIAL - Abnormal; Notable for  the following components:   Neutro Abs 11.5 (*)    Abs Immature Granulocytes 0.09 (*)    All other components within normal limits  COMPREHENSIVE METABOLIC PANEL - Abnormal; Notable for the following components:   Glucose, Bld 260 (*)    Total Protein 8.2 (*)    AST 12 (*)    Alkaline Phosphatase 161 (*)    All other components within normal limits  URINALYSIS, ROUTINE W REFLEX MICROSCOPIC - Abnormal; Notable for the following components:   APPearance CLOUDY (*)    Glucose, UA >=500 (*)    Hgb urine dipstick MODERATE (*)    Ketones, ur 20 (*)    Protein, ur 100 (*)    Leukocytes,Ua LARGE (*)    RBC / HPF >50 (*)    WBC, UA >50 (*)    Bacteria, UA FEW (*)    All other components within normal limits  I-STAT CHEM 8, ED - Abnormal; Notable for the following components:   Potassium 5.2 (*)    Glucose, Bld 258 (*)    All other components within normal limits  RESP PANEL BY RT-PCR (FLU A&B, COVID) ARPGX2  URINE CULTURE  ETHANOL  PROTIME-INR  APTT  RAPID URINE DRUG SCREEN, HOSP PERFORMED    EKG EKG Interpretation  Date/Time:  Monday January 18 2021 12:09:49 EDT Ventricular Rate:  102 PR Interval:  214 QRS Duration: 121 QT Interval:  344 QTC Calculation: 449 R Axis:   -16 Text Interpretation: Sinus or ectopic atrial tachycardia Borderline prolonged PR interval Right bundle branch block No significant change since last tracing Confirmed by Vanetta Mulders [phone]) on 01/18/2021 1:10:32 PM  Radiology CT HEAD WO  CONTRAST  Result Date: 01/18/2021 CLINICAL DATA:  Right-sided weakness. EXAM: CT HEAD WITHOUT CONTRAST TECHNIQUE: Contiguous axial images were obtained from the base of the skull through the vertex without intravenous contrast. COMPARISON:  Nov 17, 2020. FINDINGS: Brain: Mild chronic ischemic white matter disease is noted. Mild diffuse cortical atrophy is noted. No mass effect or midline shift is noted. Ventricular size is within normal limits. There is no evidence of mass lesion, hemorrhage or acute infarction. Vascular: No hyperdense vessel or unexpected calcification. Skull: Normal. Negative for fracture or focal lesion. Sinuses/Orbits: No acute finding. Other: None. IMPRESSION: No acute intracranial abnormality seen. Electronically Signed   By: Lupita Raider M.D.   On: 01/18/2021 13:59   MR ANGIO HEAD WO CONTRAST  Result Date: 01/18/2021 CLINICAL DATA:  Neuro deficit, acute stroke suspected. EXAM: MRI HEAD WITHOUT CONTRAST MRA HEAD WITHOUT CONTRAST TECHNIQUE: Multiplanar, multi-echo pulse sequences of the brain and surrounding structures were acquired without intravenous contrast. Angiographic images of the Circle of Willis were acquired using MRA technique without intravenous contrast. COMPARISON:  MRI/MRA Nov 17, 2020. FINDINGS: MRI HEAD FINDINGS Brain: Acute infarct in the posterolateral right medulla (see series 5, image 4). Associated mild edema without mass effect. Additional acute right PCA territory infarcts in the right occipital lobe and posterior right corpus callosum. Developing encephalomalacia in the areas of previously seen infarct in the inferior right cerebellum. Remote lacunar infarcts in the left thalamus and bilateral corona radiata and right caudate. Additional moderate scattered T2/FLAIR hyperintensities within the white matter, compatible with chronic microvascular ischemic disease. Vascular: See below. Skull and upper cervical spine: Normal marrow signal. Degenerative change of  the cervical spine, partially imaged. Sinuses/Orbits: Sinuses are largely clear.  Unremarkable orbits. Other: No sizable mastoid effusion. MRA HEAD FINDINGS Anterior circulation: Bilateral ICAs are patent. Severe stenosis  of the distal left M1 MCA. Severe stenosis of bilateral M2 MCA branches. Bilateral A1 ACAs are patent. Multifocal severe stenosis of the right A2 ACA and severe stenosis of the proximal left A2 ACA. Posterior circulation: Similar multifocal moderate right Peyton Najjar of the visualized intradural vertebral artery. The more proximal V3 vertebral artery is not imaged on this study. The proximal right PICA is patent with no flow related signal in the more distal PICA, similar to prior. The intradural left vertebral artery and the basilar artery are patent. Severe stenosis of bilateral P2 PCAs with poor flow related signal in the more distal PCAs. IMPRESSION: MRI: 1. Acute infarct in the posterolateral right medulla, likely right PICA territory. Additional acute right PCA territory infarcts in the right occipital lobe and posterior right corpus callosum. Associated mild edema without mass effect. 2. Developing encephalomalacia in the areas of previously seen infarct in the inferior right cerebellum. 3. Remote lacunar infarcts in the left thalamus, right caudate, and bilateral corona radiata. 4. Moderate chronic microvascular disease. MRA: 1. Severe stenosis of bilateral P2 PCAs with poor flow related signal in the more distal PCAs. Also, severe stenosis of the distal left M1 MCA, bilateral M2 MCA branches and bilateral A2 ACAs. 2. Similar multifocal moderate narrowing of the distal right intradural vertebral artery. Similar flow related signal in the proximal right PICA, but not the more distal right PICA. Electronically Signed   By: Feliberto Harts MD   On: 01/18/2021 16:24   MR Brain Wo Contrast (neuro protocol)  Result Date: 01/18/2021 CLINICAL DATA:  Neuro deficit, acute stroke suspected. EXAM: MRI  HEAD WITHOUT CONTRAST MRA HEAD WITHOUT CONTRAST TECHNIQUE: Multiplanar, multi-echo pulse sequences of the brain and surrounding structures were acquired without intravenous contrast. Angiographic images of the Circle of Willis were acquired using MRA technique without intravenous contrast. COMPARISON:  MRI/MRA Nov 17, 2020. FINDINGS: MRI HEAD FINDINGS Brain: Acute infarct in the posterolateral right medulla (see series 5, image 4). Associated mild edema without mass effect. Additional acute right PCA territory infarcts in the right occipital lobe and posterior right corpus callosum. Developing encephalomalacia in the areas of previously seen infarct in the inferior right cerebellum. Remote lacunar infarcts in the left thalamus and bilateral corona radiata and right caudate. Additional moderate scattered T2/FLAIR hyperintensities within the white matter, compatible with chronic microvascular ischemic disease. Vascular: See below. Skull and upper cervical spine: Normal marrow signal. Degenerative change of the cervical spine, partially imaged. Sinuses/Orbits: Sinuses are largely clear.  Unremarkable orbits. Other: No sizable mastoid effusion. MRA HEAD FINDINGS Anterior circulation: Bilateral ICAs are patent. Severe stenosis of the distal left M1 MCA. Severe stenosis of bilateral M2 MCA branches. Bilateral A1 ACAs are patent. Multifocal severe stenosis of the right A2 ACA and severe stenosis of the proximal left A2 ACA. Posterior circulation: Similar multifocal moderate right Peyton Najjar of the visualized intradural vertebral artery. The more proximal V3 vertebral artery is not imaged on this study. The proximal right PICA is patent with no flow related signal in the more distal PICA, similar to prior. The intradural left vertebral artery and the basilar artery are patent. Severe stenosis of bilateral P2 PCAs with poor flow related signal in the more distal PCAs. IMPRESSION: MRI: 1. Acute infarct in the posterolateral right  medulla, likely right PICA territory. Additional acute right PCA territory infarcts in the right occipital lobe and posterior right corpus callosum. Associated mild edema without mass effect. 2. Developing encephalomalacia in the areas of previously seen infarct in the  inferior right cerebellum. 3. Remote lacunar infarcts in the left thalamus, right caudate, and bilateral corona radiata. 4. Moderate chronic microvascular disease. MRA: 1. Severe stenosis of bilateral P2 PCAs with poor flow related signal in the more distal PCAs. Also, severe stenosis of the distal left M1 MCA, bilateral M2 MCA branches and bilateral A2 ACAs. 2. Similar multifocal moderate narrowing of the distal right intradural vertebral artery. Similar flow related signal in the proximal right PICA, but not the more distal right PICA. Electronically Signed   By: Feliberto Harts MD   On: 01/18/2021 16:24    Procedures Procedures   Medications Ordered in ED Medications  cefTRIAXone (ROCEPHIN) 1 g in sodium chloride 0.9 % 100 mL IVPB (1 g Intravenous New Bag/Given 01/18/21 1625)    ED Course  I have reviewed the triage vital signs and the nursing notes.  Pertinent labs & imaging results that were available during my care of the patient were reviewed by me and considered in my medical decision making (see chart for details).    MDM Rules/Calculators/A&P                         CRITICAL CARE Performed by: Vanetta Mulders Total critical care time: 45 minutes Critical care time was exclusive of separately billable procedures and treating other patients. Critical care was necessary to treat or prevent imminent or life-threatening deterioration. Critical care was time spent personally by me on the following activities: development of treatment plan with patient and/or surrogate as well as nursing, discussions with consultants, evaluation of patient's response to treatment, examination of patient, obtaining history from patient or  surrogate, ordering and performing treatments and interventions, ordering and review of laboratory studies, ordering and review of radiographic studies, pulse oximetry and re-evaluation of patient's condition.   Patient known to me when he was admitted the end of May for vertigo symptoms that ended up showing acute infarct.  This is concerning for recurrent infarct.  We will get a head CT and if without any significant findings we will proceed to MRI brain.  Patient blood sugar elevated here at 258 slight increase in potassium at 5.2.  But BUN and creatinine without significant abnormalities.  COVID testing negative.  Mild leukocytosis.  Also urinalysis suggestive of urinary tract infection.  Sent for culture.  Previous infection back in May was due to staph aureus.  We will start Rocephin here.  Complete metabolic panel here had glucose of 260 with a potassium of 4.9.  Urine was sent for culture.  MRI significant for acute infarct in the posterior lateral right mid [place] likely right PICA territory.  Additional acute right PCA territorial infarcts in the right occipital lobe and posterior right corpus callosum.  Associated mild edema without mass-effect.  Developing encephalomalacia in the areas of the previous seen infarct in the inferior right cerebellum.  Also remote lacunar infarcts in the left thalamus right caudate and bilateral corona radiata.  MRA shows evidence of severe stenosis.  We will discussed with the neurologist.  Patient will require admission.  Question is can patient be admitted here or there is a need to be admitted to [facility]. Final Clinical Impression(s) / ED Diagnoses Final diagnoses:  Cerebrovascular accident (CVA), unspecified mechanism (HCC)  Acute cystitis without hematuria    Rx / DC Orders ED Discharge Orders     None        Vanetta Mulders, MD 01/18/21 1712    Vanetta Mulders, MD  01/18/21 1713  

## 2021-01-18 NOTE — ED Notes (Signed)
Pt. Educated to not drink the coke and eat the potato chips provided by pts. Brother.

## 2021-01-18 NOTE — H&P (Addendum)
History and Physical    Jason Cox ZOX:[id number] DOB: [birth year] DOA: 01/18/2021  PCP: Elfredia Nevins, MD   Patient coming from: Home  I have personally briefly reviewed patient's old medical records in [facility]  Chief Complaint: Right sided weakness  HPI: Jason Cox is a 75 y.o. male with medical history significant for hypertension, diabetes, recent acute CVA. Presented to ED with complaints of right-sided weakness involving his right lower extremity.  He reports numbness involving his right upper extremity.  No facial asymmetry no change in vision no difficulty swallowing no abnormal sensation involving any other parts of his body.  Patient reports 2 falls today due to weakness in his right lower extremity. Patient reports he has been compliant with aspirin daily, but is not familiar with the name Plavix/clopidogrel and does not think he has been taking it, does not think he has been taking a cholesterol medication either.  Hospitalization 5/24 - 5/25-presented with dizziness, head CT showed acute infarct in the right inferior cerebellum consistent with right PICA territory stroke.  Patient completed stroke work-up to include MRA head, carotid Dopplers and echocardiogram.  Neurology was consulted and plan was for patient to be discharge to complete 1 month of dual antiplatelets and then continue with aspirin daily.   Also diagnosed with urinary tract infection- cultures grew staph aureus was treated with Rocephin and discharged on Keflex.  ED Course: Blood pressure systolic 130s to 811B, UA suggestive of urinary tract infection.  Was negative for acute intracranial abnormality, subsequent MRI brain showed acute infarct in the posterior lateral right medulla right PICA territory additional acute right PCA territory infarct in the right occipital lobe and posterior right corpus callosum, associated mild edema without mass-effect. EDP talked to neurologist Dr. Clyda Hurdle, patient  okay for admission here, seen by in patient Neurology here).  Review of Systems: As per HPI all other systems reviewed and negative.  Past Medical History:  Diagnosis Date   Hypertension    Benign Essential   Hypothyroidism    Obesity    Osteoarthritis    PVD (peripheral vascular disease) (HCC)    Type 2 diabetes mellitus (HCC)     Past Surgical History:  Procedure Laterality Date   APPENDECTOMY     TONSILLECTOMY       reports that he has quit smoking. He has never used smokeless tobacco. He reports previous alcohol use. He reports that he does not use drugs.  No Known Allergies  Family History  Problem Relation Age of Onset   Osteoporosis Mother    Hypertension Mother    Cancer Mother    Heart attack Father    Heart attack Maternal Grandmother    Pneumonia Maternal Grandfather    Heart disease Paternal Grandmother    Heart disease Paternal Grandfather    Prior to Admission medications   Medication Sig Start Date End Date Taking? Authorizing Provider  Ascorbic Acid (VITAMIN C) 100 MG tablet Take 100 mg by mouth daily.   Yes [provider]  aspirin EC 81 MG tablet Take 1 tablet (81 mg total) by mouth daily. Swallow whole. 11/18/20 11/18/21 Yes Vassie Loll, MD  Cholecalciferol (VITAMIN D3) 10 MCG (400 UNIT) CAPS Take 1 capsule by mouth daily.   Yes [provider]  diltiazem (CARDIZEM CD) 240 MG 24 hr capsule Take 240 mg by mouth daily. 01/14/21  Yes [provider]  indomethacin (INDOCIN) 25 MG capsule Take 25 mg by mouth 4 (four) times daily as needed. 11/19/19  Yes [provider]  levothyroxine (SYNTHROID) 75 MCG tablet Take 75 mcg by mouth daily before breakfast. 11/22/19  Yes [provider]  losartan (COZAAR) 100 MG tablet Take 100 mg by mouth daily. 10/19/19  Yes [provider]  metFORMIN (GLUCOPHAGE-XR) 500 MG 24 hr tablet Take 1,000 mg by mouth in the morning and at bedtime. 11/01/19  Yes [provider]   Omega-3 Fatty Acids (FISH OIL PO) Take 2,500 mg by mouth in the morning and at bedtime.   Yes [provider]  amLODipine (NORVASC) 10 MG tablet Take 10 mg by mouth daily. Patient not taking: Reported on 01/18/2021 12/10/19   [provider]  atorvastatin (LIPITOR) 40 MG tablet Take 1 tablet (40 mg total) by mouth daily. Patient not taking: No sig reported 11/18/20 11/18/21  Vassie Loll, MD  clopidogrel (PLAVIX) 75 MG tablet Take 1 tablet (75 mg total) by mouth daily. Patient not taking: No sig reported 11/18/20 11/18/21  Vassie Loll, MD  diltiazem (CARDIZEM CD) 360 MG 24 hr capsule Take 1 capsule (360 mg total) by mouth daily. Patient not taking: No sig reported 11/18/20 11/18/21  Vassie Loll, MD  metoprolol succinate (TOPROL-XL) 50 MG 24 hr tablet  12/10/19   [provider]  pantoprazole (PROTONIX) 40 MG tablet Take 1 tablet (40 mg total) by mouth daily. Patient not taking: Reported on 01/18/2021 11/19/20   Vassie Loll, MD    Physical Exam: Vitals:   01/18/21 1515 01/18/21 1615 01/18/21 1630 01/18/21 1730  BP:  (!) 146/88 (!) 145/108 139/80  Pulse:  97 93 97  Resp: Temp:      TempSrc:      SpO2:  96% 94% 91%  Weight:      Height:        Constitutional: NAD, calm, comfortable Vitals:   01/18/21 1515 01/18/21 1615 01/18/21 1630 01/18/21 1730  BP:  (!) 146/88 (!) 145/108 139/80  Pulse:  97 93 97  Resp: Temp:      TempSrc:      SpO2:  96% 94% 91%  Weight:      Height:       Eyes: PERRL, lids and conjunctivae normal ENMT: Mucous membranes are dry.  Neck: normal, supple, no masses, no thyromegaly Respiratory: clear to auscultation bilaterally, no wheezing, no crackles. Normal respiratory effort. No accessory muscle use.  Cardiovascular: Regular rate and rhythm, no murmurs / rubs / gallops. No extremity edema. 2+ pedal pulses.  Abdomen: no tenderness, no masses palpated. No hepatosplenomegaly. Bowel sounds positive.   Musculoskeletal: no clubbing / cyanosis. No joint deformity upper and lower extremities. Good ROM, no contractures. Normal muscle tone.  Skin: no rashes, lesions, ulcers. No induration Neurologic:  Neurological:     Mental Status: he is alert.     GCS: GCS eye subscore is 4. GCS verbal subscore is 5. GCS motor subscore is 6.     Comments: Mental Status:  Alert, oriented, thought content appropriate, able to give a coherent history. Speech fluent without evidence of aphasia. Able to follow 2 step commands without difficulty.  Cranial Nerves:  II:  Peripheral visual fields grossly normal, pupils equal, round, reactive to light III,IV, VI: ptosis not present, extra-ocular motions intact bilaterally  V,VII: smile symmetric, eyebrows raise symmetric, facial light touch sensation equal VIII: hearing grossly normal to voice  X: uvula elevates symmetrically  XI: bilateral shoulder shrug symmetric and strong XII: midline tongue extension without  fassiculations Motor:  Normal tone.  5/5 strength bilateral upper extremity, no appreciable weakness in right lower extremity.  5/5 strength left lower extremity. Sensory: Sensation intact to light touch in all extremities.  Cerebellar:  CV: distal pulses palpable throughout  .  Psychiatric: Normal judgment and insight. Alert and oriented x 3. Normal mood.   Labs on Admission: I have personally reviewed following labs and imaging studies  CBC: Recent Labs  Lab 01/18/21 1217 01/18/21 1245  WBC 13.5*  --   NEUTROABS 11.5*  --   HGB 13.2 13.6  HCT 40.5 40.0  MCV 96.4  --   PLT 377  --    Basic Metabolic Panel: Recent Labs  Lab 01/18/21 1217 01/18/21 1245  NA 137 140  K 4.9 5.2*  CL 106 107  CO2 23  --   GLUCOSE 260* 258*  BUN 22 23  CREATININE 1.19 1.10  CALCIUM 9.6  --    Liver Function Tests: Recent Labs  Lab 01/18/21 1217  AST 12*  ALT 16  ALKPHOS 161*  BILITOT 0.5  PROT 8.2*  ALBUMIN 3.6   Coagulation Profile: Recent  Labs  Lab 01/18/21 1217  INR 1.0   Urine analysis:    Component Value Date/Time   COLORURINE YELLOW 01/18/2021 1202   APPEARANCEUR CLOUDY (A) 01/18/2021 1202   LABSPEC 1.013 01/18/2021 1202   PHURINE 7.0 01/18/2021 1202   GLUCOSEU >=500 (A) 01/18/2021 1202   HGBUR MODERATE (A) 01/18/2021 1202   BILIRUBINUR NEGATIVE 01/18/2021 1202   KETONESUR 20 (A) 01/18/2021 1202   PROTEINUR 100 (A) 01/18/2021 1202   UROBILINOGEN 0.2 05/22/2007 0025   NITRITE NEGATIVE 01/18/2021 1202   LEUKOCYTESUR LARGE (A) 01/18/2021 1202    Radiological Exams on Admission: CT HEAD WO CONTRAST  Result Date: 01/18/2021 CLINICAL DATA:  Right-sided weakness. EXAM: CT HEAD WITHOUT CONTRAST TECHNIQUE: Contiguous axial images were obtained from the base of the skull through the vertex without intravenous contrast. COMPARISON:  Nov 17, 2020. FINDINGS: Brain: Mild chronic ischemic white matter disease is noted. Mild diffuse cortical atrophy is noted. No mass effect or midline shift is noted. Ventricular size is within normal limits. There is no evidence of mass lesion, hemorrhage or acute infarction. Vascular: No hyperdense vessel or unexpected calcification. Skull: Normal. Negative for fracture or focal lesion. Sinuses/Orbits: No acute finding. Other: None. IMPRESSION: No acute intracranial abnormality seen. Electronically Signed   By: Lupita Raider M.D.   On: 01/18/2021 13:59   MR ANGIO HEAD WO CONTRAST  Result Date: 01/18/2021 CLINICAL DATA:  Neuro deficit, acute stroke suspected. EXAM: MRI HEAD WITHOUT CONTRAST MRA HEAD WITHOUT CONTRAST TECHNIQUE: Multiplanar, multi-echo pulse sequences of the brain and surrounding structures were acquired without intravenous contrast. Angiographic images of the Circle of Willis were acquired using MRA technique without intravenous contrast. COMPARISON:  MRI/MRA Nov 17, 2020. FINDINGS: MRI HEAD FINDINGS Brain: Acute infarct in the posterolateral right medulla (see series 5, image 4).  Associated mild edema without mass effect. Additional acute right PCA territory infarcts in the right occipital lobe and posterior right corpus callosum. Developing encephalomalacia in the areas of previously seen infarct in the inferior right cerebellum. Remote lacunar infarcts in the left thalamus and bilateral corona radiata and right caudate. Additional moderate scattered T2/FLAIR hyperintensities within the white matter, compatible with chronic microvascular ischemic disease. Vascular: See below. Skull and upper cervical spine: Normal marrow signal. Degenerative change of the cervical spine, partially imaged. Sinuses/Orbits: Sinuses are largely clear.  Unremarkable orbits. Other: No  sizable mastoid effusion. MRA HEAD FINDINGS Anterior circulation: Bilateral ICAs are patent. Severe stenosis of the distal left M1 MCA. Severe stenosis of bilateral M2 MCA branches. Bilateral A1 ACAs are patent. Multifocal severe stenosis of the right A2 ACA and severe stenosis of the proximal left A2 ACA. Posterior circulation: Similar multifocal moderate right Peyton Najjar of the visualized intradural vertebral artery. The more proximal V3 vertebral artery is not imaged on this study. The proximal right PICA is patent with no flow related signal in the more distal PICA, similar to prior. The intradural left vertebral artery and the basilar artery are patent. Severe stenosis of bilateral P2 PCAs with poor flow related signal in the more distal PCAs. IMPRESSION: MRI: 1. Acute infarct in the posterolateral right medulla, likely right PICA territory. Additional acute right PCA territory infarcts in the right occipital lobe and posterior right corpus callosum. Associated mild edema without mass effect. 2. Developing encephalomalacia in the areas of previously seen infarct in the inferior right cerebellum. 3. Remote lacunar infarcts in the left thalamus, right caudate, and bilateral corona radiata. 4. Moderate chronic microvascular disease.  MRA: 1. Severe stenosis of bilateral P2 PCAs with poor flow related signal in the more distal PCAs. Also, severe stenosis of the distal left M1 MCA, bilateral M2 MCA branches and bilateral A2 ACAs. 2. Similar multifocal moderate narrowing of the distal right intradural vertebral artery. Similar flow related signal in the proximal right PICA, but not the more distal right PICA. Electronically Signed   By: Feliberto Harts MD   On: 01/18/2021 16:24   MR Brain Wo Contrast (neuro protocol)  Result Date: 01/18/2021 CLINICAL DATA:  Neuro deficit, acute stroke suspected. EXAM: MRI HEAD WITHOUT CONTRAST MRA HEAD WITHOUT CONTRAST TECHNIQUE: Multiplanar, multi-echo pulse sequences of the brain and surrounding structures were acquired without intravenous contrast. Angiographic images of the Circle of Willis were acquired using MRA technique without intravenous contrast. COMPARISON:  MRI/MRA Nov 17, 2020. FINDINGS: MRI HEAD FINDINGS Brain: Acute infarct in the posterolateral right medulla (see series 5, image 4). Associated mild edema without mass effect. Additional acute right PCA territory infarcts in the right occipital lobe and posterior right corpus callosum. Developing encephalomalacia in the areas of previously seen infarct in the inferior right cerebellum. Remote lacunar infarcts in the left thalamus and bilateral corona radiata and right caudate. Additional moderate scattered T2/FLAIR hyperintensities within the white matter, compatible with chronic microvascular ischemic disease. Vascular: See below. Skull and upper cervical spine: Normal marrow signal. Degenerative change of the cervical spine, partially imaged. Sinuses/Orbits: Sinuses are largely clear.  Unremarkable orbits. Other: No sizable mastoid effusion. MRA HEAD FINDINGS Anterior circulation: Bilateral ICAs are patent. Severe stenosis of the distal left M1 MCA. Severe stenosis of bilateral M2 MCA branches. Bilateral A1 ACAs are patent. Multifocal severe  stenosis of the right A2 ACA and severe stenosis of the proximal left A2 ACA. Posterior circulation: Similar multifocal moderate right Peyton Najjar of the visualized intradural vertebral artery. The more proximal V3 vertebral artery is not imaged on this study. The proximal right PICA is patent with no flow related signal in the more distal PICA, similar to prior. The intradural left vertebral artery and the basilar artery are patent. Severe stenosis of bilateral P2 PCAs with poor flow related signal in the more distal PCAs. IMPRESSION: MRI: 1. Acute infarct in the posterolateral right medulla, likely right PICA territory. Additional acute right PCA territory infarcts in the right occipital lobe and posterior right corpus callosum. Associated mild edema without  mass effect. 2. Developing encephalomalacia in the areas of previously seen infarct in the inferior right cerebellum. 3. Remote lacunar infarcts in the left thalamus, right caudate, and bilateral corona radiata. 4. Moderate chronic microvascular disease. MRA: 1. Severe stenosis of bilateral P2 PCAs with poor flow related signal in the more distal PCAs. Also, severe stenosis of the distal left M1 MCA, bilateral M2 MCA branches and bilateral A2 ACAs. 2. Similar multifocal moderate narrowing of the distal right intradural vertebral artery. Similar flow related signal in the proximal right PICA, but not the more distal right PICA. Electronically Signed   By: Feliberto Harts MD   On: 01/18/2021 16:24    EKG: Independently reviewed.  Sinus rhythm or ectopic atrial tachycardia rate 102, QTc 449, PR prolonged at interval 214 (first-degree AV block not new).  Assessment/Plan Principal Problem:   Acute stroke due to ischemia [facility]) Active Problems:   Essential hypertension   Diabetes mellitus type 2 in nonobese (HCC)   Acute lower UTI  Acute CVA-presents with right upper extremity paresthesias and right lower extremity weakness.  Recent hospitalization 5/24-also  with acute CVA.  EKG shows regular rhythm, sinus versus ectopic atrial tachycardia. -Outside therapeutic window, LKN 9pm last night - EDP talked to Dr. Iver Nestle neurologist., ok to admit here. -Reports compliance with aspirin, but did not take Plavix for 1 month or statins.   - MRI brain shows acute infarct in the posterolateral right medulla, likely right PICA territory. Additional acute right PCA territory infarcts in the right occipital lobe and posterior right corpus callosum. Associated mild edema without mass effect. -Neurology consultation in the morning -Limited echocardiogram ordered as patient recently completed full stroke work-up - ?  If TEE needed at this time considering MRI findings -Start aspirin and Plavix -PT/OT evaluation -Start atorvastatin 40 mg daily -Allow for permissive hypertension -N.p.o. midnight for possible TEE  UTI - UA suggestive with large leukocytes greater than 50 WBCs.  Recent hospitalization for same, cultures grew pansensitive staph aureus. -Continue IV ceftriaxone 1 g daily -Follow up urine cultures - ??  why staph UTI, if repeat urine cultures positive again for staph  Hypertension-stable. -Hold Cardizem, losartan, allow for permissive hypertension  Uncontrolled diabetes mellitus random glucose 260. HgbA1c 8.6. - SSI-m -Hold home metformin   DVT prophylaxis: SCDS for now Code Status: Full code confirmed with patient at bedside. Family Communication: Patient's brother at bedside. Disposition Plan: ~ 2 days Consults called: Neurology Admission status: Inpatient, telemetry I certify that at the point of admission it is my clinical judgment that the patient will require inpatient hospital care spanning beyond 2 midnights from the point of admission due to high intensity of service, high risk for further deterioration and high frequency of surveillance required.    Onnie Boer MD [facility]  01/18/2021, 9:09 PM

## 2021-01-18 NOTE — ED Notes (Signed)
Attempted report x1. 

## 2021-01-19 ENCOUNTER — Encounter (HOSPITAL_COMMUNITY): Payer: Self-pay | Admitting: Internal Medicine

## 2021-01-19 ENCOUNTER — Inpatient Hospital Stay (HOSPITAL_COMMUNITY): Payer: Medicare Other

## 2021-01-19 DIAGNOSIS — E039 Hypothyroidism, unspecified: Secondary | ICD-10-CM

## 2021-01-19 DIAGNOSIS — E119 Type 2 diabetes mellitus without complications: Secondary | ICD-10-CM

## 2021-01-19 DIAGNOSIS — I6389 Other cerebral infarction: Secondary | ICD-10-CM | POA: Diagnosis not present

## 2021-01-19 DIAGNOSIS — I10 Essential (primary) hypertension: Secondary | ICD-10-CM

## 2021-01-19 DIAGNOSIS — N3000 Acute cystitis without hematuria: Secondary | ICD-10-CM

## 2021-01-19 DIAGNOSIS — E785 Hyperlipidemia, unspecified: Secondary | ICD-10-CM

## 2021-01-19 LAB — ECHOCARDIOGRAM COMPLETE
AR max vel: 2.54 cm2
AV Area VTI: 2.62 cm2
AV Area mean vel: 2.5 cm2
AV Mean grad: 3.1 mmHg
AV Peak grad: 5.4 mmHg
Ao pk vel: 1.16 m/s
Area-P 1/2: 6.96 cm2
Height: 71 in
S' Lateral: 2.14 cm
Weight: 2768 oz

## 2021-01-19 LAB — GLUCOSE, CAPILLARY
Glucose-Capillary: 233 mg/dL — ABNORMAL HIGH (ref 70–99)
Glucose-Capillary: 200 mg/dL — ABNORMAL HIGH (ref 70–99)
Glucose-Capillary: 155 mg/dL — ABNORMAL HIGH (ref 70–99)
Glucose-Capillary: 188 mg/dL — ABNORMAL HIGH (ref 70–99)

## 2021-01-19 NOTE — Progress Notes (Signed)
PROGRESS NOTE    Jason Cox  HYW:[id number] DOB: 08/18/1945 DOA: 01/18/2021 PCP: Elfredia Nevins, MD    Chief Complaint  Patient presents with   Weakness    Brief admission narrative:  As per H&P written by Dr. Mariea Clonts on 01/18/2021 Jason Cox is a 75 y.o. male with medical history significant for hypertension, diabetes, recent acute CVA. Presented to ED with complaints of right-sided weakness involving his right lower extremity.  He reports numbness involving his right upper extremity.  No facial asymmetry no change in vision no difficulty swallowing no abnormal sensation involving any other parts of his body.  Patient reports 2 falls today due to weakness in his right lower extremity. Patient reports he has been compliant with aspirin daily, but is not familiar with the name Plavix/clopidogrel and does not think he has been taking it, does not think he has been taking a cholesterol medication either.   Hospitalization 5/24 - 5/25-presented with dizziness, head CT showed acute infarct in the right inferior cerebellum consistent with right PICA territory stroke.  Patient completed stroke work-up to include MRA head, carotid Dopplers and echocardiogram.  Neurology was consulted and plan was for patient to be discharge to complete 1 month of dual antiplatelets and then continue with aspirin daily.   Also diagnosed with urinary tract infection- cultures grew staph aureus was treated with Rocephin and discharged on Keflex.    Assessment & Plan: 1-Acute stroke due to ischemia (HCC) -2D echo not demonstrating significant abnormalities -Neurology evaluation and further recommendations pending at this time -Given multiple infarcts and locations affected on MRI he might likely require a TEE -Continue aspirin and Plavix -Continue risk factor modification.  2-Essential hypertension -Stable currently -Allowing permissive hypertension. -Follow vital signs.  3-Diabetes mellitus type 2 in  nonobese (HCC) -Continue the use of a sliding scale insulin -A1c 8.6 -Holding oral hypoglycemic agents while inpatient.  4-Acute lower UTI -Complaining of dysuria -Follow urine cultures -Continue IV Rocephin.  5-hyperlipidemia -Continue statins.  6-hypothyroidism -Continue Synthroid.  7-physical deconditioning -With ongoing poor balance and right-sided weakness -Appreciate evaluation by PT -Skilled nursing facility has been recommended at this point.     DVT prophylaxis: SCD's Code Status: Full code Family Communication: No family at bedside. Disposition:   Status is: Inpatient  Remains inpatient appropriate because:IV treatments appropriate due to intensity of illness or inability to take PO  Dispo: The patient is from: Home              Anticipated d/c is to: SNF              Patient currently is not medically stable to d/c.   Difficult to place patient No       Consultants:  Neurology service  Procedures:  See below for x-ray reports 2D echo: Ejection fraction 70 to 75%; mild hypertrophy no significant valvular abnormalities.  No wall motion abnormalities.  Antimicrobials:  Rocephin   Subjective: Following commands appropriately; no fever, no chest pain, no nausea or vomiting.  Reporting poor balance, right-sided weakness and being extremely hungry.  Patient reports dysuria while urinating.  Objective: Vitals:   01/19/21 0059 01/19/21 0317 01/19/21 0502 01/19/21 0700  BP: (!) 159/76 (!) 142/80 (!) 168/80 (!) 175/88  Pulse: 96 (!) 49 95 76  Resp: 18 18 18 18   Temp: 98.3 F (36.8 C) 98.2 F (36.8 C) 98 F (36.7 C) 98 F (36.7 C)  TempSrc:    Oral  SpO2: 97% 100% 98% 100%  Weight:  Height:        Intake/Output Summary (Last 24 hours) at 01/19/2021 1615 Last data filed at 01/19/2021 1300 Gross per 24 hour  Intake 100 ml  Output 300 ml  Net -200 ml   Filed Weights   01/18/21 0927 01/18/21 2059  Weight: 80.3 kg 78.5 kg     Examination:  General exam: Appears calm and comfortable; no chest pain, no palpitations, no nausea or vomiting.  Reports being significantly hungry. Respiratory system: Clear to auscultation. Respiratory effort normal. Cardiovascular system: S1 & S2 heard, RRR. No JVD, murmurs, rubs, gallops or clicks. No pedal edema. Gastrointestinal system: Abdomen is nondistended, soft and nontender. No organomegaly or masses felt. Normal bowel sounds heard. Central nervous system: Alert and oriented.  Experiencing poor balance and right-sided weakness. Extremities: No cyanosis or clubbing. Skin: No petechiae. Psychiatry: Judgement and insight appear normal. Mood & affect appropriate.     Data Reviewed: I have personally reviewed following labs and imaging studies  CBC: Recent Labs  Lab 01/18/21 1217 01/18/21 1245  WBC 13.5*  --   NEUTROABS 11.5*  --   HGB 13.2 13.6  HCT 40.5 40.0  MCV 96.4  --   PLT 377  --     Basic Metabolic Panel: Recent Labs  Lab 01/18/21 1217 01/18/21 1245  NA 137 140  K 4.9 5.2*  CL 106 107  CO2 23  --   GLUCOSE 260* 258*  BUN 22 23  CREATININE 1.19 1.10  CALCIUM 9.6  --     GFR: Estimated Creatinine Clearance: 59.9 mL/min (by C-G formula based on SCr of 1.1 mg/dL).  Liver Function Tests: Recent Labs  Lab 01/18/21 1217  AST 12*  ALT 16  ALKPHOS 161*  BILITOT 0.5  PROT 8.2*  ALBUMIN 3.6    CBG: Recent Labs  Lab 01/18/21 2115 01/19/21 0741 01/19/21 1154  GLUCAP 233* 233* 200*    Recent Results (from the past 240 hour(s))  Resp Panel by RT-PCR (Flu A&B, Covid) Nasopharyngeal Swab     Status: None   Collection Time: 01/18/21 11:56 AM   Specimen: Nasopharyngeal Swab; Nasopharyngeal(NP) swabs in vial transport medium  Result Value Ref Range Status   SARS Coronavirus 2 by RT PCR NEGATIVE NEGATIVE Final    Comment: (NOTE) SARS-CoV-2 target nucleic acids are NOT DETECTED.  The SARS-CoV-2 RNA is generally detectable in upper  respiratory specimens during the acute phase of infection. The lowest concentration of SARS-CoV-2 viral copies this assay can detect is 138 copies/mL. A negative result does not preclude SARS-Cov-2 infection and should not be used as the sole basis for treatment or other patient management decisions. A negative result may occur with  improper specimen collection/handling, submission of specimen other than nasopharyngeal swab, presence of viral mutation(s) within the areas targeted by this assay, and inadequate number of viral copies(<138 copies/mL). A negative result must be combined with clinical observations, patient history, and epidemiological information. The expected result is Negative.  Fact Sheet for Patients:  BloggerCourse.com  Fact Sheet for Healthcare Providers:  SeriousBroker.it  This test is no t yet approved or cleared by the [place] FDA and  has been authorized for detection and/or diagnosis of SARS-CoV-2 by FDA under an Emergency Use Authorization (EUA). This EUA will remain  in effect (meaning this test can be used) for the duration of the COVID-19 declaration under Section 564(b)(1) of the Act, 21 U.S.C.section 360bbb-3(b)(1), unless the authorization is terminated  or revoked sooner.  Influenza A by PCR NEGATIVE NEGATIVE Final   Influenza B by PCR NEGATIVE NEGATIVE Final    Comment: (NOTE) The Xpert Xpress SARS-CoV-2/FLU/RSV plus assay is intended as an aid in the diagnosis of influenza from Nasopharyngeal swab specimens and should not be used as a sole basis for treatment. Nasal washings and aspirates are unacceptable for Xpert Xpress SARS-CoV-2/FLU/RSV testing.  Fact Sheet for Patients: BloggerCourse.com  Fact Sheet for Healthcare Providers: SeriousBroker.it  This test is not yet approved or cleared by the [place] FDA and has been  authorized for detection and/or diagnosis of SARS-CoV-2 by FDA under an Emergency Use Authorization (EUA). This EUA will remain in effect (meaning this test can be used) for the duration of the COVID-19 declaration under Section 564(b)(1) of the Act, 21 U.S.C. section 360bbb-3(b)(1), unless the authorization is terminated or revoked.  Performed at [facility], [address], [place], [place] [address]      Radiology Studies: CT HEAD WO CONTRAST  Result Date: 01/18/2021 CLINICAL DATA:  Right-sided weakness. EXAM: CT HEAD WITHOUT CONTRAST TECHNIQUE: Contiguous axial images were obtained from the base of the skull through the vertex without intravenous contrast. COMPARISON:  Nov 17, 2020. FINDINGS: Brain: Mild chronic ischemic white matter disease is noted. Mild diffuse cortical atrophy is noted. No mass effect or midline shift is noted. Ventricular size is within normal limits. There is no evidence of mass lesion, hemorrhage or acute infarction. Vascular: No hyperdense vessel or unexpected calcification. Skull: Normal. Negative for fracture or focal lesion. Sinuses/Orbits: No acute finding. Other: None. IMPRESSION: No acute intracranial abnormality seen. Electronically Signed   By: Lupita Raider M.D.   On: 01/18/2021 13:59   MR ANGIO HEAD WO CONTRAST  Result Date: 01/18/2021 CLINICAL DATA:  Neuro deficit, acute stroke suspected. EXAM: MRI HEAD WITHOUT CONTRAST MRA HEAD WITHOUT CONTRAST TECHNIQUE: Multiplanar, multi-echo pulse sequences of the brain and surrounding structures were acquired without intravenous contrast. Angiographic images of the Circle of Willis were acquired using MRA technique without intravenous contrast. COMPARISON:  MRI/MRA Nov 17, 2020. FINDINGS: MRI HEAD FINDINGS Brain: Acute infarct in the posterolateral right medulla (see series 5, image 4). Associated mild edema without mass effect. Additional acute right PCA territory infarcts in the right occipital lobe and posterior  right corpus callosum. Developing encephalomalacia in the areas of previously seen infarct in the inferior right cerebellum. Remote lacunar infarcts in the left thalamus and bilateral corona radiata and right caudate. Additional moderate scattered T2/FLAIR hyperintensities within the white matter, compatible with chronic microvascular ischemic disease. Vascular: See below. Skull and upper cervical spine: Normal marrow signal. Degenerative change of the cervical spine, partially imaged. Sinuses/Orbits: Sinuses are largely clear.  Unremarkable orbits. Other: No sizable mastoid effusion. MRA HEAD FINDINGS Anterior circulation: Bilateral ICAs are patent. Severe stenosis of the distal left M1 MCA. Severe stenosis of bilateral M2 MCA branches. Bilateral A1 ACAs are patent. Multifocal severe stenosis of the right A2 ACA and severe stenosis of the proximal left A2 ACA. Posterior circulation: Similar multifocal moderate right Peyton Najjar of the visualized intradural vertebral artery. The more proximal V3 vertebral artery is not imaged on this study. The proximal right PICA is patent with no flow related signal in the more distal PICA, similar to prior. The intradural left vertebral artery and the basilar artery are patent. Severe stenosis of bilateral P2 PCAs with poor flow related signal in the more distal PCAs. IMPRESSION: MRI: 1. Acute infarct in the posterolateral right medulla, likely right PICA territory. Additional  acute right PCA territory infarcts in the right occipital lobe and posterior right corpus callosum. Associated mild edema without mass effect. 2. Developing encephalomalacia in the areas of previously seen infarct in the inferior right cerebellum. 3. Remote lacunar infarcts in the left thalamus, right caudate, and bilateral corona radiata. 4. Moderate chronic microvascular disease. MRA: 1. Severe stenosis of bilateral P2 PCAs with poor flow related signal in the more distal PCAs. Also, severe stenosis of the  distal left M1 MCA, bilateral M2 MCA branches and bilateral A2 ACAs. 2. Similar multifocal moderate narrowing of the distal right intradural vertebral artery. Similar flow related signal in the proximal right PICA, but not the more distal right PICA. Electronically Signed   By: Feliberto Harts MD   On: 01/18/2021 16:24   MR Brain Wo Contrast (neuro protocol)  Result Date: 01/18/2021 CLINICAL DATA:  Neuro deficit, acute stroke suspected. EXAM: MRI HEAD WITHOUT CONTRAST MRA HEAD WITHOUT CONTRAST TECHNIQUE: Multiplanar, multi-echo pulse sequences of the brain and surrounding structures were acquired without intravenous contrast. Angiographic images of the Circle of Willis were acquired using MRA technique without intravenous contrast. COMPARISON:  MRI/MRA Nov 17, 2020. FINDINGS: MRI HEAD FINDINGS Brain: Acute infarct in the posterolateral right medulla (see series 5, image 4). Associated mild edema without mass effect. Additional acute right PCA territory infarcts in the right occipital lobe and posterior right corpus callosum. Developing encephalomalacia in the areas of previously seen infarct in the inferior right cerebellum. Remote lacunar infarcts in the left thalamus and bilateral corona radiata and right caudate. Additional moderate scattered T2/FLAIR hyperintensities within the white matter, compatible with chronic microvascular ischemic disease. Vascular: See below. Skull and upper cervical spine: Normal marrow signal. Degenerative change of the cervical spine, partially imaged. Sinuses/Orbits: Sinuses are largely clear.  Unremarkable orbits. Other: No sizable mastoid effusion. MRA HEAD FINDINGS Anterior circulation: Bilateral ICAs are patent. Severe stenosis of the distal left M1 MCA. Severe stenosis of bilateral M2 MCA branches. Bilateral A1 ACAs are patent. Multifocal severe stenosis of the right A2 ACA and severe stenosis of the proximal left A2 ACA. Posterior circulation: Similar multifocal moderate  right Peyton Najjar of the visualized intradural vertebral artery. The more proximal V3 vertebral artery is not imaged on this study. The proximal right PICA is patent with no flow related signal in the more distal PICA, similar to prior. The intradural left vertebral artery and the basilar artery are patent. Severe stenosis of bilateral P2 PCAs with poor flow related signal in the more distal PCAs. IMPRESSION: MRI: 1. Acute infarct in the posterolateral right medulla, likely right PICA territory. Additional acute right PCA territory infarcts in the right occipital lobe and posterior right corpus callosum. Associated mild edema without mass effect. 2. Developing encephalomalacia in the areas of previously seen infarct in the inferior right cerebellum. 3. Remote lacunar infarcts in the left thalamus, right caudate, and bilateral corona radiata. 4. Moderate chronic microvascular disease. MRA: 1. Severe stenosis of bilateral P2 PCAs with poor flow related signal in the more distal PCAs. Also, severe stenosis of the distal left M1 MCA, bilateral M2 MCA branches and bilateral A2 ACAs. 2. Similar multifocal moderate narrowing of the distal right intradural vertebral artery. Similar flow related signal in the proximal right PICA, but not the more distal right PICA. Electronically Signed   By: Feliberto Harts MD   On: 01/18/2021 16:24   ECHOCARDIOGRAM COMPLETE  Result Date: 01/19/2021    ECHOCARDIOGRAM REPORT   Patient Name:   DONTRAY HABERLAND Date  of Exam: 01/19/2021 Medical Rec #:  [id number]       Height:       71.0 in Accession #:    [id number]      Weight:       173.0 lb Date of Birth:  [birth year]        BSA:          1.983 m Patient Age:    75 years        BP:           175/88 mmHg Patient Gender: M               HR:           76 bpm. [facility] Procedure: 2D Echo, Cardiac Doppler and Color Doppler Indications:    CVA  History:        Patient has prior history of Echocardiogram examinations, most                  recent 11/18/2020. CAD, Prior CABG, Stroke and PVD; Risk                 Factors:Hypertension and Diabetes.  Sonographer:    Lavenia Atlas RDCS Referring Phys: [phone] Hel[facility] IMPRESSIONS  1. Left ventricular ejection fraction, by estimation, is 70 to 75%. The left ventricle has hyperdynamic function. The left ventricle has no regional wall motion abnormalities. There is mild left ventricular hypertrophy. Left ventricular diastolic parameters are indeterminate.  2. Right ventricular systolic function is normal. The right ventricular size is normal. Tricuspid regurgitation signal is inadequate for assessing PA pressure.  3. The mitral valve is normal in structure. No evidence of mitral valve regurgitation. No evidence of mitral stenosis.  4. The aortic valve is tricuspid. Aortic valve regurgitation is not visualized. No aortic stenosis is present. FINDINGS  Left Ventricle: Left ventricular ejection fraction, by estimation, is 70 to 75%. The left ventricle has hyperdynamic function. The left ventricle has no regional wall motion abnormalities. The left ventricular internal cavity size was normal in size. There is mild left ventricular hypertrophy. Left ventricular diastolic parameters are indeterminate. Right Ventricle: The right ventricular size is normal. No increase in right ventricular wall thickness. Right ventricular systolic function is normal. Tricuspid regurgitation signal is inadequate for assessing PA pressure. Left Atrium: Left atrial size was normal in size. Right Atrium: Right atrial size was normal in size. Pericardium: There is no evidence of pericardial effusion. Mitral Valve: The mitral valve is normal in structure. No evidence of mitral valve regurgitation. No evidence of mitral valve stenosis. Tricuspid Valve: The tricuspid valve is normal in structure. Tricuspid valve regurgitation is not demonstrated. No evidence of tricuspid stenosis. Aortic Valve: The aortic valve is tricuspid.  Aortic valve regurgitation is not visualized. No aortic stenosis is present. Aortic valve mean gradient measures 3.1 mmHg. Aortic valve peak gradient measures 5.4 mmHg. Aortic valve area, by VTI measures 2.62 cm. Pulmonic Valve: The pulmonic valve was not well visualized. Pulmonic valve regurgitation is not visualized. No evidence of pulmonic stenosis. Aorta: The aortic root is normal in size and structure. IAS/Shunts: The interatrial septum was not well visualized.  LEFT VENTRICLE PLAX 2D LVIDd:         3.97 cm  Diastology LVIDs:         2.14 cm  LV e' medial:    5.34 cm/s LV PW:         1.28 cm  LV E/e' medial:  12.7 LV IVS:        1.16 cm  LV e' lateral:   9.37 cm/s LVOT diam:     2.10 cm  LV E/e' lateral: 7.2 LV SV:         58 LV SV Index:   29 LVOT Area:     3.46 cm  RIGHT VENTRICLE RV Basal diam:  3.05 cm RV S prime:     7.80 cm/s TAPSE (M-mode): 1.5 cm LEFT ATRIUM             Index       RIGHT ATRIUM           Index LA diam:        3.40 cm 1.71 cm/m  RA Area:     14.40 cm LA Vol (A2C):   36.8 ml 18.56 ml/m RA Volume:   35.00 ml  17.65 ml/m LA Vol (A4C):   54.4 ml 27.44 ml/m LA Biplane Vol: 47.3 ml 23.86 ml/m  AORTIC VALVE AV Area (Vmax):    2.54 cm AV Area (Vmean):   2.50 cm AV Area (VTI):     2.62 cm AV Vmax:           116.19 cm/s AV Vmean:          80.913 cm/s AV VTI:            0.222 m AV Peak Grad:      5.4 mmHg AV Mean Grad:      3.1 mmHg LVOT Vmax:         85.20 cm/s LVOT Vmean:        58.400 cm/s LVOT VTI:          0.168 m LVOT/AV VTI ratio: 0.76  AORTA Ao Root diam: 3.60 cm MITRAL VALVE MV Area (PHT): 6.96 cm     SHUNTS MV Decel Time: 109 msec     Systemic VTI:  0.17 m MV E velocity: 67.70 cm/s   Systemic Diam: 2.10 cm MV A velocity: 108.00 cm/s MV E/A ratio:  0.63 Dina Rich MD Electronically signed by Dina Rich MD Signature Date/Time: 01/19/2021/9:34:14 AM    Final      Scheduled Meds:  aspirin EC  81 mg Oral Daily   atorvastatin  40 mg Oral Daily   insulin aspart  0-15  Units Subcutaneous TID WC   insulin aspart  0-5 Units Subcutaneous QHS   levothyroxine  75 mcg Oral QAC breakfast   Continuous Infusions:  cefTRIAXone (ROCEPHIN)  IV 1 g (01/19/21 1438)     LOS: 1 day    Time spent: 35 minutes   Vassie Loll, MD [facility]   To contact the attending provider between 7A-7P or the covering provider during after hours 7P-7A, please log into the web site [email or web address] and access using universal [place] password for that web site. If you do not have the password, please call the hospital operator.  01/19/2021, 4:15 PM

## 2021-01-19 NOTE — Evaluation (Signed)
Physical Therapy Evaluation Patient Details Name: Jason Cox MRN: [id number] DOB: 12-26-1945 Today's Date: 01/19/2021   History of Present Illness  Jason Cox is a 75 y.o. male with medical history significant for hypertension, diabetes, recent acute CVA.  Presented to ED with complaints of right-sided weakness involving his right lower extremity.  He reports numbness involving his right upper extremity.  No facial asymmetry no change in vision no difficulty swallowing no abnormal sensation involving any other parts of his body.  Patient reports 2 falls today due to weakness in his right lower extremity.  Patient reports he has been compliant with aspirin daily, but is not familiar with the name Plavix/clopidogrel and does not think he has been taking it, does not think he has been taking a cholesterol medication either.   Clinical Impression  Patient unable to use cane for taking steps due to poor standing balance with near fall, had to use RW, but very unsteady and limited to a few side steps before having to sit due to right sided weakness and c/o fatigue.  Patient tolerated sitting up in chair after therapy - nursing staff aware.  Patient will benefit from continued physical therapy in hospital and recommended venue below to increase strength, balance, endurance for safe ADLs and gait.      Follow Up Recommendations SNF    Equipment Recommendations  Rolling walker with 5" wheels    Recommendations for Other Services       Precautions / Restrictions Precautions Precautions: Fall Restrictions Weight Bearing Restrictions: No      Mobility  Bed Mobility Overal bed mobility: Needs Assistance Bed Mobility: Supine to Sit     Supine to sit: Supervision     General bed mobility comments: increased time, labored movement    Transfers Overall transfer level: Needs assistance Equipment used: Rolling walker (2 wheeled);Straight cane Transfers: Sit to/from [employer] Sit to Stand: Mod assist Stand pivot transfers: Mod assist       General transfer comment: very unsteady on feet and unable to maintain standing balance using SPC, required use of RW for safety  Ambulation/Gait Ambulation/Gait assistance: Mod assist;Max assist Gait Distance (Feet): 4 Feet Assistive device: Rolling walker (2 wheeled) Gait Pattern/deviations: Decreased step length - left;Decreased stance time - right;Decreased stride length;Antalgic;Shuffle Gait velocity: decreased   General Gait Details: limited to 4-5 slow labored unsteady steps at bedside with mostly dragging of right foot due to weakness  Stairs            Wheelchair Mobility    Modified Rankin (Stroke Patients Only)       Balance Overall balance assessment: Needs assistance Sitting-balance support: Feet supported;No upper extremity supported Sitting balance-Leahy Scale: Good Sitting balance - Comments: at EOB   Standing balance support: During functional activity;Single extremity supported Standing balance-Leahy Scale: Poor Standing balance comment: fair/poor using RW, poor using SPC                             Pertinent Vitals/Pain Pain Assessment: No/denies pain    Home Living Family/patient expects to be discharged to:: Private residence Living Arrangements: Other relatives Available Help at Discharge: Family Type of Home: House Home Access: Stairs to enter Entrance Stairs-Rails: Left Entrance Stairs-Number of Steps: 4 Home Layout: One level Home Equipment: Cane - single point;Grab bars - tub/shower;Shower seat - built in Additional Comments: Lives with brother, but pt reports having to take care of brother in some  area. Brother does go to the grocery store for pt.    Prior Function Level of Independence: Independent with assistive device(s)         Comments: household and community ambulator with use of cane PRN. Independent for ADL's and IADL's, but pt's  brother does the grocery shopping.     Hand Dominance   Dominant Hand: Right    Extremity/Trunk Assessment   Upper Extremity Assessment Upper Extremity Assessment: Defer to OT evaluation    Lower Extremity Assessment Lower Extremity Assessment: RLE deficits/detail RLE Deficits / Details: grossly -3/5 RLE Sensation: decreased light touch RLE Coordination: decreased fine motor;decreased gross motor    Cervical / Trunk Assessment Cervical / Trunk Assessment: Kyphotic  Communication   Communication: No difficulties  Cognition Arousal/Alertness: Awake/alert Behavior During Therapy: WFL for tasks assessed/performed Overall Cognitive Status: Within Functional Limits for tasks assessed                                        General Comments      Exercises     Assessment/Plan    PT Assessment Patient needs continued PT services  PT Problem List Decreased strength;Decreased activity tolerance;Decreased balance;Decreased mobility       PT Treatment Interventions DME instruction;Gait training;Stair training;Functional mobility training;Therapeutic activities;Therapeutic exercise;Balance training;Patient/family education    PT Goals (Current goals can be found in the Care Plan section)  Acute Rehab PT Goals Patient Stated Goal: return home after rehab PT Goal Formulation: With patient Time For Goal Achievement: 02/01/21 Potential to Achieve Goals: Good    Frequency Min 4X/week   Barriers to discharge        Co-evaluation PT/OT/SLP Co-Evaluation/Treatment: Yes Reason for Co-Treatment: To address functional/ADL transfers PT goals addressed during session: Mobility/safety with mobility;Balance;Proper use of DME OT goals addressed during session: ADL's and self-care;Strengthening/ROM       AM-PAC PT "6 Clicks" Mobility  Outcome Measure Help needed turning from your back to your side while in a flat bed without using bedrails?: None Help needed  moving from lying on your back to sitting on the side of a flat bed without using bedrails?: A Little Help needed moving to and from a bed to a chair (including a wheelchair)?: A Lot Help needed standing up from a chair using your arms (e.g., wheelchair or bedside chair)?: A Lot Help needed to walk in hospital room?: A Lot Help needed climbing 3-5 steps with a railing? : Total 6 Click Score: 14    End of Session   Activity Tolerance: Patient tolerated treatment well;Patient limited by fatigue Patient left: in chair;with call bell/phone within reach;with chair alarm set Nurse Communication: Mobility status PT Visit Diagnosis: Unsteadiness on feet (R26.81);Other abnormalities of gait and mobility (R26.89);Muscle weakness (generalized) (M62.81)    Time: 0076-2263 PT Time Calculation (min) (ACUTE ONLY): 21 min   Charges:   PT Evaluation $PT Eval Moderate Complexity: 1 Mod PT Treatments $Therapeutic Activity: 8-22 mins        11:24 AM, 01/19/21 Ocie Bob, MPT Physical Therapist with [facility] [phone] office [phone] mobile phone

## 2021-01-19 NOTE — TOC Initial Note (Signed)
Transition of Care [facility]) - Initial/Assessment Note    Patient Details  Name: Jason Cox MRN: [id number] Date of Birth: 21-May-1946  Transition of Care [facility]) CM/SW Contact:    Leitha Bleak, RN Phone Number: 01/19/2021, 2:13 PM  Clinical Narrative:     Patient admitted with Acute Stroke. Patient lives with his brother and uses a cane. PT is recommending SNF. Patient wants to go home with home health. TOC to follow to refer out.               Expected Discharge Plan: Home w Home Health Services Barriers to Discharge: Continued Medical Work up  Patient Goals and CMS Choice Patient states their goals for this hospitalization and ongoing recovery are:: to go home. CMS Medicare.gov Compare Post Acute Care list provided to:: Patient Choice offered to / list presented to : Patient  Expected Discharge Plan and Services Expected Discharge Plan: Home w Home Health Services      Prior Living Arrangements/Services   Lives with:: Siblings    Current home services: DME   Activities of Daily Living Home Assistive Devices/Equipment: Bedside commode/3-in-1, Grab bars in shower ADL Screening (condition at time of admission) Patient's cognitive ability adequate to safely complete daily activities?: Yes Is the patient deaf or have difficulty hearing?: No Does the patient have difficulty seeing, even when wearing glasses/contacts?: Yes Does the patient have difficulty concentrating, remembering, or making decisions?: No Patient able to express need for assistance with ADLs?: Yes Does the patient have difficulty dressing or bathing?: No Independently performs ADLs?: Yes (appropriate for developmental age) Does the patient have difficulty walking or climbing stairs?: Yes Weakness of Legs: Right Weakness of Arms/Hands: None  Permission Sought/Granted   Emotional Assessment    Affect (typically observed): Accepting Orientation: : Oriented to Self, Oriented to Place, Oriented to  Time,  Oriented to Situation Alcohol / Substance Use: Not Applicable Psych Involvement: No (comment)  Admission diagnosis:  Acute cystitis without hematuria [N30.00] Acute CVA (cerebrovascular accident) [facility]) [I63.9] Cerebrovascular accident (CVA), unspecified mechanism (HCC) [I63.9] Patient Active Problem List   Diagnosis Date Noted   Acute cystitis without hematuria    Hyperlipidemia    Acute stroke due to ischemia (HCC) 11/17/2020   Essential hypertension 11/17/2020   Diabetes mellitus type 2 in nonobese (HCC) 11/17/2020   Acute lower UTI 11/17/2020   Mild protein-calorie malnutrition (HCC) 11/17/2020   Thyroid disease 11/17/2020   PCP:  Elfredia Nevins, MD Pharmacy:   [facility] [place], [place] - [phone] [place] [address] Phone: [phone] Fax: [phone]  Readmission Risk Interventions Readmission Risk Prevention Plan 01/19/2021  Medication Screening Complete  Transportation Screening Complete  Some recent data might be hidden

## 2021-01-19 NOTE — Plan of Care (Signed)
  Problem: Education: Goal: Knowledge of General Education information will improve Description Including pain rating scale, medication(s)/side effects and non-pharmacologic comfort measures Outcome: Progressing   Problem: Health Behavior/Discharge Planning: Goal: Ability to manage health-related needs will improve Outcome: Progressing   

## 2021-01-19 NOTE — Plan of Care (Signed)
  Problem: Acute Rehab OT Goals (only OT should resolve) Goal: Pt. Will Perform Grooming Flowsheets (Taken 01/19/2021 0956) Pt Will Perform Grooming:  with min assist  standing  with adaptive equipment  with min guard assist Goal: Pt. Will Perform Lower Body Dressing Flowsheets (Taken 01/19/2021 0956) Pt Will Perform Lower Body Dressing:  Independently  with adaptive equipment  sit to/from stand  sitting/lateral leans Goal: Pt. Will Transfer To Toilet Flowsheets (Taken 01/19/2021 [phone]) Pt Will Transfer to Toilet:  with min assist  stand pivot transfer Goal: Pt/Caregiver Will Perform Home Exercise Program Flowsheets (Taken 01/19/2021 [phone]) Pt/caregiver will Perform Home Exercise Program:  Increased strength  Both right and left upper extremity  Independently  Almena Hokenson OT, MOT

## 2021-01-19 NOTE — Progress Notes (Signed)
  Echocardiogram 2D Echocardiogram has been performed.  Jason Cox 01/19/2021, 9:18 AM

## 2021-01-19 NOTE — Evaluation (Signed)
Occupational Therapy Evaluation Patient Details Name: Jason Cox MRN: [id number] DOB: 1946-03-17 Today's Date: 01/19/2021    History of Present Illness Jason Cox is a 75 y.o. male with medical history significant for hypertension, diabetes, recent acute CVA.  Presented to ED with complaints of right-sided weakness involving his right lower extremity.  He reports numbness involving his right upper extremity.  No facial asymmetry no change in vision no difficulty swallowing no abnormal sensation involving any other parts of his body.  Patient reports 2 falls today due to weakness in his right lower extremity.  Patient reports he has been compliant with aspirin daily, but is not familiar with the name Plavix/clopidogrel and does not think he has been taking it, does not think he has been taking a cholesterol medication either.   Clinical Impression   Pt agreeable to OT/PT co-evaluation. Pt demonstrates general UE weakness with mild difficulty with visual skill of saccades. Pt unsteady on feet with Mod A for sit to stand and stand pivot transfer with verbal cues for safety awareness to reach back in chair and push up from bed. Pt able to don socks at EOB with extended time. Pt will benefit from continued OT in the the hospital and recommended venue below to increase strength, balance, and endurance for safe ADL's.     Follow Up Recommendations  SNF    Equipment Recommendations  None recommended by OT           Precautions / Restrictions Precautions Precautions: Fall Restrictions Weight Bearing Restrictions: No      Mobility Bed Mobility Overal bed mobility: Needs Assistance Bed Mobility: Supine to Sit     Supine to sit: Supervision          Transfers Overall transfer level: Needs assistance Equipment used: Rolling walker (2 wheeled) Transfers: Sit to/from [employer] Sit to Stand: Mod assist Stand pivot transfers: Mod assist       General  transfer comment: using RW; pt reports fear of falling.    Balance Overall balance assessment: Needs assistance Sitting-balance support: Feet supported;No upper extremity supported Sitting balance-Leahy Scale: Good Sitting balance - Comments: at EOB   Standing balance support: Bilateral upper extremity supported;During functional activity Standing balance-Leahy Scale: Poor Standing balance comment: using RW                           ADL either performed or assessed with clinical judgement   ADL Overall ADL's : Needs assistance/impaired                     Lower Body Dressing: Modified independent;Sitting/lateral leans Lower Body Dressing Details (indicate cue type and reason): donning socks seated at EOB Toilet Transfer: Moderate assistance;RW;Stand-pivot Toilet Transfer Details (indicate cue type and reason): simulated via EOB to chair with RW                 Vision Baseline Vision/History: No visual deficits Patient Visual Report: No change from baseline Vision Assessment?: Yes Ocular Range of Motion: Within Functional Limits Alignment/Gaze Preference: Within Defined Limits Tracking/Visual Pursuits: Able to track stimulus in all quads without difficulty Saccades: Other (comment) (Some difficulty following instructions and maintaining gaze until directed to switch to the new stimulus.) Convergence: Within functional limits Visual Fields: No apparent deficits                Pertinent Vitals/Pain Pain Assessment: No/denies pain     Hand Dominance Right  Extremity/Trunk Assessment Upper Extremity Assessment Upper Extremity Assessment: Generalized weakness (4/5 MMT grossly with the exception of 4-/5 for R elbow extension. Arthritis in B hands but WFL fine motor skills.)   Lower Extremity Assessment Lower Extremity Assessment: Defer to PT evaluation   Cervical / Trunk Assessment Cervical / Trunk Assessment: Kyphotic   Communication  Communication Communication: No difficulties   Cognition Arousal/Alertness: Awake/alert Behavior During Therapy: WFL for tasks assessed/performed Overall Cognitive Status: Within Functional Limits for tasks assessed                                                      Home Living Family/patient expects to be discharged to:: Private residence Living Arrangements: Other relatives (Brother) Available Help at Discharge: Family Type of Home: House Home Access: Stairs to enter Secretary/administrator of Steps: 4 Entrance Stairs-Rails: Left Home Layout: One level     Bathroom Shower/Tub: Producer, television/film/video: Standard Bathroom Accessibility: Yes   Home Equipment: Cane - single point;Grab bars - tub/shower;Shower seat - built in   Additional Comments: Lives with brother, but pt reports having to take care of brother in some area. Brother does go to the grocery store for pt.      Prior Functioning/Environment Level of Independence: Independent with assistive device(s)        Comments: household and community ambulator with use of cane PRN. Independent for ADL's and IADL's, but pt's brother does the grocery shopping.        OT Problem List: Decreased strength;Decreased activity tolerance;Impaired balance (sitting and/or standing);Decreased coordination;Decreased safety awareness      OT Treatment/Interventions: Self-care/ADL training;Therapeutic exercise;DME and/or AE instruction;Therapeutic activities;Neuromuscular education;Patient/family education;Visual/perceptual remediation/compensation;Balance training    OT Goals(Current goals can be found in the care plan section) Acute Rehab OT Goals Patient Stated Goal: Pt unsure about returning home at this time. OT Goal Formulation: With patient Time For Goal Achievement: 02/02/21 Potential to Achieve Goals: Good  OT Frequency: Min 2X/week               Co-evaluation PT/OT/SLP  Co-Evaluation/Treatment: Yes Reason for Co-Treatment: To address functional/ADL transfers   OT goals addressed during session: ADL's and self-care;Strengthening/ROM                       End of Session Equipment Utilized During Treatment: Rolling walker;Gait belt  Activity Tolerance: Patient tolerated treatment well Patient left: in chair;with chair alarm set;with call bell/phone within reach  OT Visit Diagnosis: Unsteadiness on feet (R26.81);Repeated falls (R29.6);History of falling (Z91.81);Muscle weakness (generalized) (M62.81);Other symptoms and signs involving cognitive function                Time: 8413-2440 OT Time Calculation (min): 20 min Charges:  OT General Charges $OT Visit: 1 Visit OT Evaluation $OT Eval Low Complexity: 1 Low  Jason Cox OT, MOT  Jason Cox 01/19/2021, 9:53 AM

## 2021-01-19 NOTE — Plan of Care (Signed)
  Problem: Acute Rehab PT Goals(only PT should resolve) Goal: Pt Will Go Supine/Side To Sit Outcome: Progressing Flowsheets (Taken 01/19/2021 1128) Pt will go Supine/Side to Sit: with modified independence Goal: Patient Will Transfer Sit To/From Stand Outcome: Progressing Flowsheets (Taken 01/19/2021 1128) Patient will transfer sit to/from stand: with minimal assist Goal: Pt Will Transfer Bed To Chair/Chair To Bed Outcome: Progressing Flowsheets (Taken 01/19/2021 1128) Pt will Transfer Bed to Chair/Chair to Bed: with min assist Goal: Pt Will Ambulate Outcome: Progressing Flowsheets (Taken 01/19/2021 1128) Pt will Ambulate:  50 feet  with minimal assist  with moderate assist  with rolling walker   11:29 AM, 01/19/21 Ocie Bob, MPT Physical Therapist with [facility] [phone] office [phone] mobile phone

## 2021-01-19 NOTE — Progress Notes (Signed)
  Echocardiogram 2D Echocardiogram has been performed.  Pieter Partridge 01/19/2021, 9:17 AM

## 2021-01-20 DIAGNOSIS — I69952 Hemiplegia and hemiparesis following unspecified cerebrovascular disease affecting left dominant side: Secondary | ICD-10-CM | POA: Diagnosis not present

## 2021-01-20 DIAGNOSIS — R471 Dysarthria and anarthria: Secondary | ICD-10-CM | POA: Diagnosis not present

## 2021-01-20 LAB — BASIC METABOLIC PANEL
Anion gap: 10 (ref 5–15)
BUN: 26 mg/dL — ABNORMAL HIGH (ref 8–23)
CO2: 23 mmol/L (ref 22–32)
Calcium: 9.1 mg/dL (ref 8.9–10.3)
Chloride: 104 mmol/L (ref 98–111)
Creatinine, Ser: 1.22 mg/dL (ref 0.61–1.24)
GFR, Estimated: >60 mL/min (ref >60)
Glucose, Bld: 207 mg/dL — ABNORMAL HIGH (ref 70–99)
Potassium: 5.1 mmol/L (ref 3.5–5.1)
Sodium: 137 mmol/L (ref 135–145)

## 2021-01-20 LAB — CBC
HCT: 39 % (ref 39.0–52.0)
Hemoglobin: 12.8 g/dL — ABNORMAL LOW (ref 13.0–17.0)
MCH: 31.7 pg (ref 26.0–34.0)
MCHC: 32.8 g/dL (ref 30.0–36.0)
MCV: 96.5 fL (ref 80.0–100.0)
Platelets: 333 10*3/uL (ref 150–400)
RBC: 4.04 MIL/uL — ABNORMAL LOW (ref 4.22–5.81)
RDW: 14.1 % (ref 11.5–15.5)
WBC: 11.6 10*3/uL — ABNORMAL HIGH (ref 4.0–10.5)
nRBC: 0 % (ref 0.0–0.2)

## 2021-01-20 LAB — LIPID PANEL
Cholesterol: 217 mg/dL — ABNORMAL HIGH (ref 0–200)
HDL: 37 mg/dL — ABNORMAL LOW (ref >40)
LDL Cholesterol: 150 mg/dL — ABNORMAL HIGH (ref 0–99)
Total CHOL/HDL Ratio: 5.9 RATIO
Triglycerides: 151 mg/dL — ABNORMAL HIGH (ref <150)
VLDL: 30 mg/dL (ref 0–40)

## 2021-01-20 LAB — GLUCOSE, CAPILLARY
Glucose-Capillary: 169 mg/dL — ABNORMAL HIGH (ref 70–99)
Glucose-Capillary: 179 mg/dL — ABNORMAL HIGH (ref 70–99)
Glucose-Capillary: 151 mg/dL — ABNORMAL HIGH (ref 70–99)
Glucose-Capillary: 178 mg/dL — ABNORMAL HIGH (ref 70–99)
Glucose-Capillary: 161 mg/dL — ABNORMAL HIGH (ref 70–99)

## 2021-01-20 MED ORDER — CEFAZOLIN SODIUM-DEXTROSE 2-4 GM/100ML-% IV SOLN
2.0000 g | Freq: Three times a day (TID) | INTRAVENOUS | Status: DC
  Administered 2021-01-21 – 2021-01-22 (×4): 2 g via INTRAVENOUS
  Filled 2021-01-20 (×4): qty 100

## 2021-01-20 MED ORDER — CLOPIDOGREL BISULFATE 75 MG PO TABS
75.0000 mg | ORAL_TABLET | Freq: Every day | ORAL | Status: DC
  Administered 2021-01-20 – 2021-01-22 (×3): 75 mg via ORAL
  Filled 2021-01-20 (×2): qty 1

## 2021-01-20 NOTE — Progress Notes (Signed)
PROGRESS NOTE  Jamarea Selner WUJ:[id number] DOB: 03-06-1946 DOA: 01/18/2021 PCP: Elfredia Nevins, MD  Brief History:  As per H&P written by Dr. Mariea Clonts on 01/18/2021 Cormick Moss is a 75 y.o. male with medical history significant for hypertension, diabetes, recent acute CVA. Presented to ED with complaints of right-sided weakness involving his right lower extremity.  He reports numbness involving his right upper extremity.  No facial asymmetry no change in vision no difficulty swallowing no abnormal sensation involving any other parts of his body.  Patient reports 2 falls today due to weakness in his right lower extremity. Patient reports he has been compliant with aspirin daily, but is not familiar with the name Plavix/clopidogrel and does not think he has been taking it, does not think he has been taking a cholesterol medication either.   Hospitalization 5/24 - 5/25-presented with dizziness, head CT showed acute infarct in the right inferior cerebellum consistent with right PICA territory stroke.  Patient completed stroke work-up to include MRA head, carotid Dopplers and echocardiogram.  Neurology was consulted and plan was for patient to be discharge to complete 1 month of dual antiplatelets and then continue with aspirin daily.   Also diagnosed with urinary tract infection- cultures grew staph aureus was treated with Rocephin and discharged on Keflex.    Assessment/Plan: Acute stroke due to ischemia [facility]) -2D echo not demonstrating significant abnormalities -Neurology consult  -Given multiple infarcts and locations affected on MRI he might likely require a TEE -Continue aspirin and Plavix--pt states he was only took ASA after his May 2022 discharge -Continue risk factor modification. -MR brain--acute infarct posterior R-medulla -MRA brain--no LVO;  severe stenosis bilateral PCAs -11/18/20 carotid US--no hemodynamically significant stenosis   Essential hypertension -Stable  currently -Allowing permissive hypertension. -holding metoprolol succinate, cardizem and losartan   Diabetes mellitus type 2 in nonobese (HCC) -Continue the use of a sliding scale insulin -A1c 8.6 -Holding oral hypoglycemic agents while inpatient.   Acute lower UTI -Complaining of dysuria -Follow urine cultures>>prelim = staph aureus -change ceftriaxone to cefazolin   hyperlipidemia -Continue statins. -LDL 150 -increase lipitor to 80 mg   hypothyroidism -Continue Synthroid.   Physical deconditioning -With ongoing poor balance and right-sided weakness -Appreciate evaluation by PT>>SNF -Skilled nursing facility has been recommended at this point. -pt initially refused SNF, now agrees        Status is: Inpatient  Remains inpatient appropriate because:IV treatments appropriate due to intensity of illness or inability to take PO  Dispo: The patient is from: Home              Anticipated d/c is to: SNF              Patient currently is not medically stable to d/c.   Difficult to place patient No        Family Communication:   brother updated at bedside 7/27  Consultants:  neurology  Code Status:  FULL   DVT Prophylaxis:  [place] Lovenox   Procedures: As Listed in Progress Note Above  Antibiotics: Cefazolin 7/27>> -ceftriaxone 7/25>>7/27      Subjective: States right leg weakness about same.  Patient denies fevers, chills, headache, chest pain, dyspnea, nausea, vomiting, diarrhea, abdominal pain, dysuria, hematuria, hematochezia, and melena.   Objective: Vitals:   01/19/21 0700 01/19/21 1952 01/20/21 0343 01/20/21 1043  BP: (!) 175/88 116/67 122/69 134/76  Pulse: 76 92 92 96  Resp: Temp: 98  F (36.7 C) 98 F (36.7 C) 98.1 F (36.7 C) 98.1 F (36.7 C)  TempSrc: Oral   Oral  SpO2: 100% 96% 96% 98%  Weight:      Height:        Intake/Output Summary (Last 24 hours) at 01/20/2021 1844 Last data filed at 01/20/2021 1700 Gross per 24  hour  Intake 720 ml  Output --  Net 720 ml   Weight change:  Exam:  General:  Pt is alert, follows commands appropriately, not in acute distress HEENT: No icterus, No thrush, No neck mass, [place]/AT Cardiovascular: RRR, S1/S2, no rubs, no gallops Respiratory: bibasilar rales. No wheeze Abdomen: Soft/+BS, non tender, non distended, no guarding Extremities: No edema, No lymphangitis, No petechiae, No rashes, no synovitis   Data Reviewed: I have personally reviewed following labs and imaging studies Basic Metabolic Panel: Recent Labs  Lab 01/18/21 1217 01/18/21 1245 01/20/21 0705  NA 137 140 137  K 4.9 5.2* 5.1  CL 106 107 104  CO2 23  --  23  GLUCOSE 260* 258* 207*  BUN 22 23 26*  CREATININE 1.19 1.10 1.22  CALCIUM 9.6  --  9.1   Liver Function Tests: Recent Labs  Lab 01/18/21 1217  AST 12*  ALT 16  ALKPHOS 161*  BILITOT 0.5  PROT 8.2*  ALBUMIN 3.6   No results for input(s): LIPASE, AMYLASE in the last 168 hours. No results for input(s): AMMONIA in the last 168 hours. Coagulation Profile: Recent Labs  Lab 01/18/21 1217  INR 1.0   CBC: Recent Labs  Lab 01/18/21 1217 01/18/21 1245 01/20/21 0705  WBC 13.5*  --  11.6*  NEUTROABS 11.5*  --   --   HGB 13.2 13.6 12.8*  HCT 40.5 40.0 39.0  MCV 96.4  --  96.5  PLT 377  --  333   Cardiac Enzymes: No results for input(s): CKTOTAL, CKMB, CKMBINDEX, TROPONINI in the last 168 hours. BNP: Invalid input(s): POCBNP CBG: Recent Labs  Lab 01/19/21 1632 01/19/21 1954 01/20/21 0732 01/20/21 1133 01/20/21 1646  GLUCAP 155* 188* 169* 179* 151*   HbA1C: No results for input(s): HGBA1C in the last 72 hours. Urine analysis:    Component Value Date/Time   COLORURINE YELLOW 01/18/2021 1202   APPEARANCEUR CLOUDY (A) 01/18/2021 1202   LABSPEC 1.013 01/18/2021 1202   PHURINE 7.0 01/18/2021 1202   GLUCOSEU >=500 (A) 01/18/2021 1202   HGBUR MODERATE (A) 01/18/2021 1202   BILIRUBINUR NEGATIVE 01/18/2021 1202    KETONESUR 20 (A) 01/18/2021 1202   PROTEINUR 100 (A) 01/18/2021 1202   UROBILINOGEN 0.2 05/22/2007 0025   NITRITE NEGATIVE 01/18/2021 1202   LEUKOCYTESUR LARGE (A) 01/18/2021 1202   Sepsis Labs: (procalcitonin:4,lacticidven:4) ) Recent Results (from the past 240 hour(s))  Resp Panel by RT-PCR (Flu A&B, Covid) Nasopharyngeal Swab     Status: None   Collection Time: 01/18/21 11:56 AM   Specimen: Nasopharyngeal Swab; Nasopharyngeal(NP) swabs in vial transport medium  Result Value Ref Range Status   SARS Coronavirus 2 by RT PCR NEGATIVE NEGATIVE Final    Comment: (NOTE) SARS-CoV-2 target nucleic acids are NOT DETECTED.  The SARS-CoV-2 RNA is generally detectable in upper respiratory specimens during the acute phase of infection. The lowest concentration of SARS-CoV-2 viral copies this assay can detect is 138 copies/mL. A negative result does not preclude SARS-Cov-2 infection and should not be used as the sole basis for treatment or other patient management decisions. A negative result may occur with  improper specimen  collection/handling, submission of specimen other than nasopharyngeal swab, presence of viral mutation(s) within the areas targeted by this assay, and inadequate number of viral copies(<138 copies/mL). A negative result must be combined with clinical observations, patient history, and epidemiological information. The expected result is Negative.  Fact Sheet for Patients:  BloggerCourse.com  Fact Sheet for Healthcare Providers:  SeriousBroker.it  This test is no t yet approved or cleared by the [place] FDA and  has been authorized for detection and/or diagnosis of SARS-CoV-2 by FDA under an Emergency Use Authorization (EUA). This EUA will remain  in effect (meaning this test can be used) for the duration of the COVID-19 declaration under Section 564(b)(1) of the Act, 21 U.S.C.section 360bbb-3(b)(1),  unless the authorization is terminated  or revoked sooner.       Influenza A by PCR NEGATIVE NEGATIVE Final   Influenza B by PCR NEGATIVE NEGATIVE Final    Comment: (NOTE) The Xpert Xpress SARS-CoV-2/FLU/RSV plus assay is intended as an aid in the diagnosis of influenza from Nasopharyngeal swab specimens and should not be used as a sole basis for treatment. Nasal washings and aspirates are unacceptable for Xpert Xpress SARS-CoV-2/FLU/RSV testing.  Fact Sheet for Patients: BloggerCourse.com  Fact Sheet for Healthcare Providers: SeriousBroker.it  This test is not yet approved or cleared by the [place] FDA and has been authorized for detection and/or diagnosis of SARS-CoV-2 by FDA under an Emergency Use Authorization (EUA). This EUA will remain in effect (meaning this test can be used) for the duration of the COVID-19 declaration under Section 564(b)(1) of the Act, 21 U.S.C. section 360bbb-3(b)(1), unless the authorization is terminated or revoked.  Performed at [facility], [address], [place], [place] [address]   Urine Culture     Status: Abnormal (Preliminary result)   Collection Time: 01/18/21  1:54 PM   Specimen: Urine, Clean Catch  Result Value Ref Range Status   Specimen Description   Final    URINE, CLEAN CATCH Performed at [facility], [address] [place], [place] [address]    Special Requests   Final    NONE Performed at [facility], [address] [place], [place] [address]    Culture (A)  Final    >=100,000 COLONIES/mL STAPHYLOCOCCUS AUREUS SUSCEPTIBILITIES TO FOLLOW Performed at [facility], [address], [place], [place] [address]    Report Status PENDING  Incomplete     Scheduled Meds:  aspirin EC  81 mg Oral Daily   atorvastatin  40 mg Oral Daily   insulin aspart  0-15 Units Subcutaneous TID WC   insulin aspart  0-5 Units Subcutaneous QHS   levothyroxine  75 mcg Oral QAC  breakfast   Continuous Infusions:  cefTRIAXone (ROCEPHIN)  IV 1 g (01/20/21 1448)    Procedures/Studies: CT HEAD WO CONTRAST  Result Date: 01/18/2021 CLINICAL DATA:  Right-sided weakness. EXAM: CT HEAD WITHOUT CONTRAST TECHNIQUE: Contiguous axial images were obtained from the base of the skull through the vertex without intravenous contrast. COMPARISON:  Nov 17, 2020. FINDINGS: Brain: Mild chronic ischemic white matter disease is noted. Mild diffuse cortical atrophy is noted. No mass effect or midline shift is noted. Ventricular size is within normal limits. There is no evidence of mass lesion, hemorrhage or acute infarction. Vascular: No hyperdense vessel or unexpected calcification. Skull: Normal. Negative for fracture or focal lesion. Sinuses/Orbits: No acute finding. Other: None. IMPRESSION: No acute intracranial abnormality seen. Electronically Signed   By: Zenda Alpers.D.  On: 01/18/2021 13:59   MR ANGIO HEAD WO CONTRAST  Result Date: 01/18/2021 CLINICAL DATA:  Neuro deficit, acute stroke suspected. EXAM: MRI HEAD WITHOUT CONTRAST MRA HEAD WITHOUT CONTRAST TECHNIQUE: Multiplanar, multi-echo pulse sequences of the brain and surrounding structures were acquired without intravenous contrast. Angiographic images of the Circle of Willis were acquired using MRA technique without intravenous contrast. COMPARISON:  MRI/MRA Nov 17, 2020. FINDINGS: MRI HEAD FINDINGS Brain: Acute infarct in the posterolateral right medulla (see series 5, image 4). Associated mild edema without mass effect. Additional acute right PCA territory infarcts in the right occipital lobe and posterior right corpus callosum. Developing encephalomalacia in the areas of previously seen infarct in the inferior right cerebellum. Remote lacunar infarcts in the left thalamus and bilateral corona radiata and right caudate. Additional moderate scattered T2/FLAIR hyperintensities within the white matter, compatible with chronic  microvascular ischemic disease. Vascular: See below. Skull and upper cervical spine: Normal marrow signal. Degenerative change of the cervical spine, partially imaged. Sinuses/Orbits: Sinuses are largely clear.  Unremarkable orbits. Other: No sizable mastoid effusion. MRA HEAD FINDINGS Anterior circulation: Bilateral ICAs are patent. Severe stenosis of the distal left M1 MCA. Severe stenosis of bilateral M2 MCA branches. Bilateral A1 ACAs are patent. Multifocal severe stenosis of the right A2 ACA and severe stenosis of the proximal left A2 ACA. Posterior circulation: Similar multifocal moderate right Peyton Najjar of the visualized intradural vertebral artery. The more proximal V3 vertebral artery is not imaged on this study. The proximal right PICA is patent with no flow related signal in the more distal PICA, similar to prior. The intradural left vertebral artery and the basilar artery are patent. Severe stenosis of bilateral P2 PCAs with poor flow related signal in the more distal PCAs. IMPRESSION: MRI: 1. Acute infarct in the posterolateral right medulla, likely right PICA territory. Additional acute right PCA territory infarcts in the right occipital lobe and posterior right corpus callosum. Associated mild edema without mass effect. 2. Developing encephalomalacia in the areas of previously seen infarct in the inferior right cerebellum. 3. Remote lacunar infarcts in the left thalamus, right caudate, and bilateral corona radiata. 4. Moderate chronic microvascular disease. MRA: 1. Severe stenosis of bilateral P2 PCAs with poor flow related signal in the more distal PCAs. Also, severe stenosis of the distal left M1 MCA, bilateral M2 MCA branches and bilateral A2 ACAs. 2. Similar multifocal moderate narrowing of the distal right intradural vertebral artery. Similar flow related signal in the proximal right PICA, but not the more distal right PICA. Electronically Signed   By: Feliberto Harts MD   On: 01/18/2021 16:24    MR Brain Wo Contrast (neuro protocol)  Result Date: 01/18/2021 CLINICAL DATA:  Neuro deficit, acute stroke suspected. EXAM: MRI HEAD WITHOUT CONTRAST MRA HEAD WITHOUT CONTRAST TECHNIQUE: Multiplanar, multi-echo pulse sequences of the brain and surrounding structures were acquired without intravenous contrast. Angiographic images of the Circle of Willis were acquired using MRA technique without intravenous contrast. COMPARISON:  MRI/MRA Nov 17, 2020. FINDINGS: MRI HEAD FINDINGS Brain: Acute infarct in the posterolateral right medulla (see series 5, image 4). Associated mild edema without mass effect. Additional acute right PCA territory infarcts in the right occipital lobe and posterior right corpus callosum. Developing encephalomalacia in the areas of previously seen infarct in the inferior right cerebellum. Remote lacunar infarcts in the left thalamus and bilateral corona radiata and right caudate. Additional moderate scattered T2/FLAIR hyperintensities within the white matter, compatible with chronic microvascular ischemic disease. Vascular: See below. Skull  and upper cervical spine: Normal marrow signal. Degenerative change of the cervical spine, partially imaged. Sinuses/Orbits: Sinuses are largely clear.  Unremarkable orbits. Other: No sizable mastoid effusion. MRA HEAD FINDINGS Anterior circulation: Bilateral ICAs are patent. Severe stenosis of the distal left M1 MCA. Severe stenosis of bilateral M2 MCA branches. Bilateral A1 ACAs are patent. Multifocal severe stenosis of the right A2 ACA and severe stenosis of the proximal left A2 ACA. Posterior circulation: Similar multifocal moderate right Peyton Najjar of the visualized intradural vertebral artery. The more proximal V3 vertebral artery is not imaged on this study. The proximal right PICA is patent with no flow related signal in the more distal PICA, similar to prior. The intradural left vertebral artery and the basilar artery are patent. Severe stenosis of  bilateral P2 PCAs with poor flow related signal in the more distal PCAs. IMPRESSION: MRI: 1. Acute infarct in the posterolateral right medulla, likely right PICA territory. Additional acute right PCA territory infarcts in the right occipital lobe and posterior right corpus callosum. Associated mild edema without mass effect. 2. Developing encephalomalacia in the areas of previously seen infarct in the inferior right cerebellum. 3. Remote lacunar infarcts in the left thalamus, right caudate, and bilateral corona radiata. 4. Moderate chronic microvascular disease. MRA: 1. Severe stenosis of bilateral P2 PCAs with poor flow related signal in the more distal PCAs. Also, severe stenosis of the distal left M1 MCA, bilateral M2 MCA branches and bilateral A2 ACAs. 2. Similar multifocal moderate narrowing of the distal right intradural vertebral artery. Similar flow related signal in the proximal right PICA, but not the more distal right PICA. Electronically Signed   By: Feliberto Harts MD   On: 01/18/2021 16:24   ECHOCARDIOGRAM COMPLETE  Result Date: 01/19/2021    ECHOCARDIOGRAM REPORT   Patient Name:   ALYJAH LOVINGOOD Date of Exam: 01/19/2021 Medical Rec #:  [id number]       Height:       71.0 in Accession #:    [id number]      Weight:       173.0 lb Date of Birth:  Jan 02, 1946        BSA:          1.983 m Patient Age:    75 years        BP:           175/88 mmHg Patient Gender: M               HR:           76 bpm. [facility] Procedure: 2D Echo, Cardiac Doppler and Color Doppler Indications:    CVA  History:        Patient has prior history of Echocardiogram examinations, most                 recent 11/18/2020. CAD, Prior CABG, Stroke and PVD; Risk                 Factors:Hypertension and Diabetes.  Sonographer:    Lavenia Atlas RDCS Referring Phys: [phone] Heloise Beecham [facility] IMPRESSIONS  1. Left ventricular ejection fraction, by estimation, is 70 to 75%. The left ventricle has hyperdynamic function.  The left ventricle has no regional wall motion abnormalities. There is mild left ventricular hypertrophy. Left ventricular diastolic parameters are indeterminate.  2. Right ventricular systolic function is normal. The right ventricular size is normal. Tricuspid regurgitation signal is inadequate for assessing PA pressure.  3. The mitral  valve is normal in structure. No evidence of mitral valve regurgitation. No evidence of mitral stenosis.  4. The aortic valve is tricuspid. Aortic valve regurgitation is not visualized. No aortic stenosis is present. FINDINGS  Left Ventricle: Left ventricular ejection fraction, by estimation, is 70 to 75%. The left ventricle has hyperdynamic function. The left ventricle has no regional wall motion abnormalities. The left ventricular internal cavity size was normal in size. There is mild left ventricular hypertrophy. Left ventricular diastolic parameters are indeterminate. Right Ventricle: The right ventricular size is normal. No increase in right ventricular wall thickness. Right ventricular systolic function is normal. Tricuspid regurgitation signal is inadequate for assessing PA pressure. Left Atrium: Left atrial size was normal in size. Right Atrium: Right atrial size was normal in size. Pericardium: There is no evidence of pericardial effusion. Mitral Valve: The mitral valve is normal in structure. No evidence of mitral valve regurgitation. No evidence of mitral valve stenosis. Tricuspid Valve: The tricuspid valve is normal in structure. Tricuspid valve regurgitation is not demonstrated. No evidence of tricuspid stenosis. Aortic Valve: The aortic valve is tricuspid. Aortic valve regurgitation is not visualized. No aortic stenosis is present. Aortic valve mean gradient measures 3.1 mmHg. Aortic valve peak gradient measures 5.4 mmHg. Aortic valve area, by VTI measures 2.62 cm. Pulmonic Valve: The pulmonic valve was not well visualized. Pulmonic valve regurgitation is not  visualized. No evidence of pulmonic stenosis. Aorta: The aortic root is normal in size and structure. IAS/Shunts: The interatrial septum was not well visualized.  LEFT VENTRICLE PLAX 2D LVIDd:         3.97 cm  Diastology LVIDs:         2.14 cm  LV e' medial:    5.34 cm/s LV PW:         1.28 cm  LV E/e' medial:  12.7 LV IVS:        1.16 cm  LV e' lateral:   9.37 cm/s LVOT diam:     2.10 cm  LV E/e' lateral: 7.2 LV SV:         58 LV SV Index:   29 LVOT Area:     3.46 cm  RIGHT VENTRICLE RV Basal diam:  3.05 cm RV S prime:     7.80 cm/s TAPSE (M-mode): 1.5 cm LEFT ATRIUM             Index       RIGHT ATRIUM           Index LA diam:        3.40 cm 1.71 cm/m  RA Area:     14.40 cm LA Vol (A2C):   36.8 ml 18.56 ml/m RA Volume:   35.00 ml  17.65 ml/m LA Vol (A4C):   54.4 ml 27.44 ml/m LA Biplane Vol: 47.3 ml 23.86 ml/m  AORTIC VALVE AV Area (Vmax):    2.54 cm AV Area (Vmean):   2.50 cm AV Area (VTI):     2.62 cm AV Vmax:           116.19 cm/s AV Vmean:          80.913 cm/s AV VTI:            0.222 m AV Peak Grad:      5.4 mmHg AV Mean Grad:      3.1 mmHg LVOT Vmax:         85.20 cm/s LVOT Vmean:        58.400 cm/s LVOT VTI:  0.168 m LVOT/AV VTI ratio: 0.76  AORTA Ao Root diam: 3.60 cm MITRAL VALVE MV Area (PHT): 6.96 cm     SHUNTS MV Decel Time: 109 msec     Systemic VTI:  0.17 m MV E velocity: 67.70 cm/s   Systemic Diam: 2.10 cm MV A velocity: 108.00 cm/s MV E/A ratio:  0.63 Dina Rich MD Electronically signed by Dina Rich MD Signature Date/Time: 01/19/2021/9:34:14 AM    Final     Catarina Hartshorn, DO  [facility]  If 7PM-7AM, please contact night-coverage [email or web address] Password [facility] 01/20/2021, 6:44 PM   LOS: 2 days

## 2021-01-20 NOTE — Consult Note (Signed)
[facility] Elsye Mccollister A. Gerilyn Pilgrim, MD     [email or web address]          Jason Cox is an 75 y.o. male.   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 The etiology is undoubtedly severe intracranial disease especially involving the vertebral artery on the right side. This is likely artery to artery embolism. Cardio embolism is less likely and there for a TEE is not recommended at this point. The patient should be placed on dual antiplatelet agents for 3-6 months. His statin and blood pressure and the blood sugar should be optimized.    This is a 75 year old male who presents with ongoing urinary symptoms over last several days. He status see medical attention for this problem but did realize that he had problems standing and fell a couple of times. Right side was quite weak. He also had a couple episodes of Visual deviation with items on the floor appearing to be on the ceiling. This happened twice and was associated with spinning.  The vertiginous symptoms lasted for about 5 minutes. It appears that it was also associated with burning pain involving the right cheek. The patient had a recent ischemic event and was discharged home on dual antiplatelet agents but tells me that he was only taking aspirin. This may have been to the patient completing the typical 4 week course of dual antiplatelet agents. The patient does not report having dysarthria or dysphagia however. There are no reports of chest pain or palpitation.  He has the chronic joint pain from rheumatoid arthritis. The review of systems otherwise negative.   GENERAL:  He is doing well at this time.  HEENT:  Normal  ABDOMEN: soft  EXTREMITIES: No edema; there is marked ulnar deviation and arthritic changes noted of the hands and also changes of the knees and ankles.   BACK: Normal  SKIN: Normal by inspection.    MENTAL STATUS: Alert and oriented -  including  orientation to his age and month. Speech, language and cognition are generally intact. Judgment and insight normal.   CRANIAL NERVES: Pupils are equal, round and reactive to light and accomodation; extra ocular movements are full, there is no significant nystagmus; visual fields are full; upper and lower facial muscles are normal in strength and symmetric, there is no flattening of the nasolabial folds; tongue is midline; uvula is midline; shoulder elevation is normal.  MOTOR: Normal tone, bulk and strength; no pronator drift. No drift of the upper lower extremities.  COORDINATION: Left finger to nose is normal, right finger to nose is normal, No rest tremor; no intention tremor; no postural tremor; no bradykinesia.  REFLEXES: Deep tendon reflexes are symmetrical and normal.   SENSATION: Normal to light touch, temperature, and pain.      Blood pressure 134/76, pulse 96, temperature 98.1 F (36.7 C), temperature source Oral, resp. rate 17, height  (1.803 m), weight 78.5 kg, SpO2 98 %.  Past Medical History:  Diagnosis Date   Hypertension    Benign Essential   Hypothyroidism    Obesity    Osteoarthritis    PVD (peripheral vascular disease) (HCC)    Type 2 diabetes mellitus (HCC)     Past Surgical History:  Procedure Laterality Date   APPENDECTOMY     TONSILLECTOMY      Family History  Problem Relation Age of Onset   Osteoporosis Mother    Hypertension Mother    Cancer Mother    Heart attack  Father    Heart attack Maternal Grandmother    Pneumonia Maternal Grandfather    Heart disease Paternal Grandmother    Heart disease Paternal Grandfather     Social History:  reports that he has quit smoking. He has never used smokeless tobacco. He reports previous alcohol use. He reports that he does not use drugs.  Allergies: No Known Allergies  Medications: Prior to Admission medications   Medication Sig Start Date End Date Taking? Authorizing Provider  Ascorbic Acid  (VITAMIN C) 100 MG tablet Take 100 mg by mouth daily.   Yes [provider]  aspirin EC 81 MG tablet Take 1 tablet (81 mg total) by mouth daily. Swallow whole. 11/18/20 11/18/21 Yes Vassie Loll, MD  Cholecalciferol (VITAMIN D3) 10 MCG (400 UNIT) CAPS Take 1 capsule by mouth daily.   Yes [provider]  diltiazem (CARDIZEM CD) 240 MG 24 hr capsule Take 240 mg by mouth daily. 01/14/21  Yes [provider]  indomethacin (INDOCIN) 25 MG capsule Take 25 mg by mouth 4 (four) times daily as needed. 11/19/19  Yes [provider]  levothyroxine (SYNTHROID) 75 MCG tablet Take 75 mcg by mouth daily before breakfast. 11/22/19  Yes [provider]  losartan (COZAAR) 100 MG tablet Take 100 mg by mouth daily. 10/19/19  Yes [provider]  metFORMIN (GLUCOPHAGE-XR) 500 MG 24 hr tablet Take 1,000 mg by mouth in the morning and at bedtime. 11/01/19  Yes [provider]  Omega-3 Fatty Acids (FISH OIL PO) Take 2,500 mg by mouth in the morning and at bedtime.   Yes [provider]  amLODipine (NORVASC) 10 MG tablet Take 10 mg by mouth daily. Patient not taking: Reported on 01/18/2021 12/10/19   [provider]  atorvastatin (LIPITOR) 40 MG tablet Take 1 tablet (40 mg total) by mouth daily. Patient not taking: No sig reported 11/18/20 11/18/21  Vassie Loll, MD  clopidogrel (PLAVIX) 75 MG tablet Take 1 tablet (75 mg total) by mouth daily. Patient not taking: No sig reported 11/18/20 11/18/21  Vassie Loll, MD  diltiazem (CARDIZEM CD) 360 MG 24 hr capsule Take 1 capsule (360 mg total) by mouth daily. Patient not taking: No sig reported 11/18/20 11/18/21  Vassie Loll, MD  metoprolol succinate (TOPROL-XL) 50 MG 24 hr tablet  12/10/19   [provider]  pantoprazole (PROTONIX) 40 MG tablet Take 1 tablet (40 mg total) by mouth daily. Patient not taking: Reported on 01/18/2021 11/19/20   Vassie Loll, MD    Scheduled Meds:  aspirin EC   81 mg Oral Daily   atorvastatin  40 mg Oral Daily   insulin aspart  0-15 Units Subcutaneous TID WC   insulin aspart  0-5 Units Subcutaneous QHS   levothyroxine  75 mcg Oral QAC breakfast   Continuous Infusions:  cefTRIAXone (ROCEPHIN)  IV 1 g (01/20/21 1448)   PRN Meds:.acetaminophen **OR** acetaminophen, ondansetron **OR** ondansetron (ZOFRAN) IV, polyethylene glycol     Results for orders placed or performed during the hospital encounter of 01/18/21 (from the past 48 hour(s))  Glucose, capillary     Status: Abnormal   Collection Time: 01/18/21  9:15 PM  Result Value Ref Range   Glucose-Capillary 233 (H) 70 - 99 mg/dL    Comment: Glucose reference range applies only to samples taken after fasting for at least 8 hours.  Glucose, capillary     Status: Abnormal   Collection Time: 01/19/21  7:41 AM  Result Value Ref Range  Glucose-Capillary 233 (H) 70 - 99 mg/dL    Comment: Glucose reference range applies only to samples taken after fasting for at least 8 hours.  Glucose, capillary     Status: Abnormal   Collection Time: 01/19/21 11:54 AM  Result Value Ref Range   Glucose-Capillary 200 (H) 70 - 99 mg/dL    Comment: Glucose reference range applies only to samples taken after fasting for at least 8 hours.  Glucose, capillary     Status: Abnormal   Collection Time: 01/19/21  4:32 PM  Result Value Ref Range   Glucose-Capillary 155 (H) 70 - 99 mg/dL    Comment: Glucose reference range applies only to samples taken after fasting for at least 8 hours.  Glucose, capillary     Status: Abnormal   Collection Time: 01/19/21  7:54 PM  Result Value Ref Range   Glucose-Capillary 188 (H) 70 - 99 mg/dL    Comment: Glucose reference range applies only to samples taken after fasting for at least 8 hours.  Basic metabolic panel     Status: Abnormal   Collection Time: 01/20/21  7:05 AM  Result Value Ref Range   Sodium 137 135 - 145 mmol/L   Potassium 5.1 3.5 - 5.1 mmol/L   Chloride 104 98 - 111  mmol/L   CO2 23 22 - 32 mmol/L   Glucose, Bld 207 (H) 70 - 99 mg/dL    Comment: Glucose reference range applies only to samples taken after fasting for at least 8 hours.   BUN 26 (H) 8 - 23 mg/dL   Creatinine, Ser 1.61 0.61 - 1.24 mg/dL   Calcium 9.1 8.9 - 09.6 mg/dL   GFR, Estimated >04 >54 mL/min    Comment: (NOTE) Calculated using the CKD-EPI Creatinine Equation (2021)    Anion gap 10 5 - 15    Comment: Performed at [facility], [address] [place], [place] [address]  CBC     Status: Abnormal   Collection Time: 01/20/21  7:05 AM  Result Value Ref Range   WBC 11.6 (H) 4.0 - 10.5 K/uL   RBC 4.04 (L) 4.22 - 5.81 MIL/uL   Hemoglobin 12.8 (L) 13.0 - 17.0 g/dL   HCT 91.4 78.2 - 95.6 %   MCV 96.5 80.0 - 100.0 fL   MCH 31.7 26.0 - 34.0 pg   MCHC 32.8 30.0 - 36.0 g/dL   RDW 21.3 08.6 - 57.8 %   Platelets 333 150 - 400 K/uL   nRBC 0.0 0.0 - 0.2 %    Comment: Performed at [facility], [address] [place], [place] [address]  Lipid panel     Status: Abnormal   Collection Time: 01/20/21  7:05 AM  Result Value Ref Range   Cholesterol 217 (H) 0 - 200 mg/dL   Triglycerides 952 (H) <150 mg/dL   HDL 37 (L) >84 mg/dL   Total CHOL/HDL Ratio 5.9 RATIO   VLDL 30 0 - 40 mg/dL   LDL Cholesterol 132 (H) 0 - 99 mg/dL    Comment:        Total Cholesterol/HDL:CHD Risk Coronary Heart Disease Risk Table                     Men   Women  1/2 Average Risk   3.4   3.3  Average Risk       5.0   4.4  2 X Average Risk   9.6   7.1  3 X Average Risk  23.4   11.0        Use the calculated Patient Ratio above and the CHD Risk Table to determine the patient's CHD Risk.        ATP III CLASSIFICATION (LDL):  <100     mg/dL   Optimal  938-101  mg/dL   Near or Above                    Optimal  130-159  mg/dL   Borderline  751-025  mg/dL   High  >852     mg/dL   Very High Performed at [facility], [address], [place], [place] [address]   Glucose, capillary     Status: Abnormal   Collection  Time: 01/20/21  7:32 AM  Result Value Ref Range   Glucose-Capillary 169 (H) 70 - 99 mg/dL    Comment: Glucose reference range applies only to samples taken after fasting for at least 8 hours.  Glucose, capillary     Status: Abnormal   Collection Time: 01/20/21 11:33 AM  Result Value Ref Range   Glucose-Capillary 179 (H) 70 - 99 mg/dL    Comment: Glucose reference range applies only to samples taken after fasting for at least 8 hours.  Glucose, capillary     Status: Abnormal   Collection Time: 01/20/21  4:46 PM  Result Value Ref Range   Glucose-Capillary 151 (H) 70 - 99 mg/dL    Comment: Glucose reference range applies only to samples taken after fasting for at least 8 hours.    Studies/Results:  TTE  1. Left ventricular ejection fraction, by estimation, is 70 to 75%. The  left ventricle has hyperdynamic function. The left ventricle has no  regional wall motion abnormalities. There is mild left ventricular  hypertrophy. Left ventricular diastolic  parameters are indeterminate.   2. Right ventricular systolic function is normal. The right ventricular  size is normal. Tricuspid regurgitation signal is inadequate for assessing  PA pressure.   3. The mitral valve is normal in structure. No evidence of mitral valve  regurgitation. No evidence of mitral stenosis.   4. The aortic valve is tricuspid. Aortic valve regurgitation is not  visualized. No aortic stenosis is present.     BRAIN MRI MRA FINDINGS: MRI HEAD FINDINGS   Brain: Acute infarct in the posterolateral right medulla (see series 5, image 4). Associated mild edema without mass effect. Additional acute right PCA territory infarcts in the right occipital lobe and posterior right corpus callosum. Developing encephalomalacia in the areas of previously seen infarct in the inferior right cerebellum. Remote lacunar infarcts in the left thalamus and bilateral corona radiata and right caudate. Additional moderate scattered  T2/FLAIR hyperintensities within the white matter, compatible with chronic microvascular ischemic disease.   Vascular: See below.   Skull and upper cervical spine: Normal marrow signal. Degenerative change of the cervical spine, partially imaged.   Sinuses/Orbits: Sinuses are largely clear.  Unremarkable orbits.   Other: No sizable mastoid effusion.   MRA HEAD FINDINGS   Anterior circulation: Bilateral ICAs are patent. Severe stenosis of the distal left M1 MCA. Severe stenosis of bilateral M2 MCA branches. Bilateral A1 ACAs are patent. Multifocal severe stenosis of the right A2 ACA and severe stenosis of the proximal left A2 ACA.   Posterior circulation: Similar multifocal moderate right Peyton Najjar of the visualized intradural vertebral artery. The more proximal V3 vertebral artery is not imaged on this study. The proximal right PICA is patent with no flow  related signal in the more distal PICA, similar to prior. The intradural left vertebral artery and the basilar artery are patent. Severe stenosis of bilateral P2 PCAs with poor flow related signal in the more distal PCAs.   IMPRESSION: MRI:   1. Acute infarct in the posterolateral right medulla, likely right PICA territory. Additional acute right PCA territory infarcts in the right occipital lobe and posterior right corpus callosum. Associated mild edema without mass effect. 2. Developing encephalomalacia in the areas of previously seen infarct in the inferior right cerebellum. 3. Remote lacunar infarcts in the left thalamus, right caudate, and bilateral corona radiata. 4. Moderate chronic microvascular disease.   MRA:   1. Severe stenosis of bilateral P2 PCAs with poor flow related signal in the more distal PCAs. Also, severe stenosis of the distal left M1 MCA, bilateral M2 MCA branches and bilateral A2 ACAs. 2. Similar multifocal moderate narrowing of the distal right intradural vertebral artery. Similar flow related  signal in the proximal right PICA, but not the more distal right PICA.        The brain MRI is reviewed in person shows multiple medium-size faint increased signal on DWI involving the PCA distribution on the right side and in particular the corpus callosum and occipital lobe. There is also a tiny signal noted in the lateral medullary area on the ipsilateral side. No hemorrhages noted. MRA shows multilevel disease involving the MCA is bilaterally, right vertebral and absent PICA on the right.   Liat Mayol A. Gerilyn Pilgrim, M.D.  Diplomate, Biomedical engineer of Psychiatry and Neurology ( Neurology). 01/20/2021, 5:50 PM

## 2021-01-21 LAB — GLUCOSE, CAPILLARY
Glucose-Capillary: 254 mg/dL — ABNORMAL HIGH (ref 70–99)
Glucose-Capillary: 209 mg/dL — ABNORMAL HIGH (ref 70–99)
Glucose-Capillary: 252 mg/dL — ABNORMAL HIGH (ref 70–99)
Glucose-Capillary: 218 mg/dL — ABNORMAL HIGH (ref 70–99)

## 2021-01-21 LAB — BASIC METABOLIC PANEL
Anion gap: 12 (ref 5–15)
BUN: 31 mg/dL — ABNORMAL HIGH (ref 8–23)
CO2: 23 mmol/L (ref 22–32)
Calcium: 9.5 mg/dL (ref 8.9–10.3)
Chloride: 102 mmol/L (ref 98–111)
Creatinine, Ser: 1.25 mg/dL — ABNORMAL HIGH (ref 0.61–1.24)
GFR, Estimated: 59 mL/min — ABNORMAL LOW (ref >60)
Glucose, Bld: 243 mg/dL — ABNORMAL HIGH (ref 70–99)
Potassium: 5.1 mmol/L (ref 3.5–5.1)
Sodium: 137 mmol/L (ref 135–145)

## 2021-01-21 LAB — HEPATIC FUNCTION PANEL
ALT: 11 U/L (ref 0–44)
AST: 9 U/L — ABNORMAL LOW (ref 15–41)
Albumin: 3.2 g/dL — ABNORMAL LOW (ref 3.5–5.0)
Alkaline Phosphatase: 134 U/L — ABNORMAL HIGH (ref 38–126)
Bilirubin, Direct: <0.1 mg/dL (ref 0.0–0.2)
Total Bilirubin: 0.7 mg/dL (ref 0.3–1.2)
Total Protein: 7.6 g/dL (ref 6.5–8.1)

## 2021-01-21 LAB — URINE CULTURE: Culture: >=100000 — AB

## 2021-01-21 LAB — RESP PANEL BY RT-PCR (FLU A&B, COVID) ARPGX2
Influenza A by PCR: NEGATIVE
Influenza B by PCR: NEGATIVE
SARS Coronavirus 2 by RT PCR: NEGATIVE

## 2021-01-21 LAB — MAGNESIUM: Magnesium: 1.8 mg/dL (ref 1.7–2.4)

## 2021-01-21 LAB — CBC
HCT: 41.6 % (ref 39.0–52.0)
Hemoglobin: 13.6 g/dL (ref 13.0–17.0)
MCH: 31.3 pg (ref 26.0–34.0)
MCHC: 32.7 g/dL (ref 30.0–36.0)
MCV: 95.9 fL (ref 80.0–100.0)
Platelets: 390 10*3/uL (ref 150–400)
RBC: 4.34 MIL/uL (ref 4.22–5.81)
RDW: 13.5 % (ref 11.5–15.5)
WBC: 13.5 10*3/uL — ABNORMAL HIGH (ref 4.0–10.5)
nRBC: 0 % (ref 0.0–0.2)

## 2021-01-21 LAB — HEMOGLOBIN A1C
Hgb A1c MFr Bld: 8.3 % — ABNORMAL HIGH (ref 4.8–5.6)
Mean Plasma Glucose: 192 mg/dL

## 2021-01-21 MED ORDER — MAGNESIUM SULFATE 2 GM/50ML IV SOLN
2.0000 g | Freq: Once | INTRAVENOUS | Status: AC
  Administered 2021-01-21: 2 g via INTRAVENOUS
  Filled 2021-01-21: qty 50

## 2021-01-21 MED ORDER — ENOXAPARIN SODIUM 40 MG/0.4ML IJ SOSY
40.0000 mg | PREFILLED_SYRINGE | Freq: Every day | INTRAMUSCULAR | Status: DC
  Administered 2021-01-21 – 2021-01-22 (×2): 40 mg via SUBCUTANEOUS
  Filled 2021-01-21 (×2): qty 0.4

## 2021-01-21 MED ORDER — ATORVASTATIN CALCIUM 40 MG PO TABS: 80.0000 mg | ORAL_TABLET | Freq: Every evening | ORAL | Status: DC

## 2021-01-21 MED ORDER — SODIUM CHLORIDE 0.9 % IV SOLN: INTRAVENOUS | Status: AC

## 2021-01-21 MED ORDER — PANTOPRAZOLE SODIUM 40 MG IV SOLR
40.0000 mg | Freq: Two times a day (BID) | INTRAVENOUS | Status: DC
  Administered 2021-01-21 – 2021-01-22 (×3): 40 mg via INTRAVENOUS
  Filled 2021-01-21 (×3): qty 40

## 2021-01-21 NOTE — Progress Notes (Addendum)
PROGRESS NOTE  Jason Cox ZOX:[id number] DOB: 09-10-1945 DOA: 01/18/2021 PCP: Elfredia Nevins, MD  Brief History:  As per H&P written by Dr. Mariea Clonts on 01/18/2021 Jason Cox is a 75 y.o. male with medical history significant for hypertension, diabetes, recent acute CVA. Presented to ED with complaints of right-sided weakness involving his right lower extremity.  He reports numbness involving his right upper extremity.  No facial asymmetry no change in vision no difficulty swallowing no abnormal sensation involving any other parts of his body.  Patient reports 2 falls today due to weakness in his right lower extremity. Patient reports he has been compliant with aspirin daily, but is not familiar with the name Plavix/clopidogrel and does not think he has been taking it, does not think he has been taking a cholesterol medication either.   Hospitalization 5/24 - 5/25-presented with dizziness, head CT showed acute infarct in the right inferior cerebellum consistent with right PICA territory stroke.  Patient completed stroke work-up to include MRA head, carotid Dopplers and echocardiogram.  Neurology was consulted and plan was for patient to be discharge to complete 1 month of dual antiplatelets and then continue with aspirin daily.   Also diagnosed with urinary tract infection- cultures grew staph aureus was treated with Rocephin and discharged on Keflex.  Assessment/Plan: Acute stroke due to ischemia [facility]) -2D echo not demonstrating significant abnormalities -Neurology consult appreciated -Given multiple infarcts and locations affected on MRI he might likely require a TEE -Continue aspirin and Plavix--pt states he was only took ASA after his May 2022 discharge -Continue risk factor modification. -MR brain--acute infarct posterior R-medulla -MRA brain--no LVO;  severe stenosis bilateral PCAs -11/18/20 carotid US--no hemodynamically significant stenosis -PT eval-->SNF with which  patient agrees   Essential hypertension -Stable currently -Allowing permissive hypertension. -holding metoprolol succinate, cardizem and losartan  Dysrhythmia -7/28--personally reviewed EKG--sinus, first degree AVB, RBBB -optimize K and mag   Diabetes mellitus type 2, uncontrolled with hyperglycemia -Continue the use of a sliding scale insulin -A1c 8.6 -Holding oral hypoglycemic agents while inpatient. -7/28--2 episodes n/v-->start PPI -start IVF -restart metformin after d/c -plan to add glipizide after d/c   Acute lower UTI -Complaining of dysuria -Follow urine cultures>>prelim = staph aureus -change ceftriaxone to cefazolin   hyperlipidemia -Continue statins. -LDL 150 -increased lipitor to 80 mg   hypothyroidism -Continue Synthroid.   Physical deconditioning -With ongoing poor balance and right-sided weakness -Appreciate evaluation by PT>>SNF -Skilled nursing facility has been recommended at this point. -pt initially refused SNF, now agrees       Status is: Inpatient  Remains inpatient appropriate because:IV treatments appropriate due to intensity of illness or inability to take PO  Dispo: The patient is from: Home              Anticipated d/c is to: SNF              Patient currently is not medically stable to d/c.   Difficult to place patient No        Family Communication:   Brother updated at bedside 7/28  Consultants: neurology   Code Status:  FULL  DVT Prophylaxis: [place] Lovenox   Procedures: As Listed in Progress Note Above  Antibiotics: Cefazolin 7/27>> -ceftriaxone 7/25>>7/27        Subjective: Had 2 episodes n/v without blood this am.  Denies f/c, headache, cp, sob, diarrhea, abd pain, dysuria  Objective: Vitals:   01/20/21 1043 01/20/21 1913 01/21/21 0439  01/21/21 0819  BP: 134/76 (!) 120/59 (!) 145/79 (!) 168/77  Pulse: 96 99 (!) 51 87  Resp: Temp: 98.1 F (36.7 C) 98 F (36.7 C) 97.9 F (36.6 C) 98.1 F  (36.7 C)  TempSrc: Oral  Oral Oral  SpO2: 98% 97% 100% 98%  Weight:      Height:        Intake/Output Summary (Last 24 hours) at 01/21/2021 0951 Last data filed at 01/21/2021 0947 Gross per 24 hour  Intake 500.87 ml  Output --  Net 500.87 ml   Weight change:  Exam:  General:  Pt is alert, follows commands appropriately, not in acute distress HEENT: No icterus, No thrush, No neck mass, [place]/AT Cardiovascular: RRR, S1/S2, no rubs, no gallops Respiratory: fine bibasilar rales. No wheeze Abdomen: Soft/+BS, non tender, non distended, no guarding Extremities: No edema, No lymphangitis, No petechiae, No rashes, no synovitis   Data Reviewed: I have personally reviewed following labs and imaging studies Basic Metabolic Panel: Recent Labs  Lab 01/18/21 1217 01/18/21 1245 01/20/21 0705 01/21/21 0424  NA 137 140 137 137  K 4.9 5.2* 5.1 5.1  CL 106 107 104 102  CO2 23  --  23 23  GLUCOSE 260* 258* 207* 243*  BUN 22 23 26* 31*  CREATININE 1.19 1.10 1.22 1.25*  CALCIUM 9.6  --  9.1 9.5  MG  --   --   --  1.8   Liver Function Tests: Recent Labs  Lab 01/18/21 1217 01/21/21 0630  AST 12* 9*  ALT 16 11  ALKPHOS 161* 134*  BILITOT 0.5 0.7  PROT 8.2* 7.6  ALBUMIN 3.6 3.2*   No results for input(s): LIPASE, AMYLASE in the last 168 hours. No results for input(s): AMMONIA in the last 168 hours. Coagulation Profile: Recent Labs  Lab 01/18/21 1217  INR 1.0   CBC: Recent Labs  Lab 01/18/21 1217 01/18/21 1245 01/20/21 0705 01/21/21 0424  WBC 13.5*  --  11.6* 13.5*  NEUTROABS 11.5*  --   --   --   HGB 13.2 13.6 12.8* 13.6  HCT 40.5 40.0 39.0 41.6  MCV 96.4  --  96.5 95.9  PLT 377  --  333 390   Cardiac Enzymes: No results for input(s): CKTOTAL, CKMB, CKMBINDEX, TROPONINI in the last 168 hours. BNP: Invalid input(s): POCBNP CBG: Recent Labs  Lab 01/20/21 1133 01/20/21 1646 01/20/21 1915 01/20/21 2128 01/21/21 0752  GLUCAP 179* 151* 178* 161* 254*    HbA1C: Recent Labs    01/20/21 0705  HGBA1C 8.3*   Urine analysis:    Component Value Date/Time   COLORURINE YELLOW 01/18/2021 1202   APPEARANCEUR CLOUDY (A) 01/18/2021 1202   LABSPEC 1.013 01/18/2021 1202   PHURINE 7.0 01/18/2021 1202   GLUCOSEU >=500 (A) 01/18/2021 1202   HGBUR MODERATE (A) 01/18/2021 1202   BILIRUBINUR NEGATIVE 01/18/2021 1202   KETONESUR 20 (A) 01/18/2021 1202   PROTEINUR 100 (A) 01/18/2021 1202   UROBILINOGEN 0.2 05/22/2007 0025   NITRITE NEGATIVE 01/18/2021 1202   LEUKOCYTESUR LARGE (A) 01/18/2021 1202   Sepsis Labs: (procalcitonin:4,lacticidven:4) ) Recent Results (from the past 240 hour(s))  Resp Panel by RT-PCR (Flu A&B, Covid) Nasopharyngeal Swab     Status: None   Collection Time: 01/18/21 11:56 AM   Specimen: Nasopharyngeal Swab; Nasopharyngeal(NP) swabs in vial transport medium  Result Value Ref Range Status   SARS Coronavirus 2 by RT PCR NEGATIVE NEGATIVE Final    Comment: (NOTE) SARS-CoV-2  target nucleic acids are NOT DETECTED.  The SARS-CoV-2 RNA is generally detectable in upper respiratory specimens during the acute phase of infection. The lowest concentration of SARS-CoV-2 viral copies this assay can detect is 138 copies/mL. A negative result does not preclude SARS-Cov-2 infection and should not be used as the sole basis for treatment or other patient management decisions. A negative result may occur with  improper specimen collection/handling, submission of specimen other than nasopharyngeal swab, presence of viral mutation(s) within the areas targeted by this assay, and inadequate number of viral copies(<138 copies/mL). A negative result must be combined with clinical observations, patient history, and epidemiological information. The expected result is Negative.  Fact Sheet for Patients:  BloggerCourse.com  Fact Sheet for Healthcare Providers:   SeriousBroker.it  This test is no t yet approved or cleared by the [place] FDA and  has been authorized for detection and/or diagnosis of SARS-CoV-2 by FDA under an Emergency Use Authorization (EUA). This EUA will remain  in effect (meaning this test can be used) for the duration of the COVID-19 declaration under Section 564(b)(1) of the Act, 21 U.S.C.section 360bbb-3(b)(1), unless the authorization is terminated  or revoked sooner.       Influenza A by PCR NEGATIVE NEGATIVE Final   Influenza B by PCR NEGATIVE NEGATIVE Final    Comment: (NOTE) The Xpert Xpress SARS-CoV-2/FLU/RSV plus assay is intended as an aid in the diagnosis of influenza from Nasopharyngeal swab specimens and should not be used as a sole basis for treatment. Nasal washings and aspirates are unacceptable for Xpert Xpress SARS-CoV-2/FLU/RSV testing.  Fact Sheet for Patients: BloggerCourse.com  Fact Sheet for Healthcare Providers: SeriousBroker.it  This test is not yet approved or cleared by the [place] FDA and has been authorized for detection and/or diagnosis of SARS-CoV-2 by FDA under an Emergency Use Authorization (EUA). This EUA will remain in effect (meaning this test can be used) for the duration of the COVID-19 declaration under Section 564(b)(1) of the Act, 21 U.S.C. section 360bbb-3(b)(1), unless the authorization is terminated or revoked.  Performed at [facility], [address] [place], [place] [address]   Urine Culture     Status: Abnormal (Preliminary result)   Collection Time: 01/18/21  1:54 PM   Specimen: Urine, Clean Catch  Result Value Ref Range Status   Specimen Description   Final    URINE, CLEAN CATCH Performed at [facility], [address], [place], [place] [address]    Special Requests   Final    NONE Performed at [facility], [address] [place], [place] [address]    Culture (A)   Final    >=100,000 COLONIES/mL STAPHYLOCOCCUS AUREUS SUSCEPTIBILITIES TO FOLLOW Performed at [facility], [address], [place], [place] [address]    Report Status PENDING  Incomplete     Scheduled Meds:  aspirin EC  81 mg Oral Daily   [START ON 01/22/2021] atorvastatin  80 mg Oral QPM   clopidogrel  75 mg Oral Daily   insulin aspart  0-15 Units Subcutaneous TID WC   insulin aspart  0-5 Units Subcutaneous QHS   levothyroxine  75 mcg Oral QAC breakfast   pantoprazole (PROTONIX) IV  40 mg Intravenous Q12H   Continuous Infusions:  sodium chloride      ceFAZolin (ANCEF) IV 2 g (01/21/21 0553)   magnesium sulfate bolus IVPB      Procedures/Studies: CT HEAD WO CONTRAST  Result Date: 01/18/2021 CLINICAL DATA:  Right-sided weakness. EXAM: CT  HEAD WITHOUT CONTRAST TECHNIQUE: Contiguous axial images were obtained from the base of the skull through the vertex without intravenous contrast. COMPARISON:  Nov 17, 2020. FINDINGS: Brain: Mild chronic ischemic white matter disease is noted. Mild diffuse cortical atrophy is noted. No mass effect or midline shift is noted. Ventricular size is within normal limits. There is no evidence of mass lesion, hemorrhage or acute infarction. Vascular: No hyperdense vessel or unexpected calcification. Skull: Normal. Negative for fracture or focal lesion. Sinuses/Orbits: No acute finding. Other: None. IMPRESSION: No acute intracranial abnormality seen. Electronically Signed   By: Lupita Raider M.D.   On: 01/18/2021 13:59   MR ANGIO HEAD WO CONTRAST  Result Date: 01/18/2021 CLINICAL DATA:  Neuro deficit, acute stroke suspected. EXAM: MRI HEAD WITHOUT CONTRAST MRA HEAD WITHOUT CONTRAST TECHNIQUE: Multiplanar, multi-echo pulse sequences of the brain and surrounding structures were acquired without intravenous contrast. Angiographic images of the Circle of Willis were acquired using MRA technique without intravenous contrast. COMPARISON:  MRI/MRA Nov 17, 2020.  FINDINGS: MRI HEAD FINDINGS Brain: Acute infarct in the posterolateral right medulla (see series 5, image 4). Associated mild edema without mass effect. Additional acute right PCA territory infarcts in the right occipital lobe and posterior right corpus callosum. Developing encephalomalacia in the areas of previously seen infarct in the inferior right cerebellum. Remote lacunar infarcts in the left thalamus and bilateral corona radiata and right caudate. Additional moderate scattered T2/FLAIR hyperintensities within the white matter, compatible with chronic microvascular ischemic disease. Vascular: See below. Skull and upper cervical spine: Normal marrow signal. Degenerative change of the cervical spine, partially imaged. Sinuses/Orbits: Sinuses are largely clear.  Unremarkable orbits. Other: No sizable mastoid effusion. MRA HEAD FINDINGS Anterior circulation: Bilateral ICAs are patent. Severe stenosis of the distal left M1 MCA. Severe stenosis of bilateral M2 MCA branches. Bilateral A1 ACAs are patent. Multifocal severe stenosis of the right A2 ACA and severe stenosis of the proximal left A2 ACA. Posterior circulation: Similar multifocal moderate right Peyton Najjar of the visualized intradural vertebral artery. The more proximal V3 vertebral artery is not imaged on this study. The proximal right PICA is patent with no flow related signal in the more distal PICA, similar to prior. The intradural left vertebral artery and the basilar artery are patent. Severe stenosis of bilateral P2 PCAs with poor flow related signal in the more distal PCAs. IMPRESSION: MRI: 1. Acute infarct in the posterolateral right medulla, likely right PICA territory. Additional acute right PCA territory infarcts in the right occipital lobe and posterior right corpus callosum. Associated mild edema without mass effect. 2. Developing encephalomalacia in the areas of previously seen infarct in the inferior right cerebellum. 3. Remote lacunar infarcts in  the left thalamus, right caudate, and bilateral corona radiata. 4. Moderate chronic microvascular disease. MRA: 1. Severe stenosis of bilateral P2 PCAs with poor flow related signal in the more distal PCAs. Also, severe stenosis of the distal left M1 MCA, bilateral M2 MCA branches and bilateral A2 ACAs. 2. Similar multifocal moderate narrowing of the distal right intradural vertebral artery. Similar flow related signal in the proximal right PICA, but not the more distal right PICA. Electronically Signed   By: Feliberto Harts MD   On: 01/18/2021 16:24   MR Brain Wo Contrast (neuro protocol)  Result Date: 01/18/2021 CLINICAL DATA:  Neuro deficit, acute stroke suspected. EXAM: MRI HEAD WITHOUT CONTRAST MRA HEAD WITHOUT CONTRAST TECHNIQUE: Multiplanar, multi-echo pulse sequences of the brain and surrounding structures were acquired without intravenous contrast. Angiographic  images of the Circle of Willis were acquired using MRA technique without intravenous contrast. COMPARISON:  MRI/MRA Nov 17, 2020. FINDINGS: MRI HEAD FINDINGS Brain: Acute infarct in the posterolateral right medulla (see series 5, image 4). Associated mild edema without mass effect. Additional acute right PCA territory infarcts in the right occipital lobe and posterior right corpus callosum. Developing encephalomalacia in the areas of previously seen infarct in the inferior right cerebellum. Remote lacunar infarcts in the left thalamus and bilateral corona radiata and right caudate. Additional moderate scattered T2/FLAIR hyperintensities within the white matter, compatible with chronic microvascular ischemic disease. Vascular: See below. Skull and upper cervical spine: Normal marrow signal. Degenerative change of the cervical spine, partially imaged. Sinuses/Orbits: Sinuses are largely clear.  Unremarkable orbits. Other: No sizable mastoid effusion. MRA HEAD FINDINGS Anterior circulation: Bilateral ICAs are patent. Severe stenosis of the distal  left M1 MCA. Severe stenosis of bilateral M2 MCA branches. Bilateral A1 ACAs are patent. Multifocal severe stenosis of the right A2 ACA and severe stenosis of the proximal left A2 ACA. Posterior circulation: Similar multifocal moderate right Peyton Najjar of the visualized intradural vertebral artery. The more proximal V3 vertebral artery is not imaged on this study. The proximal right PICA is patent with no flow related signal in the more distal PICA, similar to prior. The intradural left vertebral artery and the basilar artery are patent. Severe stenosis of bilateral P2 PCAs with poor flow related signal in the more distal PCAs. IMPRESSION: MRI: 1. Acute infarct in the posterolateral right medulla, likely right PICA territory. Additional acute right PCA territory infarcts in the right occipital lobe and posterior right corpus callosum. Associated mild edema without mass effect. 2. Developing encephalomalacia in the areas of previously seen infarct in the inferior right cerebellum. 3. Remote lacunar infarcts in the left thalamus, right caudate, and bilateral corona radiata. 4. Moderate chronic microvascular disease. MRA: 1. Severe stenosis of bilateral P2 PCAs with poor flow related signal in the more distal PCAs. Also, severe stenosis of the distal left M1 MCA, bilateral M2 MCA branches and bilateral A2 ACAs. 2. Similar multifocal moderate narrowing of the distal right intradural vertebral artery. Similar flow related signal in the proximal right PICA, but not the more distal right PICA. Electronically Signed   By: Feliberto Harts MD   On: 01/18/2021 16:24   ECHOCARDIOGRAM COMPLETE  Result Date: 01/19/2021    ECHOCARDIOGRAM REPORT   Patient Name:   LEX TRAUDT Date of Exam: 01/19/2021 Medical Rec #:  [id number]       Height:       71.0 in Accession #:    [id number]      Weight:       173.0 lb Date of Birth:  1946-06-16        BSA:          1.983 m Patient Age:    75 years        BP:           175/88 mmHg Patient  Gender: M               HR:           76 bpm. [facility] Procedure: 2D Echo, Cardiac Doppler and Color Doppler Indications:    CVA  History:        Patient has prior history of Echocardiogram examinations, most                 recent 11/18/2020. CAD,  Prior CABG, Stroke and PVD; Risk                 Factors:Hypertension and Diabetes.  Sonographer:    Lavenia Atlas RDCS Referring Phys: [phone] Heloise Beecham [facility] IMPRESSIONS  1. Left ventricular ejection fraction, by estimation, is 70 to 75%. The left ventricle has hyperdynamic function. The left ventricle has no regional wall motion abnormalities. There is mild left ventricular hypertrophy. Left ventricular diastolic parameters are indeterminate.  2. Right ventricular systolic function is normal. The right ventricular size is normal. Tricuspid regurgitation signal is inadequate for assessing PA pressure.  3. The mitral valve is normal in structure. No evidence of mitral valve regurgitation. No evidence of mitral stenosis.  4. The aortic valve is tricuspid. Aortic valve regurgitation is not visualized. No aortic stenosis is present. FINDINGS  Left Ventricle: Left ventricular ejection fraction, by estimation, is 70 to 75%. The left ventricle has hyperdynamic function. The left ventricle has no regional wall motion abnormalities. The left ventricular internal cavity size was normal in size. There is mild left ventricular hypertrophy. Left ventricular diastolic parameters are indeterminate. Right Ventricle: The right ventricular size is normal. No increase in right ventricular wall thickness. Right ventricular systolic function is normal. Tricuspid regurgitation signal is inadequate for assessing PA pressure. Left Atrium: Left atrial size was normal in size. Right Atrium: Right atrial size was normal in size. Pericardium: There is no evidence of pericardial effusion. Mitral Valve: The mitral valve is normal in structure. No evidence of mitral valve  regurgitation. No evidence of mitral valve stenosis. Tricuspid Valve: The tricuspid valve is normal in structure. Tricuspid valve regurgitation is not demonstrated. No evidence of tricuspid stenosis. Aortic Valve: The aortic valve is tricuspid. Aortic valve regurgitation is not visualized. No aortic stenosis is present. Aortic valve mean gradient measures 3.1 mmHg. Aortic valve peak gradient measures 5.4 mmHg. Aortic valve area, by VTI measures 2.62 cm. Pulmonic Valve: The pulmonic valve was not well visualized. Pulmonic valve regurgitation is not visualized. No evidence of pulmonic stenosis. Aorta: The aortic root is normal in size and structure. IAS/Shunts: The interatrial septum was not well visualized.  LEFT VENTRICLE PLAX 2D LVIDd:         3.97 cm  Diastology LVIDs:         2.14 cm  LV e' medial:    5.34 cm/s LV PW:         1.28 cm  LV E/e' medial:  12.7 LV IVS:        1.16 cm  LV e' lateral:   9.37 cm/s LVOT diam:     2.10 cm  LV E/e' lateral: 7.2 LV SV:         58 LV SV Index:   29 LVOT Area:     3.46 cm  RIGHT VENTRICLE RV Basal diam:  3.05 cm RV S prime:     7.80 cm/s TAPSE (M-mode): 1.5 cm LEFT ATRIUM             Index       RIGHT ATRIUM           Index LA diam:        3.40 cm 1.71 cm/m  RA Area:     14.40 cm LA Vol (A2C):   36.8 ml 18.56 ml/m RA Volume:   35.00 ml  17.65 ml/m LA Vol (A4C):   54.4 ml 27.44 ml/m LA Biplane Vol: 47.3 ml 23.86 ml/m  AORTIC VALVE AV Area (Vmax):  2.54 cm AV Area (Vmean):   2.50 cm AV Area (VTI):     2.62 cm AV Vmax:           116.19 cm/s AV Vmean:          80.913 cm/s AV VTI:            0.222 m AV Peak Grad:      5.4 mmHg AV Mean Grad:      3.1 mmHg LVOT Vmax:         85.20 cm/s LVOT Vmean:        58.400 cm/s LVOT VTI:          0.168 m LVOT/AV VTI ratio: 0.76  AORTA Ao Root diam: 3.60 cm MITRAL VALVE MV Area (PHT): 6.96 cm     SHUNTS MV Decel Time: 109 msec     Systemic VTI:  0.17 m MV E velocity: 67.70 cm/s   Systemic Diam: 2.10 cm MV A velocity: 108.00 cm/s MV  E/A ratio:  0.63 Dina Rich MD Electronically signed by Dina Rich MD Signature Date/Time: 01/19/2021/9:34:14 AM    Final     Catarina Hartshorn, DO  [facility]  If 7PM-7AM, please contact night-coverage [email or web address] Password [email or web address] 01/21/2021, 9:51 AM   LOS: 3 days

## 2021-01-21 NOTE — TOC Progression Note (Addendum)
Transition of Care [facility]) - Progression Note    Patient Details  Name: Jason Cox MRN: [id number] Date of Birth: 01-28-1946  Transition of Care [facility] CM/SW Contact  Leitha Bleak, RN Phone Number: 01/21/2021, 10:55 AM  Clinical Narrative:   Patient is now agreeing to SNF. FL2 completed and sent out. TOC to discuss bed offers with patient.   Addendum:  PNC offered and patient accepted. COVID test is negative.  Plan to DC patient to [facility] tomorrow.   Expected Discharge Plan: Home w Home Health Services Barriers to Discharge: Continued Medical Work up  Expected Discharge Plan and Services Expected Discharge Plan: Home w Home Health Services                      Readmission Risk Interventions Readmission Risk Prevention Plan 01/19/2021  Medication Screening Complete  Transportation Screening Complete  Some recent data might be hidden

## 2021-01-21 NOTE — [place]2 (Signed)
[place] MEDICAID FL2 LEVEL OF CARE SCREENING TOOL     IDENTIFICATION  Patient Name: Jason Cox Birthdate: [birth year] Sex: male Admission Date (Current Location): 01/18/2021  [facility] and [place] Number:  [employer] and Address:  [facility],  [address], [place] [address]      Provider Number: [id number]  Attending Physician Name and Address:  Catarina Hartshorn, MD  Relative Name and Phone Number:  Sten Dematteo - brother  [phone]    Current Level of Care: Hospital Recommended Level of Care: Skilled Nursing Facility Prior Approval Number:    Date Approved/Denied:   PASRR Number: [id number]  Discharge Plan: SNF    Current Diagnoses: Patient Active Problem List   Diagnosis Date Noted   Acute cystitis without hematuria    Hyperlipidemia    Acute stroke due to ischemia (HCC) 11/17/2020   Essential hypertension 11/17/2020   Diabetes mellitus type 2 in nonobese (HCC) 11/17/2020   Acute lower UTI 11/17/2020   Mild protein-calorie malnutrition (HCC) 11/17/2020   Thyroid disease 11/17/2020    Orientation RESPIRATION BLADDER Height & Weight     Self, Time, Situation, Place  Normal Continent Weight: 78.5 kg Height:  5\' 11"  (180.3 cm)  BEHAVIORAL SYMPTOMS/MOOD NEUROLOGICAL BOWEL NUTRITION STATUS      Continent Diet (See DC summary)  AMBULATORY STATUS COMMUNICATION OF NEEDS Skin   Extensive Assist Verbally Normal                       Personal Care Assistance Level of Assistance  Bathing, Feeding, Dressing Bathing Assistance: Maximum assistance Feeding assistance: Independent Dressing Assistance: Maximum assistance     Functional Limitations Info  Sight, Hearing, Speech Sight Info: Adequate Hearing Info: Adequate      SPECIAL CARE FACTORS FREQUENCY  PT (By licensed PT)     PT Frequency: 5 times a week              Contractures Contractures Info: Not present    Additional Factors Info  Code Status, Allergies  Code Status Info: FULL Allergies Info: NKDA           Current Medications (01/21/2021):  This is the current hospital active medication list Current Facility-Administered Medications  Medication Dose Route Frequency Provider Last Rate Last Admin   0.9 %  sodium chloride infusion   Intravenous Continuous Tat, 01/23/2021, MD       acetaminophen (TYLENOL) tablet 650 mg  650 mg Oral Q6H PRN Emokpae, Ejiroghene E, MD       Or   acetaminophen (TYLENOL) suppository 650 mg  650 mg Rectal Q6H PRN Emokpae, Ejiroghene E, MD       aspirin EC tablet 81 mg  81 mg Oral Daily Emokpae, Ejiroghene E, MD   81 mg at 01/21/21 0831   [START ON 01/22/2021] atorvastatin (LIPITOR) tablet 80 mg  80 mg Oral QPM Tat, David, MD       ceFAZolin (ANCEF) IVPB 2g/100 mL premix  2 g Intravenous 01/24/2021, MD 200 mL/hr at 01/21/21 0553 2 g at 01/21/21 0553   clopidogrel (PLAVIX) tablet 75 mg  75 mg Oral Daily Tat, David, MD   75 mg at 01/21/21 0831   enoxaparin (LOVENOX) injection 40 mg  40 mg Subcutaneous Daily Tat, David, MD       insulin aspart (novoLOG) injection 0-15 Units  0-15 Units Subcutaneous TID WC Emokpae, Ejiroghene E, MD   8 Units at 01/21/21 0832   insulin aspart (  novoLOG) injection 0-5 Units  0-5 Units Subcutaneous QHS Emokpae, Ejiroghene E, MD   2 Units at 01/18/21 2206   levothyroxine (SYNTHROID) tablet 75 mcg  75 mcg Oral QAC breakfast Emokpae, Ejiroghene E, MD   75 mcg at 01/21/21 0548   magnesium sulfate IVPB 2 g 50 mL  2 g Intravenous Once Tat, David, MD       ondansetron [facility]) tablet 4 mg  4 mg Oral Q6H PRN Emokpae, Ejiroghene E, MD       Or   ondansetron (ZOFRAN) injection 4 mg  4 mg Intravenous Q6H PRN Emokpae, Ejiroghene E, MD   4 mg at 01/21/21 0815   pantoprazole (PROTONIX) injection 40 mg  40 mg Intravenous Q12H Tat, Onalee Hua, MD       polyethylene glycol (MIRALAX / GLYCOLAX) packet 17 g  17 g Oral Daily PRN Emokpae, Ejiroghene E, MD         Discharge Medications: Please see discharge summary  for a list of discharge medications.  Relevant Imaging Results:  Relevant Lab Results:   Additional Information SS# [id number]  Leitha Bleak, RN

## 2021-01-22 ENCOUNTER — Inpatient Hospital Stay
Admission: RE | Admit: 2021-01-22 | Discharge: 2021-01-27 | Disposition: A | Discharge status: Still In Hospital | Payer: Medicare Other | Source: Ambulatory Visit | Attending: Internal Medicine | Admitting: Internal Medicine

## 2021-01-22 DIAGNOSIS — M159 Polyosteoarthritis, unspecified: Secondary | ICD-10-CM | POA: Diagnosis not present

## 2021-01-22 DIAGNOSIS — I6611 Occlusion and stenosis of right anterior cerebral artery: Secondary | ICD-10-CM | POA: Diagnosis not present

## 2021-01-22 DIAGNOSIS — M6281 Muscle weakness (generalized): Secondary | ICD-10-CM | POA: Diagnosis not present

## 2021-01-22 DIAGNOSIS — R279 Unspecified lack of coordination: Secondary | ICD-10-CM | POA: Diagnosis not present

## 2021-01-22 DIAGNOSIS — R531 Weakness: Secondary | ICD-10-CM | POA: Diagnosis not present

## 2021-01-22 DIAGNOSIS — Z7984 Long term (current) use of oral hypoglycemic drugs: Secondary | ICD-10-CM | POA: Diagnosis not present

## 2021-01-22 DIAGNOSIS — R110 Nausea: Secondary | ICD-10-CM | POA: Diagnosis not present

## 2021-01-22 DIAGNOSIS — E669 Obesity, unspecified: Secondary | ICD-10-CM | POA: Diagnosis not present

## 2021-01-22 DIAGNOSIS — S2231XA Fracture of one rib, right side, initial encounter for closed fracture: Secondary | ICD-10-CM | POA: Diagnosis not present

## 2021-01-22 DIAGNOSIS — B9689 Other specified bacterial agents as the cause of diseases classified elsewhere: Secondary | ICD-10-CM | POA: Diagnosis not present

## 2021-01-22 DIAGNOSIS — I69351 Hemiplegia and hemiparesis following cerebral infarction affecting right dominant side: Secondary | ICD-10-CM | POA: Diagnosis not present

## 2021-01-22 DIAGNOSIS — E785 Hyperlipidemia, unspecified: Secondary | ICD-10-CM | POA: Diagnosis not present

## 2021-01-22 DIAGNOSIS — Z87891 Personal history of nicotine dependence: Secondary | ICD-10-CM | POA: Diagnosis not present

## 2021-01-22 DIAGNOSIS — E1159 Type 2 diabetes mellitus with other circulatory complications: Secondary | ICD-10-CM | POA: Diagnosis not present

## 2021-01-22 DIAGNOSIS — R2681 Unsteadiness on feet: Secondary | ICD-10-CM | POA: Diagnosis not present

## 2021-01-22 DIAGNOSIS — E039 Hypothyroidism, unspecified: Secondary | ICD-10-CM | POA: Diagnosis not present

## 2021-01-22 DIAGNOSIS — I69919 Unspecified symptoms and signs involving cognitive functions following unspecified cerebrovascular disease: Secondary | ICD-10-CM | POA: Diagnosis not present

## 2021-01-22 DIAGNOSIS — H547 Unspecified visual loss: Secondary | ICD-10-CM | POA: Diagnosis not present

## 2021-01-22 DIAGNOSIS — K219 Gastro-esophageal reflux disease without esophagitis: Secondary | ICD-10-CM | POA: Diagnosis not present

## 2021-01-22 DIAGNOSIS — B958 Unspecified staphylococcus as the cause of diseases classified elsewhere: Secondary | ICD-10-CM | POA: Diagnosis not present

## 2021-01-22 DIAGNOSIS — N179 Acute kidney failure, unspecified: Secondary | ICD-10-CM | POA: Diagnosis not present

## 2021-01-22 DIAGNOSIS — Z23 Encounter for immunization: Secondary | ICD-10-CM | POA: Diagnosis not present

## 2021-01-22 DIAGNOSIS — G463 Brain stem stroke syndrome: Secondary | ICD-10-CM | POA: Diagnosis not present

## 2021-01-22 DIAGNOSIS — Z9181 History of falling: Secondary | ICD-10-CM | POA: Diagnosis not present

## 2021-01-22 DIAGNOSIS — E1149 Type 2 diabetes mellitus with other diabetic neurological complication: Secondary | ICD-10-CM | POA: Diagnosis not present

## 2021-01-22 DIAGNOSIS — Z7982 Long term (current) use of aspirin: Secondary | ICD-10-CM | POA: Diagnosis not present

## 2021-01-22 DIAGNOSIS — E079 Disorder of thyroid, unspecified: Secondary | ICD-10-CM | POA: Diagnosis not present

## 2021-01-22 DIAGNOSIS — R1312 Dysphagia, oropharyngeal phase: Secondary | ICD-10-CM | POA: Diagnosis not present

## 2021-01-22 DIAGNOSIS — Z79899 Other long term (current) drug therapy: Secondary | ICD-10-CM | POA: Diagnosis not present

## 2021-01-22 DIAGNOSIS — I6523 Occlusion and stenosis of bilateral carotid arteries: Secondary | ICD-10-CM | POA: Diagnosis not present

## 2021-01-22 DIAGNOSIS — Z7902 Long term (current) use of antithrombotics/antiplatelets: Secondary | ICD-10-CM | POA: Diagnosis not present

## 2021-01-22 DIAGNOSIS — I6602 Occlusion and stenosis of left middle cerebral artery: Secondary | ICD-10-CM | POA: Diagnosis not present

## 2021-01-22 DIAGNOSIS — R262 Difficulty in walking, not elsewhere classified: Secondary | ICD-10-CM | POA: Diagnosis not present

## 2021-01-22 DIAGNOSIS — R5381 Other malaise: Secondary | ICD-10-CM | POA: Diagnosis not present

## 2021-01-22 DIAGNOSIS — R112 Nausea with vomiting, unspecified: Secondary | ICD-10-CM | POA: Diagnosis not present

## 2021-01-22 DIAGNOSIS — R200 Anesthesia of skin: Secondary | ICD-10-CM | POA: Diagnosis not present

## 2021-01-22 DIAGNOSIS — I152 Hypertension secondary to endocrine disorders: Secondary | ICD-10-CM | POA: Diagnosis not present

## 2021-01-22 DIAGNOSIS — E875 Hyperkalemia: Secondary | ICD-10-CM | POA: Diagnosis not present

## 2021-01-22 DIAGNOSIS — M069 Rheumatoid arthritis, unspecified: Secondary | ICD-10-CM | POA: Diagnosis not present

## 2021-01-22 DIAGNOSIS — E441 Mild protein-calorie malnutrition: Secondary | ICD-10-CM | POA: Diagnosis not present

## 2021-01-22 DIAGNOSIS — E118 Type 2 diabetes mellitus with unspecified complications: Secondary | ICD-10-CM | POA: Diagnosis not present

## 2021-01-22 DIAGNOSIS — R41841 Cognitive communication deficit: Secondary | ICD-10-CM | POA: Diagnosis not present

## 2021-01-22 DIAGNOSIS — I700 Atherosclerosis of aorta: Secondary | ICD-10-CM | POA: Diagnosis not present

## 2021-01-22 DIAGNOSIS — N390 Urinary tract infection, site not specified: Secondary | ICD-10-CM | POA: Diagnosis not present

## 2021-01-22 DIAGNOSIS — I63541 Cerebral infarction due to unspecified occlusion or stenosis of right cerebellar artery: Secondary | ICD-10-CM | POA: Diagnosis not present

## 2021-01-22 DIAGNOSIS — R9389 Abnormal findings on diagnostic imaging of other specified body structures: Secondary | ICD-10-CM | POA: Diagnosis not present

## 2021-01-22 DIAGNOSIS — I10 Essential (primary) hypertension: Secondary | ICD-10-CM | POA: Diagnosis not present

## 2021-01-22 DIAGNOSIS — E1169 Type 2 diabetes mellitus with other specified complication: Secondary | ICD-10-CM | POA: Diagnosis not present

## 2021-01-22 DIAGNOSIS — I739 Peripheral vascular disease, unspecified: Secondary | ICD-10-CM | POA: Diagnosis not present

## 2021-01-22 DIAGNOSIS — I63331 Cerebral infarction due to thrombosis of right posterior cerebral artery: Secondary | ICD-10-CM | POA: Diagnosis not present

## 2021-01-22 DIAGNOSIS — E782 Mixed hyperlipidemia: Secondary | ICD-10-CM | POA: Diagnosis not present

## 2021-01-22 DIAGNOSIS — J9811 Atelectasis: Secondary | ICD-10-CM | POA: Diagnosis not present

## 2021-01-22 DIAGNOSIS — I69952 Hemiplegia and hemiparesis following unspecified cerebrovascular disease affecting left dominant side: Secondary | ICD-10-CM | POA: Diagnosis not present

## 2021-01-22 DIAGNOSIS — K5909 Other constipation: Secondary | ICD-10-CM | POA: Diagnosis not present

## 2021-01-22 DIAGNOSIS — I69359 Hemiplegia and hemiparesis following cerebral infarction affecting unspecified side: Secondary | ICD-10-CM | POA: Diagnosis not present

## 2021-01-22 DIAGNOSIS — I639 Cerebral infarction, unspecified: Secondary | ICD-10-CM | POA: Diagnosis not present

## 2021-01-22 DIAGNOSIS — R29898 Other symptoms and signs involving the musculoskeletal system: Secondary | ICD-10-CM | POA: Diagnosis not present

## 2021-01-22 DIAGNOSIS — R5383 Other fatigue: Secondary | ICD-10-CM | POA: Diagnosis not present

## 2021-01-22 LAB — BASIC METABOLIC PANEL
Anion gap: 8 (ref 5–15)
BUN: 33 mg/dL — ABNORMAL HIGH (ref 8–23)
CO2: 23 mmol/L (ref 22–32)
Calcium: 9 mg/dL (ref 8.9–10.3)
Chloride: 105 mmol/L (ref 98–111)
Creatinine, Ser: 1.21 mg/dL (ref 0.61–1.24)
GFR, Estimated: >60 mL/min (ref >60)
Glucose, Bld: 211 mg/dL — ABNORMAL HIGH (ref 70–99)
Potassium: 4.7 mmol/L (ref 3.5–5.1)
Sodium: 136 mmol/L (ref 135–145)

## 2021-01-22 LAB — CBC
HCT: 39.6 % (ref 39.0–52.0)
Hemoglobin: 12.9 g/dL — ABNORMAL LOW (ref 13.0–17.0)
MCH: 31.7 pg (ref 26.0–34.0)
MCHC: 32.6 g/dL (ref 30.0–36.0)
MCV: 97.3 fL (ref 80.0–100.0)
Platelets: 397 10*3/uL (ref 150–400)
RBC: 4.07 MIL/uL — ABNORMAL LOW (ref 4.22–5.81)
RDW: 14.1 % (ref 11.5–15.5)
WBC: 11.4 10*3/uL — ABNORMAL HIGH (ref 4.0–10.5)
nRBC: 0 % (ref 0.0–0.2)

## 2021-01-22 LAB — MAGNESIUM: Magnesium: 2.2 mg/dL (ref 1.7–2.4)

## 2021-01-22 LAB — GLUCOSE, CAPILLARY
Glucose-Capillary: 202 mg/dL — ABNORMAL HIGH (ref 70–99)
Glucose-Capillary: 166 mg/dL — ABNORMAL HIGH (ref 70–99)

## 2021-01-22 MED ORDER — GLIPIZIDE 5 MG PO TABS: 5.0000 mg | ORAL_TABLET | Freq: Every day | ORAL | 1 refills | Status: DC

## 2021-01-22 MED ORDER — CEPHALEXIN 500 MG PO CAPS
500.0000 mg | ORAL_CAPSULE | Freq: Three times a day (TID) | ORAL | Status: DC
  Administered 2021-01-22: 500 mg via ORAL
  Filled 2021-01-22: qty 1

## 2021-01-22 MED ORDER — ATORVASTATIN CALCIUM 80 MG PO TABS: 80.0000 mg | ORAL_TABLET | Freq: Every evening | ORAL | Status: DC

## 2021-01-22 MED ORDER — CEPHALEXIN 500 MG PO CAPS: 500.0000 mg | ORAL_CAPSULE | Freq: Three times a day (TID) | ORAL | Status: DC

## 2021-01-22 NOTE — Discharge Summary (Signed)
Physician Discharge Summary  Jason Cox ZOX:[id number] DOB: 1945/07/08 DOA: 01/18/2021  PCP: Elfredia Nevins, MD  Admit date: 01/18/2021 Discharge date: 01/22/2021  Admitted From: Home Disposition:  SNF ([facility])  Recommendations for Outpatient Follow-up:  Follow up with PCP in 1-2 weeks Please obtain BMP/CBC in one week    Discharge Condition: Stable CODE STATUS:FULL Diet recommendation: Heart Healthy / Carb Modified    Brief/Interim Summary:  As per H&P written by Dr. Mariea Clonts on 01/18/2021 Jason Cox is a 75 y.o. male with medical history significant for hypertension, diabetes, recent acute CVA. Presented to ED with complaints of right-sided weakness involving his right lower extremity.  He reports numbness involving his right upper extremity.  No facial asymmetry no change in vision no difficulty swallowing no abnormal sensation involving any other parts of his body.  Patient reports 2 falls today due to weakness in his right lower extremity. Patient reports he has been compliant with aspirin daily, but is not familiar with the name Plavix/clopidogrel and does not think he has been taking it, does not think he has been taking a cholesterol medication either.   Hospitalization 5/24 - 5/25-presented with dizziness, head CT showed acute infarct in the right inferior cerebellum consistent with right PICA territory stroke.  Patient completed stroke work-up to include MRA head, carotid Dopplers and echocardiogram.  Neurology was consulted and plan was for patient to be discharge to complete 1 month of dual antiplatelets and then continue with aspirin daily.   Also diagnosed with urinary tract infection- cultures grew staph aureus was treated with Rocephin and discharged on Keflex.  Discharge Diagnoses:  Acute stroke due to ischemia [facility]) -2D echo not demonstrating significant abnormalities -Neurology consult appreciated>>DAPT x 3-6 months -Given multiple infarcts and  locations affected on MRI he might likely require a TEE -Continue aspirin and Plavix--pt states he was only took ASA after his May 2022 discharge -Continue risk factor modification. -MR brain--acute infarct posterior R-medulla -MRA brain--no LVO;  severe stenosis bilateral PCAs -11/18/20 carotid US--no hemodynamically significant stenosis -PT eval-->SNF with which patient agrees   Essential hypertension -Stable currently -Allowing permissive hypertension. -holding metoprolol succinate, cardizem and losartan   Dysrhythmia -7/28--personally reviewed EKG--sinus, first degree AVB, RBBB -optimize K and mag   Diabetes mellitus type 2, uncontrolled with hyperglycemia -Continue the use of a sliding scale insulin -A1c 8.6 -Holding oral hypoglycemic agents while inpatient. -7/28--2 episodes n/v-->start PPI -started IVF x 24 hours>>n/v improved and tolerating diet at time of discharge -restart metformin after d/c -add glipizide after d/c   Acute lower UTI -Complaining of dysuria -Follow urine cultures>>prelim = staph aureus (MSSA) -change ceftriaxone to cefazolin -d/c with cephalexin x 5 more days   hyperlipidemia -Continue statins. -LDL 150 -increased lipitor to 80 mg   hypothyroidism -Continue Synthroid.   Physical deconditioning -With ongoing poor balance and right-sided weakness -Appreciate evaluation by PT>>SNF -Skilled nursing facility has been recommended at this point. -pt initially refused SNF, now agrees           Discharge Instructions   Allergies as of 01/22/2021   No Known Allergies      Medication List     STOP taking these medications    amLODipine 10 MG tablet Commonly known as: NORVASC   diltiazem 240 MG 24 hr capsule Commonly known as: CARDIZEM CD   diltiazem 360 MG 24 hr capsule Commonly known as: Cardizem CD   indomethacin 25 MG capsule Commonly known as: INDOCIN   losartan 100 MG tablet Commonly known  as: COZAAR   metoprolol  succinate 50 MG 24 hr tablet Commonly known as: TOPROL-XL       TAKE these medications    aspirin EC 81 MG tablet Take 1 tablet (81 mg total) by mouth daily. Swallow whole.   atorvastatin 80 MG tablet Commonly known as: Lipitor Take 1 tablet (80 mg total) by mouth every evening. What changed:  medication strength how much to take when to take this   cephALEXin 500 MG capsule Commonly known as: KEFLEX Take 1 capsule (500 mg total) by mouth every 8 (eight) hours. X 5 days   clopidogrel 75 MG tablet Commonly known as: Plavix Take 1 tablet (75 mg total) by mouth daily.   FISH OIL PO Take 2,500 mg by mouth in the morning and at bedtime.   glipiZIDE 5 MG tablet Commonly known as: GLUCOTROL Take 1 tablet (5 mg total) by mouth daily before breakfast.   levothyroxine 75 MCG tablet Commonly known as: SYNTHROID Take 75 mcg by mouth daily before breakfast.   metFORMIN 500 MG 24 hr tablet Commonly known as: GLUCOPHAGE-XR Take 1,000 mg by mouth in the morning and at bedtime.   pantoprazole 40 MG tablet Commonly known as: PROTONIX Take 1 tablet (40 mg total) by mouth daily.   vitamin C 100 MG tablet Take 100 mg by mouth daily.   Vitamin D3 10 MCG (400 UNIT) Caps Take 1 capsule by mouth daily.        Contact information for follow-up providers     [facility]-Home Follow up.   Specialty: Home Health Services Why: RN/PT will call to schedule your first home visit.             Contact information for after-discharge care     Destination     [facility] Preferred SNF .   Service: Skilled Nursing Contact information: [address] [place] [address] [phone]                    No Known Allergies  Consultations: neurology   Procedures/Studies: CT HEAD WO CONTRAST  Result Date: 01/18/2021 CLINICAL DATA:  Right-sided weakness. EXAM: CT HEAD WITHOUT CONTRAST TECHNIQUE: Contiguous axial images  were obtained from the base of the skull through the vertex without intravenous contrast. COMPARISON:  Nov 17, 2020. FINDINGS: Brain: Mild chronic ischemic white matter disease is noted. Mild diffuse cortical atrophy is noted. No mass effect or midline shift is noted. Ventricular size is within normal limits. There is no evidence of mass lesion, hemorrhage or acute infarction. Vascular: No hyperdense vessel or unexpected calcification. Skull: Normal. Negative for fracture or focal lesion. Sinuses/Orbits: No acute finding. Other: None. IMPRESSION: No acute intracranial abnormality seen. Electronically Signed   By: Lupita Raider M.D.   On: 01/18/2021 13:59   MR ANGIO HEAD WO CONTRAST  Result Date: 01/18/2021 CLINICAL DATA:  Neuro deficit, acute stroke suspected. EXAM: MRI HEAD WITHOUT CONTRAST MRA HEAD WITHOUT CONTRAST TECHNIQUE: Multiplanar, multi-echo pulse sequences of the brain and surrounding structures were acquired without intravenous contrast. Angiographic images of the Circle of Willis were acquired using MRA technique without intravenous contrast. COMPARISON:  MRI/MRA Nov 17, 2020. FINDINGS: MRI HEAD FINDINGS Brain: Acute infarct in the posterolateral right medulla (see series 5, image 4). Associated mild edema without mass effect. Additional acute right PCA territory infarcts in the right occipital lobe and posterior right corpus callosum. Developing encephalomalacia in the areas of previously seen infarct in the  inferior right cerebellum. Remote lacunar infarcts in the left thalamus and bilateral corona radiata and right caudate. Additional moderate scattered T2/FLAIR hyperintensities within the white matter, compatible with chronic microvascular ischemic disease. Vascular: See below. Skull and upper cervical spine: Normal marrow signal. Degenerative change of the cervical spine, partially imaged. Sinuses/Orbits: Sinuses are largely clear.  Unremarkable orbits. Other: No sizable mastoid effusion. MRA  HEAD FINDINGS Anterior circulation: Bilateral ICAs are patent. Severe stenosis of the distal left M1 MCA. Severe stenosis of bilateral M2 MCA branches. Bilateral A1 ACAs are patent. Multifocal severe stenosis of the right A2 ACA and severe stenosis of the proximal left A2 ACA. Posterior circulation: Similar multifocal moderate right Peyton Najjar of the visualized intradural vertebral artery. The more proximal V3 vertebral artery is not imaged on this study. The proximal right PICA is patent with no flow related signal in the more distal PICA, similar to prior. The intradural left vertebral artery and the basilar artery are patent. Severe stenosis of bilateral P2 PCAs with poor flow related signal in the more distal PCAs. IMPRESSION: MRI: 1. Acute infarct in the posterolateral right medulla, likely right PICA territory. Additional acute right PCA territory infarcts in the right occipital lobe and posterior right corpus callosum. Associated mild edema without mass effect. 2. Developing encephalomalacia in the areas of previously seen infarct in the inferior right cerebellum. 3. Remote lacunar infarcts in the left thalamus, right caudate, and bilateral corona radiata. 4. Moderate chronic microvascular disease. MRA: 1. Severe stenosis of bilateral P2 PCAs with poor flow related signal in the more distal PCAs. Also, severe stenosis of the distal left M1 MCA, bilateral M2 MCA branches and bilateral A2 ACAs. 2. Similar multifocal moderate narrowing of the distal right intradural vertebral artery. Similar flow related signal in the proximal right PICA, but not the more distal right PICA. Electronically Signed   By: Feliberto Harts MD   On: 01/18/2021 16:24   MR Brain Wo Contrast (neuro protocol)  Result Date: 01/18/2021 CLINICAL DATA:  Neuro deficit, acute stroke suspected. EXAM: MRI HEAD WITHOUT CONTRAST MRA HEAD WITHOUT CONTRAST TECHNIQUE: Multiplanar, multi-echo pulse sequences of the brain and surrounding structures were  acquired without intravenous contrast. Angiographic images of the Circle of Willis were acquired using MRA technique without intravenous contrast. COMPARISON:  MRI/MRA Nov 17, 2020. FINDINGS: MRI HEAD FINDINGS Brain: Acute infarct in the posterolateral right medulla (see series 5, image 4). Associated mild edema without mass effect. Additional acute right PCA territory infarcts in the right occipital lobe and posterior right corpus callosum. Developing encephalomalacia in the areas of previously seen infarct in the inferior right cerebellum. Remote lacunar infarcts in the left thalamus and bilateral corona radiata and right caudate. Additional moderate scattered T2/FLAIR hyperintensities within the white matter, compatible with chronic microvascular ischemic disease. Vascular: See below. Skull and upper cervical spine: Normal marrow signal. Degenerative change of the cervical spine, partially imaged. Sinuses/Orbits: Sinuses are largely clear.  Unremarkable orbits. Other: No sizable mastoid effusion. MRA HEAD FINDINGS Anterior circulation: Bilateral ICAs are patent. Severe stenosis of the distal left M1 MCA. Severe stenosis of bilateral M2 MCA branches. Bilateral A1 ACAs are patent. Multifocal severe stenosis of the right A2 ACA and severe stenosis of the proximal left A2 ACA. Posterior circulation: Similar multifocal moderate right Peyton Najjar of the visualized intradural vertebral artery. The more proximal V3 vertebral artery is not imaged on this study. The proximal right PICA is patent with no flow related signal in the more distal PICA, similar to prior.  The intradural left vertebral artery and the basilar artery are patent. Severe stenosis of bilateral P2 PCAs with poor flow related signal in the more distal PCAs. IMPRESSION: MRI: 1. Acute infarct in the posterolateral right medulla, likely right PICA territory. Additional acute right PCA territory infarcts in the right occipital lobe and posterior right corpus  callosum. Associated mild edema without mass effect. 2. Developing encephalomalacia in the areas of previously seen infarct in the inferior right cerebellum. 3. Remote lacunar infarcts in the left thalamus, right caudate, and bilateral corona radiata. 4. Moderate chronic microvascular disease. MRA: 1. Severe stenosis of bilateral P2 PCAs with poor flow related signal in the more distal PCAs. Also, severe stenosis of the distal left M1 MCA, bilateral M2 MCA branches and bilateral A2 ACAs. 2. Similar multifocal moderate narrowing of the distal right intradural vertebral artery. Similar flow related signal in the proximal right PICA, but not the more distal right PICA. Electronically Signed   By: Feliberto Harts MD   On: 01/18/2021 16:24   ECHOCARDIOGRAM COMPLETE  Result Date: 01/19/2021    ECHOCARDIOGRAM REPORT   Patient Name:   Jason Cox Date of Exam: 01/19/2021 Medical Rec #:  [id number]       Height:       71.0 in Accession #:    [id number]      Weight:       173.0 lb Date of Birth:  June 25, 1946        BSA:          1.983 m Patient Age:    75 years        BP:           175/88 mmHg Patient Gender: M               HR:           76 bpm. [facility] Procedure: 2D Echo, Cardiac Doppler and Color Doppler Indications:    CVA  History:        Patient has prior history of Echocardiogram examinations, most                 recent 11/18/2020. CAD, Prior CABG, Stroke and PVD; Risk                 Factors:Hypertension and Diabetes.  Sonographer:    Lavenia Atlas RDCS Referring Phys: [id number] [facility] IMPRESSIONS  1. Left ventricular ejection fraction, by estimation, is 70 to 75%. The left ventricle has hyperdynamic function. The left ventricle has no regional wall motion abnormalities. There is mild left ventricular hypertrophy. Left ventricular diastolic parameters are indeterminate.  2. Right ventricular systolic function is normal. The right ventricular size is normal. Tricuspid regurgitation  signal is inadequate for assessing PA pressure.  3. The mitral valve is normal in structure. No evidence of mitral valve regurgitation. No evidence of mitral stenosis.  4. The aortic valve is tricuspid. Aortic valve regurgitation is not visualized. No aortic stenosis is present. FINDINGS  Left Ventricle: Left ventricular ejection fraction, by estimation, is 70 to 75%. The left ventricle has hyperdynamic function. The left ventricle has no regional wall motion abnormalities. The left ventricular internal cavity size was normal in size. There is mild left ventricular hypertrophy. Left ventricular diastolic parameters are indeterminate. Right Ventricle: The right ventricular size is normal. No increase in right ventricular wall thickness. Right ventricular systolic function is normal. Tricuspid regurgitation signal is inadequate for assessing PA pressure. Left Atrium: Left  atrial size was normal in size. Right Atrium: Right atrial size was normal in size. Pericardium: There is no evidence of pericardial effusion. Mitral Valve: The mitral valve is normal in structure. No evidence of mitral valve regurgitation. No evidence of mitral valve stenosis. Tricuspid Valve: The tricuspid valve is normal in structure. Tricuspid valve regurgitation is not demonstrated. No evidence of tricuspid stenosis. Aortic Valve: The aortic valve is tricuspid. Aortic valve regurgitation is not visualized. No aortic stenosis is present. Aortic valve mean gradient measures 3.1 mmHg. Aortic valve peak gradient measures 5.4 mmHg. Aortic valve area, by VTI measures 2.62 cm. Pulmonic Valve: The pulmonic valve was not well visualized. Pulmonic valve regurgitation is not visualized. No evidence of pulmonic stenosis. Aorta: The aortic root is normal in size and structure. IAS/Shunts: The interatrial septum was not well visualized.  LEFT VENTRICLE PLAX 2D LVIDd:         3.97 cm  Diastology LVIDs:         2.14 cm  LV e' medial:    5.34 cm/s LV PW:          1.28 cm  LV E/e' medial:  12.7 LV IVS:        1.16 cm  LV e' lateral:   9.37 cm/s LVOT diam:     2.10 cm  LV E/e' lateral: 7.2 LV SV:         58 LV SV Index:   29 LVOT Area:     3.46 cm  RIGHT VENTRICLE RV Basal diam:  3.05 cm RV S prime:     7.80 cm/s TAPSE (M-mode): 1.5 cm LEFT ATRIUM             Index       RIGHT ATRIUM           Index LA diam:        3.40 cm 1.71 cm/m  RA Area:     14.40 cm LA Vol (A2C):   36.8 ml 18.56 ml/m RA Volume:   35.00 ml  17.65 ml/m LA Vol (A4C):   54.4 ml 27.44 ml/m LA Biplane Vol: 47.3 ml 23.86 ml/m  AORTIC VALVE AV Area (Vmax):    2.54 cm AV Area (Vmean):   2.50 cm AV Area (VTI):     2.62 cm AV Vmax:           116.19 cm/s AV Vmean:          80.913 cm/s AV VTI:            0.222 m AV Peak Grad:      5.4 mmHg AV Mean Grad:      3.1 mmHg LVOT Vmax:         85.20 cm/s LVOT Vmean:        58.400 cm/s LVOT VTI:          0.168 m LVOT/AV VTI ratio: 0.76  AORTA Ao Root diam: 3.60 cm MITRAL VALVE MV Area (PHT): 6.96 cm     SHUNTS MV Decel Time: 109 msec     Systemic VTI:  0.17 m MV E velocity: 67.70 cm/s   Systemic Diam: 2.10 cm MV A velocity: 108.00 cm/s MV E/A ratio:  0.63 Dina Rich MD Electronically signed by Dina Rich MD Signature Date/Time: 01/19/2021/9:34:14 AM    Final         Discharge Exam: Vitals:   01/21/21 1914 01/22/21 0329  BP: 115/72 114/70  Pulse: 85 78  Resp: 20 19  Temp: 97.7  F (36.5 C) 98 F (36.7 C)  SpO2: 97% 96%   Vitals:   01/21/21 0819 01/21/21 1307 01/21/21 1914 01/22/21 0329  BP: (!) 168/77 (!) 112/52 115/72 114/70  Pulse: 87 90 85 78  Resp:  18 20 19   Temp: 98.1 F (36.7 C) 97.6 F (36.4 C) 97.7 F (36.5 C) 98 F (36.7 C)  TempSrc: Oral Oral Oral   SpO2: 98% 100% 97% 96%  Weight:      Height:        General: Pt is alert, awake, not in acute distress Cardiovascular: RRR, S1/S2 +, no rubs, no gallops Respiratory: bibasilar crackles.  no wheezing, no rhonchi Abdominal: Soft, NT, ND, bowel sounds + Extremities:  no edema, no cyanosis   The results of significant diagnostics from this hospitalization (including imaging, microbiology, ancillary and laboratory) are listed below for reference.    Significant Diagnostic Studies: CT HEAD WO CONTRAST  Result Date: 01/18/2021 CLINICAL DATA:  Right-sided weakness. EXAM: CT HEAD WITHOUT CONTRAST TECHNIQUE: Contiguous axial images were obtained from the base of the skull through the vertex without intravenous contrast. COMPARISON:  Nov 17, 2020. FINDINGS: Brain: Mild chronic ischemic white matter disease is noted. Mild diffuse cortical atrophy is noted. No mass effect or midline shift is noted. Ventricular size is within normal limits. There is no evidence of mass lesion, hemorrhage or acute infarction. Vascular: No hyperdense vessel or unexpected calcification. Skull: Normal. Negative for fracture or focal lesion. Sinuses/Orbits: No acute finding. Other: None. IMPRESSION: No acute intracranial abnormality seen. Electronically Signed   By: Lupita Raider M.D.   On: 01/18/2021 13:59   MR ANGIO HEAD WO CONTRAST  Result Date: 01/18/2021 CLINICAL DATA:  Neuro deficit, acute stroke suspected. EXAM: MRI HEAD WITHOUT CONTRAST MRA HEAD WITHOUT CONTRAST TECHNIQUE: Multiplanar, multi-echo pulse sequences of the brain and surrounding structures were acquired without intravenous contrast. Angiographic images of the Circle of Willis were acquired using MRA technique without intravenous contrast. COMPARISON:  MRI/MRA Nov 17, 2020. FINDINGS: MRI HEAD FINDINGS Brain: Acute infarct in the posterolateral right medulla (see series 5, image 4). Associated mild edema without mass effect. Additional acute right PCA territory infarcts in the right occipital lobe and posterior right corpus callosum. Developing encephalomalacia in the areas of previously seen infarct in the inferior right cerebellum. Remote lacunar infarcts in the left thalamus and bilateral corona radiata and right caudate.  Additional moderate scattered T2/FLAIR hyperintensities within the white matter, compatible with chronic microvascular ischemic disease. Vascular: See below. Skull and upper cervical spine: Normal marrow signal. Degenerative change of the cervical spine, partially imaged. Sinuses/Orbits: Sinuses are largely clear.  Unremarkable orbits. Other: No sizable mastoid effusion. MRA HEAD FINDINGS Anterior circulation: Bilateral ICAs are patent. Severe stenosis of the distal left M1 MCA. Severe stenosis of bilateral M2 MCA branches. Bilateral A1 ACAs are patent. Multifocal severe stenosis of the right A2 ACA and severe stenosis of the proximal left A2 ACA. Posterior circulation: Similar multifocal moderate right Peyton Najjar of the visualized intradural vertebral artery. The more proximal V3 vertebral artery is not imaged on this study. The proximal right PICA is patent with no flow related signal in the more distal PICA, similar to prior. The intradural left vertebral artery and the basilar artery are patent. Severe stenosis of bilateral P2 PCAs with poor flow related signal in the more distal PCAs. IMPRESSION: MRI: 1. Acute infarct in the posterolateral right medulla, likely right PICA territory. Additional acute right PCA territory infarcts in the right occipital lobe  and posterior right corpus callosum. Associated mild edema without mass effect. 2. Developing encephalomalacia in the areas of previously seen infarct in the inferior right cerebellum. 3. Remote lacunar infarcts in the left thalamus, right caudate, and bilateral corona radiata. 4. Moderate chronic microvascular disease. MRA: 1. Severe stenosis of bilateral P2 PCAs with poor flow related signal in the more distal PCAs. Also, severe stenosis of the distal left M1 MCA, bilateral M2 MCA branches and bilateral A2 ACAs. 2. Similar multifocal moderate narrowing of the distal right intradural vertebral artery. Similar flow related signal in the proximal right PICA, but not  the more distal right PICA. Electronically Signed   By: Feliberto Harts MD   On: 01/18/2021 16:24   MR Brain Wo Contrast (neuro protocol)  Result Date: 01/18/2021 CLINICAL DATA:  Neuro deficit, acute stroke suspected. EXAM: MRI HEAD WITHOUT CONTRAST MRA HEAD WITHOUT CONTRAST TECHNIQUE: Multiplanar, multi-echo pulse sequences of the brain and surrounding structures were acquired without intravenous contrast. Angiographic images of the Circle of Willis were acquired using MRA technique without intravenous contrast. COMPARISON:  MRI/MRA Nov 17, 2020. FINDINGS: MRI HEAD FINDINGS Brain: Acute infarct in the posterolateral right medulla (see series 5, image 4). Associated mild edema without mass effect. Additional acute right PCA territory infarcts in the right occipital lobe and posterior right corpus callosum. Developing encephalomalacia in the areas of previously seen infarct in the inferior right cerebellum. Remote lacunar infarcts in the left thalamus and bilateral corona radiata and right caudate. Additional moderate scattered T2/FLAIR hyperintensities within the white matter, compatible with chronic microvascular ischemic disease. Vascular: See below. Skull and upper cervical spine: Normal marrow signal. Degenerative change of the cervical spine, partially imaged. Sinuses/Orbits: Sinuses are largely clear.  Unremarkable orbits. Other: No sizable mastoid effusion. MRA HEAD FINDINGS Anterior circulation: Bilateral ICAs are patent. Severe stenosis of the distal left M1 MCA. Severe stenosis of bilateral M2 MCA branches. Bilateral A1 ACAs are patent. Multifocal severe stenosis of the right A2 ACA and severe stenosis of the proximal left A2 ACA. Posterior circulation: Similar multifocal moderate right Peyton Najjar of the visualized intradural vertebral artery. The more proximal V3 vertebral artery is not imaged on this study. The proximal right PICA is patent with no flow related signal in the more distal PICA, similar to  prior. The intradural left vertebral artery and the basilar artery are patent. Severe stenosis of bilateral P2 PCAs with poor flow related signal in the more distal PCAs. IMPRESSION: MRI: 1. Acute infarct in the posterolateral right medulla, likely right PICA territory. Additional acute right PCA territory infarcts in the right occipital lobe and posterior right corpus callosum. Associated mild edema without mass effect. 2. Developing encephalomalacia in the areas of previously seen infarct in the inferior right cerebellum. 3. Remote lacunar infarcts in the left thalamus, right caudate, and bilateral corona radiata. 4. Moderate chronic microvascular disease. MRA: 1. Severe stenosis of bilateral P2 PCAs with poor flow related signal in the more distal PCAs. Also, severe stenosis of the distal left M1 MCA, bilateral M2 MCA branches and bilateral A2 ACAs. 2. Similar multifocal moderate narrowing of the distal right intradural vertebral artery. Similar flow related signal in the proximal right PICA, but not the more distal right PICA. Electronically Signed   By: Feliberto Harts MD   On: 01/18/2021 16:24   ECHOCARDIOGRAM COMPLETE  Result Date: 01/19/2021    ECHOCARDIOGRAM REPORT   Patient Name:   Jason Cox Date of Exam: 01/19/2021 Medical Rec #:  [id number]  Height:       71.0 in Accession #:    [id number]      Weight:       173.0 lb Date of Birth:  26-Jun-1946        BSA:          1.983 m Patient Age:    75 years        BP:           175/88 mmHg Patient Gender: M               HR:           76 bpm. [facility] Procedure: 2D Echo, Cardiac Doppler and Color Doppler Indications:    CVA  History:        Patient has prior history of Echocardiogram examinations, most                 recent 11/18/2020. CAD, Prior CABG, Stroke and PVD; Risk                 Factors:Hypertension and Diabetes.  Sonographer:    Lavenia Atlas RDCS Referring Phys: [phone] [facility] IMPRESSIONS  1. Left  ventricular ejection fraction, by estimation, is 70 to 75%. The left ventricle has hyperdynamic function. The left ventricle has no regional wall motion abnormalities. There is mild left ventricular hypertrophy. Left ventricular diastolic parameters are indeterminate.  2. Right ventricular systolic function is normal. The right ventricular size is normal. Tricuspid regurgitation signal is inadequate for assessing PA pressure.  3. The mitral valve is normal in structure. No evidence of mitral valve regurgitation. No evidence of mitral stenosis.  4. The aortic valve is tricuspid. Aortic valve regurgitation is not visualized. No aortic stenosis is present. FINDINGS  Left Ventricle: Left ventricular ejection fraction, by estimation, is 70 to 75%. The left ventricle has hyperdynamic function. The left ventricle has no regional wall motion abnormalities. The left ventricular internal cavity size was normal in size. There is mild left ventricular hypertrophy. Left ventricular diastolic parameters are indeterminate. Right Ventricle: The right ventricular size is normal. No increase in right ventricular wall thickness. Right ventricular systolic function is normal. Tricuspid regurgitation signal is inadequate for assessing PA pressure. Left Atrium: Left atrial size was normal in size. Right Atrium: Right atrial size was normal in size. Pericardium: There is no evidence of pericardial effusion. Mitral Valve: The mitral valve is normal in structure. No evidence of mitral valve regurgitation. No evidence of mitral valve stenosis. Tricuspid Valve: The tricuspid valve is normal in structure. Tricuspid valve regurgitation is not demonstrated. No evidence of tricuspid stenosis. Aortic Valve: The aortic valve is tricuspid. Aortic valve regurgitation is not visualized. No aortic stenosis is present. Aortic valve mean gradient measures 3.1 mmHg. Aortic valve peak gradient measures 5.4 mmHg. Aortic valve area, by VTI measures 2.62 cm.  Pulmonic Valve: The pulmonic valve was not well visualized. Pulmonic valve regurgitation is not visualized. No evidence of pulmonic stenosis. Aorta: The aortic root is normal in size and structure. IAS/Shunts: The interatrial septum was not well visualized.  LEFT VENTRICLE PLAX 2D LVIDd:         3.97 cm  Diastology LVIDs:         2.14 cm  LV e' medial:    5.34 cm/s LV PW:         1.28 cm  LV E/e' medial:  12.7 LV IVS:        1.16 cm  LV e' lateral:   9.37 cm/s LVOT diam:     2.10 cm  LV E/e' lateral: 7.2 LV SV:         58 LV SV Index:   29 LVOT Area:     3.46 cm  RIGHT VENTRICLE RV Basal diam:  3.05 cm RV S prime:     7.80 cm/s TAPSE (M-mode): 1.5 cm LEFT ATRIUM             Index       RIGHT ATRIUM           Index LA diam:        3.40 cm 1.71 cm/m  RA Area:     14.40 cm LA Vol (A2C):   36.8 ml 18.56 ml/m RA Volume:   35.00 ml  17.65 ml/m LA Vol (A4C):   54.4 ml 27.44 ml/m LA Biplane Vol: 47.3 ml 23.86 ml/m  AORTIC VALVE AV Area (Vmax):    2.54 cm AV Area (Vmean):   2.50 cm AV Area (VTI):     2.62 cm AV Vmax:           116.19 cm/s AV Vmean:          80.913 cm/s AV VTI:            0.222 m AV Peak Grad:      5.4 mmHg AV Mean Grad:      3.1 mmHg LVOT Vmax:         85.20 cm/s LVOT Vmean:        58.400 cm/s LVOT VTI:          0.168 m LVOT/AV VTI ratio: 0.76  AORTA Ao Root diam: 3.60 cm MITRAL VALVE MV Area (PHT): 6.96 cm     SHUNTS MV Decel Time: 109 msec     Systemic VTI:  0.17 m MV E velocity: 67.70 cm/s   Systemic Diam: 2.10 cm MV A velocity: 108.00 cm/s MV E/A ratio:  0.63 Dina Rich MD Electronically signed by Dina Rich MD Signature Date/Time: 01/19/2021/9:34:14 AM    Final     Microbiology: Recent Results (from the past 240 hour(s))  Resp Panel by RT-PCR (Flu A&B, Covid) Nasopharyngeal Swab     Status: None   Collection Time: 01/18/21 11:56 AM   Specimen: Nasopharyngeal Swab; Nasopharyngeal(NP) swabs in vial transport medium  Result Value Ref Range Status   SARS Coronavirus 2 by RT PCR  NEGATIVE NEGATIVE Final    Comment: (NOTE) SARS-CoV-2 target nucleic acids are NOT DETECTED.  The SARS-CoV-2 RNA is generally detectable in upper respiratory specimens during the acute phase of infection. The lowest concentration of SARS-CoV-2 viral copies this assay can detect is 138 copies/mL. A negative result does not preclude SARS-Cov-2 infection and should not be used as the sole basis for treatment or other patient management decisions. A negative result may occur with  improper specimen collection/handling, submission of specimen other than nasopharyngeal swab, presence of viral mutation(s) within the areas targeted by this assay, and inadequate number of viral copies(<138 copies/mL). A negative result must be combined with clinical observations, patient history, and epidemiological information. The expected result is Negative.  Fact Sheet for Patients:  BloggerCourse.com  Fact Sheet for Healthcare Providers:  SeriousBroker.it  This test is no t yet approved or cleared by the [place] FDA and  has been authorized for detection and/or diagnosis of SARS-CoV-2 by FDA under an Emergency Use Authorization (EUA). This EUA will remain  in effect (meaning this  test can be used) for the duration of the COVID-19 declaration under Section 564(b)(1) of the Act, 21 U.S.C.section 360bbb-3(b)(1), unless the authorization is terminated  or revoked sooner.       Influenza A by PCR NEGATIVE NEGATIVE Final   Influenza B by PCR NEGATIVE NEGATIVE Final    Comment: (NOTE) The Xpert Xpress SARS-CoV-2/FLU/RSV plus assay is intended as an aid in the diagnosis of influenza from Nasopharyngeal swab specimens and should not be used as a sole basis for treatment. Nasal washings and aspirates are unacceptable for Xpert Xpress SARS-CoV-2/FLU/RSV testing.  Fact Sheet for Patients: BloggerCourse.com  Fact Sheet for  Healthcare Providers: SeriousBroker.it  This test is not yet approved or cleared by the [place] FDA and has been authorized for detection and/or diagnosis of SARS-CoV-2 by FDA under an Emergency Use Authorization (EUA). This EUA will remain in effect (meaning this test can be used) for the duration of the COVID-19 declaration under Section 564(b)(1) of the Act, 21 U.S.C. section 360bbb-3(b)(1), unless the authorization is terminated or revoked.  Performed at [facility], [address] [place], [place] [address]   Urine Culture     Status: Abnormal   Collection Time: 01/18/21  1:54 PM   Specimen: Urine, Clean Catch  Result Value Ref Range Status   Specimen Description   Final    URINE, CLEAN CATCH Performed at [facility], [address], [place], [place] [address]    Special Requests   Final    NONE Performed at [facility], [address], [place], [place] [address]    Culture >=100,000 COLONIES/mL STAPHYLOCOCCUS AUREUS (A)  Final   Report Status 01/21/2021 FINAL  Final   Organism ID, Bacteria STAPHYLOCOCCUS AUREUS (A)  Final      Susceptibility   Staphylococcus aureus - MIC*    CIPROFLOXACIN <=0.5 SENSITIVE Sensitive     GENTAMICIN <=0.5 SENSITIVE Sensitive     NITROFURANTOIN <=16 SENSITIVE Sensitive     OXACILLIN <=0.25 SENSITIVE Sensitive     TETRACYCLINE <=1 SENSITIVE Sensitive     VANCOMYCIN <=0.5 SENSITIVE Sensitive     TRIMETH/SULFA <=10 SENSITIVE Sensitive     CLINDAMYCIN <=0.25 SENSITIVE Sensitive     RIFAMPIN <=0.5 SENSITIVE Sensitive     Inducible Clindamycin NEGATIVE Sensitive     * >=100,000 COLONIES/mL STAPHYLOCOCCUS AUREUS  Resp Panel by RT-PCR (Flu A&B, Covid) Nasopharyngeal Swab     Status: None   Collection Time: 01/21/21 11:09 AM   Specimen: Nasopharyngeal Swab; Nasopharyngeal(NP) swabs in vial transport medium  Result Value Ref Range Status   SARS Coronavirus 2 by RT PCR NEGATIVE NEGATIVE Final    Comment:  (NOTE) SARS-CoV-2 target nucleic acids are NOT DETECTED.  The SARS-CoV-2 RNA is generally detectable in upper respiratory specimens during the acute phase of infection. The lowest concentration of SARS-CoV-2 viral copies this assay can detect is 138 copies/mL. A negative result does not preclude SARS-Cov-2 infection and should not be used as the sole basis for treatment or other patient management decisions. A negative result may occur with  improper specimen collection/handling, submission of specimen other than nasopharyngeal swab, presence of viral mutation(s) within the areas targeted by this assay, and inadequate number of viral copies(<138 copies/mL). A negative result must be combined with clinical observations, patient history, and epidemiological information. The expected result is Negative.  Fact Sheet for Patients:  BloggerCourse.com  Fact Sheet for Healthcare Providers:  SeriousBroker.it  This test is no t yet approved or cleared by the [place]  FDA and  has been authorized for detection and/or diagnosis of SARS-CoV-2 by FDA under an Emergency Use Authorization (EUA). This EUA will remain  in effect (meaning this test can be used) for the duration of the COVID-19 declaration under Section 564(b)(1) of the Act, 21 U.S.C.section 360bbb-3(b)(1), unless the authorization is terminated  or revoked sooner.       Influenza A by PCR NEGATIVE NEGATIVE Final   Influenza B by PCR NEGATIVE NEGATIVE Final    Comment: (NOTE) The Xpert Xpress SARS-CoV-2/FLU/RSV plus assay is intended as an aid in the diagnosis of influenza from Nasopharyngeal swab specimens and should not be used as a sole basis for treatment. Nasal washings and aspirates are unacceptable for Xpert Xpress SARS-CoV-2/FLU/RSV testing.  Fact Sheet for Patients: BloggerCourse.com  Fact Sheet for Healthcare  Providers: SeriousBroker.it  This test is not yet approved or cleared by the [place] FDA and has been authorized for detection and/or diagnosis of SARS-CoV-2 by FDA under an Emergency Use Authorization (EUA). This EUA will remain in effect (meaning this test can be used) for the duration of the COVID-19 declaration under Section 564(b)(1) of the Act, 21 U.S.C. section 360bbb-3(b)(1), unless the authorization is terminated or revoked.  Performed at [facility], [address], [place], [place] [address]      Labs: Basic Metabolic Panel: Recent Labs  Lab 01/18/21 1217 01/18/21 1245 01/20/21 0705 01/21/21 0424 01/22/21 0605  NA 137 140 137 137 136  K 4.9 5.2* 5.1 5.1 4.7  CL 106 107 104 102 105  CO2 23  --  GLUCOSE 260* 258* 207* 243* 211*  BUN 22 23 26* 31* 33*  CREATININE 1.19 1.10 1.22 1.25* 1.21  CALCIUM 9.6  --  9.1 9.5 9.0  MG  --   --   --  1.8 2.2   Liver Function Tests: Recent Labs  Lab 01/18/21 1217 01/21/21 0630  AST 12* 9*  ALT 16 11  ALKPHOS 161* 134*  BILITOT 0.5 0.7  PROT 8.2* 7.6  ALBUMIN 3.6 3.2*   No results for input(s): LIPASE, AMYLASE in the last 168 hours. No results for input(s): AMMONIA in the last 168 hours. CBC: Recent Labs  Lab 01/18/21 1217 01/18/21 1245 01/20/21 0705 01/21/21 0424 01/22/21 0605  WBC 13.5*  --  11.6* 13.5* 11.4*  NEUTROABS 11.5*  --   --   --   --   HGB 13.2 13.6 12.8* 13.6 12.9*  HCT 40.5 40.0 39.0 41.6 39.6  MCV 96.4  --  96.5 95.9 97.3  PLT 377  --  333 390 397   Cardiac Enzymes: No results for input(s): CKTOTAL, CKMB, CKMBINDEX, TROPONINI in the last 168 hours. BNP: Invalid input(s): POCBNP CBG: Recent Labs  Lab 01/21/21 1114 01/21/21 1611 01/21/21 1914 01/22/21 0736 01/22/21 1107  GLUCAP 209* 252* 218* 202* 166*    Time coordinating discharge:  36 minutes  Signed:  Catarina Hartshorn, DO [facility] Pager: [phone] 01/22/2021, 11:09 AM

## 2021-01-22 NOTE — Care Management Important Message (Signed)
Important Message  Patient Details  Name: Jason Cox MRN: [id number] Date of Birth: Dec 13, 1945   Medicare Important Message Given:  Yes     Corey Harold 01/22/2021, 11:48 AM

## 2021-01-22 NOTE — TOC Transition Note (Signed)
Transition of Care [facility]) - CM/SW Discharge Note   Patient Details  Name: Jason Cox MRN: [id number] Date of Birth: Nov 26, 1945  [facility] of [facility]) CM/SW Contact:  Leitha Bleak, RN Phone Number: 01/22/2021, 3:14 PM   Clinical Narrative:   Patient medically ready to discharge to [facility].  RN to call report.   Final next level of care: Skilled Nursing Facility Barriers to Discharge: Barriers Resolved   Patient Goals and CMS Choice Patient states their goals for this hospitalization and ongoing recovery are:: agreeable to SNF CMS Medicare.gov Compare Post Acute Care list provided to:: Patient Choice offered to / list presented to : Patient  Discharge Placement              Patient chooses bed at:  [facility]) Patient to be transferred to facility by: [facility] staff   Patient and family notified of of transfer: 01/22/21    Readmission Risk Interventions Readmission Risk Prevention Plan 01/19/2021  Medication Screening Complete  Transportation Screening Complete  Some recent data might be hidden

## 2021-01-25 ENCOUNTER — Non-Acute Institutional Stay (SKILLED_NURSING_FACILITY): Payer: Medicare Other | Admitting: Adult Health

## 2021-01-25 ENCOUNTER — Encounter: Payer: Self-pay | Admitting: Adult Health

## 2021-01-25 DIAGNOSIS — E1159 Type 2 diabetes mellitus with other circulatory complications: Secondary | ICD-10-CM

## 2021-01-25 DIAGNOSIS — I639 Cerebral infarction, unspecified: Secondary | ICD-10-CM | POA: Diagnosis not present

## 2021-01-25 DIAGNOSIS — N390 Urinary tract infection, site not specified: Secondary | ICD-10-CM

## 2021-01-25 DIAGNOSIS — E1149 Type 2 diabetes mellitus with other diabetic neurological complication: Secondary | ICD-10-CM | POA: Diagnosis not present

## 2021-01-25 DIAGNOSIS — E1169 Type 2 diabetes mellitus with other specified complication: Secondary | ICD-10-CM

## 2021-01-25 DIAGNOSIS — E079 Disorder of thyroid, unspecified: Secondary | ICD-10-CM | POA: Diagnosis not present

## 2021-01-25 DIAGNOSIS — I152 Hypertension secondary to endocrine disorders: Secondary | ICD-10-CM | POA: Diagnosis not present

## 2021-01-25 DIAGNOSIS — K219 Gastro-esophageal reflux disease without esophagitis: Secondary | ICD-10-CM | POA: Insufficient documentation

## 2021-01-25 DIAGNOSIS — R5381 Other malaise: Secondary | ICD-10-CM | POA: Diagnosis not present

## 2021-01-25 DIAGNOSIS — E441 Mild protein-calorie malnutrition: Secondary | ICD-10-CM | POA: Diagnosis not present

## 2021-01-25 DIAGNOSIS — E785 Hyperlipidemia, unspecified: Secondary | ICD-10-CM

## 2021-01-25 NOTE — Progress Notes (Signed)
Location:  [facility] Room Number: 111-W Place of Service:  SNF (31)   CODE STATUS: DNR  No Known Allergies  Chief Complaint  Patient presents with   Hospitalization Follow-up    HPI:  He is a 75 year old man who has been hospitalized from 01-18-21 through 01-22-21. His medical history includes: diabetes; hypertension; recent CVA; hyperlipidemia. In May of this year he presented to the dizziness consistent with right PICA territory stroke. He was placed on dual antiplatelet therapy. On 01-18-21 he presented to the ED with right sided weakness in his right lower extremity. He had numbness in his right upper extremity. No change in vision; no facial droop. He had not been taking plavix or lipitor.  He was treated for an acute stroke due to ischemia. He was consulted with neurology and is to on dual antiplatelet therapy for 3-6 months. His carotid ultrasound no hemodynamically significant stenosis. He is here for short term rehab with his goal to return back home. He does complain of nausea; vomiting; constipation and dizziness. He tells me that the dizziness is related to his cva's per neurology. He will continue to be followed for his chronic illnesses including: Acute stroke due to ischemia:   Thyroid disease: i Type 2 diabetes mellitus with neurological manifestations: Marland Kitchen Mild protein calorie malnutrition    Past Medical History:  Diagnosis Date   Hypertension    Benign Essential   Hypothyroidism    Obesity    Osteoarthritis    PVD (peripheral vascular disease) ([place])    Type 2 diabetes mellitus ([place])     Past Surgical History:  Procedure Laterality Date   APPENDECTOMY     TONSILLECTOMY      Social History   Socioeconomic History   Marital status: Single    Spouse name: Not on file   Number of children: Not on file   Years of education: 12   Highest education level: Not on file  Occupational History   Occupation: RETIRED  Tobacco Use   Smoking  status: Former   Smokeless tobacco: Never  Scientific laboratory technician Use: Never used  Substance and Sexual Activity   Alcohol use: Not Currently   Drug use: Never   Sexual activity: Not Currently  Other Topics Concern   Not on file  Social History Narrative   Not on file   Social Determinants of Health   Financial Resource Strain: Not on file  Food Insecurity: Not on file  Transportation Needs: Not on file  Physical Activity: Not on file  Stress: Not on file  Social Connections: Not on file  Intimate Partner Violence: Not on file   Family History  Problem Relation Age of Onset   Osteoporosis Mother    Hypertension Mother    Cancer Mother    Heart attack Father    Heart attack Maternal Grandmother    Pneumonia Maternal Grandfather    Heart disease Paternal Grandmother    Heart disease Paternal Grandfather       VITAL SIGNS BP (!) 152/72   Pulse (!) 56   Temp (!) 97.4 F (36.3 C)   Resp 20   Ht 5' 11"  (1.803 m)   Wt 170 lb 3.2 oz (77.2 kg)   SpO2 94%   BMI 23.74 kg/m   Outpatient Encounter Medications as of 01/25/2021  Medication Sig   Ascorbic Acid (VITAMIN C) 100 MG tablet Take 100 mg by mouth daily.   aspirin EC 81 MG  tablet Take 1 tablet (81 mg total) by mouth daily. Swallow whole.   atorvastatin (LIPITOR) 80 MG tablet Take 1 tablet (80 mg total) by mouth every evening.   cephALEXin (KEFLEX) 500 MG capsule Take 1 capsule (500 mg total) by mouth every 8 (eight) hours. X 5 days   Cholecalciferol (VITAMIN D3) 10 MCG (400 UNIT) CAPS Take 1 capsule by mouth daily.   clopidogrel (PLAVIX) 75 MG tablet Take 1 tablet (75 mg total) by mouth daily.   glipiZIDE (GLUCOTROL) 5 MG tablet Take 1 tablet (5 mg total) by mouth daily before breakfast.   levothyroxine (SYNTHROID) 75 MCG tablet Take 75 mcg by mouth daily before breakfast.   metFORMIN (GLUCOPHAGE-XR) 500 MG 24 hr tablet Take 1,000 mg by mouth in the morning and at bedtime.   NON FORMULARY Diet: No Added Salt, ConCho    Omega-3 Fatty Acids (FISH OIL PO) Take 2,500 mg by mouth in the morning and at bedtime.   omeprazole (PRILOSEC OTC) 20 MG tablet Take 20 mg by mouth at bedtime.   [DISCONTINUED] pantoprazole (PROTONIX) 40 MG tablet Take 1 tablet (40 mg total) by mouth daily.   No facility-administered encounter medications on file as of 01/25/2021.     SIGNIFICANT DIAGNOSTIC EXAMS  TODAY  01-18-21: ct of head:  Brain: Mild chronic ischemic white matter disease is noted. Mild diffuse cortical atrophy is noted. No mass effect or midline shift is noted. Ventricular size is within normal limits. There is no evidence of mass lesion, hemorrhage or acute infarction. Vascular: No hyperdense vessel or unexpected calcification. Skull: Normal. Negative for fracture or focal lesion. Sinuses/Orbits: No acute finding.  01-18-21: MRI/MRA of brain MRI: 1. Acute infarct in the posterolateral right medulla, likely right PICA territory. Additional acute right PCA territory infarcts in the right occipital lobe and posterior right corpus callosum. Associated mild edema without mass effect. 2. Developing encephalomalacia in the areas of previously seen infarct in the inferior right cerebellum. 3. Remote lacunar infarcts in the left thalamus, right caudate, and bilateral corona radiata. 4. Moderate chronic microvascular disease. MRA: 1. Severe stenosis of bilateral P2 PCAs with poor flow related signal in the more distal PCAs. Also, severe stenosis of the distal left M1 MCA, bilateral M2 MCA branches and bilateral A2 ACAs. 2. Similar multifocal moderate narrowing of the distal right intradural vertebral artery. Similar flow related signal in the proximal right PICA, but not the more distal right PICA.  01-19-21: 2-d echo:  1. Left ventricular ejection fraction, by estimation, is 70 to 75%. The  left ventricle has hyperdynamic function. The left ventricle has no  regional wall motion abnormalities. There is mild left ventricular   hypertrophy. Left ventricular diastolic  parameters are indeterminate.   LABS REVIEWED:   01-18-21: wbc 13.5; hgb 13.2; hct 40.5; mcv 96.4 plt 377; glucose 260; bun 22; creat 1.19; k+ 4.9; na++ 137; ca 9.6 GFR>60; alk phos 161;  albumin 3.6; urine culture: staphylococcus aureus 01-20-21: wbc 11.6; hgb 12.8; hct 39.0; mcv 96.5 plt 333; glucose 207; bun 26; creat 1.22; k+ 5.1; na++ 139; ca 9.2 GFR>60; hgb a1c 8.3; chol 217; LDL 150; trig 151; hdl 37 01-21-21: wbc 13.5; hgb 13.6; hct 41.6; mcv 995.9 plt 390; glucose 243; bun 31;creat 1.28; k+ 5.1; na++ 137; ca 9.5 GFR 59; alk phos 134; albumin 3.2 mag 1.8     Review of Systems  Constitutional:  Negative for malaise/fatigue.  Respiratory:  Negative for cough and shortness of breath.   Cardiovascular:  Negative for chest pain, palpitations and leg swelling.  Gastrointestinal:  Positive for constipation, nausea and vomiting. Negative for abdominal pain and heartburn.  Musculoskeletal:  Negative for back pain, joint pain and myalgias.  Skin: Negative.   Neurological:  Positive for dizziness.  Psychiatric/Behavioral:  Positive for depression. The patient is not nervous/anxious.    Physical Exam Constitutional:      General: He is not in acute distress.    Appearance: He is well-developed. He is not diaphoretic.     Comments: Scruffy   Neck:     Thyroid: No thyromegaly.  Cardiovascular:     Rate and Rhythm: Normal rate and regular rhythm.     Pulses: Normal pulses.     Heart sounds: Normal heart sounds.  Pulmonary:     Effort: Pulmonary effort is normal. No respiratory distress.     Breath sounds: Normal breath sounds.  Abdominal:     General: There is no distension.     Palpations: Abdomen is soft.     Tenderness: There is abdominal tenderness.     Comments: Generalized tenderness Bowel sounds hypoactive   Musculoskeletal:        General: Normal range of motion.     Cervical back: Neck supple.     Right lower leg: No edema.     Left  lower leg: No edema.  Lymphadenopathy:     Cervical: No cervical adenopathy.  Skin:    General: Skin is warm and dry.     Coloration: Skin is pale.  Neurological:     Mental Status: He is alert. Mental status is at baseline.  Psychiatric:        Mood and Affect: Mood normal.     ASSESSMENT/ PLAN:  TODAY  Acute stroke due to ischemia: has dizziness and right side weakness present. Will continue therapy as directed will follow up with neurology; will continue asa 81 mg daily and plavix 75 mg daily   2. Thyroid disease: is stable will continue synthroid 75 mcg daily   3. Type 2 diabetes mellitus with neurological manifestations: hgb a1c 8.3 will continue metformin xr  1 gm twice daily. Will stop glipizide due to high risk of hypoglycemia and will begin januvia 25 mg daily   4. Acute lower UTI: will complete keflex 500 mg three times daily through 01-26-21  5. Mild protein calorie malnutrition: is stable albumin 3.2 will continue supplements as directed  6. Hyperlipidemia associated with type 2 diabetes mellitus: LDL 150 will continue lipitor 80 mg daily   7. Hypertension associated with type 2 diabetes mellitus: is without change: b/p 152/72: will begin cozaar 12.5 mg daily will hold for systolic b/p: <952.   8. GERD without esophagitis: is stable will continue prilosec 20 mg daily   9. Physical deconditioning: will continue therapy as directed to improve upon his level of independence with his adls.   10. Chronic constipation: is worse: will begin senna s daily; MOM and dulcolax suppository given today.    Ok Edwards NP [facility]  Contact [phone] Monday through Friday 8am- 5pm  After hours call [phone]

## 2021-01-27 ENCOUNTER — Inpatient Hospital Stay
Admission: RE | Admit: 2021-01-27 | Discharge: 2021-03-12 | Disposition: A | Discharge status: Nursing Home (Includes ICF) | Payer: Medicare Other | Source: Ambulatory Visit | Attending: Internal Medicine | Admitting: Internal Medicine

## 2021-01-27 ENCOUNTER — Other Ambulatory Visit: Payer: Self-pay

## 2021-01-27 ENCOUNTER — Non-Acute Institutional Stay (SKILLED_NURSING_FACILITY): Payer: Medicare Other | Admitting: Internal Medicine

## 2021-01-27 ENCOUNTER — Encounter: Payer: Self-pay | Admitting: Internal Medicine

## 2021-01-27 ENCOUNTER — Emergency Department (HOSPITAL_COMMUNITY)
Admission: EM | Admit: 2021-01-27 | Discharge: 2021-01-27 | Disposition: A | Discharge status: Home / Self Care | Payer: Medicare Other | Attending: Emergency Medicine | Admitting: Emergency Medicine

## 2021-01-27 DIAGNOSIS — B9689 Other specified bacterial agents as the cause of diseases classified elsewhere: Secondary | ICD-10-CM | POA: Diagnosis not present

## 2021-01-27 DIAGNOSIS — Z7984 Long term (current) use of oral hypoglycemic drugs: Secondary | ICD-10-CM | POA: Insufficient documentation

## 2021-01-27 DIAGNOSIS — I10 Essential (primary) hypertension: Secondary | ICD-10-CM | POA: Diagnosis not present

## 2021-01-27 DIAGNOSIS — E782 Mixed hyperlipidemia: Secondary | ICD-10-CM | POA: Diagnosis not present

## 2021-01-27 DIAGNOSIS — Z79899 Other long term (current) drug therapy: Secondary | ICD-10-CM | POA: Diagnosis not present

## 2021-01-27 DIAGNOSIS — E1149 Type 2 diabetes mellitus with other diabetic neurological complication: Secondary | ICD-10-CM

## 2021-01-27 DIAGNOSIS — E875 Hyperkalemia: Secondary | ICD-10-CM | POA: Insufficient documentation

## 2021-01-27 DIAGNOSIS — I69359 Hemiplegia and hemiparesis following cerebral infarction affecting unspecified side: Secondary | ICD-10-CM

## 2021-01-27 DIAGNOSIS — R531 Weakness: Secondary | ICD-10-CM

## 2021-01-27 DIAGNOSIS — N390 Urinary tract infection, site not specified: Secondary | ICD-10-CM | POA: Insufficient documentation

## 2021-01-27 DIAGNOSIS — I639 Cerebral infarction, unspecified: Secondary | ICD-10-CM | POA: Diagnosis not present

## 2021-01-27 DIAGNOSIS — E039 Hypothyroidism, unspecified: Secondary | ICD-10-CM | POA: Insufficient documentation

## 2021-01-27 DIAGNOSIS — R112 Nausea with vomiting, unspecified: Secondary | ICD-10-CM | POA: Insufficient documentation

## 2021-01-27 DIAGNOSIS — H547 Unspecified visual loss: Secondary | ICD-10-CM

## 2021-01-27 DIAGNOSIS — Z87891 Personal history of nicotine dependence: Secondary | ICD-10-CM | POA: Diagnosis not present

## 2021-01-27 DIAGNOSIS — Z7982 Long term (current) use of aspirin: Secondary | ICD-10-CM | POA: Insufficient documentation

## 2021-01-27 DIAGNOSIS — R52 Pain, unspecified: Secondary | ICD-10-CM

## 2021-01-27 LAB — URINALYSIS, ROUTINE W REFLEX MICROSCOPIC
Bacteria, UA: NONE SEEN
Bilirubin Urine: NEGATIVE
Glucose, UA: 150 mg/dL — AB
Ketones, ur: 5 mg/dL — AB
Nitrite: NEGATIVE
Protein, ur: 30 mg/dL — AB
Specific Gravity, Urine: 1.018 (ref 1.005–1.030)
WBC, UA: >50 WBC/hpf — ABNORMAL HIGH (ref 0–5)
pH: 5 (ref 5.0–8.0)

## 2021-01-27 LAB — TROPONIN I (HIGH SENSITIVITY)
Troponin I (High Sensitivity): 10 ng/L (ref <18)
Troponin I (High Sensitivity): 13 ng/L (ref <18)

## 2021-01-27 LAB — COMPREHENSIVE METABOLIC PANEL
ALT: 11 U/L (ref 0–44)
AST: 12 U/L — ABNORMAL LOW (ref 15–41)
Albumin: 3.4 g/dL — ABNORMAL LOW (ref 3.5–5.0)
Alkaline Phosphatase: 142 U/L — ABNORMAL HIGH (ref 38–126)
Anion gap: 11 (ref 5–15)
BUN: 41 mg/dL — ABNORMAL HIGH (ref 8–23)
CO2: 27 mmol/L (ref 22–32)
Calcium: 9.5 mg/dL (ref 8.9–10.3)
Chloride: 93 mmol/L — ABNORMAL LOW (ref 98–111)
Creatinine, Ser: 1.58 mg/dL — ABNORMAL HIGH (ref 0.61–1.24)
GFR, Estimated: 45 mL/min — ABNORMAL LOW (ref >60)
Glucose, Bld: 414 mg/dL — ABNORMAL HIGH (ref 70–99)
Potassium: 5.6 mmol/L — ABNORMAL HIGH (ref 3.5–5.1)
Sodium: 131 mmol/L — ABNORMAL LOW (ref 135–145)
Total Bilirubin: 0.7 mg/dL (ref 0.3–1.2)
Total Protein: 7.1 g/dL (ref 6.5–8.1)

## 2021-01-27 LAB — CBC WITH DIFFERENTIAL/PLATELET
Abs Immature Granulocytes: 0.19 10*3/uL — ABNORMAL HIGH (ref 0.00–0.07)
Basophils Absolute: 0 10*3/uL (ref 0.0–0.1)
Basophils Relative: 0 %
Eosinophils Absolute: 0 10*3/uL (ref 0.0–0.5)
Eosinophils Relative: 0 %
HCT: 43.3 % (ref 39.0–52.0)
Hemoglobin: 14.6 g/dL (ref 13.0–17.0)
Immature Granulocytes: 1 %
Lymphocytes Relative: 3 %
Lymphs Abs: 0.7 10*3/uL (ref 0.7–4.0)
MCH: 31.3 pg (ref 26.0–34.0)
MCHC: 33.7 g/dL (ref 30.0–36.0)
MCV: 92.7 fL (ref 80.0–100.0)
Monocytes Absolute: 0.8 10*3/uL (ref 0.1–1.0)
Monocytes Relative: 4 %
Neutro Abs: 19.1 10*3/uL — ABNORMAL HIGH (ref 1.7–7.7)
Neutrophils Relative %: 92 %
Platelets: 468 10*3/uL — ABNORMAL HIGH (ref 150–400)
RBC: 4.67 MIL/uL (ref 4.22–5.81)
RDW: 13.9 % (ref 11.5–15.5)
WBC: 20.8 10*3/uL — ABNORMAL HIGH (ref 4.0–10.5)
nRBC: 0 % (ref 0.0–0.2)

## 2021-01-27 MED ORDER — SODIUM ZIRCONIUM CYCLOSILICATE 5 G PO PACK
10.0000 g | PACK | Freq: Once | ORAL | Status: AC
  Administered 2021-01-27: 10 g via ORAL
  Filled 2021-01-27: qty 2

## 2021-01-27 MED ORDER — ONDANSETRON HCL 4 MG/2ML IJ SOLN
4.0000 mg | Freq: Once | INTRAMUSCULAR | Status: AC
  Administered 2021-01-27: 4 mg via INTRAVENOUS
  Filled 2021-01-27: qty 2

## 2021-01-27 MED ORDER — SULFAMETHOXAZOLE-TRIMETHOPRIM 800-160 MG PO TABS: 1.0000 | ORAL_TABLET | Freq: Two times a day (BID) | ORAL | 0 refills | Status: DC

## 2021-01-27 MED ORDER — PANTOPRAZOLE SODIUM 40 MG IV SOLR
40.0000 mg | Freq: Once | INTRAVENOUS | Status: AC
  Administered 2021-01-27: 40 mg via INTRAVENOUS
  Filled 2021-01-27: qty 40

## 2021-01-27 MED ORDER — VANCOMYCIN HCL IN DEXTROSE 1-5 GM/200ML-% IV SOLN
1000.0000 mg | Freq: Once | INTRAVENOUS | Status: AC
  Administered 2021-01-27: 1000 mg via INTRAVENOUS
  Filled 2021-01-27: qty 200

## 2021-01-27 MED ORDER — SODIUM CHLORIDE 0.9 % IV BOLUS
1000.0000 mL | Freq: Once | INTRAVENOUS | Status: AC
Start: 2021-01-27 — End: 2021-01-27
  Administered 2021-01-27: 1000 mL via INTRAVENOUS

## 2021-01-27 NOTE — Assessment & Plan Note (Signed)
DM with neurovascular complications Glucose range @ SNF: 213 -330 Current A1c: 8.6% A1c goal : Less than 8% No hypoglycemia Glipizide changed to Januvia.  If nausea and vomiting persistent issue may need to consider trial off metformin.

## 2021-01-27 NOTE — Discharge Instructions (Addendum)
Your work-up this evening shows that you have a persistent urinary tract infection.  You have been given antibiotics here and an IV and you have been prescribed a different antibiotic to take twice a day for 7 days.  Please take the medication as directed until its finished.  You will need to have your urine rechecked in 1 week.  Return to the emergency department if you develop any worsening symptoms such as abdominal pain, persistent vomiting, or fever.

## 2021-01-27 NOTE — Patient Instructions (Signed)
See assessment and plan under each diagnosis in the problem list and acutely for this visit 

## 2021-01-27 NOTE — Progress Notes (Signed)
NURSING HOME LOCATION:  [facility] ROOM NUMBER:  111 W  CODE STATUS:  DNR  PCP:  Elfredia Nevins MD  This is a comprehensive admission note to this SNFperformed on this date less than 30 days from date of admission. Included are preadmission medical/surgical history; reconciled medication list; family history; social history and comprehensive review of systems.  Corrections and additions to the records were documented. Comprehensive physical exam was also performed. Additionally a clinical summary was entered for each active diagnosis pertinent to this admission in the Problem List to enhance continuity of care.  HPI: He was hospitalized 7/25 - 01/22/2021 presenting to the ED with complaints of right-sided weakness involving right lower extremity.  He also reported numbness in the right upper extremity.  The acute physical changes were associated with falls x2 due to the weakness in the RLE. Apparently he had been compliant with his aspirin daily but was unfamiliar with Plavix or clopidogrel medication and did not think that he had been taking this.  This is despite having been hospitalized 5/24-5/25 with acute infarct in the right inferior cerebellum consistent with right PICA territory stroke.  Neurology had recommended dual antiplatelet therapy x1 month and then aspirin alone subsequently. At admission 7/25  MRI revealed multiple acute infarcts involving the posterior right lower medulla; acute right PCA infarct in the right occipital lobe and posterior right corpus callosum.  Encephalomalacia was present in the area of the prior inferior right cerebellar stroke.  Additionally remote lacunar infarcts of the left thalamus, right caudate, and bilateral corona radiata were present.  Moderate chronic microvascular disease was noted. MRA revealed no LVO but severe stenosis bilaterally of the PCAs.  Neurology reconsulted and recommended DAPT x3-6 months. EKG did reveal first-degree  AV block, RBBB for which potassium and magnesium were optimized. Diabetes was uncontrolled; sliding scale insulin was prescribed. A1c was 8.6%. He had n & v X 2 on 7/28 ; IVF & PPI initiated with improvement.  He was tolerating diet at the time of discharge.  At discharge metformin and glipizide were restarted. Hospital course was complicated by UTI positive for staph aureus for which he received Rocephin with transition to Keflex at discharge. He also stated he did not believe he was on cholesterol medication.LDL was 150. Lipitor was increased to 80 mg qd.  Past medical and surgical history: Includes diagnoses of essential hypertension, hypothyroidism, degenerative joint disease,GERD w/o esophagitis, and diabetes with PVD.  Surgeries include appendectomy and tonsillectomy.  Social history: He is a former drinker and former smoker.  Family history: Reviewed; significant history of heart disease.   Review of systems: Initially he gave the date as " August 23, 22".  Later he did clearly state the correct date.  He states he has had the nausea and vomiting since he had the strokes.  He validates frequent dysphagia.  He describes "little heartburn and little belly pain".   Staff reports he has not been eating today and vomited material which was guaiac positive.He has no past GI history of esophagitis, stricture, or ulcer. Here at the SNF Accu-Cheks have ranged from 213 up to 330.  Glipizide was changed to Januvia.  He also has experienced nausea and vomiting for which clear liquids were prescribed from 8/1-8/2.  Physical exam:  Pertinent or positive findings: He sits in the wheelchair with his eyes closed, clinically in obvious discomfort.  Hair is long, white, and disheveled.  Bilateral ptosis is present even when eyes are open. He  is constantly hiccuping.  Breath sounds are decreased.  Heart sounds are slightly distant and rhythm irregular.  Abdomen is distended and quiet but nontender to palpation.   Pedal pulses are decreased.  He has classic rheumatoid arthritic changes of the hands.  He validates he does have a diagnosis of rheumatoid arthritis.  Skin is cool and frankly diaphoretic.  He has several small slightly erythematous superficial lesions over the right shin area. Lower extremities are stronger to opposition than upper extremities. Because of his clinical state;I requested repeat vital signs which revealed heart rate of 49, blood pressure 70/50, and respiratory rate of 18.  O2 sats were 95%.  Glucose was 438.  Lymphatic: No lymphadenopathy about the head, neck, axilla. Eyes: No conjunctival inflammation or lid edema is present. There is no scleral icterus. Ears:  External ear exam shows no significant lesions or deformities.   Nose:  External nasal examination shows no deformity or inflammation. Nasal mucosa are pink and moist without lesions, exudates Oral exam: Lips and gums are healthy appearing.There is no oropharyngeal erythema or exudate. Neck:  No thyromegaly, masses, tenderness noted.    Heart:  No gallop, murmur, click, rub.  Lungs:  without wheezes, rhonchi, rales, rubs. Abdomen: no organomegaly, hernias, masses. GU: Deferred  Extremities:  No cyanosis, clubbing, edema. Neurologic exam:  Balance, Rhomberg, finger to nose testing could not be completed due to clinical state  See clinical summary under each active problem in the Problem List with associated updated therapeutic plan

## 2021-01-27 NOTE — ED Triage Notes (Signed)
Pt was sent here from the [facility] for nausea and the pt vomited once and they stated that it looked like it had blood in it.

## 2021-01-27 NOTE — ED Provider Notes (Signed)
[facility] EMERGENCY DEPARTMENT Provider Note   CSN: [id number] Arrival date & time: 01/27/21  1316     History Chief Complaint  Patient presents with   Nausea    Jason Cox is a 75 y.o. male.  HPI      Jason Cox is a 75 y.o. male who currently resides at the [facility].  He has a past medical history including hypertension, ischemic stroke, type 2 diabetes, UTI, and peripheral vascular disease.  Was admitted to the hospital on 01/18/2021 with right-sided weakness and found to have multiple acute infarcts of the posterior right lower medulla right PCA and right occipital lobe and posterior right corpus callosum.  He was also found to have a UTI during his hospital course and received Rocephin and discharged out on Keflex.  Today, he presents to the Emergency Department with complaints of nausea and vomiting.  SNF staff concerned about possible blood in his vomitus and he was sent here for further evaluation.  Patient complains of persistent nausea with burning pain to his upper abdomen and mid chest, decreased appetite and generalized weakness.  He denies abdominal pain, dysuria, flank or back pain, shortness of breath fever and chills.   Past Medical History:  Diagnosis Date   Hypertension    Benign Essential   Hypothyroidism    Obesity    Osteoarthritis    PVD (peripheral vascular disease) (HCC)    Type 2 diabetes mellitus (HCC)     Patient Active Problem List   Diagnosis Date Noted   Nausea and vomiting 01/27/2021   Hyperlipidemia associated with type 2 diabetes mellitus (HCC) 01/25/2021   Hypertension associated with type 2 diabetes mellitus (HCC) 01/25/2021   GERD without esophagitis 01/25/2021   Physical deconditioning 01/25/2021   Hyperlipidemia    Acute stroke due to ischemia (HCC) 11/17/2020   Essential hypertension 11/17/2020   Type 2 diabetes mellitus with neurological complications (HCC) 11/17/2020   Acute lower UTI 11/17/2020   Mild  protein-calorie malnutrition (HCC) 11/17/2020   Thyroid disease 11/17/2020    Past Surgical History:  Procedure Laterality Date   APPENDECTOMY     TONSILLECTOMY         Family History  Problem Relation Age of Onset   Osteoporosis Mother    Hypertension Mother    Cancer Mother    Heart attack Father    Heart attack Maternal Grandmother    Pneumonia Maternal Grandfather    Heart disease Paternal Grandmother    Heart disease Paternal Grandfather     Social History   Tobacco Use   Smoking status: Former   Smokeless tobacco: Never  Building services engineer Use: Never used  Substance Use Topics   Alcohol use: Not Currently   Drug use: Never    Home Medications Prior to Admission medications   Medication Sig Start Date End Date Taking? Authorizing Provider  Ascorbic Acid (VITAMIN C) 100 MG tablet Take 100 mg by mouth daily.    [provider]  aspirin EC 81 MG tablet Take 1 tablet (81 mg total) by mouth daily. Swallow whole. 11/18/20 11/18/21  Vassie Loll, MD  atorvastatin (LIPITOR) 80 MG tablet Take 1 tablet (80 mg total) by mouth every evening. 01/22/21   Tat, Onalee Hua, MD  cephALEXin (KEFLEX) 500 MG capsule Take 1 capsule (500 mg total) by mouth every 8 (eight) hours. X 5 days 01/22/21   Catarina Hartshorn, MD  Cholecalciferol (VITAMIN D3) 10 MCG (400 UNIT) CAPS Take 1 capsule by mouth  daily.    [provider]  clopidogrel (PLAVIX) 75 MG tablet Take 1 tablet (75 mg total) by mouth daily. 11/18/20 11/18/21  Vassie Loll, MD  glipiZIDE (GLUCOTROL) 5 MG tablet Take 1 tablet (5 mg total) by mouth daily before breakfast. 01/22/21   Tat, Onalee Hua, MD  levothyroxine (SYNTHROID) 75 MCG tablet Take 75 mcg by mouth daily before breakfast. 11/22/19   [provider]  metFORMIN (GLUCOPHAGE-XR) 500 MG 24 hr tablet Take 1,000 mg by mouth in the morning and at bedtime. 11/01/19   [provider]  NON FORMULARY Diet: No Added Salt, ConCho    [provider]  Omega-3  Fatty Acids (FISH OIL PO) Take 2,500 mg by mouth in the morning and at bedtime.    [provider]  omeprazole (PRILOSEC OTC) 20 MG tablet Take 20 mg by mouth at bedtime.    [provider]    Allergies    Patient has no known allergies.  Review of Systems   Review of Systems  Constitutional:  Negative for chills, fatigue and fever.  HENT:  Negative for sore throat and trouble swallowing.   Respiratory:  Negative for cough, shortness of breath and wheezing.   Cardiovascular:  Positive for chest pain. Negative for palpitations.  Gastrointestinal:  Positive for abdominal pain, nausea and vomiting. Negative for blood in stool.  Genitourinary:  Negative for dysuria, flank pain, hematuria, penile swelling and scrotal swelling.  Musculoskeletal:  Negative for arthralgias, back pain, myalgias, neck pain and neck stiffness.  Skin:  Negative for rash.  Neurological:  Positive for weakness (generalized weakness). Negative for dizziness and numbness.  Hematological:  Does not bruise/bleed easily.   Physical Exam Updated Vital Signs BP (!) 117/59 (BP Location: Left Arm)   Pulse 86   Temp 97.7 F (36.5 C) (Oral)   Resp 18   SpO2 99%   Physical Exam Vitals and nursing note reviewed.  Constitutional:      Appearance: Normal appearance. He is not ill-appearing or toxic-appearing.  HENT:     Head: Normocephalic.     Mouth/Throat:     Mouth: Mucous membranes are dry.  Neck:     Thyroid: No thyromegaly.     Meningeal: Kernig's sign absent.  Cardiovascular:     Rate and Rhythm: Normal rate and regular rhythm.     Pulses: Normal pulses.  Pulmonary:     Effort: Pulmonary effort is normal.     Breath sounds: Normal breath sounds. No wheezing.  Abdominal:     Palpations: Abdomen is soft.     Tenderness: There is no abdominal tenderness. There is no right CVA tenderness, left CVA tenderness, guarding or rebound.  Musculoskeletal:        General: Normal range of motion.      Cervical back: Normal range of motion and neck supple.     Right lower leg: No edema.     Left lower leg: No edema.  Skin:    General: Skin is warm.     Capillary Refill: Capillary refill takes less than 2 seconds.     Findings: No rash.  Neurological:     General: No focal deficit present.     Mental Status: He is alert.     Sensory: No sensory deficit.     Motor: No weakness.    ED Results / Procedures / Treatments   Labs (all labs ordered are listed, but only abnormal results are displayed) Labs Reviewed  COMPREHENSIVE METABOLIC PANEL -  Abnormal; Notable for the following components:      Result Value   Sodium 131 (*)    Potassium 5.6 (*)    Chloride 93 (*)    Glucose, Bld 414 (*)    BUN 41 (*)    Creatinine, Ser 1.58 (*)    Albumin 3.4 (*)    AST 12 (*)    Alkaline Phosphatase 142 (*)    GFR, Estimated 45 (*)    All other components within normal limits  CBC WITH DIFFERENTIAL/PLATELET - Abnormal; Notable for the following components:   WBC 20.8 (*)    Platelets 468 (*)    Neutro Abs 19.1 (*)    Abs Immature Granulocytes 0.19 (*)    All other components within normal limits  URINALYSIS, ROUTINE W REFLEX MICROSCOPIC - Abnormal; Notable for the following components:   APPearance HAZY (*)    Glucose, UA 150 (*)    Hgb urine dipstick MODERATE (*)    Ketones, ur 5 (*)    Protein, ur 30 (*)    Leukocytes,Ua LARGE (*)    WBC, UA >50 (*)    All other components within normal limits  URINE CULTURE  TROPONIN I (HIGH SENSITIVITY)  TROPONIN I (HIGH SENSITIVITY)    EKG None EKG Interpretation  Date/Time: 01/27/21  Ventricular Rate: 102 PR Interval:  QRS Duration: 120 QT Interval: 372  QTC Calculation:4 84 R Axis:    Text Interpretation: Undetermined rhythm.  Right bundle branch block.  Left anterior fascicular block.  Right bundle branch block unchanged from previous EKG reviewed by Dr. Estell Harpin.  Radiology No results found.  Procedures Procedures    Medications Ordered in ED Medications  sodium chloride 0.9 % bolus 1,000 mL (0 mLs Intravenous Stopped 01/27/21 2019)  ondansetron (ZOFRAN) injection 4 mg (4 mg Intravenous Given 01/27/21 1502)  pantoprazole (PROTONIX) injection 40 mg (40 mg Intravenous Given 01/27/21 1503)  sodium zirconium cyclosilicate (LOKELMA) packet 10 g (10 g Oral Given 01/27/21 2041)  vancomycin (VANCOCIN) IVPB 1000 mg/200 mL premix (0 mg Intravenous Stopped 01/27/21 2302)    ED Course  I have reviewed the triage vital signs and the nursing notes.  Pertinent labs & imaging results that were available during my care of the patient were reviewed by me and considered in my medical decision making (see chart for details).    MDM Rules/Calculators/A&P                           Patient here from [facility] for evaluation of nausea and vomiting.  SNF staff concerned that patient vomited once earlier today and may have contained blood.  Sent here for further evaluation. Pt has reassuring vitals and does not appear septic.   On exam, he has a benign abdominal exam.  He does report recent hospitalization for stroke and hospital course was complicated by UTI he was given Rocephin during his hospital stay and sent out on Keflex.  On review of medical records, patient had urine culture that grew staph aureus, pan sensitive without reported sensitivities to penicillins.  Labs here show leukocytosis of 20,000, chemistry show potassium of 5.6,.  Patient likely dehydrated as BUN and creatinine are elevated above baseline.  Troponin unremarkable.  Urinalysis does show moderate blood with large leukocytes and greater than 50 white cells.  Urine culture repeated.  Patient given Thompson Caul here.  No acute ischemic EKG changes. His leukocytosis likely secondary to his continued UTI.   Patient  given IV fluids and antiemetic here.  He was also given IV vancomycin.  On recheck, he reports feeling better.  I feel that he is appropriate for discharge  back to the SNF facility with close follow-up and will change antibiotic from Keflex to Bactrim.  Patient seen by Dr. Estell Harpin and care plan discussed.  Final Clinical Impression(s) / ED Diagnoses Final diagnoses:  Lower urinary tract infectious disease  Hyperkalemia    Rx / DC Orders ED Discharge Orders     None        Rosey Bath 01/31/21 2307    Bethann Berkshire, MD 02/07/21 [phone]

## 2021-01-27 NOTE — ED Notes (Signed)
Report given to [facility]. 

## 2021-01-27 NOTE — Assessment & Plan Note (Signed)
To ED for evaluation . Anticipate need for GI consult & EGD.

## 2021-01-27 NOTE — Assessment & Plan Note (Signed)
LDL goal < 70, ideally < 55. Lipitor increased to 80 mg qd

## 2021-01-28 ENCOUNTER — Other Ambulatory Visit (HOSPITAL_COMMUNITY): Payer: Self-pay | Admitting: Adult Health

## 2021-01-28 ENCOUNTER — Other Ambulatory Visit: Payer: Self-pay | Admitting: *Deleted

## 2021-01-28 ENCOUNTER — Inpatient Hospital Stay (HOSPITAL_COMMUNITY): Payer: Medicare Other | Attending: Adult Health

## 2021-01-28 ENCOUNTER — Ambulatory Visit (HOSPITAL_COMMUNITY)
Admission: RE | Admit: 2021-01-28 | Discharge: 2021-01-28 | Disposition: A | Discharge status: Home / Self Care | Payer: Medicare Other | Source: Ambulatory Visit | Attending: Adult Health | Admitting: Adult Health

## 2021-01-28 ENCOUNTER — Non-Acute Institutional Stay (SKILLED_NURSING_FACILITY): Payer: Medicare Other | Admitting: Adult Health

## 2021-01-28 ENCOUNTER — Encounter: Payer: Self-pay | Admitting: Adult Health

## 2021-01-28 ENCOUNTER — Other Ambulatory Visit (HOSPITAL_COMMUNITY)
Admission: RE | Admit: 2021-01-28 | Discharge: 2021-01-28 | Disposition: A | Discharge status: Home / Self Care | Payer: Medicare Other | Source: Ambulatory Visit | Attending: Adult Health | Admitting: Adult Health

## 2021-01-28 DIAGNOSIS — I499 Cardiac arrhythmia, unspecified: Secondary | ICD-10-CM | POA: Insufficient documentation

## 2021-01-28 DIAGNOSIS — R110 Nausea: Secondary | ICD-10-CM

## 2021-01-28 DIAGNOSIS — N179 Acute kidney failure, unspecified: Secondary | ICD-10-CM | POA: Diagnosis not present

## 2021-01-28 DIAGNOSIS — I700 Atherosclerosis of aorta: Secondary | ICD-10-CM | POA: Insufficient documentation

## 2021-01-28 DIAGNOSIS — R1110 Vomiting, unspecified: Secondary | ICD-10-CM

## 2021-01-28 DIAGNOSIS — N390 Urinary tract infection, site not specified: Secondary | ICD-10-CM | POA: Insufficient documentation

## 2021-01-28 DIAGNOSIS — R112 Nausea with vomiting, unspecified: Secondary | ICD-10-CM

## 2021-01-28 DIAGNOSIS — S2231XA Fracture of one rib, right side, initial encounter for closed fracture: Secondary | ICD-10-CM | POA: Diagnosis not present

## 2021-01-28 DIAGNOSIS — K5909 Other constipation: Secondary | ICD-10-CM | POA: Diagnosis not present

## 2021-01-28 DIAGNOSIS — I440 Atrioventricular block, first degree: Secondary | ICD-10-CM | POA: Insufficient documentation

## 2021-01-28 DIAGNOSIS — I2510 Atherosclerotic heart disease of native coronary artery without angina pectoris: Secondary | ICD-10-CM | POA: Insufficient documentation

## 2021-01-28 DIAGNOSIS — J9811 Atelectasis: Secondary | ICD-10-CM | POA: Diagnosis not present

## 2021-01-28 LAB — CBC
HCT: 40.4 % (ref 39.0–52.0)
Hemoglobin: 13.5 g/dL (ref 13.0–17.0)
MCH: 31.4 pg (ref 26.0–34.0)
MCHC: 33.4 g/dL (ref 30.0–36.0)
MCV: 94 fL (ref 80.0–100.0)
Platelets: 437 10*3/uL — ABNORMAL HIGH (ref 150–400)
RBC: 4.3 MIL/uL (ref 4.22–5.81)
RDW: 13.9 % (ref 11.5–15.5)
WBC: 17.3 10*3/uL — ABNORMAL HIGH (ref 4.0–10.5)
nRBC: 0 % (ref 0.0–0.2)

## 2021-01-28 LAB — BASIC METABOLIC PANEL
Anion gap: 9 (ref 5–15)
BUN: 42 mg/dL — ABNORMAL HIGH (ref 8–23)
CO2: 25 mmol/L (ref 22–32)
Calcium: 9.2 mg/dL (ref 8.9–10.3)
Chloride: 96 mmol/L — ABNORMAL LOW (ref 98–111)
Creatinine, Ser: 1.39 mg/dL — ABNORMAL HIGH (ref 0.61–1.24)
GFR, Estimated: 52 mL/min — ABNORMAL LOW (ref >60)
Glucose, Bld: 292 mg/dL — ABNORMAL HIGH (ref 70–99)
Potassium: 4.9 mmol/L (ref 3.5–5.1)
Sodium: 130 mmol/L — ABNORMAL LOW (ref 135–145)

## 2021-01-28 LAB — TROPONIN I (HIGH SENSITIVITY): Troponin I (High Sensitivity): 12 ng/L (ref <18)

## 2021-01-28 LAB — MAGNESIUM: Magnesium: 1.8 mg/dL (ref 1.7–2.4)

## 2021-01-28 IMAGING — DX DG CHEST 2V
2 series · 2 of 2 positions shown · non-contrast
Comparison: Chest x-ray [DATE].

CLINICAL DATA: Nausea.

EXAM:
CHEST - 2 VIEW

[chest pa]
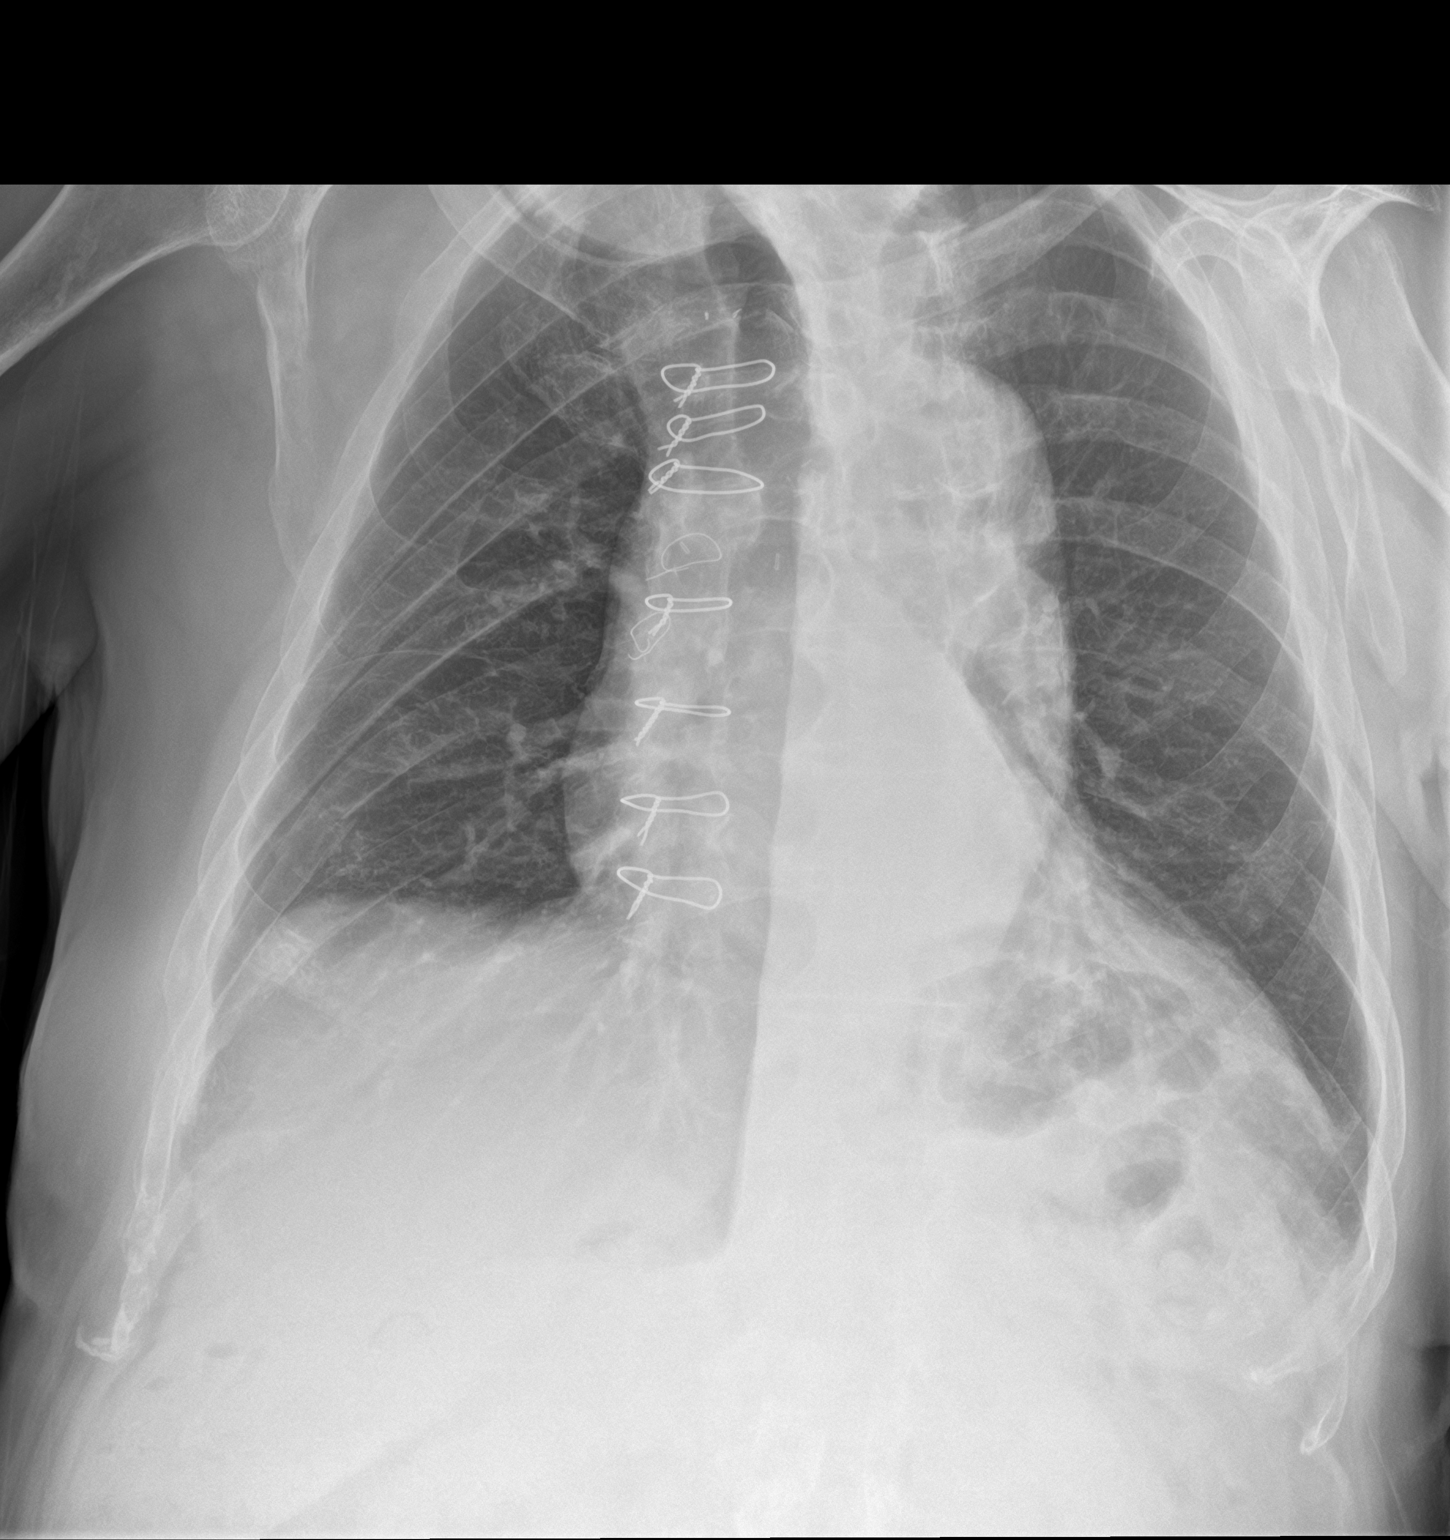

[chest lat]
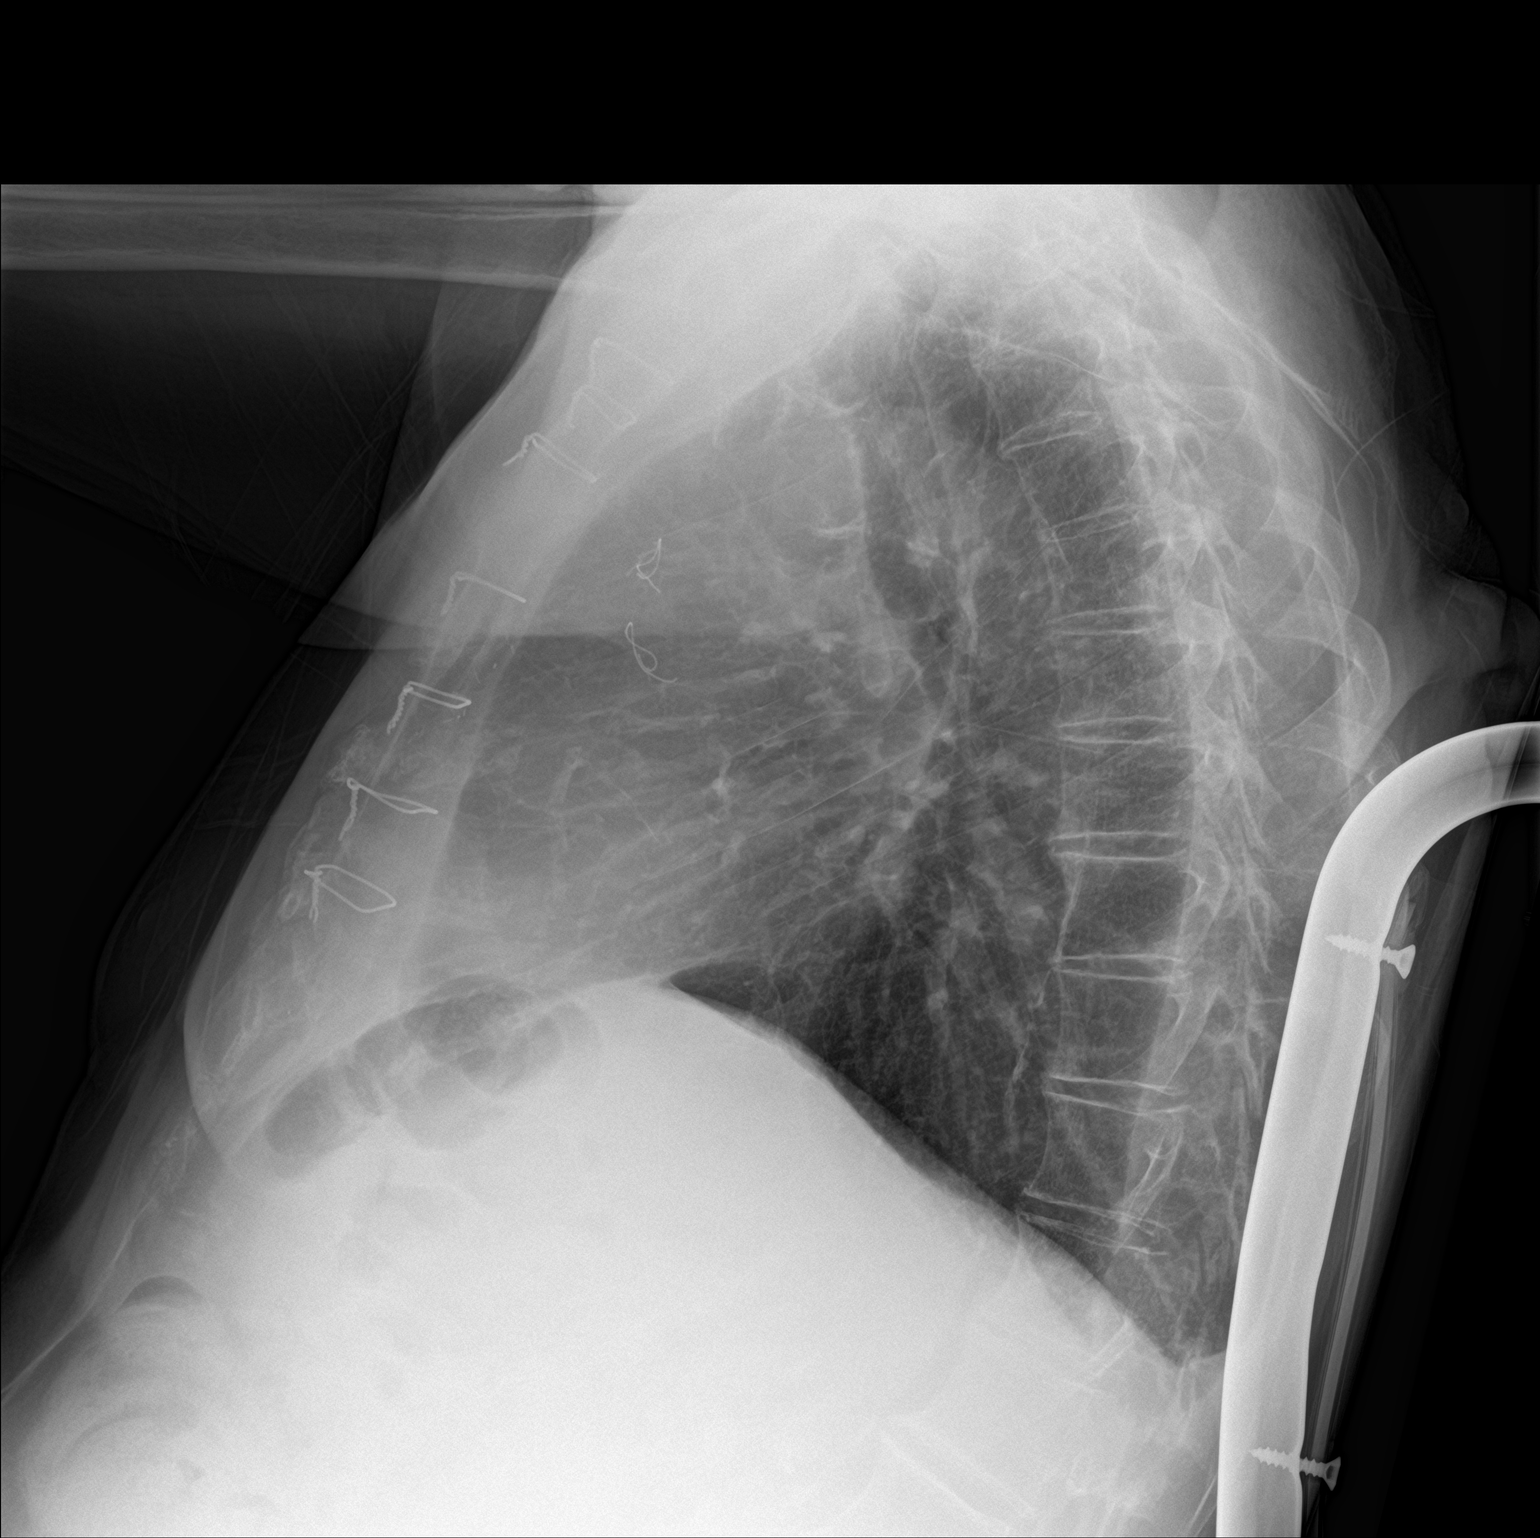

[2 of 2 positions shown; findings below may reference images not displayed]

FINDINGS: Prior CABG. Heart size normal. Low lung volumes. Mild left base
subsegmental atelectasis. Tiny left pleural effusion cannot be
excluded. No pneumothorax. Left apical pleural thickening consistent
scarring. Degenerative change thoracic spine. Right posterior eighth
rib fracture, this may be acute.
IMPRESSION: 1.  Prior CABG.  Heart size normal.

2. Mild left base subsegmental atelectasis. Tiny left pleural
effusion cannot be excluded. 3. Right posterior eighth rib fracture.
This may be acute. No pneumothorax.

## 2021-01-28 NOTE — Patient Outreach (Signed)
Member screened for potential [facility] Care Management needs. [name] resides in [facility].  Update received from [facility] SN[facility] SW reporting member's transition plan is to return home. He was living at home independently with his brother prior to admission.  Will continue to follow while in SNF. Will plan outreach as appropriate to discuss [facility] Care Management services.   Jason Noble, MSN, RN,BSN [facility] Acute Care Coordinator [phone] [facility]) [phone]  (Toll free office)

## 2021-01-28 NOTE — Progress Notes (Signed)
Location:  [facility] Room Number: 111-W Place of Service:  SNF (31)   CODE STATUS: DNR  No Known Allergies  Chief Complaint  Patient presents with   Follow-up    Emergency room follow-up     HPI:  He had had recent hospitalizations for multiple cva's. He has been nausea and vomiting which has been positive. He was taken to the ED his wbc had almost doubled; he was hyperkalemic; with slightly worsening renal function. His UA was slightly positive. He was given a dose of vancomycin in the ED and sent back with a prescription for septra. He had been unable to tolerate po last pm. He had today a KUB done which demonstrates constipation. He did eat lunch; was fed by staff. His chest was negative for acute processes. He has had blood cultures done; and will need IVF for his renal function and will need to have his constipation medications adjusted.   Past Medical History:  Diagnosis Date   Hypertension    Benign Essential   Hypothyroidism    Obesity    Osteoarthritis    PVD (peripheral vascular disease) (HCC)    Type 2 diabetes mellitus ([place])     Past Surgical History:  Procedure Laterality Date   APPENDECTOMY     TONSILLECTOMY      Social History   Socioeconomic History   Marital status: Single    Spouse name: Not on file   Number of children: Not on file   Years of education: 12   Highest education level: Not on file  Occupational History   Occupation: RETIRED  Tobacco Use   Smoking status: Former   Smokeless tobacco: Never  Scientific laboratory technician Use: Never used  Substance and Sexual Activity   Alcohol use: Not Currently   Drug use: Never   Sexual activity: Not Currently  Other Topics Concern   Not on file  Social History Narrative   Not on file   Social Determinants of Health   Financial Resource Strain: Not on file  Food Insecurity: Not on file  Transportation Needs: Not on file  Physical Activity: Not on file  Stress: Not on file   Social Connections: Not on file  Intimate Partner Violence: Not on file   Family History  Problem Relation Age of Onset   Osteoporosis Mother    Hypertension Mother    Cancer Mother    Heart attack Father    Heart attack Maternal Grandmother    Pneumonia Maternal Grandfather    Heart disease Paternal Grandmother    Heart disease Paternal Grandfather       VITAL SIGNS BP (!) 152/72   Pulse (!) 56   Temp (!) 97.4 F (36.3 C)   Resp 20   Ht 5' 11"  (1.803 m)   Wt 166 lb 6.4 oz (75.5 kg)   SpO2 94%   BMI 23.21 kg/m   Outpatient Encounter Medications as of 01/28/2021  Medication Sig Note   aspirin EC 81 MG tablet Take 1 tablet (81 mg total) by mouth daily. Swallow whole.    atorvastatin (LIPITOR) 80 MG tablet Take 1 tablet (80 mg total) by mouth every evening.    cefTRIAXone 1 g in dextrose 5 % 50 mL Inject 1 g into the vein daily at 12 noon. X 7 days for infection    Cholecalciferol (VITAMIN D3) 10 MCG (400 UNIT) CAPS Take 1 capsule by mouth daily.    clopidogrel (PLAVIX) 75 MG  tablet Take 1 tablet (75 mg total) by mouth daily.    levothyroxine (SYNTHROID) 75 MCG tablet Take 75 mcg by mouth daily before breakfast.    metFORMIN (GLUCOPHAGE-XR) 500 MG 24 hr tablet Take 1,000 mg by mouth in the morning and at bedtime.    NON FORMULARY Diet: No Added Salt, ConCho    Omega-3 Fatty Acids (FISH OIL PO) Take 2,500 mg by mouth in the morning and at bedtime.    omeprazole (PRILOSEC OTC) 20 MG tablet Take 20 mg by mouth at bedtime.    ondansetron (ZOFRAN-ODT) 8 MG disintegrating tablet Take 8 mg by mouth every 6 (six) hours as needed for nausea.    senna-docusate (SENNA-PLUS) 8.6-50 MG tablet Take 1 tablet by mouth daily.    sitaGLIPtin (JANUVIA) 25 MG tablet Take 25 mg by mouth daily.    triamcinolone cream (KENALOG) 0.1 % Apply 1 application topically 2 (two) times daily. Apply to all areas of psoriasis    vitamin C (ASCORBIC ACID) 500 MG tablet Take 500 mg by mouth daily.     [DISCONTINUED] Ascorbic Acid (VITAMIN C) 100 MG tablet Take 100 mg by mouth daily.    [DISCONTINUED] glipiZIDE (GLUCOTROL) 5 MG tablet Take 1 tablet (5 mg total) by mouth daily before breakfast. 01/27/2021: Glipizide changed to Januvia @ SNF 01/25/2021   [DISCONTINUED] sulfamethoxazole-trimethoprim (BACTRIM DS) 800-160 MG tablet Take 1 tablet by mouth 2 (two) times daily for 7 days.    No facility-administered encounter medications on file as of 01/28/2021.     SIGNIFICANT DIAGNOSTIC EXAMS  PREVIOUS  01-18-21: ct of head:  Brain: Mild chronic ischemic white matter disease is noted. Mild diffuse cortical atrophy is noted. No mass effect or midline shift is noted. Ventricular size is within normal limits. There is no evidence of mass lesion, hemorrhage or acute infarction. Vascular: No hyperdense vessel or unexpected calcification. Skull: Normal. Negative for fracture or focal lesion. Sinuses/Orbits: No acute finding.  01-18-21: MRI/MRA of brain MRI: 1. Acute infarct in the posterolateral right medulla, likely right PICA territory. Additional acute right PCA territory infarcts in the right occipital lobe and posterior right corpus callosum. Associated mild edema without mass effect. 2. Developing encephalomalacia in the areas of previously seen infarct in the inferior right cerebellum. 3. Remote lacunar infarcts in the left thalamus, right caudate, and bilateral corona radiata. 4. Moderate chronic microvascular disease. MRA: 1. Severe stenosis of bilateral P2 PCAs with poor flow related signal in the more distal PCAs. Also, severe stenosis of the distal left M1 MCA, bilateral M2 MCA branches and bilateral A2 ACAs. 2. Similar multifocal moderate narrowing of the distal right intradural vertebral artery. Similar flow related signal in the proximal right PICA, but not the more distal right PICA.  01-19-21: 2-d echo:  1. Left ventricular ejection fraction, by estimation, is 70 to 75%. The  left  ventricle has hyperdynamic function. The left ventricle has no  regional wall motion abnormalities. There is mild left ventricular  hypertrophy. Left ventricular diastolic  parameters are indeterminate.   TODAY  01-28-21: chest x-ray:  1.  Prior CABG.  Heart size normal. 2. Mild left base subsegmental atelectasis. Tiny left pleural effusion cannot be excluded.  3. Right posterior eighth rib fracture. This may be acute. No pneumothorax.  01-28-21: KUB 1. Nonobstructive bowel gas pattern. 2. Prominent colonic stool volume. Imaging appearance raises the question of chronic constipation.  01-28-21: EKG Sinus rhythm with first degree AV block; PAC: R BBB  LABS REVIEWED: PREVIOUS  01-18-21: wbc 13.5; hgb 13.2; hct 40.5; mcv 96.4 plt 377; glucose 260; bun 22; creat 1.19; k+ 4.9; na++ 137; ca 9.6 GFR>60; alk phos 161;  albumin 3.6; urine culture: staphylococcus aureus 01-20-21: wbc 11.6; hgb 12.8; hct 39.0; mcv 96.5 plt 333; glucose 207; bun 26; creat 1.22; k+ 5.1; na++ 139; ca 9.2 GFR>60; hgb a1c 8.3; chol 217; LDL 150; trig 151; hdl 37 01-21-21: wbc 13.5; hgb 13.6; hct 41.6; mcv 995.9 plt 390; glucose 243; bun 31;creat 1.28; k+ 5.1; na++ 137; ca 9.5 GFR 59; alk phos 134; albumin 3.2 mag 1.8    TODAY  01-27-21: wbc 20.8; hgb 14.6; hct 43.3; mcv 92.7 pt 468; glucose 414; bun 41; creat 1.58; k+ 5.6; na++ 131; ca 9.5; GFR 45; alk phos 142; albumin 3.4 troponin #1: 10; #2: 13 01-28-21: wbc 17.3; hgb 13.5; hct 40.4; mcv 94.0 plt 437; glucose 292; bun 42; creat 1.39; k+ 4.9; na++ 130; ca 9.2 GFR 52; troponin #3 12. Mag 1.8   Review of Systems  Constitutional:  Positive for malaise/fatigue.  Respiratory:  Negative for cough and shortness of breath.   Cardiovascular:  Negative for chest pain, palpitations and leg swelling.  Gastrointestinal:  Positive for abdominal pain, constipation and nausea. Negative for heartburn.  Musculoskeletal:  Negative for back pain, joint pain and myalgias.  Skin: Negative.    Neurological:  Negative for dizziness.  Psychiatric/Behavioral:  The patient is not nervous/anxious.    Physical Exam Constitutional:      General: He is not in acute distress.    Appearance: He is well-developed. He is not diaphoretic.  Neck:     Thyroid: No thyromegaly.  Cardiovascular:     Rate and Rhythm: Normal rate and regular rhythm.     Heart sounds: Normal heart sounds.  Pulmonary:     Effort: Pulmonary effort is normal. No respiratory distress.     Breath sounds: Normal breath sounds.  Abdominal:     General: There is no distension.     Palpations: Abdomen is soft.     Tenderness: There is abdominal tenderness.     Comments: Bowel sounds hypoactive Has generalized tenderness worse right upper quad.   Musculoskeletal:        General: Normal range of motion.     Cervical back: Neck supple.     Right lower leg: No edema.     Left lower leg: No edema.  Lymphadenopathy:     Cervical: No cervical adenopathy.  Skin:    General: Skin is warm and dry.     Comments: Right posterior rib cage bruise   Neurological:     Mental Status: He is alert. Mental status is at baseline.  Psychiatric:        Mood and Affect: Mood normal.     ASSESSMENT/ PLAN:  TODAY  Closed fracture of one rib right side initial encounter Acute renal failure unspecified renal failure type Non-intractable vomiting with nausea unspecified vomiting type  Will begin IVF NS 75 cc per hour  Await blood and urine culture results Will begin rocephin 1 mg IV daily for one week; he did respond to the dose of vancomycin Will repeat BMP in the AM  Will give him fleet enema Will monitor his status.    Ok Edwards NP [facility]  Contact [phone] Monday through Friday 8am- 5pm  After hours call [phone]

## 2021-01-29 ENCOUNTER — Other Ambulatory Visit (HOSPITAL_COMMUNITY)
Admission: RE | Admit: 2021-01-29 | Discharge: 2021-01-29 | Disposition: A | Discharge status: Home / Self Care | Payer: Medicare Other | Source: Skilled Nursing Facility | Attending: Internal Medicine | Admitting: Internal Medicine

## 2021-01-29 DIAGNOSIS — K219 Gastro-esophageal reflux disease without esophagitis: Secondary | ICD-10-CM | POA: Insufficient documentation

## 2021-01-29 LAB — OCCULT BLOOD X 1 CARD TO LAB, STOOL: Fecal Occult Bld: POSITIVE — AB

## 2021-01-29 LAB — URINE CULTURE: Culture: NO GROWTH

## 2021-02-02 ENCOUNTER — Encounter (HOSPITAL_COMMUNITY)
Admission: RE | Admit: 2021-02-02 | Discharge: 2021-02-02 | Disposition: A | Discharge status: Home / Self Care | Payer: Medicare Other | Source: Skilled Nursing Facility | Attending: Internal Medicine | Admitting: Internal Medicine

## 2021-02-02 DIAGNOSIS — K219 Gastro-esophageal reflux disease without esophagitis: Secondary | ICD-10-CM | POA: Insufficient documentation

## 2021-02-02 LAB — CULTURE, BLOOD (ROUTINE X 2)
Culture: NO GROWTH
Culture: NO GROWTH
Special Requests: ADEQUATE

## 2021-02-02 LAB — OCCULT BLOOD X 1 CARD TO LAB, STOOL: Fecal Occult Bld: NEGATIVE

## 2021-02-05 ENCOUNTER — Encounter: Payer: Self-pay | Admitting: Adult Health

## 2021-02-05 ENCOUNTER — Encounter (HOSPITAL_COMMUNITY)
Admission: RE | Admit: 2021-02-05 | Discharge: 2021-02-05 | Disposition: A | Discharge status: Home / Self Care | Payer: Medicare Other | Source: Skilled Nursing Facility | Attending: Adult Health | Admitting: Adult Health

## 2021-02-05 ENCOUNTER — Non-Acute Institutional Stay (SKILLED_NURSING_FACILITY): Payer: Medicare Other | Admitting: Adult Health

## 2021-02-05 DIAGNOSIS — I63541 Cerebral infarction due to unspecified occlusion or stenosis of right cerebellar artery: Secondary | ICD-10-CM | POA: Insufficient documentation

## 2021-02-05 DIAGNOSIS — E1149 Type 2 diabetes mellitus with other diabetic neurological complication: Secondary | ICD-10-CM | POA: Diagnosis not present

## 2021-02-05 LAB — BASIC METABOLIC PANEL
Anion gap: 8 (ref 5–15)
BUN: 17 mg/dL (ref 8–23)
CO2: 27 mmol/L (ref 22–32)
Calcium: 8.4 mg/dL — ABNORMAL LOW (ref 8.9–10.3)
Chloride: 102 mmol/L (ref 98–111)
Creatinine, Ser: 1.13 mg/dL (ref 0.61–1.24)
GFR, Estimated: >60 mL/min (ref >60)
Glucose, Bld: 194 mg/dL — ABNORMAL HIGH (ref 70–99)
Potassium: 4.9 mmol/L (ref 3.5–5.1)
Sodium: 137 mmol/L (ref 135–145)

## 2021-02-05 LAB — CBC WITH DIFFERENTIAL/PLATELET
Abs Immature Granulocytes: 0.04 10*3/uL (ref 0.00–0.07)
Basophils Absolute: 0.1 10*3/uL (ref 0.0–0.1)
Basophils Relative: 1 %
Eosinophils Absolute: 0.2 10*3/uL (ref 0.0–0.5)
Eosinophils Relative: 2 %
HCT: 35.6 % — ABNORMAL LOW (ref 39.0–52.0)
Hemoglobin: 11.8 g/dL — ABNORMAL LOW (ref 13.0–17.0)
Immature Granulocytes: 0 %
Lymphocytes Relative: 21 %
Lymphs Abs: 2.4 10*3/uL (ref 0.7–4.0)
MCH: 31.6 pg (ref 26.0–34.0)
MCHC: 33.1 g/dL (ref 30.0–36.0)
MCV: 95.4 fL (ref 80.0–100.0)
Monocytes Absolute: 1 10*3/uL (ref 0.1–1.0)
Monocytes Relative: 9 %
Neutro Abs: 7.5 10*3/uL (ref 1.7–7.7)
Neutrophils Relative %: 67 %
Platelets: 317 10*3/uL (ref 150–400)
RBC: 3.73 MIL/uL — ABNORMAL LOW (ref 4.22–5.81)
RDW: 14.2 % (ref 11.5–15.5)
WBC: 11.2 10*3/uL — ABNORMAL HIGH (ref 4.0–10.5)
nRBC: 0 % (ref 0.0–0.2)

## 2021-02-05 NOTE — Progress Notes (Signed)
Location:  [place] Room Number: 111-W Place of Service:  SNF (31)   CODE STATUS: DNR  No Known Allergies  Chief Complaint  Patient presents with   Acute Visit    Patient status.    HPI:  His cbg readings are elevated at upper 100's through 200's. He is presently taking januvia 25 mg daily. His appetite is variable. He denies any uncontrolled pain. He tells me that he eats when he wants to. There are no reports of insomnia and anxiety present.   Past Medical History:  Diagnosis Date   Hypertension    Benign Essential   Hypothyroidism    Obesity    Osteoarthritis    PVD (peripheral vascular disease) ([place])    Type 2 diabetes mellitus ([place])     Past Surgical History:  Procedure Laterality Date   APPENDECTOMY     TONSILLECTOMY      Social History   Socioeconomic History   Marital status: Single    Spouse name: Not on file   Number of children: Not on file   Years of education: 12   Highest education level: Not on file  Occupational History   Occupation: RETIRED  Tobacco Use   Smoking status: Former   Smokeless tobacco: Never  Scientific laboratory technician Use: Never used  Substance and Sexual Activity   Alcohol use: Not Currently   Drug use: Never   Sexual activity: Not Currently  Other Topics Concern   Not on file  Social History Narrative   Not on file   Social Determinants of Health   Financial Resource Strain: Not on file  Food Insecurity: Not on file  Transportation Needs: Not on file  Physical Activity: Not on file  Stress: Not on file  Social Connections: Not on file  Intimate Partner Violence: Not on file   Family History  Problem Relation Age of Onset   Osteoporosis Mother    Hypertension Mother    Cancer Mother    Heart attack Father    Heart attack Maternal Grandmother    Pneumonia Maternal Grandfather    Heart disease Paternal Grandmother    Heart disease Paternal Grandfather       VITAL SIGNS BP 128/73    Pulse 85   Temp 98.3 F (36.8 C)   Ht 5' 11"  (1.803 m)   Wt 166 lb 6.4 oz (75.5 kg)   SpO2 94%   BMI 23.21 kg/m   Outpatient Encounter Medications as of 02/05/2021  Medication Sig   aspirin EC 81 MG tablet Take 1 tablet (81 mg total) by mouth daily. Swallow whole.   atorvastatin (LIPITOR) 80 MG tablet Take 1 tablet (80 mg total) by mouth every evening.   clopidogrel (PLAVIX) 75 MG tablet Take 1 tablet (75 mg total) by mouth daily.   Ensure (ENSURE) Take by mouth 2 (two) times daily. due to poor meal intake and malnutrition   levothyroxine (SYNTHROID) 75 MCG tablet Take 75 mcg by mouth daily before breakfast.   metFORMIN (GLUCOPHAGE-XR) 500 MG 24 hr tablet Take 1,000 mg by mouth in the morning and at bedtime.   NON FORMULARY Diet: No Added Salt, ConCho   omeprazole (PRILOSEC OTC) 20 MG tablet Take 20 mg by mouth at bedtime.   ondansetron (ZOFRAN-ODT) 8 MG disintegrating tablet Take 8 mg by mouth every 6 (six) hours as needed for nausea.   senna-docusate (SENOKOT-S) 8.6-50 MG tablet Take 1 tablet by mouth daily.   sodium chloride  0.9 % injection 30cc; injection Special Instructions: Flush mid line with Normal Saline flush every shift.   triamcinolone cream (KENALOG) 0.1 % Apply 1 application topically 2 (two) times daily. Apply to all areas of psoriasis   [DISCONTINUED] Cholecalciferol (VITAMIN D3) 10 MCG (400 UNIT) CAPS Take 1 capsule by mouth daily.   [DISCONTINUED] Omega-3 Fatty Acids (FISH OIL PO) Take 2,500 mg by mouth in the morning and at bedtime.   [DISCONTINUED] sitaGLIPtin (JANUVIA) 25 MG tablet Take 25 mg by mouth daily.   [DISCONTINUED] vitamin C (ASCORBIC ACID) 500 MG tablet Take 500 mg by mouth daily.   No facility-administered encounter medications on file as of 02/05/2021.     SIGNIFICANT DIAGNOSTIC EXAMS   PREVIOUS  01-18-21: ct of head:  Brain: Mild chronic ischemic white matter disease is noted. Mild diffuse cortical atrophy is noted. No mass effect or midline  shift is noted. Ventricular size is within normal limits. There is no evidence of mass lesion, hemorrhage or acute infarction. Vascular: No hyperdense vessel or unexpected calcification. Skull: Normal. Negative for fracture or focal lesion. Sinuses/Orbits: No acute finding.  01-18-21: MRI/MRA of brain MRI: 1. Acute infarct in the posterolateral right medulla, likely right PICA territory. Additional acute right PCA territory infarcts in the right occipital lobe and posterior right corpus callosum. Associated mild edema without mass effect. 2. Developing encephalomalacia in the areas of previously seen infarct in the inferior right cerebellum. 3. Remote lacunar infarcts in the left thalamus, right caudate, and bilateral corona radiata. 4. Moderate chronic microvascular disease. MRA: 1. Severe stenosis of bilateral P2 PCAs with poor flow related signal in the more distal PCAs. Also, severe stenosis of the distal left M1 MCA, bilateral M2 MCA branches and bilateral A2 ACAs. 2. Similar multifocal moderate narrowing of the distal right intradural vertebral artery. Similar flow related signal in the proximal right PICA, but not the more distal right PICA.  01-19-21: 2-d echo:  1. Left ventricular ejection fraction, by estimation, is 70 to 75%. The  left ventricle has hyperdynamic function. The left ventricle has no  regional wall motion abnormalities. There is mild left ventricular  hypertrophy. Left ventricular diastolic  parameters are indeterminate.   01-28-21: chest x-ray:  1.  Prior CABG.  Heart size normal. 2. Mild left base subsegmental atelectasis. Tiny left pleural effusion cannot be excluded.  3. Right posterior eighth rib fracture. This may be acute. No pneumothorax.  01-28-21: KUB 1. Nonobstructive bowel gas pattern. 2. Prominent colonic stool volume. Imaging appearance raises the question of chronic constipation.  01-28-21: EKG Sinus rhythm with first degree AV block; PAC: R BBB  NO NEW  EXAMS.   LABS REVIEWED: PREVIOUS   01-18-21: wbc 13.5; hgb 13.2; hct 40.5; mcv 96.4 plt 377; glucose 260; bun 22; creat 1.19; k+ 4.9; na++ 137; ca 9.6 GFR>60; alk phos 161;  albumin 3.6; urine culture: staphylococcus aureus 01-20-21: wbc 11.6; hgb 12.8; hct 39.0; mcv 96.5 plt 333; glucose 207; bun 26; creat 1.22; k+ 5.1; na++ 139; ca 9.2 GFR>60; hgb a1c 8.3; chol 217; LDL 150; trig 151; hdl 37 01-21-21: wbc 13.5; hgb 13.6; hct 41.6; mcv 995.9 plt 390; glucose 243; bun 31;creat 1.28; k+ 5.1; na++ 137; ca 9.5 GFR 59; alk phos 134; albumin 3.2 mag 1.8   01-27-21: wbc 20.8; hgb 14.6; hct 43.3; mcv 92.7 pt 468; glucose 414; bun 41; creat 1.58; k+ 5.6; na++ 131; ca 9.5; GFR 45; alk phos 142; albumin 3.4 troponin #1: 10; #2: 13 01-28-21: wbc  17.3; hgb 13.5; hct 40.4; mcv 94.0 plt 437; glucose 292; bun 42; creat 1.39; k+ 4.9; na++ 130; ca 9.2 GFR 52; troponin #3 12. Mag 1.8   NO NEW LABS.   Review of Systems  Constitutional:  Negative for malaise/fatigue.  Respiratory:  Negative for cough and shortness of breath.   Cardiovascular:  Negative for chest pain, palpitations and leg swelling.  Gastrointestinal:  Negative for abdominal pain, constipation and heartburn.  Musculoskeletal:  Negative for back pain, joint pain and myalgias.  Skin: Negative.   Neurological:  Negative for dizziness.  Psychiatric/Behavioral:  The patient is not nervous/anxious.    Physical Exam Constitutional:      General: He is not in acute distress.    Appearance: He is well-developed. He is not diaphoretic.  Neck:     Thyroid: No thyromegaly.  Cardiovascular:     Rate and Rhythm: Normal rate and regular rhythm.     Pulses: Normal pulses.     Heart sounds: Normal heart sounds.  Pulmonary:     Effort: Pulmonary effort is normal. No respiratory distress.     Breath sounds: Normal breath sounds.  Abdominal:     General: Bowel sounds are normal. There is no distension.     Palpations: Abdomen is soft.     Tenderness: There is  no abdominal tenderness.  Musculoskeletal:        General: Normal range of motion.     Cervical back: Neck supple.     Right lower leg: No edema.     Left lower leg: No edema.  Lymphadenopathy:     Cervical: No cervical adenopathy.  Skin:    General: Skin is warm and dry.  Neurological:     Mental Status: He is alert and oriented to person, place, and time.  Psychiatric:        Mood and Affect: Mood normal.     ASSESSMENT/ PLAN:  TODAY  Type 2 diabetes mellitus with neurological complications  Will increase januvia to 50 mg daily  Will monitor his status.    Ok Edwards NP [facility]  Contact [phone] Monday through Friday 8am- 5pm  After hours call [phone]

## 2021-02-10 ENCOUNTER — Non-Acute Institutional Stay (SKILLED_NURSING_FACILITY): Payer: Medicare Other | Admitting: Adult Health

## 2021-02-10 ENCOUNTER — Ambulatory Visit (HOSPITAL_COMMUNITY)
Admission: RE | Admit: 2021-02-10 | Discharge: 2021-02-10 | Disposition: A | Discharge status: Home / Self Care | Payer: Medicare Other | Source: Ambulatory Visit | Attending: Internal Medicine | Admitting: Internal Medicine

## 2021-02-10 ENCOUNTER — Encounter: Payer: Self-pay | Admitting: Adult Health

## 2021-02-10 DIAGNOSIS — R531 Weakness: Secondary | ICD-10-CM | POA: Diagnosis not present

## 2021-02-10 DIAGNOSIS — I639 Cerebral infarction, unspecified: Secondary | ICD-10-CM

## 2021-02-10 DIAGNOSIS — R29898 Other symptoms and signs involving the musculoskeletal system: Secondary | ICD-10-CM | POA: Diagnosis not present

## 2021-02-10 DIAGNOSIS — H547 Unspecified visual loss: Secondary | ICD-10-CM | POA: Diagnosis not present

## 2021-02-10 IMAGING — CT CT HEAD W/O CM
3 series · 16 of 47 positions shown, 19 images · non-contrast
Comparison: [DATE]

CLINICAL DATA: Loss of vision, left-sided weakness

EXAM:
CT HEAD WITHOUT CONTRAST
TECHNIQUE: Contiguous axial images were obtained from the base of the skull
through the vertex without intravenous contrast.

[Series 2: head w o · axial · 0.39mm/px · z∈[-5,+125]mm · 10 of 32 slices shown, 13 images]
[im 3/32  brain]
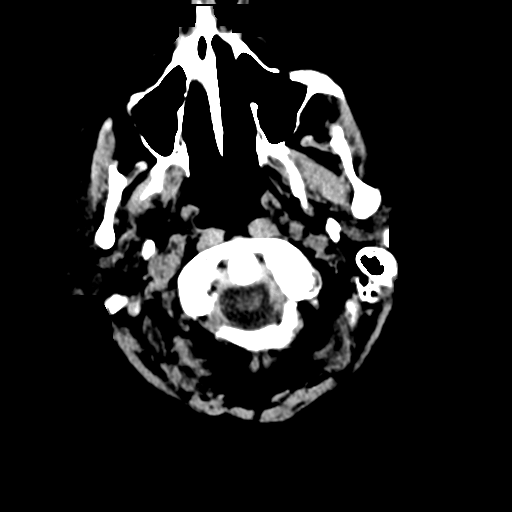
[im 3/32  bone]
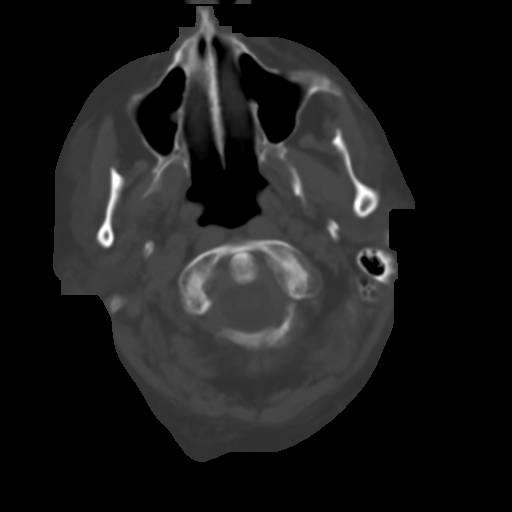
[im 6/32  brain]
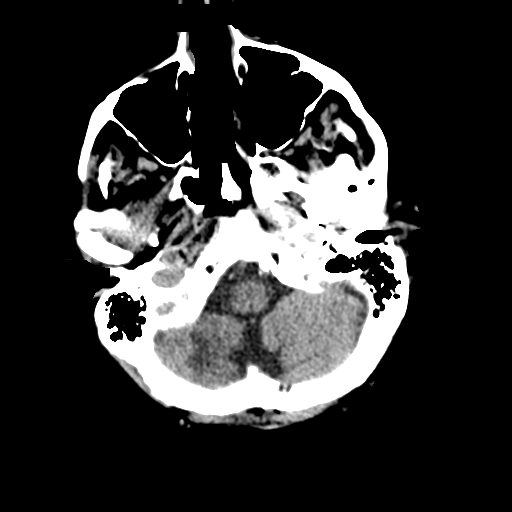
[im 9/32  brain]
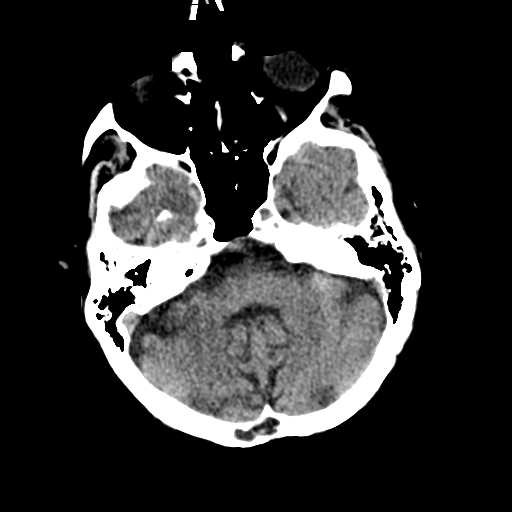
[im 11/32  brain]
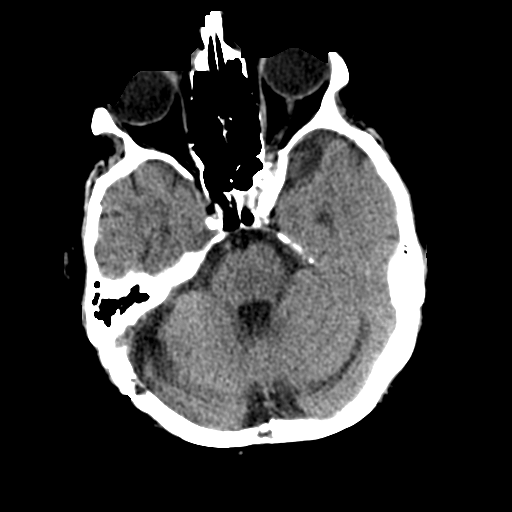
[im 14/32  brain]
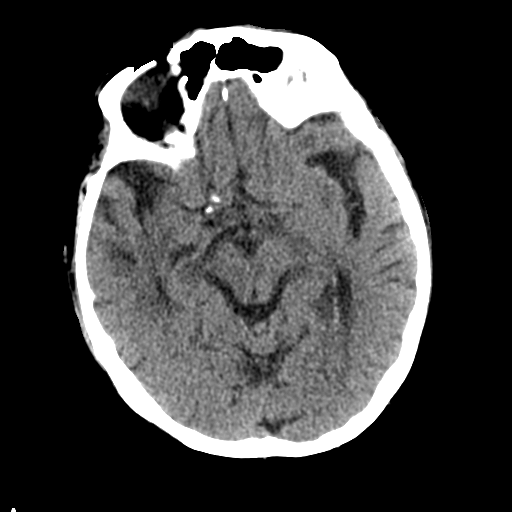
[im 14/32  bone]
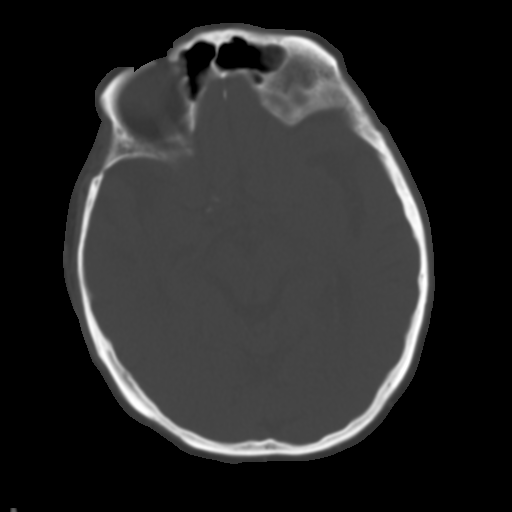
[im 18/32  brain]
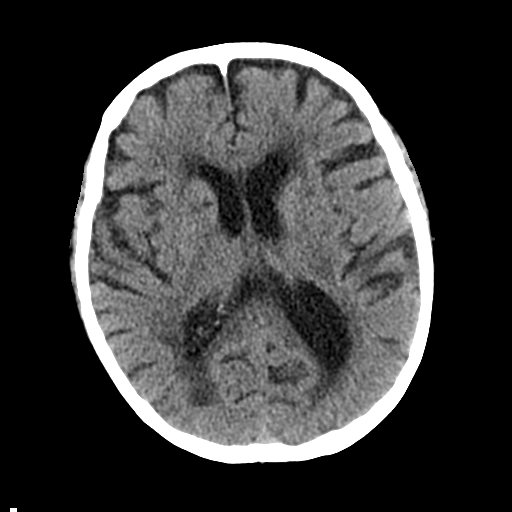
[im 21/32  brain]
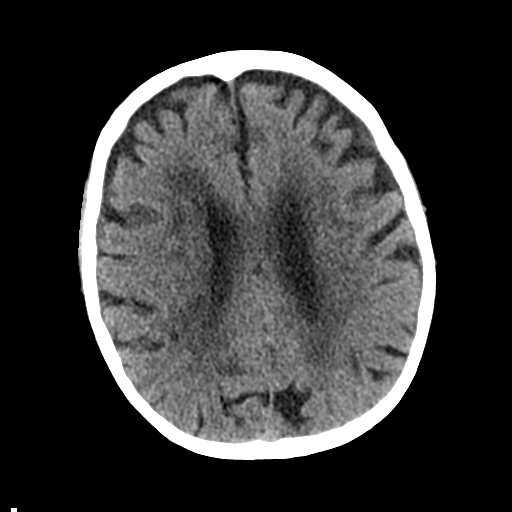
[im 24/32  brain]
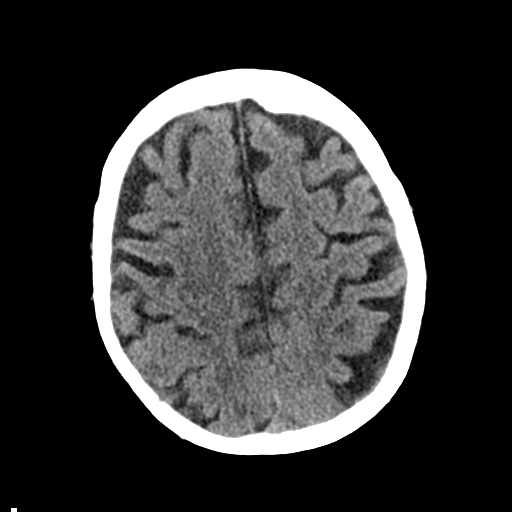
[im 26/32  brain]
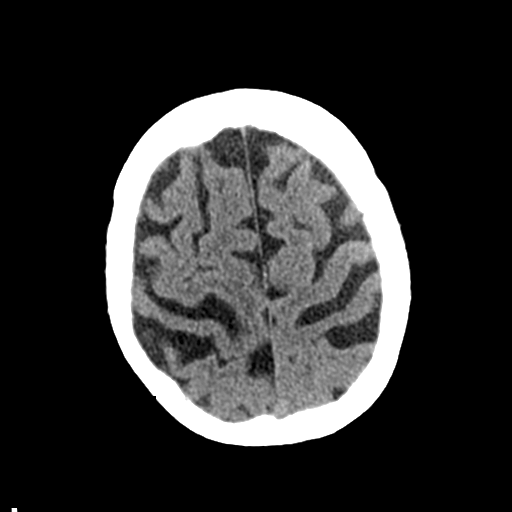
[im 26/32  bone]
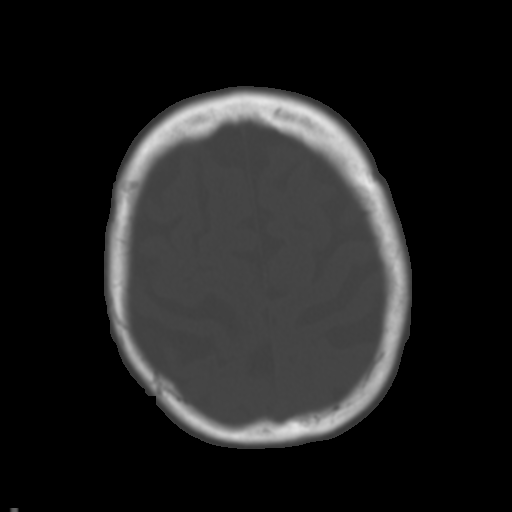
[im 29/32  brain]
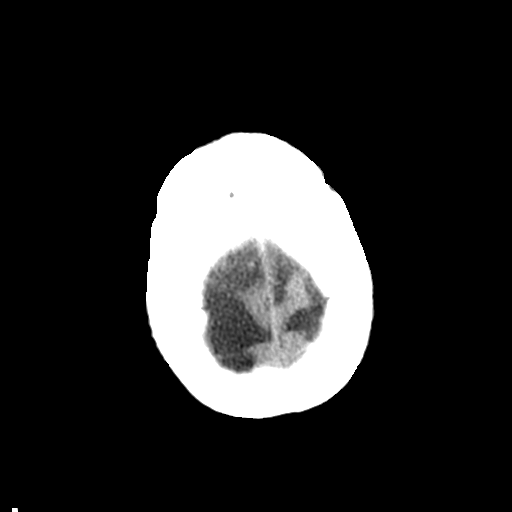

[Series 4: coronal soft · coronal · 0.34mm/px · 3 of 67 slices shown]
[im 23/67  brain]
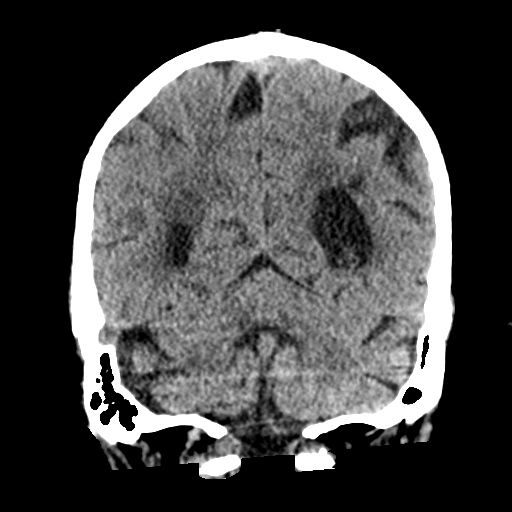
[im 30/67  brain]
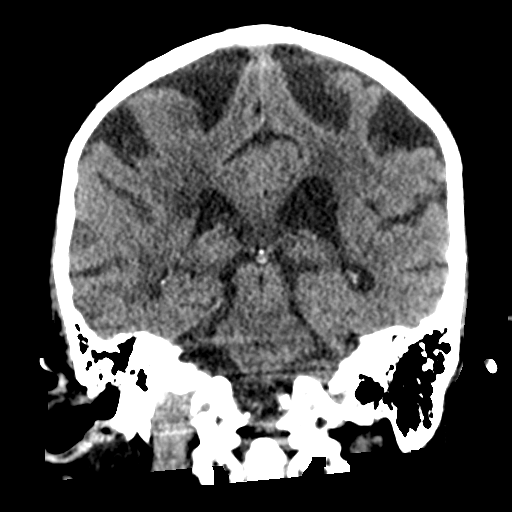
[im 37/67  brain]
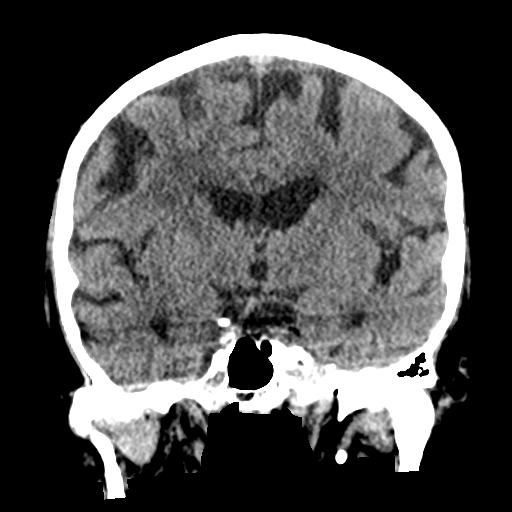

[Series 5: sagittal soft · sagittal · 0.35mm/px · 3 of 58 slices shown]
[im 20/58  brain]
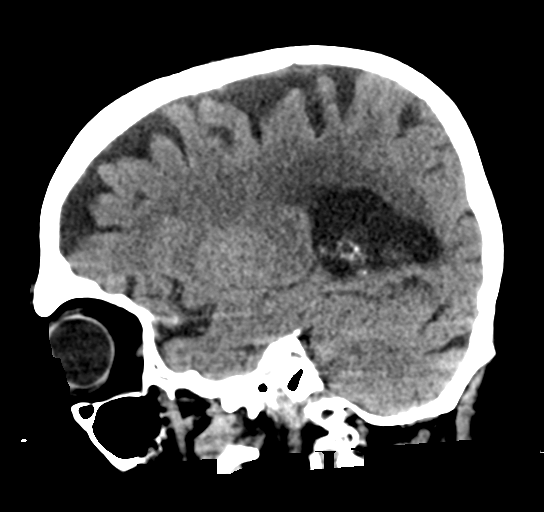
[im 29/58  brain]
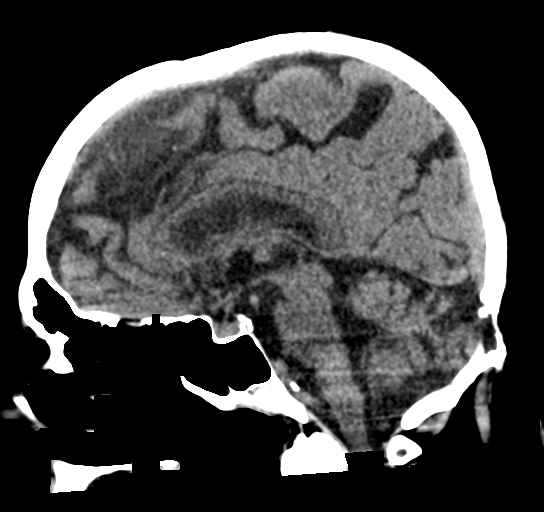
[im 39/58  brain]
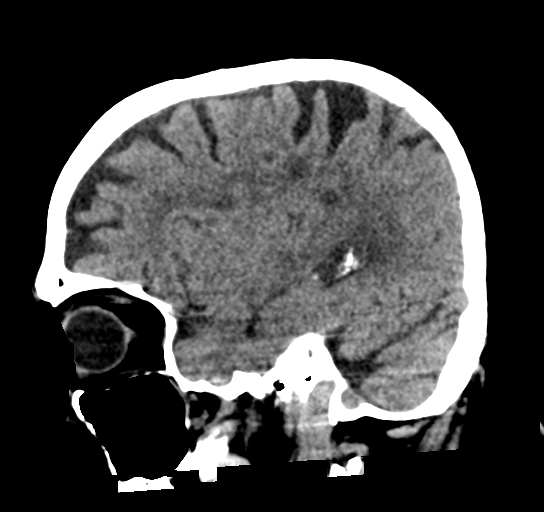

[16 of 47 positions shown; findings below may reference images not displayed]

FINDINGS: Brain: No evidence of acute infarction, hemorrhage, extra-axial
collection, ventriculomegaly, or mass effect. Tiny old right basal
ganglia lacunar infarct. Old right cerebellar infarct. Generalized
cerebral atrophy. Periventricular white matter low attenuation
likely secondary to microangiopathy.

Vascular: Cerebrovascular atherosclerotic calcifications are noted.

Skull: Negative for fracture or focal lesion.

Sinuses/Orbits: Visualized portions of the orbits are unremarkable.
Visualized portions of the paranasal sinuses are unremarkable.
Visualized portions of the mastoid air cells are unremarkable.

Other: None.
IMPRESSION: No acute intracranial pathology.

## 2021-02-10 NOTE — Progress Notes (Signed)
Location:  [facility] Room Number: 111 Place of Service:  SNF (31)   CODE STATUS: DNR  No Known Allergies  Chief Complaint  Patient presents with   Acute Visit    Patient status    HPI:  Staff reports that he is having worsening left side weakness. He states that the vision in his left eye is worse; has increased weakness in his left extremities. Denies any chest pain; no vertigo. He does have a history of recent CVA.   Past Medical History:  Diagnosis Date   Hypertension    Benign Essential   Hypothyroidism    Obesity    Osteoarthritis    PVD (peripheral vascular disease) ([place])    Type 2 diabetes mellitus ([place])     Past Surgical History:  Procedure Laterality Date   APPENDECTOMY     TONSILLECTOMY      Social History   Socioeconomic History   Marital status: Single    Spouse name: Not on file   Number of children: Not on file   Years of education: 12   Highest education level: Not on file  Occupational History   Occupation: RETIRED  Tobacco Use   Smoking status: Former   Smokeless tobacco: Never  Scientific laboratory technician Use: Never used  Substance and Sexual Activity   Alcohol use: Not Currently   Drug use: Never   Sexual activity: Not Currently  Other Topics Concern   Not on file  Social History Narrative   Not on file   Social Determinants of Health   Financial Resource Strain: Not on file  Food Insecurity: Not on file  Transportation Needs: Not on file  Physical Activity: Not on file  Stress: Not on file  Social Connections: Not on file  Intimate Partner Violence: Not on file   Family History  Problem Relation Age of Onset   Osteoporosis Mother    Hypertension Mother    Cancer Mother    Heart attack Father    Heart attack Maternal Grandmother    Pneumonia Maternal Grandfather    Heart disease Paternal Grandmother    Heart disease Paternal Grandfather       VITAL SIGNS BP (!) 146/70   Pulse 74   Temp 97.8 F  (36.6 C)   Resp 20   Ht 5' 11"  (1.803 m)   Wt 166 lb 6.4 oz (75.5 kg)   SpO2 96%   BMI 23.21 kg/m   Outpatient Encounter Medications as of 02/10/2021  Medication Sig   aspirin EC 81 MG tablet Take 1 tablet (81 mg total) by mouth daily. Swallow whole.   atorvastatin (LIPITOR) 80 MG tablet Take 1 tablet (80 mg total) by mouth every evening.   clopidogrel (PLAVIX) 75 MG tablet Take 1 tablet (75 mg total) by mouth daily.   Ensure (ENSURE) Take by mouth 2 (two) times daily. due to poor meal intake and malnutrition   levothyroxine (SYNTHROID) 75 MCG tablet Take 75 mcg by mouth daily before breakfast.   metFORMIN (GLUCOPHAGE-XR) 500 MG 24 hr tablet Take 1,000 mg by mouth in the morning and at bedtime.   NON FORMULARY Diet: No Added Salt, ConCho   omeprazole (PRILOSEC OTC) 20 MG tablet Take 20 mg by mouth at bedtime.   ondansetron (ZOFRAN-ODT) 8 MG disintegrating tablet Take 8 mg by mouth every 6 (six) hours as needed for nausea.   senna-docusate (SENOKOT-S) 8.6-50 MG tablet Take 1 tablet by mouth daily.   sitaGLIPtin (  JANUVIA) 50 MG tablet Take 50 mg by mouth daily.   sodium chloride 0.9 % injection 30cc; injection Special Instructions: Flush mid line with Normal Saline flush every shift.   triamcinolone cream (KENALOG) 0.1 % Apply 1 application topically 2 (two) times daily. Apply to all areas of psoriasis   No facility-administered encounter medications on file as of 02/10/2021.     SIGNIFICANT DIAGNOSTIC EXAMS   PREVIOUS  01-18-21: ct of head:  Brain: Mild chronic ischemic white matter disease is noted. Mild diffuse cortical atrophy is noted. No mass effect or midline shift is noted. Ventricular size is within normal limits. There is no evidence of mass lesion, hemorrhage or acute infarction. Vascular: No hyperdense vessel or unexpected calcification. Skull: Normal. Negative for fracture or focal lesion. Sinuses/Orbits: No acute finding.  01-18-21: MRI/MRA of brain MRI: 1. Acute  infarct in the posterolateral right medulla, likely right PICA territory. Additional acute right PCA territory infarcts in the right occipital lobe and posterior right corpus callosum. Associated mild edema without mass effect. 2. Developing encephalomalacia in the areas of previously seen infarct in the inferior right cerebellum. 3. Remote lacunar infarcts in the left thalamus, right caudate, and bilateral corona radiata. 4. Moderate chronic microvascular disease. MRA: 1. Severe stenosis of bilateral P2 PCAs with poor flow related signal in the more distal PCAs. Also, severe stenosis of the distal left M1 MCA, bilateral M2 MCA branches and bilateral A2 ACAs. 2. Similar multifocal moderate narrowing of the distal right intradural vertebral artery. Similar flow related signal in the proximal right PICA, but not the more distal right PICA.  01-19-21: 2-d echo:  1. Left ventricular ejection fraction, by estimation, is 70 to 75%. The  left ventricle has hyperdynamic function. The left ventricle has no  regional wall motion abnormalities. There is mild left ventricular  hypertrophy. Left ventricular diastolic  parameters are indeterminate.   01-28-21: chest x-ray:  1.  Prior CABG.  Heart size normal. 2. Mild left base subsegmental atelectasis. Tiny left pleural effusion cannot be excluded.  3. Right posterior eighth rib fracture. This may be acute. No pneumothorax.  01-28-21: KUB 1. Nonobstructive bowel gas pattern. 2. Prominent colonic stool volume. Imaging appearance raises the question of chronic constipation.  01-28-21: EKG Sinus rhythm with first degree AV block; PAC: R BBB  NO NEW EXAMS.   LABS REVIEWED: PREVIOUS   01-18-21: wbc 13.5; hgb 13.2; hct 40.5; mcv 96.4 plt 377; glucose 260; bun 22; creat 1.19; k+ 4.9; na++ 137; ca 9.6 GFR>60; alk phos 161;  albumin 3.6; urine culture: staphylococcus aureus 01-20-21: wbc 11.6; hgb 12.8; hct 39.0; mcv 96.5 plt 333; glucose 207; bun 26; creat 1.22; k+  5.1; na++ 139; ca 9.2 GFR>60; hgb a1c 8.3; chol 217; LDL 150; trig 151; hdl 37 01-21-21: wbc 13.5; hgb 13.6; hct 41.6; mcv 995.9 plt 390; glucose 243; bun 31;creat 1.28; k+ 5.1; na++ 137; ca 9.5 GFR 59; alk phos 134; albumin 3.2 mag 1.8   01-27-21: wbc 20.8; hgb 14.6; hct 43.3; mcv 92.7 pt 468; glucose 414; bun 41; creat 1.58; k+ 5.6; na++ 131; ca 9.5; GFR 45; alk phos 142; albumin 3.4 troponin #1: 10; #2: 13 01-28-21: wbc 17.3; hgb 13.5; hct 40.4; mcv 94.0 plt 437; glucose 292; bun 42; creat 1.39; k+ 4.9; na++ 130; ca 9.2 GFR 52; troponin #3 12. Mag 1.8   NO NEW LABS.   Review of Systems  Constitutional:  Negative for malaise/fatigue.  Eyes:  Decreased vision left eye   Respiratory:  Negative for cough and shortness of breath.   Cardiovascular:  Negative for chest pain, palpitations and leg swelling.  Gastrointestinal:  Negative for abdominal pain, constipation and heartburn.  Musculoskeletal:  Negative for back pain, joint pain and myalgias.  Skin: Negative.   Neurological:  Positive for weakness. Negative for dizziness.  Psychiatric/Behavioral:  The patient is not nervous/anxious.    Physical Exam Constitutional:      General: He is not in acute distress.    Appearance: He is well-developed. He is not diaphoretic.  Eyes:     Comments: Has decreased peripheral vision on the left side   Neck:     Thyroid: No thyromegaly.  Cardiovascular:     Rate and Rhythm: Normal rate and regular rhythm.     Heart sounds: Normal heart sounds.  Pulmonary:     Effort: Pulmonary effort is normal. No respiratory distress.     Breath sounds: Normal breath sounds.  Abdominal:     General: Bowel sounds are normal. There is no distension.     Palpations: Abdomen is soft.     Tenderness: There is no abdominal tenderness.  Musculoskeletal:     Cervical back: Neck supple.     Right lower leg: No edema.     Left lower leg: No edema.     Comments: Has increased weakness on left upper and lower  extremity   Lymphadenopathy:     Cervical: No cervical adenopathy.  Skin:    General: Skin is warm and dry.  Neurological:     Mental Status: He is alert. Mental status is at baseline.  Psychiatric:        Mood and Affect: Mood normal.     ASSESSMENT/ PLAN:  TODAY  Acute stroke due to ischemia: question progression of cva; will get a stat ct of head without contrast. Will monitor his status; and will address as indicated.    Ok Edwards NP [facility]  Contact [phone] Monday through Friday 8am- 5pm  After hours call [phone]

## 2021-02-25 ENCOUNTER — Other Ambulatory Visit: Payer: Self-pay | Admitting: Adult Health

## 2021-03-02 ENCOUNTER — Non-Acute Institutional Stay (SKILLED_NURSING_FACILITY): Payer: Medicare Other | Admitting: Adult Health

## 2021-03-02 ENCOUNTER — Encounter: Payer: Self-pay | Admitting: Adult Health

## 2021-03-02 DIAGNOSIS — E118 Type 2 diabetes mellitus with unspecified complications: Secondary | ICD-10-CM | POA: Diagnosis not present

## 2021-03-02 DIAGNOSIS — E079 Disorder of thyroid, unspecified: Secondary | ICD-10-CM | POA: Diagnosis not present

## 2021-03-02 DIAGNOSIS — I69359 Hemiplegia and hemiparesis following cerebral infarction affecting unspecified side: Secondary | ICD-10-CM | POA: Diagnosis not present

## 2021-03-02 DIAGNOSIS — I639 Cerebral infarction, unspecified: Secondary | ICD-10-CM | POA: Diagnosis not present

## 2021-03-02 DIAGNOSIS — E1149 Type 2 diabetes mellitus with other diabetic neurological complication: Secondary | ICD-10-CM | POA: Diagnosis not present

## 2021-03-02 DIAGNOSIS — I10 Essential (primary) hypertension: Secondary | ICD-10-CM | POA: Diagnosis not present

## 2021-03-02 DIAGNOSIS — I69919 Unspecified symptoms and signs involving cognitive functions following unspecified cerebrovascular disease: Secondary | ICD-10-CM | POA: Diagnosis not present

## 2021-03-02 DIAGNOSIS — I63331 Cerebral infarction due to thrombosis of right posterior cerebral artery: Secondary | ICD-10-CM | POA: Diagnosis not present

## 2021-03-02 NOTE — Progress Notes (Signed)
Location:  [facility] Room Number: 111-W Place of Service:  SNF (31)   CODE STATUS: DNR  No Known Allergies  Chief Complaint  Patient presents with   Medical Management of Chronic Issues            Acute stroke due to ischemia:  Thyroid disease:  Type 2 diabetes mellitus with neurological manifestations    HPI:  He is a 75 year old man being seen for the management of his chronic illnesses: Acute stroke due to ischemia:  Thyroid disease:  Type 2 diabetes mellitus with neurological manifestations. There are no reports of uncontrolled pain; no changes in appetite; no reports of depressive thoughts.   Past Medical History:  Diagnosis Date   Hypertension    Benign Essential   Hypothyroidism    Obesity    Osteoarthritis    PVD (peripheral vascular disease) ([place])    Type 2 diabetes mellitus ([place])     Past Surgical History:  Procedure Laterality Date   APPENDECTOMY     TONSILLECTOMY      Social History   Socioeconomic History   Marital status: Single    Spouse name: Not on file   Number of children: Not on file   Years of education: 12   Highest education level: Not on file  Occupational History   Occupation: RETIRED  Tobacco Use   Smoking status: Former   Smokeless tobacco: Never  Scientific laboratory technician Use: Never used  Substance and Sexual Activity   Alcohol use: Not Currently   Drug use: Never   Sexual activity: Not Currently  Other Topics Concern   Not on file  Social History Narrative   Not on file   Social Determinants of Health   Financial Resource Strain: Not on file  Food Insecurity: Not on file  Transportation Needs: Not on file  Physical Activity: Not on file  Stress: Not on file  Social Connections: Not on file  Intimate Partner Violence: Not on file   Family History  Problem Relation Age of Onset   Osteoporosis Mother    Hypertension Mother    Cancer Mother    Heart attack Father    Heart attack Maternal  Grandmother    Pneumonia Maternal Grandfather    Heart disease Paternal Grandmother    Heart disease Paternal Grandfather       VITAL SIGNS BP 120/81   Pulse 86   Temp 97.8 F (36.6 C)   Ht 5' 11"  (1.803 m)   Wt 161 lb 9.6 oz (73.3 kg)   SpO2 96%   BMI 22.54 kg/m   Outpatient Encounter Medications as of 03/02/2021  Medication Sig   aspirin EC 81 MG tablet Take 1 tablet (81 mg total) by mouth daily. Swallow whole.   atorvastatin (LIPITOR) 80 MG tablet Take 1 tablet (80 mg total) by mouth every evening.   feeding supplement, GLUCERNA SHAKE, (GLUCERNA SHAKE) LIQD Take 237 mLs by mouth in the morning and at bedtime. 9 am and 9 pm   levothyroxine (SYNTHROID) 75 MCG tablet Take 75 mcg by mouth daily before breakfast.   metFORMIN (GLUCOPHAGE) 1000 MG tablet Take 1,000 mg by mouth 2 (two) times daily with a meal.   miconazole (ZEASORB-AF) 2 % powder Apply 1 application topically as needed for itching (to groin and scrotum area substituted with shower to).   NON FORMULARY Diet: No Added Salt, ConCho, Dysphagia 3 diet with Nectar thick liquids   omeprazole (PRILOSEC OTC)  20 MG tablet Take 20 mg by mouth at bedtime.   ondansetron (ZOFRAN-ODT) 8 MG disintegrating tablet Take 8 mg by mouth every 6 (six) hours as needed for nausea.   senna-docusate (SENOKOT-S) 8.6-50 MG tablet Take 1 tablet by mouth daily.   sitaGLIPtin (JANUVIA) 50 MG tablet Take 50 mg by mouth daily.   triamcinolone cream (KENALOG) 0.1 % Apply 1 application topically 2 (two) times daily. Apply to all areas of psoriasis   [DISCONTINUED] clopidogrel (PLAVIX) 75 MG tablet Take 1 tablet (75 mg total) by mouth daily.   [DISCONTINUED] Ensure (ENSURE) Take by mouth 2 (two) times daily. due to poor meal intake and malnutrition   [DISCONTINUED] metFORMIN (GLUCOPHAGE-XR) 500 MG 24 hr tablet Take 1,000 mg by mouth in the morning and at bedtime.   [DISCONTINUED] sodium chloride 0.9 % injection 30cc; injection Special Instructions: Flush  mid line with Normal Saline flush every shift.   No facility-administered encounter medications on file as of 03/02/2021.     SIGNIFICANT DIAGNOSTIC EXAMS  PREVIOUS  01-18-21: ct of head:  Brain: Mild chronic ischemic white matter disease is noted. Mild diffuse cortical atrophy is noted. No mass effect or midline shift is noted. Ventricular size is within normal limits. There is no evidence of mass lesion, hemorrhage or acute infarction. Vascular: No hyperdense vessel or unexpected calcification. Skull: Normal. Negative for fracture or focal lesion. Sinuses/Orbits: No acute finding.  01-18-21: MRI/MRA of brain MRI: 1. Acute infarct in the posterolateral right medulla, likely right PICA territory. Additional acute right PCA territory infarcts in the right occipital lobe and posterior right corpus callosum. Associated mild edema without mass effect. 2. Developing encephalomalacia in the areas of previously seen infarct in the inferior right cerebellum. 3. Remote lacunar infarcts in the left thalamus, right caudate, and bilateral corona radiata. 4. Moderate chronic microvascular disease. MRA: 1. Severe stenosis of bilateral P2 PCAs with poor flow related signal in the more distal PCAs. Also, severe stenosis of the distal left M1 MCA, bilateral M2 MCA branches and bilateral A2 ACAs. 2. Similar multifocal moderate narrowing of the distal right intradural vertebral artery. Similar flow related signal in the proximal right PICA, but not the more distal right PICA.  01-19-21: 2-d echo:  1. Left ventricular ejection fraction, by estimation, is 70 to 75%. The  left ventricle has hyperdynamic function. The left ventricle has no  regional wall motion abnormalities. There is mild left ventricular  hypertrophy. Left ventricular diastolic  parameters are indeterminate.   01-28-21: chest x-ray:  1.  Prior CABG.  Heart size normal. 2. Mild left base subsegmental atelectasis. Tiny left pleural effusion cannot  be excluded.  3. Right posterior eighth rib fracture. This may be acute. No pneumothorax.  01-28-21: KUB 1. Nonobstructive bowel gas pattern. 2. Prominent colonic stool volume. Imaging appearance raises the question of chronic constipation.  01-28-21: EKG Sinus rhythm with first degree AV block; PAC: R BBB  NO NEW EXAMS.   LABS REVIEWED: PREVIOUS   01-18-21: wbc 13.5; hgb 13.2; hct 40.5; mcv 96.4 plt 377; glucose 260; bun 22; creat 1.19; k+ 4.9; na++ 137; ca 9.6 GFR>60; alk phos 161;  albumin 3.6; urine culture: staphylococcus aureus 01-20-21: wbc 11.6; hgb 12.8; hct 39.0; mcv 96.5 plt 333; glucose 207; bun 26; creat 1.22; k+ 5.1; na++ 139; ca 9.2 GFR>60; hgb a1c 8.3; chol 217; LDL 150; trig 151; hdl 37 01-21-21: wbc 13.5; hgb 13.6; hct 41.6; mcv 95.9 plt 390; glucose 243; bun 31;creat 1.28; k+ 5.1; na++  137; ca 9.5 GFR 59; alk phos 134; albumin 3.2 mag 1.8   01-27-21: wbc 20.8; hgb 14.6; hct 43.3; mcv 92.7 pt 468; glucose 414; bun 41; creat 1.58; k+ 5.6; na++ 131; ca 9.5; GFR 45; alk phos 142; albumin 3.4 troponin #1: 10; #2: 13 01-28-21: wbc 17.3; hgb 13.5; hct 40.4; mcv 94.0 plt 437; glucose 292; bun 42; creat 1.39; k+ 4.9; na++ 130; ca 9.2 GFR 52; troponin #3 12. Mag 1.8   NO NEW LABS.   Review of Systems  Constitutional:  Negative for malaise/fatigue.  Respiratory:  Negative for cough and shortness of breath.   Cardiovascular:  Negative for chest pain, palpitations and leg swelling.  Gastrointestinal:  Negative for abdominal pain, constipation and heartburn.  Musculoskeletal:  Negative for back pain, joint pain and myalgias.  Skin: Negative.   Neurological:  Negative for dizziness.  Psychiatric/Behavioral:  The patient is not nervous/anxious.    Physical Exam Constitutional:      General: He is not in acute distress.    Appearance: He is well-developed. He is not diaphoretic.  Neck:     Thyroid: No thyromegaly.  Cardiovascular:     Rate and Rhythm: Normal rate and regular rhythm.      Pulses: Normal pulses.     Heart sounds: Normal heart sounds.  Pulmonary:     Effort: Pulmonary effort is normal. No respiratory distress.     Breath sounds: Normal breath sounds.  Abdominal:     General: Bowel sounds are normal. There is no distension.     Palpations: Abdomen is soft.     Tenderness: There is no abdominal tenderness.  Musculoskeletal:     Cervical back: Neck supple.     Right lower leg: No edema.     Left lower leg: No edema.     Comments: Has increased weakness on left upper and lower extremity   Lymphadenopathy:     Cervical: No cervical adenopathy.  Skin:    General: Skin is warm and dry.  Neurological:     Mental Status: He is alert. Mental status is at baseline.  Psychiatric:        Mood and Affect: Mood normal.      ASSESSMENT/ PLAN:  TODAY  Acute stroke due to ischemia: is stable will continue therapy as directed: will continue asa 81 mg daily has completed plavix  2. Thyroid disease: is stable will continue synthroid 75 mcg daily   3. Type 2 diabetes mellitus with neurological manifestations: is stable hgb a1c 8.3; will continue metformin xr 1 gm twice daily will continue januvia 25 mg daily   PREVIOUS   4 Mild protein calorie malnutrition: is stable albumin 3.2 will continue supplements as directed  5. Hyperlipidemia associated with type 2 diabetes mellitus: LDL 150 will continue lipitor 80 mg daily   6. Hypertension associated with type 2 diabetes mellitus: is without change: b/p 120/81: will begin cozaar 12.5 mg daily will hold for systolic b/p: <093.   7. GERD without esophagitis: is stable will continue prilosec 20 mg daily   8. Chronic constipation: is worse: will begin senna s daily; MOM and dulcolax suppository given today.      Ok Edwards NP [facility]  Contact [phone] Monday through Friday 8am- 5pm  After hours call [phone]

## 2021-03-11 ENCOUNTER — Ambulatory Visit (HOSPITAL_COMMUNITY)
Admission: RE | Admit: 2021-03-11 | Discharge: 2021-03-11 | Disposition: A | Discharge status: Home / Self Care | Payer: Medicare Other | Source: Ambulatory Visit | Attending: Neurology | Admitting: Neurology

## 2021-03-11 ENCOUNTER — Encounter: Payer: Self-pay | Admitting: Adult Health

## 2021-03-11 ENCOUNTER — Non-Acute Institutional Stay (SKILLED_NURSING_FACILITY): Payer: Medicare Other | Admitting: Adult Health

## 2021-03-11 DIAGNOSIS — E1159 Type 2 diabetes mellitus with other circulatory complications: Secondary | ICD-10-CM

## 2021-03-11 DIAGNOSIS — E441 Mild protein-calorie malnutrition: Secondary | ICD-10-CM

## 2021-03-11 DIAGNOSIS — I152 Hypertension secondary to endocrine disorders: Secondary | ICD-10-CM

## 2021-03-11 DIAGNOSIS — I639 Cerebral infarction, unspecified: Secondary | ICD-10-CM | POA: Diagnosis not present

## 2021-03-11 DIAGNOSIS — I700 Atherosclerosis of aorta: Secondary | ICD-10-CM | POA: Diagnosis not present

## 2021-03-11 DIAGNOSIS — I69359 Hemiplegia and hemiparesis following cerebral infarction affecting unspecified side: Secondary | ICD-10-CM | POA: Diagnosis not present

## 2021-03-11 IMAGING — MR MR HEAD W/O CM
11 of 12 series · 40 of 48 positions shown · non-contrast
Comparison: None.

CLINICAL DATA: Hemiplegia and hemiparesis following cerebral
infarction affecting unspecified side.

EXAM:
MRI HEAD WITHOUT CONTRAST
TECHNIQUE: Multiplanar, multiecho pulse sequences of the brain and surrounding
structures were obtained without intravenous contrast.

[Series 5: DWI · axial · 4.0mm · 0.88mm/px · z∈[-52,+86]mm · 4 of 36 slices shown (1 of 6)]
[im 1/36]
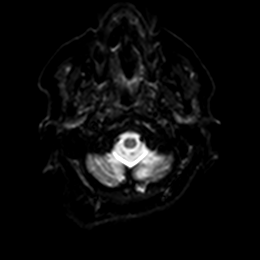
[im 12/36]
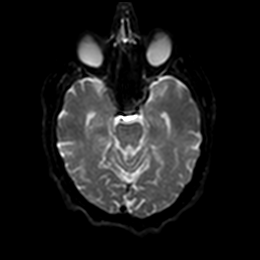
[im 24/36]
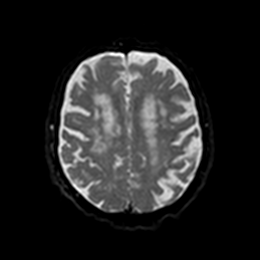
[im 36/36]
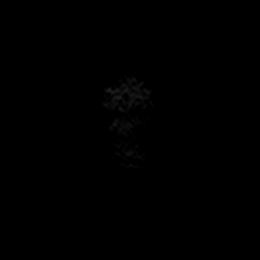

[Series 5: DWI · axial · 4.0mm · 0.88mm/px · z∈[-52,+86]mm · 4 of 36 slices shown (2 of 6)]
[im 1/36]
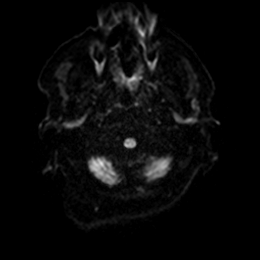
[im 12/36]
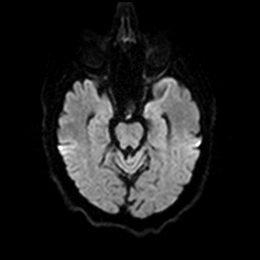
[im 24/36]
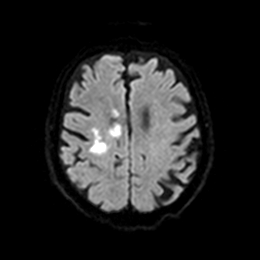
[im 36/36]
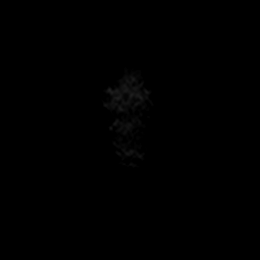

[Series 6: DWI · axial · 4.0mm · 0.88mm/px · z∈[-52,+86]mm · 5 of 36 slices shown (3 of 6)]
[im 1/36]
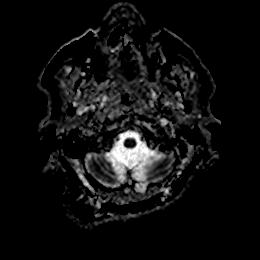
[im 9/36]
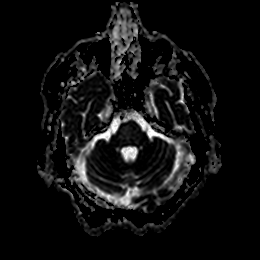
[im 18/36]
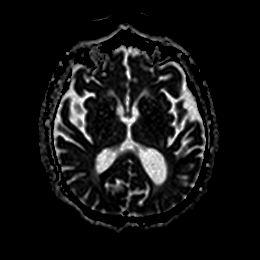
[im 27/36]
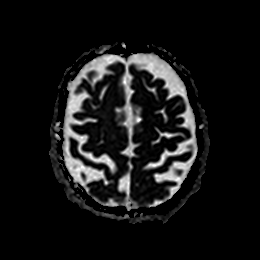
[im 36/36]
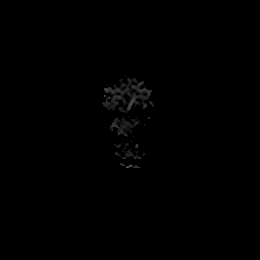

[Series 7: DWI · coronal · 5.0mm · 0.88mm/px · 4 of 28 slices shown (4 of 6)]
[im 1/28]
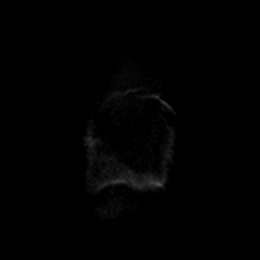
[im 10/28]
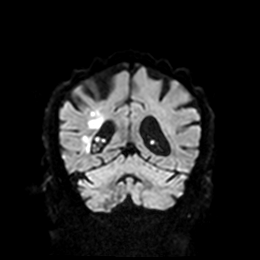
[im 19/28]
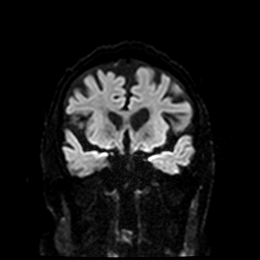
[im 28/28]
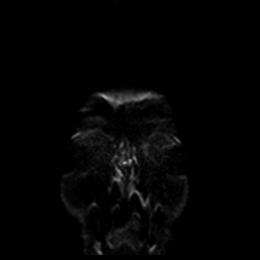

[Series 7: DWI · coronal · 5.0mm · 0.88mm/px · 4 of 28 slices shown (5 of 6)]
[im 1/28]
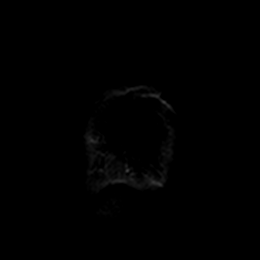
[im 10/28]
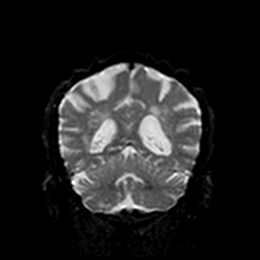
[im 19/28]
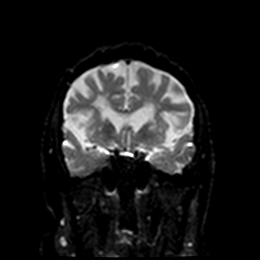
[im 28/28]
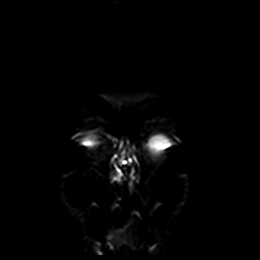

[Series 8: DWI · coronal · 5.0mm · 0.88mm/px · 4 of 28 slices shown (6 of 6)]
[im 1/28]
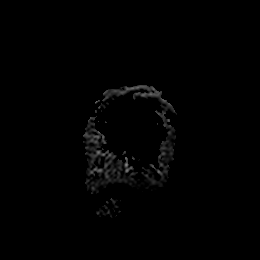
[im 10/28]
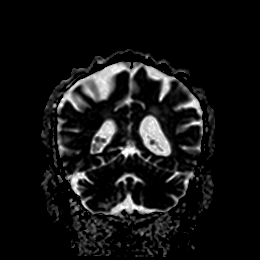
[im 19/28]
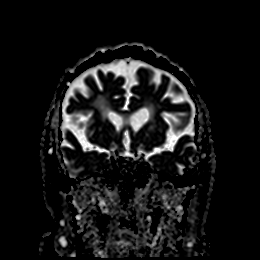
[im 28/28]
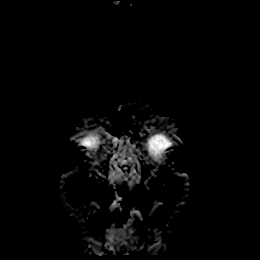

[Series 9: T1 · sagittal · 5.0mm · 0.94mm/px · 3 of 25 slices shown]
[im 1/25]
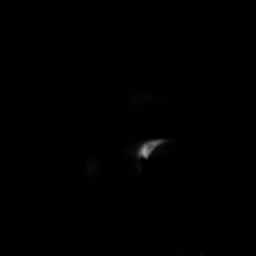
[im 13/25]
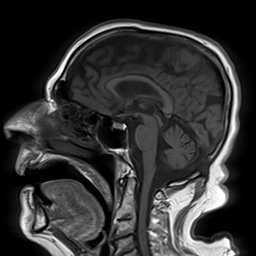
[im 25/25]
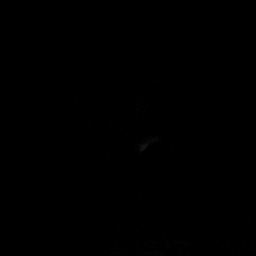

[Series 10: T2 · axial · 5.0mm · 0.72mm/px · z∈[-49,+83]mm · 2 of 17 slices shown (1 of 2)]
[im 1/17]
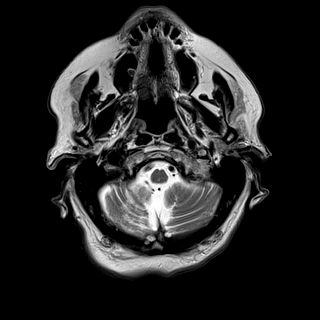
[im 17/17]
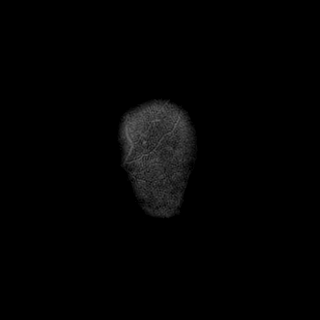

[Series 11: ax hemo · axial · 5.0mm · 0.86mm/px · z∈[-54,+89]mm · 3 of 25 slices shown]
[im 1/25]
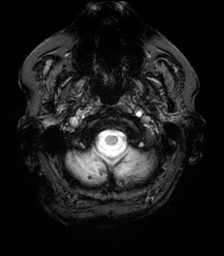
[im 13/25]
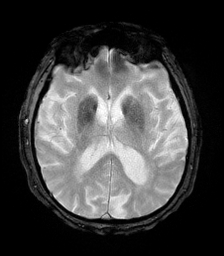
[im 25/25]
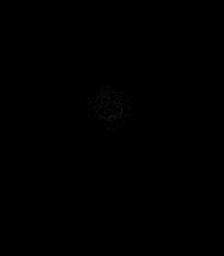

[Series 12: FLAIR · axial · 4.0mm · 0.43mm/px · z∈[-44,+79]mm · 4 of 32 slices shown]
[im 1/32]
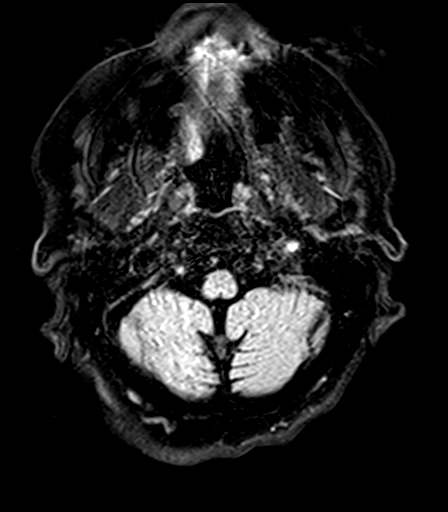
[im 11/32]
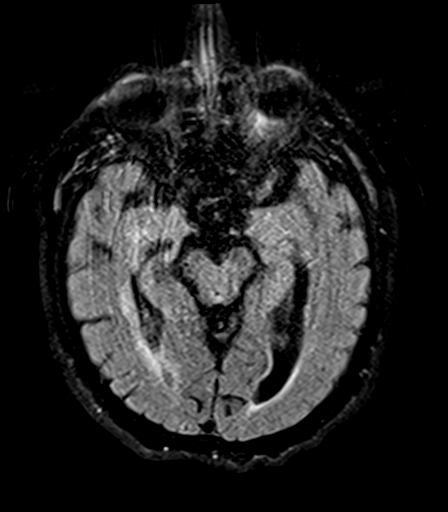
[im 21/32]
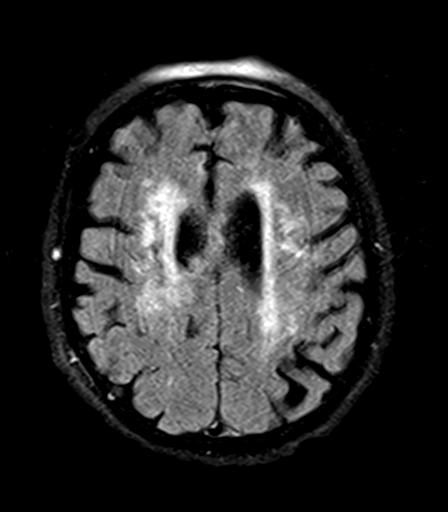
[im 32/32]
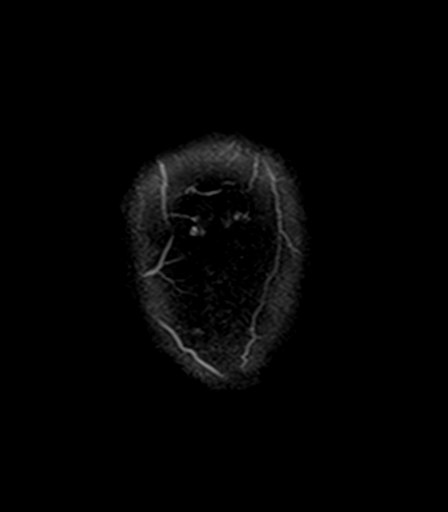

[Series 14: T2 · coronal · 5.0mm · 0.72mm/px · 3 of 25 slices shown (2 of 2)]
[im 1/25]
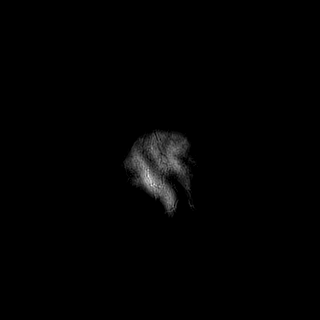
[im 13/25]
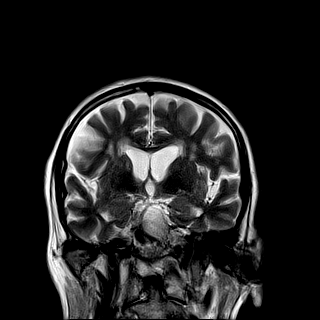
[im 25/25]
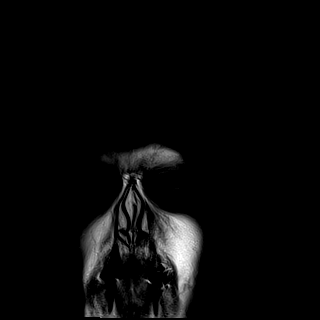

[40 of 48 positions shown; findings below may reference images not displayed]

FINDINGS: Brain: Scattered foci of restricted diffusion in the right cerebral
hemisphere along the ACA-MCA and MCA-PCA watershed zone. Small focus
of restricted diffusion in the deep white matter of the left
inferior parietal lobe. No hemorrhage, hydrocephalus, mass lesion or
extra-axial collection.

Remote lacunar infarcts in the left thalamus, bilateral basal
ganglia and posterior right medulla. Area of encephalomalacia in the
right cerebellar hemisphere. Scattered and confluent foci of T2
hyperintensity are seen within the white matter of cerebral
hemispheres and within the pons, non specific, most likely related
to chronic small vessel ischemia. Moderate parenchymal volume loss.

Vascular: Normal flow voids.

Skull and upper cervical spine: Normal marrow signal.

Sinuses/Orbits: Negative.

Other: None.
IMPRESSION: 1. Acute/subacute right ACA-MCA and MCA-PCA watershed infarcts.
2. Small left parietal acute/subacute infarct.
3. Remote infarcts in the left thalamus, bilateral basal ganglia,
posterior right medulla and right cerebellar hemisphere.
4. Advanced chronic microvascular ischemic changes of the white
matter.

## 2021-03-11 NOTE — Progress Notes (Signed)
Location:  [facility] Room Number: 111-W Place of Service:  SNF (31)   CODE STATUS: DNR  No Known Allergies  Chief Complaint  Patient presents with   Acute Visit    Care plan meeting    HPI:  We have come together for his care plan meeting. Family present   BIMS 7/15 mood 5/30: anxious; some depression. He is nonambulatory. He requires extensive assist to dependent with his adls. He requires assistance with meals. He is incontinent of bladder and bowel. He has had 5 falls without injury. Dietary:  weight is 161.7 pounds admission weight 170 pounds mechanical soft thin liquids. Appetite is variable; needs encouragement; supplements also.  Therapy dependent for transfers; adl ; max assist; left side neglect is improving.   ST:now on thin liquids . He will continue to be followed for his chronic illnesses including:  Aortic atherosclerosis Acute stroke due to ischemia Hypertension associated with type 2 diabetes mellitus  Mild protein calorie malnutrition  Past Medical History:  Diagnosis Date   Hypertension    Benign Essential   Hypothyroidism    Obesity    Osteoarthritis    PVD (peripheral vascular disease) ([place])    Type 2 diabetes mellitus ([place])     Past Surgical History:  Procedure Laterality Date   APPENDECTOMY     TONSILLECTOMY      Social History   Socioeconomic History   Marital status: Single    Spouse name: Not on file   Number of children: Not on file   Years of education: 12   Highest education level: Not on file  Occupational History   Occupation: RETIRED  Tobacco Use   Smoking status: Former   Smokeless tobacco: Never  Scientific laboratory technician Use: Never used  Substance and Sexual Activity   Alcohol use: Not Currently   Drug use: Never   Sexual activity: Not Currently  Other Topics Concern   Not on file  Social History Narrative   Not on file   Social Determinants of Health   Financial Resource Strain: Not on file  Food  Insecurity: Not on file  Transportation Needs: Not on file  Physical Activity: Not on file  Stress: Not on file  Social Connections: Not on file  Intimate Partner Violence: Not on file   Family History  Problem Relation Age of Onset   Osteoporosis Mother    Hypertension Mother    Cancer Mother    Heart attack Father    Heart attack Maternal Grandmother    Pneumonia Maternal Grandfather    Heart disease Paternal Grandmother    Heart disease Paternal Grandfather       VITAL SIGNS BP 136/80   Pulse 100   Temp 97.6 F (36.4 C)   Resp 20   Ht 5' 11"  (1.803 m)   Wt 161 lb 9.6 oz (73.3 kg)   SpO2 96%   BMI 22.54 kg/m   Outpatient Encounter Medications as of 03/11/2021  Medication Sig   aspirin EC 81 MG tablet Take 1 tablet (81 mg total) by mouth daily. Swallow whole.   atorvastatin (LIPITOR) 80 MG tablet Take 1 tablet (80 mg total) by mouth every evening.   clopidogrel (PLAVIX) 75 MG tablet Take 75 mg by mouth daily.   feeding supplement, GLUCERNA SHAKE, (GLUCERNA SHAKE) LIQD Take 237 mLs by mouth in the morning and at bedtime. 9 am and 9 pm   levothyroxine (SYNTHROID) 75 MCG tablet Take 75 mcg by  mouth daily before breakfast.   metFORMIN (GLUCOPHAGE) 1000 MG tablet Take 1,000 mg by mouth 2 (two) times daily with a meal.   miconazole (ZEASORB-AF) 2 % powder Apply 1 application topically as needed for itching (to groin and scrotum area substituted with shower to).   NON FORMULARY Diet Change Dysphagia 3 diet, thin liquids (NAS, ConCho)   omeprazole (PRILOSEC OTC) 20 MG tablet Take 20 mg by mouth at bedtime.   ondansetron (ZOFRAN-ODT) 8 MG disintegrating tablet Take 8 mg by mouth every 6 (six) hours as needed for nausea.   senna-docusate (SENOKOT-S) 8.6-50 MG tablet Take 2 tablets by mouth 2 (two) times daily. hold for diarrhea   sitaGLIPtin (JANUVIA) 50 MG tablet Take 50 mg by mouth daily.   triamcinolone cream (KENALOG) 0.1 % Apply 1 application topically 2 (two) times daily.  Apply to all areas of psoriasis   No facility-administered encounter medications on file as of 03/11/2021.     SIGNIFICANT DIAGNOSTIC EXAMS  PREVIOUS  01-18-21: ct of head:  Brain: Mild chronic ischemic white matter disease is noted. Mild diffuse cortical atrophy is noted. No mass effect or midline shift is noted. Ventricular size is within normal limits. There is no evidence of mass lesion, hemorrhage or acute infarction. Vascular: No hyperdense vessel or unexpected calcification. Skull: Normal. Negative for fracture or focal lesion. Sinuses/Orbits: No acute finding.  01-18-21: MRI/MRA of brain MRI: 1. Acute infarct in the posterolateral right medulla, likely right PICA territory. Additional acute right PCA territory infarcts in the right occipital lobe and posterior right corpus callosum. Associated mild edema without mass effect. 2. Developing encephalomalacia in the areas of previously seen infarct in the inferior right cerebellum. 3. Remote lacunar infarcts in the left thalamus, right caudate, and bilateral corona radiata. 4. Moderate chronic microvascular disease. MRA: 1. Severe stenosis of bilateral P2 PCAs with poor flow related signal in the more distal PCAs. Also, severe stenosis of the distal left M1 MCA, bilateral M2 MCA branches and bilateral A2 ACAs. 2. Similar multifocal moderate narrowing of the distal right intradural vertebral artery. Similar flow related signal in the proximal right PICA, but not the more distal right PICA.  01-19-21: 2-d echo:  1. Left ventricular ejection fraction, by estimation, is 70 to 75%. The  left ventricle has hyperdynamic function. The left ventricle has no  regional wall motion abnormalities. There is mild left ventricular  hypertrophy. Left ventricular diastolic  parameters are indeterminate.   01-28-21: chest x-ray:  1.  Prior CABG.  Heart size normal. 2. Mild left base subsegmental atelectasis. Tiny left pleural effusion cannot be excluded.   3. Right posterior eighth rib fracture. This may be acute. No pneumothorax.  01-28-21: KUB 1. Nonobstructive bowel gas pattern. 2. Prominent colonic stool volume. Imaging appearance raises the question of chronic constipation.  01-28-21: EKG Sinus rhythm with first degree AV block; PAC: R BBB  NO NEW EXAMS.   LABS REVIEWED: PREVIOUS   01-18-21: wbc 13.5; hgb 13.2; hct 40.5; mcv 96.4 plt 377; glucose 260; bun 22; creat 1.19; k+ 4.9; na++ 137; ca 9.6 GFR>60; alk phos 161;  albumin 3.6; urine culture: staphylococcus aureus 01-20-21: wbc 11.6; hgb 12.8; hct 39.0; mcv 96.5 plt 333; glucose 207; bun 26; creat 1.22; k+ 5.1; na++ 139; ca 9.2 GFR>60; hgb a1c 8.3; chol 217; LDL 150; trig 151; hdl 37 01-21-21: wbc 13.5; hgb 13.6; hct 41.6; mcv 95.9 plt 390; glucose 243; bun 31;creat 1.28; k+ 5.1; na++ 137; ca 9.5 GFR 59; alk phos  134; albumin 3.2 mag 1.8   01-27-21: wbc 20.8; hgb 14.6; hct 43.3; mcv 92.7 pt 468; glucose 414; bun 41; creat 1.58; k+ 5.6; na++ 131; ca 9.5; GFR 45; alk phos 142; albumin 3.4 troponin #1: 10; #2: 13 01-28-21: wbc 17.3; hgb 13.5; hct 40.4; mcv 94.0 plt 437; glucose 292; bun 42; creat 1.39; k+ 4.9; na++ 130; ca 9.2 GFR 52; troponin #3 12. Mag 1.8   NO NEW LABS.   Review of Systems  Constitutional:  Negative for malaise/fatigue.  Respiratory:  Negative for cough and shortness of breath.   Cardiovascular:  Negative for chest pain, palpitations and leg swelling.  Gastrointestinal:  Negative for abdominal pain, constipation and heartburn.  Musculoskeletal:  Negative for back pain, joint pain and myalgias.  Skin: Negative.   Neurological:  Negative for dizziness.  Psychiatric/Behavioral:  The patient is not nervous/anxious.     Physical Exam Constitutional:      General: He is not in acute distress.    Appearance: He is well-developed. He is not diaphoretic.  Neck:     Thyroid: No thyromegaly.  Cardiovascular:     Rate and Rhythm: Normal rate and regular rhythm.     Pulses:  Normal pulses.     Heart sounds: Normal heart sounds.  Pulmonary:     Effort: Pulmonary effort is normal. No respiratory distress.     Breath sounds: Normal breath sounds.  Abdominal:     General: Bowel sounds are normal. There is no distension.     Palpations: Abdomen is soft.     Tenderness: There is no abdominal tenderness.  Musculoskeletal:     Cervical back: Neck supple.     Right lower leg: No edema.     Left lower leg: No edema.     Comments:  Has increased weakness on left upper and lower extremity  Lymphadenopathy:     Cervical: No cervical adenopathy.  Skin:    General: Skin is warm and dry.  Neurological:     Mental Status: He is alert. Mental status is at baseline.  Psychiatric:        Mood and Affect: Mood normal.     ASSESSMENT/ PLAN:  TODAY  Aortic atherosclerosis Acute stroke due to ischemia Hypertension associated with type 2 diabetes mellitus  Mild protein calorie malnutrition  Will begin namenda titration.  Will continue current plan of care Will continue to monitor his status.  Goal of care: long term care   Time spent with patient: 40 minutes: care plan medications; therapy needs.    Ok Edwards NP [facility]  Contact [phone] Monday through Friday 8am- 5pm  After hours call [phone]

## 2021-03-12 ENCOUNTER — Inpatient Hospital Stay (HOSPITAL_COMMUNITY): Payer: Medicare Other

## 2021-03-12 ENCOUNTER — Inpatient Hospital Stay
Admission: RE | Admit: 2021-03-12 | Discharge: 2022-07-18 | Disposition: A | Discharge status: Skilled Nursing Facility | Payer: Medicare Other | Source: Ambulatory Visit | Attending: Internal Medicine | Admitting: Internal Medicine

## 2021-03-12 ENCOUNTER — Encounter (HOSPITAL_COMMUNITY): Payer: Self-pay

## 2021-03-12 ENCOUNTER — Emergency Department (HOSPITAL_COMMUNITY)
Admission: EM | Admit: 2021-03-12 | Discharge: 2021-03-12 | Disposition: A | Discharge status: Home / Self Care | Payer: Medicare Other | Attending: Emergency Medicine | Admitting: Emergency Medicine

## 2021-03-12 ENCOUNTER — Other Ambulatory Visit: Payer: Self-pay

## 2021-03-12 DIAGNOSIS — Z7982 Long term (current) use of aspirin: Secondary | ICD-10-CM | POA: Insufficient documentation

## 2021-03-12 DIAGNOSIS — E039 Hypothyroidism, unspecified: Secondary | ICD-10-CM | POA: Diagnosis not present

## 2021-03-12 DIAGNOSIS — R9389 Abnormal findings on diagnostic imaging of other specified body structures: Secondary | ICD-10-CM | POA: Diagnosis not present

## 2021-03-12 DIAGNOSIS — Z79899 Other long term (current) drug therapy: Secondary | ICD-10-CM | POA: Diagnosis not present

## 2021-03-12 DIAGNOSIS — I10 Essential (primary) hypertension: Secondary | ICD-10-CM | POA: Insufficient documentation

## 2021-03-12 DIAGNOSIS — R531 Weakness: Secondary | ICD-10-CM | POA: Insufficient documentation

## 2021-03-12 DIAGNOSIS — E1169 Type 2 diabetes mellitus with other specified complication: Secondary | ICD-10-CM | POA: Insufficient documentation

## 2021-03-12 DIAGNOSIS — R5383 Other fatigue: Secondary | ICD-10-CM | POA: Insufficient documentation

## 2021-03-12 DIAGNOSIS — I639 Cerebral infarction, unspecified: Secondary | ICD-10-CM | POA: Insufficient documentation

## 2021-03-12 DIAGNOSIS — I739 Peripheral vascular disease, unspecified: Secondary | ICD-10-CM | POA: Diagnosis not present

## 2021-03-12 DIAGNOSIS — I69952 Hemiplegia and hemiparesis following unspecified cerebrovascular disease affecting left dominant side: Secondary | ICD-10-CM | POA: Diagnosis not present

## 2021-03-12 DIAGNOSIS — Z87891 Personal history of nicotine dependence: Secondary | ICD-10-CM | POA: Insufficient documentation

## 2021-03-12 DIAGNOSIS — Z7902 Long term (current) use of antithrombotics/antiplatelets: Secondary | ICD-10-CM | POA: Insufficient documentation

## 2021-03-12 DIAGNOSIS — Z7984 Long term (current) use of oral hypoglycemic drugs: Secondary | ICD-10-CM | POA: Insufficient documentation

## 2021-03-12 DIAGNOSIS — I6602 Occlusion and stenosis of left middle cerebral artery: Secondary | ICD-10-CM | POA: Diagnosis not present

## 2021-03-12 DIAGNOSIS — I6611 Occlusion and stenosis of right anterior cerebral artery: Secondary | ICD-10-CM | POA: Diagnosis not present

## 2021-03-12 DIAGNOSIS — I6523 Occlusion and stenosis of bilateral carotid arteries: Secondary | ICD-10-CM | POA: Diagnosis not present

## 2021-03-12 LAB — COMPREHENSIVE METABOLIC PANEL
ALT: 14 U/L (ref 0–44)
AST: 16 U/L (ref 15–41)
Albumin: 3.3 g/dL — ABNORMAL LOW (ref 3.5–5.0)
Alkaline Phosphatase: 97 U/L (ref 38–126)
Anion gap: 10 (ref 5–15)
BUN: 23 mg/dL (ref 8–23)
CO2: 23 mmol/L (ref 22–32)
Calcium: 9 mg/dL (ref 8.9–10.3)
Chloride: 107 mmol/L (ref 98–111)
Creatinine, Ser: 1.25 mg/dL — ABNORMAL HIGH (ref 0.61–1.24)
GFR, Estimated: 59 mL/min — ABNORMAL LOW (ref >60)
Glucose, Bld: 147 mg/dL — ABNORMAL HIGH (ref 70–99)
Potassium: 4.5 mmol/L (ref 3.5–5.1)
Sodium: 140 mmol/L (ref 135–145)
Total Bilirubin: 0.5 mg/dL (ref 0.3–1.2)
Total Protein: 6.6 g/dL (ref 6.5–8.1)

## 2021-03-12 LAB — CBC WITH DIFFERENTIAL/PLATELET
Abs Immature Granulocytes: 0.03 10*3/uL (ref 0.00–0.07)
Basophils Absolute: 0.1 10*3/uL (ref 0.0–0.1)
Basophils Relative: 1 %
Eosinophils Absolute: 0.1 10*3/uL (ref 0.0–0.5)
Eosinophils Relative: 1 %
HCT: 39.8 % (ref 39.0–52.0)
Hemoglobin: 13.1 g/dL (ref 13.0–17.0)
Immature Granulocytes: 0 %
Lymphocytes Relative: 38 %
Lymphs Abs: 3.8 10*3/uL (ref 0.7–4.0)
MCH: 31.4 pg (ref 26.0–34.0)
MCHC: 32.9 g/dL (ref 30.0–36.0)
MCV: 95.4 fL (ref 80.0–100.0)
Monocytes Absolute: 0.7 10*3/uL (ref 0.1–1.0)
Monocytes Relative: 8 %
Neutro Abs: 5.2 10*3/uL (ref 1.7–7.7)
Neutrophils Relative %: 52 %
Platelets: 296 10*3/uL (ref 150–400)
RBC: 4.17 MIL/uL — ABNORMAL LOW (ref 4.22–5.81)
RDW: 14.4 % (ref 11.5–15.5)
WBC: 9.9 10*3/uL (ref 4.0–10.5)
nRBC: 0 % (ref 0.0–0.2)

## 2021-03-12 IMAGING — MR MR MRA HEAD W/O CM
1 series · 48 of 48 positions shown · non-contrast
Comparison: MR head without contrast [DATE]. MR head and MRA
head [DATE].

CLINICAL DATA: Right-sided watershed infarcts.

EXAM:
MRA HEAD WITHOUT CONTRAST
TECHNIQUE: Angiographic images of the Circle of Willis were acquired using MRA
technique without intravenous contrast.

[Series 1: TOF · axial · 0.5mm · 0.76mm/px · z∈[-13,+65]mm · 48 of 168 slices shown]
[im 1/168]
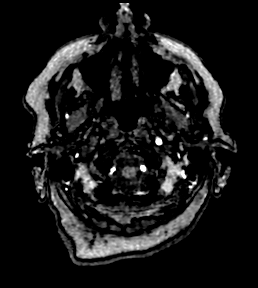
[im 4/168]
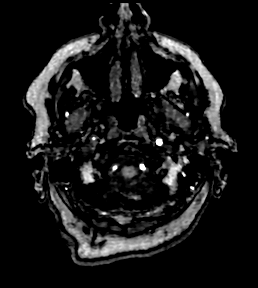
[im 8/168]
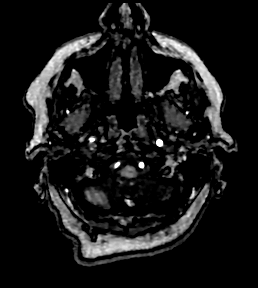
[im 11/168]
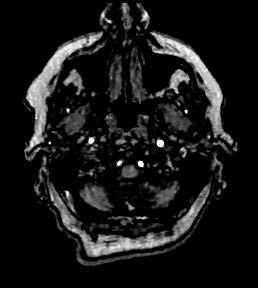
[im 15/168]
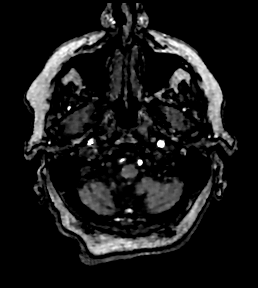
[im 18/168]
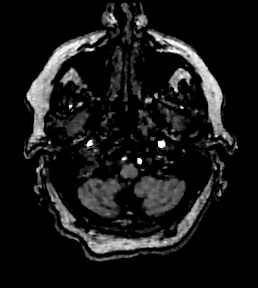
[im 22/168]
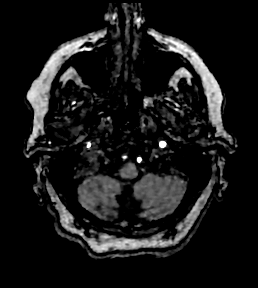
[im 25/168]
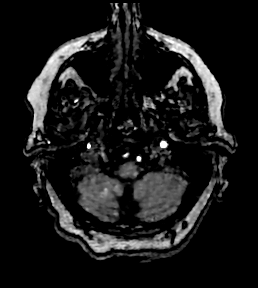
[im 29/168]
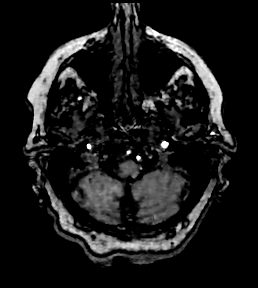
[im 32/168]
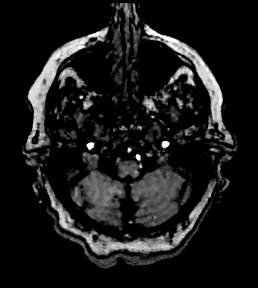
[im 36/168]
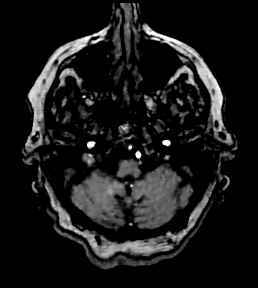
[im 40/168]
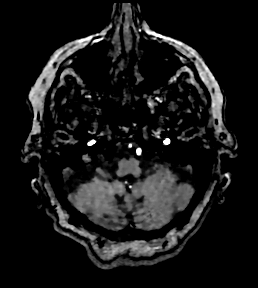
[im 43/168]
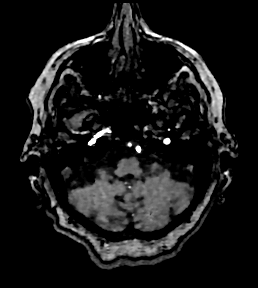
[im 47/168]
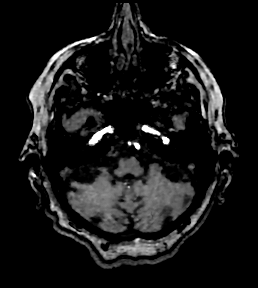
[im 50/168]
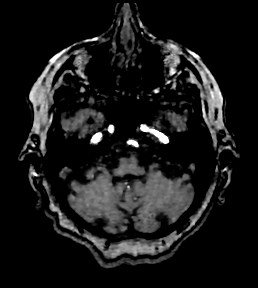
[im 54/168]
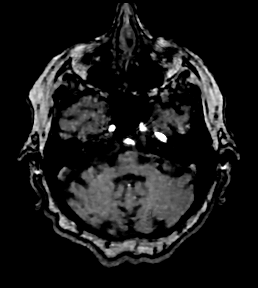
[im 57/168]
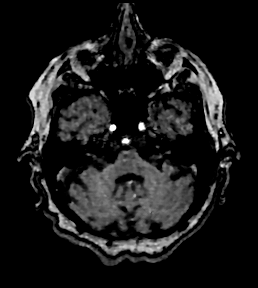
[im 61/168]
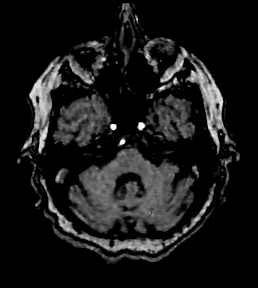
[im 64/168]
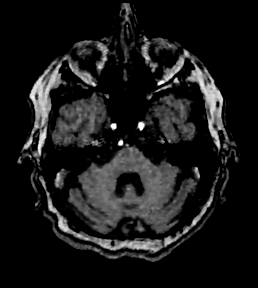
[im 68/168]
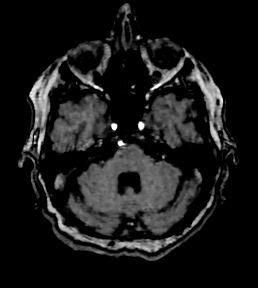
[im 72/168]
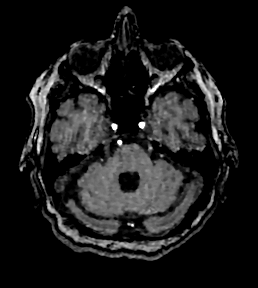
[im 75/168]
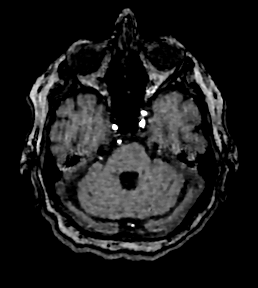
[im 79/168]
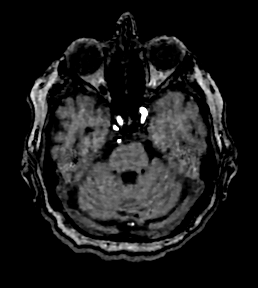
[im 82/168]
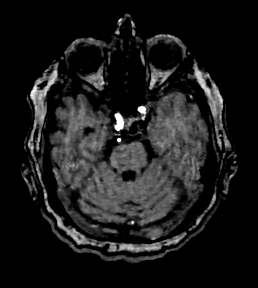
[im 86/168]
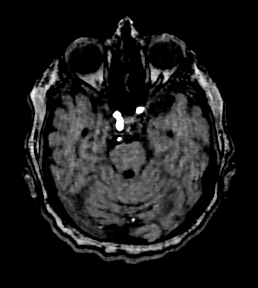
[im 89/168]
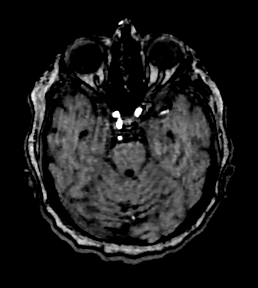
[im 93/168]
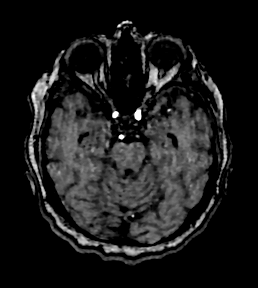
[im 96/168]
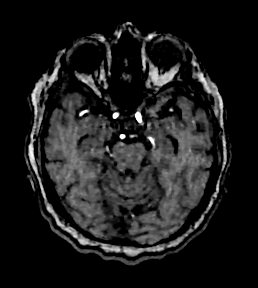
[im 100/168]
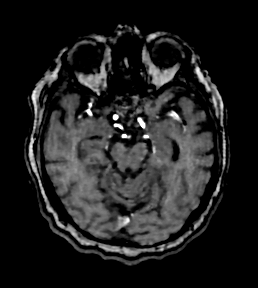
[im 104/168]
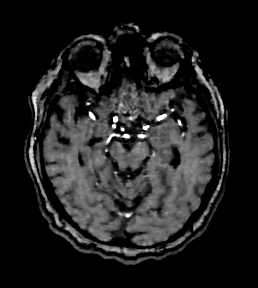
[im 107/168]
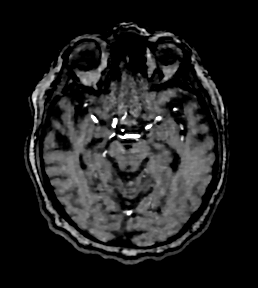
[im 111/168]
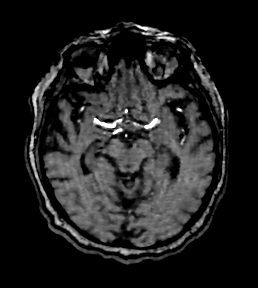
[im 114/168]
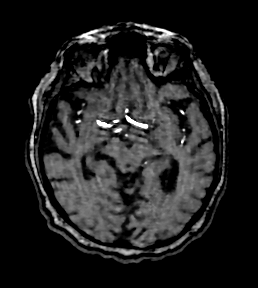
[im 118/168]
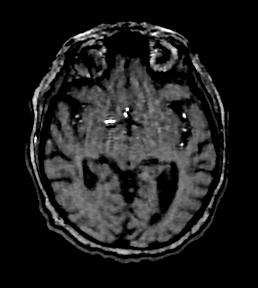
[im 121/168]
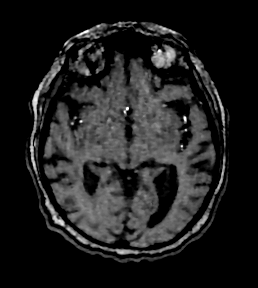
[im 125/168]
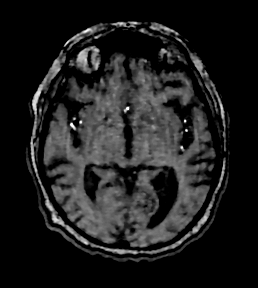
[im 128/168]
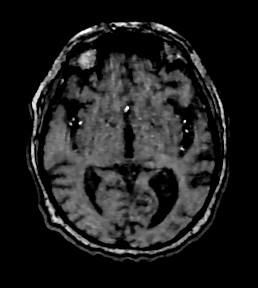
[im 132/168]
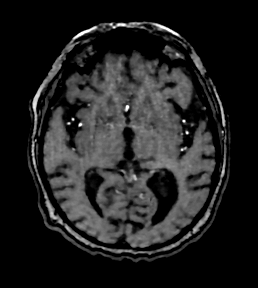
[im 136/168]
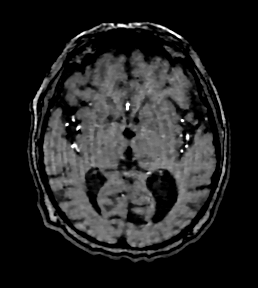
[im 139/168]
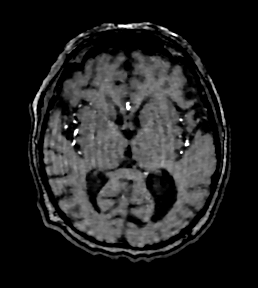
[im 143/168]
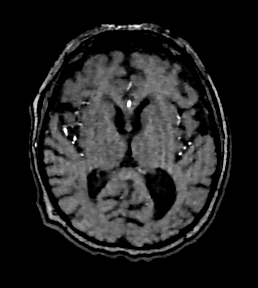
[im 146/168]
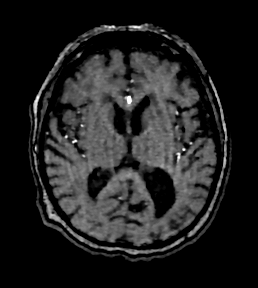
[im 150/168]
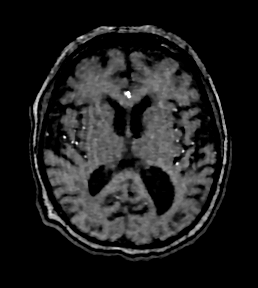
[im 153/168]
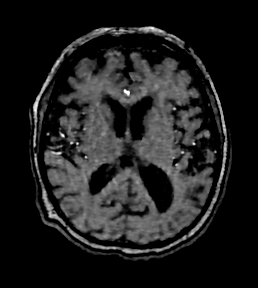
[im 157/168]
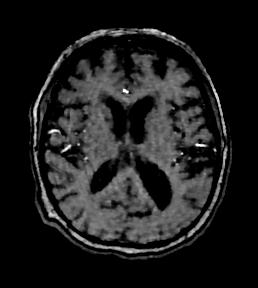
[im 160/168]
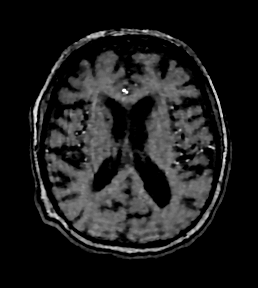
[im 164/168]
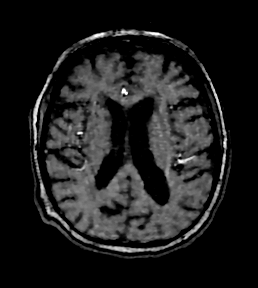
[im 168/168]
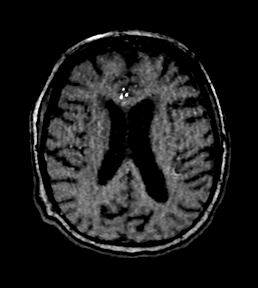

[48 of 48 positions shown; findings below may reference images not displayed]

FINDINGS: Anterior circulation: Atherosclerotic changes are again noted within
the cavernous internal carotid arteries bilaterally without
significant stenosis. Mild narrowing of less than 50% in the left
ICA terminus stable. Left A1 segment is dominant. No definite
anterior communicating artery is patent

High-grade stenosis again noted in the distal left M1 segment just
before the bifurcation. Moderate 10 UA shin is present in the MCA
branch vessels bilaterally. High-grade stenosis is again noted in
the right A2 segment.

Posterior circulation: Left vertebral artery is the dominant vessel.
PICA origin is visualized and normal. Right posterior cerebral
artery is of fetal type basilar artery is normal. Left posterior
cerebral artery originates from basilar tip. There is significant
attenuation of distal PCA branch vessels bilaterally.

Anatomic variants: Fetal type right posterior cerebral artery.

Other: None.
IMPRESSION: 1. Stable atherosclerotic changes without significant stenosis.
2. Mild narrowing of the left ICA terminus.
3. High-grade stenosis of the distal left M1 segment just before the
bifurcation.
4. High-grade stenosis of the right A2 segment.
5. Moderate diffuse small vessel disease.

## 2021-03-12 IMAGING — MR MR MRA NECK WO/W CM
3 series · 42 of 48 positions shown · IV contrast (7 ml Gadavist)
Comparison: Carotid ultrasound [DATE]

CLINICAL DATA: Right-sided watershed territory infarcts.

EXAM:
MRA NECK WITHOUT AND WITH CONTRAST
TECHNIQUE: Multiplanar and multiecho pulse sequences of the neck were obtained
without and with intravenous contrast. Angiographic images of the
neck were obtained using MRA technique without and with intravenous
contrast.
CONTRAST:  7mL GADAVIST GADOBUTROL 1 MMOL/ML IV SOLN

[Series 11: tof_fl3d_tra_iso · axial · 0.6mm · 0.52mm/px · z∈[-122,-43]mm · 24 of 133 slices shown]
[im 1/133]
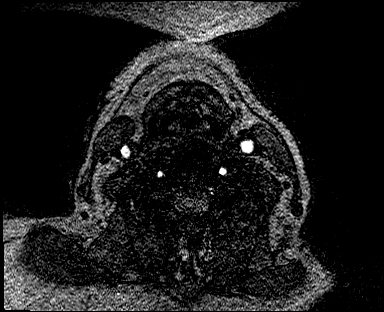
[im 6/133]
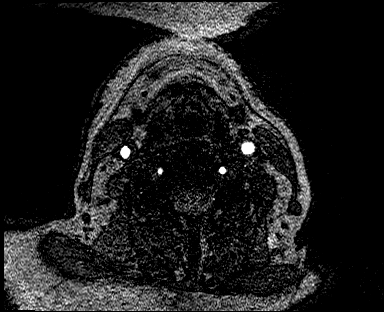
[im 12/133]
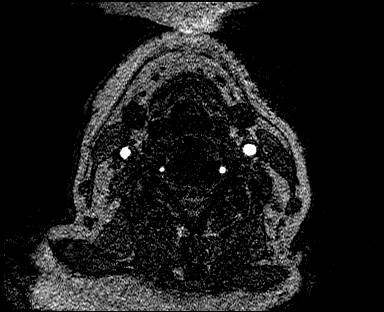
[im 18/133]
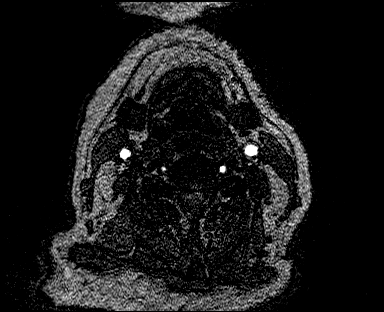
[im 23/133]
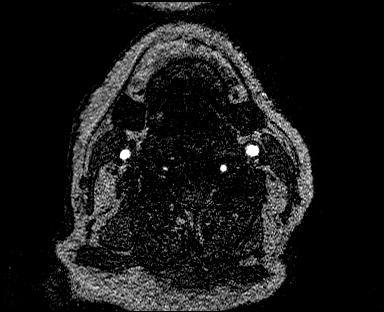
[im 29/133]
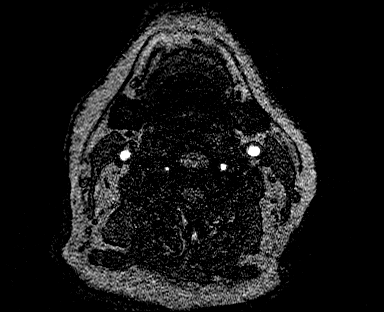
[im 35/133]
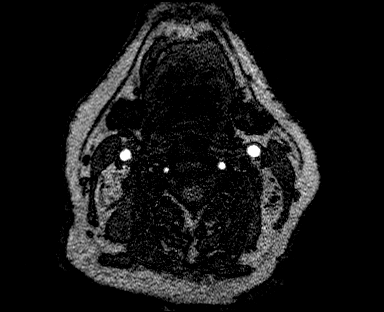
[im 41/133]
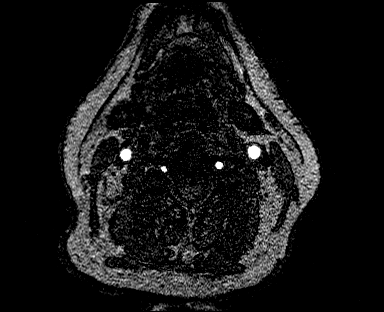
[im 46/133]
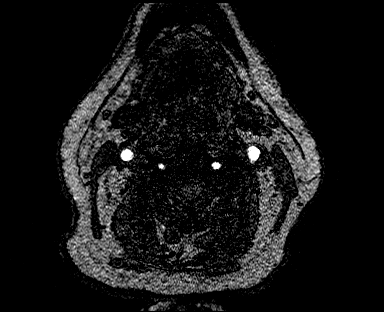
[im 52/133]
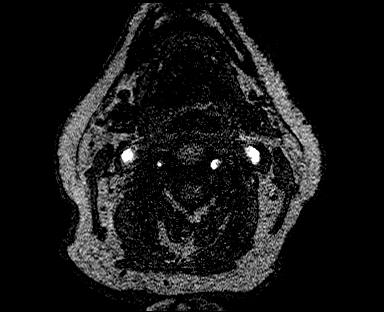
[im 58/133]
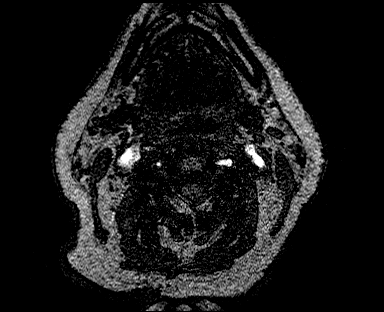
[im 64/133]
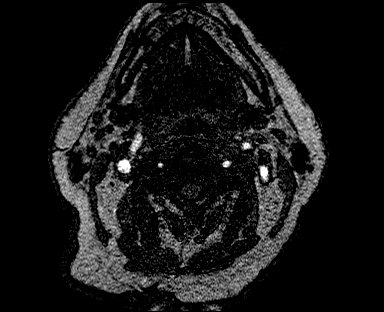
[im 69/133]
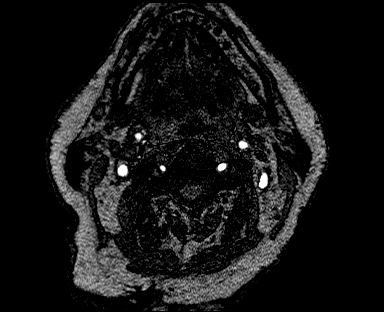
[im 75/133]
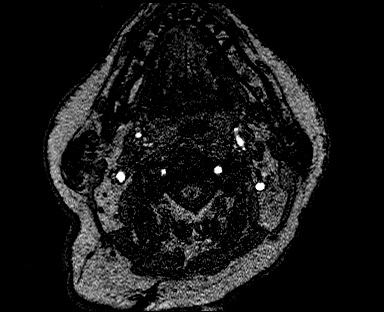
[im 81/133]
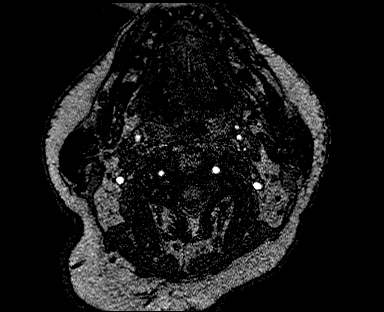
[im 87/133]
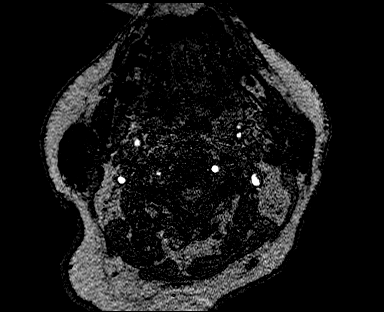
[im 92/133]
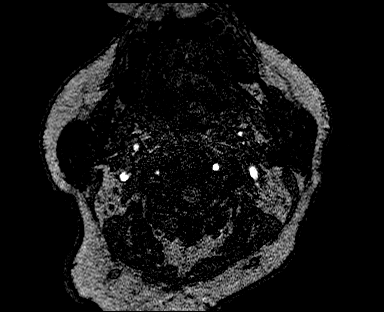
[im 98/133]
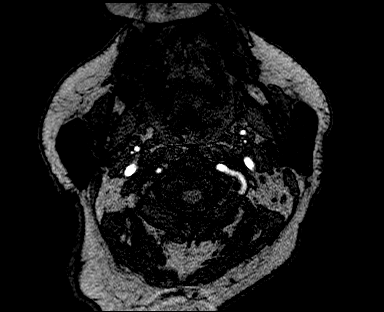
[im 104/133]
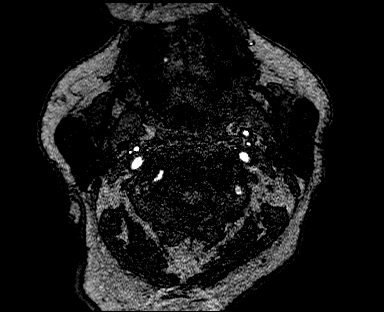
[im 110/133]
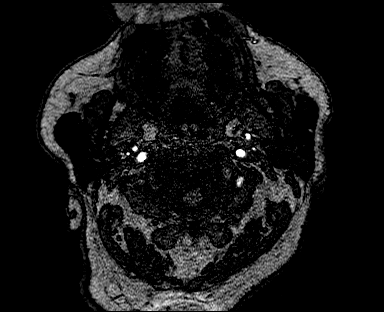
[im 115/133]
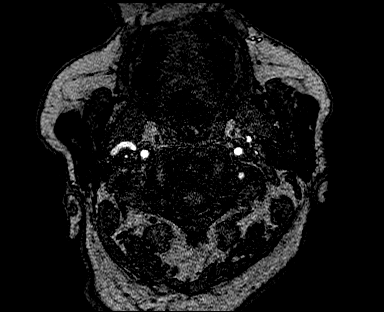
[im 121/133]
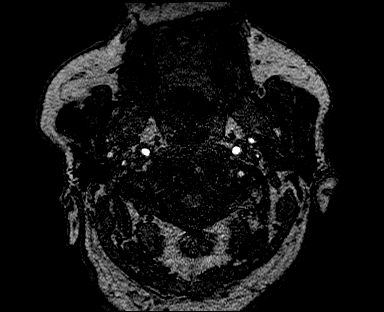
[im 127/133]
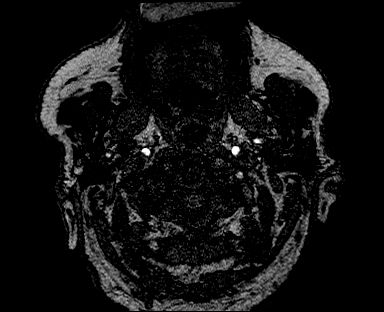
[im 133/133]
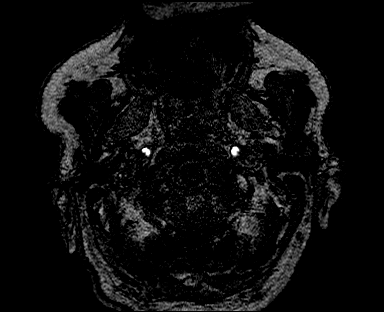

[Series 17: angio_fl3d_cor_post_ttc=3.0s_moco-adv · coronal · 0.9mm · 0.85mm/px · 9 of 72 slices shown]
[im 1/72]
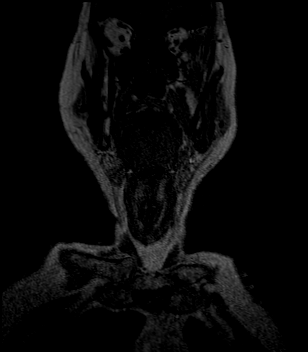
[im 13/72]
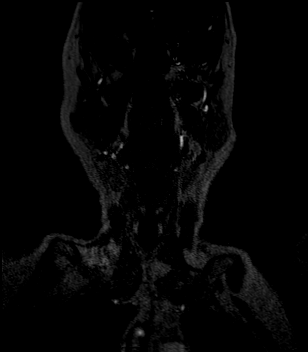
[im 20/72]
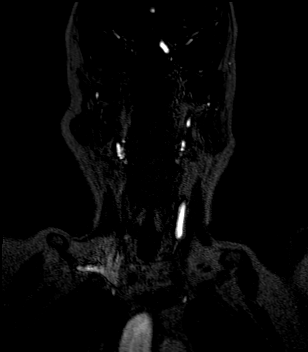
[im 33/72]
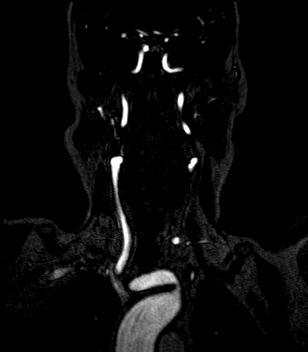
[im 39/72]
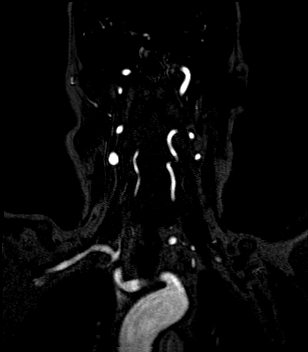
[im 52/72]
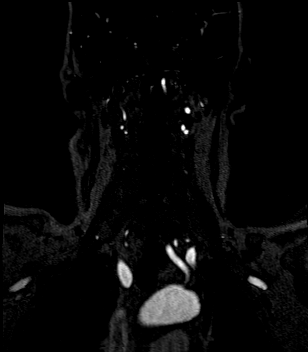
[im 59/72]
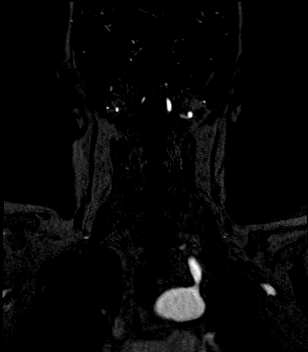
[im 65/72]
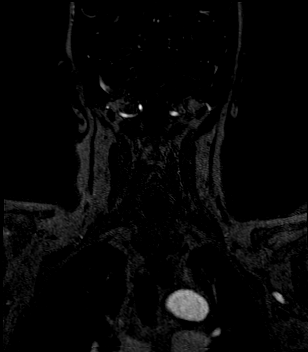
[im 72/72]
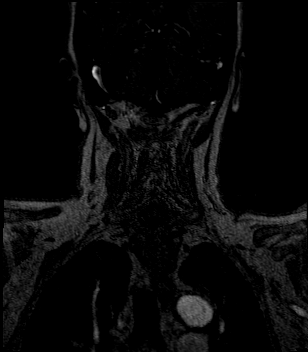

[Series 18: angio_fl3d_cor_post_ttc=3.0s_moco-adv_sub · coronal · 0.9mm · 0.85mm/px · 9 of 72 slices shown]
[im 1/72]
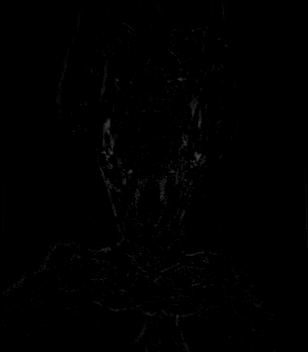
[im 13/72]
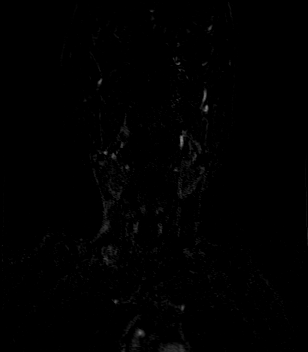
[im 20/72]
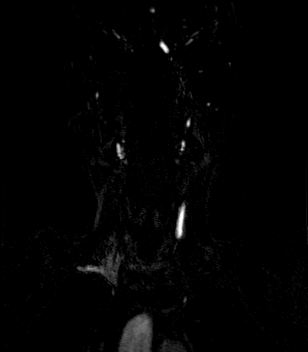
[im 33/72]
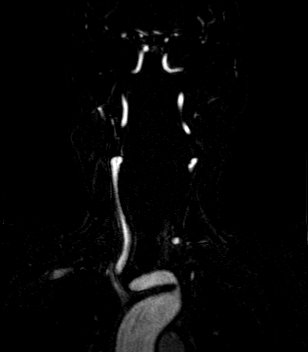
[im 39/72]
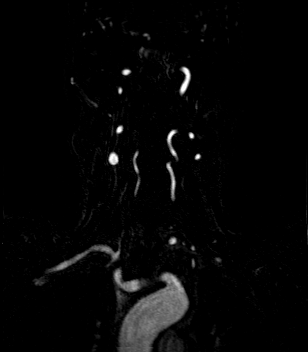
[im 52/72]
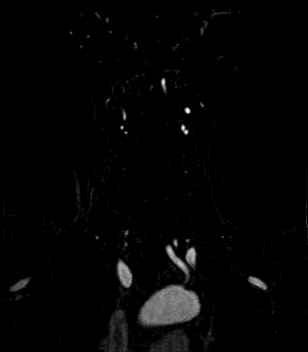
[im 59/72]
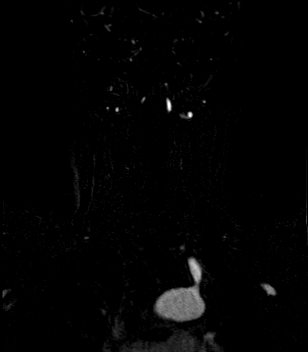
[im 65/72]
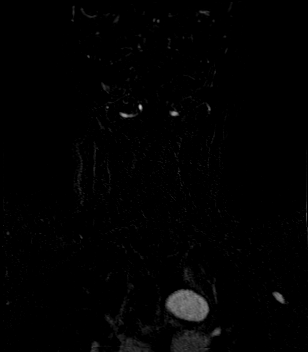
[im 72/72]
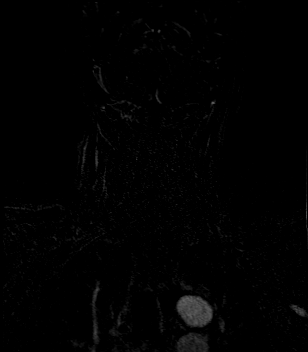

[42 of 48 positions shown; findings below may reference images not displayed]

FINDINGS: Time-of-flight images demonstrate no significant flow disturbance at
either carotid bifurcation.

Postcontrast images demonstrated a 3 vessel arch configuration.
Right bifurcation demonstrates mild irregularity. Moderate distal
stenosis is present just below the skull base lumen is narrowed to
1.5 mm. This compares with a distal measurement of 4 mm.

The left common carotid artery is within normal limits. There is
some atherosclerotic irregularity of the proximal ICA without
significant stenosis. Moderate to high-grade stenosis is present
proximal cavernous left ICA.

Left vertebral artery is dominant. No significant stenoses are
present in the neck. Both vertebral arteries originate from the
subclavian arteries.
IMPRESSION: 1. Moderate distal right ICA stenosis just below the skull base.
2. Moderate to high-grade stenosis of the proximal cavernous left
ICA.
3. Mild atherosclerotic disease as previously reported in the
carotid bifurcations bilaterally without significant bifurcation
stenosis.
4. Left vertebral artery is dominant. No significant vertebral
artery stenoses are present in the neck.

## 2021-03-12 MED ORDER — SODIUM CHLORIDE 0.9 % IV BOLUS
1000.0000 mL | Freq: Once | INTRAVENOUS | Status: AC
  Administered 2021-03-12: 1000 mL via INTRAVENOUS

## 2021-03-12 MED ORDER — GADOBUTROL 1 MMOL/ML IV SOLN
7.0000 mL | Freq: Once | INTRAVENOUS | Status: AC | PRN
  Administered 2021-03-12: 7 mL via INTRAVENOUS

## 2021-03-12 MED ORDER — SODIUM CHLORIDE 0.9 % IV BOLUS: 500.0000 mL | Freq: Once | INTRAVENOUS | Status: DC

## 2021-03-12 NOTE — ED Provider Notes (Signed)
[facility] EMERGENCY DEPARTMENT Provider Note   CSN: [id number] Arrival date & time: 03/12/21  1110     History Chief Complaint  Patient presents with   Abnormal MRI    Jason Cox is a 75 y.o. male.  Patient had a stroke last month and was followed up by his neurologist recently.  He got an MRI of his head yesterday which shows no acute stroke.  The history is provided by the patient and medical records. No language interpreter was used.  Weakness Severity:  Mild Onset quality:  Gradual Timing:  Intermittent Progression:  Waxing and waning Chronicity:  Recurrent Context: not alcohol use   Relieved by:  Nothing Worsened by:  Nothing Ineffective treatments:  None tried Associated symptoms: no abdominal pain, no chest pain, no cough, no diarrhea, no frequency, no headaches and no seizures       Past Medical History:  Diagnosis Date   Hypertension    Benign Essential   Hypothyroidism    Obesity    Osteoarthritis    PVD (peripheral vascular disease) (HCC)    Type 2 diabetes mellitus (HCC)     Patient Active Problem List   Diagnosis Date Noted   Aortic atherosclerosis (HCC) 01/28/2021   Coronary atherosclerosis 01/28/2021   Chronic constipation 01/28/2021   Acute renal failure (ARF) (HCC) 01/28/2021   Right rib fracture 01/28/2021   Nausea and vomiting 01/27/2021   Hyperlipidemia associated with type 2 diabetes mellitus (HCC) 01/25/2021   Hypertension associated with type 2 diabetes mellitus (HCC) 01/25/2021   GERD without esophagitis 01/25/2021   Physical deconditioning 01/25/2021   Hyperlipidemia    Acute stroke due to ischemia (HCC) 11/17/2020   Essential hypertension 11/17/2020   Type 2 diabetes mellitus with neurological complications (HCC) 11/17/2020   Acute lower UTI 11/17/2020   Mild protein-calorie malnutrition (HCC) 11/17/2020   Thyroid disease 11/17/2020    Past Surgical History:  Procedure Laterality Date   APPENDECTOMY     TONSILLECTOMY          Family History  Problem Relation Age of Onset   Osteoporosis Mother    Hypertension Mother    Cancer Mother    Heart attack Father    Heart attack Maternal Grandmother    Pneumonia Maternal Grandfather    Heart disease Paternal Grandmother    Heart disease Paternal Grandfather     Social History   Tobacco Use   Smoking status: Former   Smokeless tobacco: Never  Building services engineer Use: Never used  Substance Use Topics   Alcohol use: Not Currently   Drug use: Never    Home Medications Prior to Admission medications   Medication Sig Start Date End Date Taking? Authorizing Provider  aspirin EC 81 MG tablet Take 1 tablet (81 mg total) by mouth daily. Swallow whole. 11/18/20 11/18/21 Yes Vassie Loll, MD  atorvastatin (LIPITOR) 80 MG tablet Take 1 tablet (80 mg total) by mouth every evening. 01/22/21  Yes Tat, Onalee Hua, MD  clopidogrel (PLAVIX) 75 MG tablet Take 75 mg by mouth daily.   Yes [provider]  levothyroxine (SYNTHROID) 75 MCG tablet Take 75 mcg by mouth daily before breakfast. 11/22/19  Yes [provider]  memantine (NAMENDA XR) 7 MG CP24 24 hr capsule Take 7 mg by mouth daily.   Yes [provider]  metFORMIN (GLUCOPHAGE) 1000 MG tablet Take 1,000 mg by mouth 2 (two) times daily with a meal.   Yes [provider]  omeprazole (PRILOSEC OTC)  20 MG tablet Take 20 mg by mouth at bedtime.   Yes [provider]  ondansetron (ZOFRAN-ODT) 8 MG disintegrating tablet Take 8 mg by mouth every 6 (six) hours as needed for nausea.   Yes [provider]  senna-docusate (SENOKOT-S) 8.6-50 MG tablet Take 2 tablets by mouth 2 (two) times daily. hold for diarrhea   Yes [provider]  sitaGLIPtin (JANUVIA) 50 MG tablet Take 50 mg by mouth daily.   Yes [provider]  triamcinolone cream (KENALOG) 0.1 % Apply 1 application topically 2 (two) times daily. Apply to all areas of psoriasis   Yes [provider]  feeding supplement, GLUCERNA SHAKE, (GLUCERNA SHAKE) LIQD Take 237 mLs by mouth in the morning and at bedtime. 9 am and 9 pm Patient not taking: Reported on 03/12/2021    [provider]  miconazole (ZEASORB-AF) 2 % powder Apply 1 application topically as needed for itching (to groin and scrotum area substituted with shower to). Patient not taking: Reported on 03/12/2021    [provider]    Allergies    Patient has no known allergies.  Review of Systems   Review of Systems  Constitutional:  Negative for appetite change and fatigue.  HENT:  Negative for congestion, ear discharge and sinus pressure.   Eyes:  Negative for discharge.  Respiratory:  Negative for cough.   Cardiovascular:  Negative for chest pain.  Gastrointestinal:  Negative for abdominal pain and diarrhea.  Genitourinary:  Negative for frequency and hematuria.  Musculoskeletal:  Negative for back pain.  Skin:  Negative for rash.  Neurological:  Positive for weakness. Negative for seizures and headaches.  Psychiatric/Behavioral:  Negative for hallucinations.    Physical Exam Updated Vital Signs BP 109/64   Pulse (!) 101   Temp 97.6 F (36.4 C) (Oral)   Resp 11   Ht 5\' 11"  (1.803 m)   Wt 73 kg   SpO2 99%   BMI 22.45 kg/m   Physical Exam Vitals and nursing note reviewed.  Constitutional:      Appearance: He is well-developed.     Comments: Mildly lethargic  HENT:     Head: Normocephalic.     Nose: Nose normal.  Eyes:     General: No scleral icterus.    Conjunctiva/sclera: Conjunctivae normal.  Neck:     Thyroid: No thyromegaly.  Cardiovascular:     Rate and Rhythm: Normal rate and regular rhythm.     Heart sounds: No murmur heard.   No friction rub. No gallop.  Pulmonary:     Breath sounds: No stridor. No wheezing or rales.  Chest:     Chest wall: No tenderness.  Abdominal:     General: There is no distension.     Tenderness: There is no abdominal tenderness. There  is no rebound.  Musculoskeletal:        General: Normal range of motion.     Cervical back: Neck supple.     Comments: Pt with general weakness in all extremities  Lymphadenopathy:     Cervical: No cervical adenopathy.  Skin:    Findings: No erythema or rash.  Neurological:     Motor: No abnormal muscle tone.     Coordination: Coordination normal.     Comments: Oriented to person only    ED Results / Procedures / Treatments   Labs (all labs ordered are listed, but only abnormal results are displayed) Labs Reviewed  CBC WITH DIFFERENTIAL/PLATELET - Abnormal; Notable for  the following components:      Result Value   RBC 4.17 (*)    All other components within normal limits  COMPREHENSIVE METABOLIC PANEL - Abnormal; Notable for the following components:   Glucose, Bld 147 (*)    Creatinine, Ser 1.25 (*)    Albumin 3.3 (*)    GFR, Estimated 59 (*)    All other components within normal limits    EKG None  Radiology MR ANGIO HEAD WO CONTRAST  Result Date: 03/12/2021 CLINICAL DATA:  Right-sided watershed infarcts. EXAM: MRA HEAD WITHOUT CONTRAST TECHNIQUE: Angiographic images of the Circle of Willis were acquired using MRA technique without intravenous contrast. COMPARISON:  MR head without contrast 03/11/2021. MR head and MRA head 11/17/2020. FINDINGS: Anterior circulation: Atherosclerotic changes are again noted within the cavernous internal carotid arteries bilaterally without significant stenosis. Mild narrowing of less than 50% in the left ICA terminus stable. Left A1 segment is dominant. No definite anterior communicating artery is patent High-grade stenosis again noted in the distal left M1 segment just before the bifurcation. Moderate 10 UA shin is present in the MCA branch vessels bilaterally. High-grade stenosis is again noted in the right A2 segment. Posterior circulation: Left vertebral artery is the dominant vessel. PICA origin is visualized and normal. Right posterior  cerebral artery is of fetal type basilar artery is normal. Left posterior cerebral artery originates from basilar tip. There is significant attenuation of distal PCA branch vessels bilaterally. Anatomic variants: Fetal type right posterior cerebral artery. Other: None. IMPRESSION: 1. Stable atherosclerotic changes without significant stenosis. 2. Mild narrowing of the left ICA terminus. 3. High-grade stenosis of the distal left M1 segment just before the bifurcation. 4. High-grade stenosis of the right A2 segment. 5. Moderate diffuse small vessel disease. Electronically Signed   By: Marin Roberts M.D.   On: 03/12/2021 14:10   MR Angiogram Neck W or Wo Contrast  Result Date: 03/12/2021 CLINICAL DATA:  Right-sided watershed territory infarcts. EXAM: MRA NECK WITHOUT AND WITH CONTRAST TECHNIQUE: Multiplanar and multiecho pulse sequences of the neck were obtained without and with intravenous contrast. Angiographic images of the neck were obtained using MRA technique without and with intravenous contrast. CONTRAST:  54mL GADAVIST GADOBUTROL 1 MMOL/ML IV SOLN COMPARISON:  Carotid ultrasound 11/18/20 FINDINGS: Time-of-flight images demonstrate no significant flow disturbance at either carotid bifurcation. Postcontrast images demonstrated a 3 vessel arch configuration. Right bifurcation demonstrates mild irregularity. Moderate distal stenosis is present just below the skull base lumen is narrowed to 1.5 mm. This compares with a distal measurement of 4 mm. The left common carotid artery is within normal limits. There is some atherosclerotic irregularity of the proximal ICA without significant stenosis. Moderate to high-grade stenosis is present proximal cavernous left ICA. Left vertebral artery is dominant. No significant stenoses are present in the neck. Both vertebral arteries originate from the subclavian arteries. IMPRESSION: 1. Moderate distal right ICA stenosis just below the skull base. 2. Moderate to  high-grade stenosis of the proximal cavernous left ICA. 3. Mild atherosclerotic disease as previously reported in the carotid bifurcations bilaterally without significant bifurcation stenosis. 4. Left vertebral artery is dominant. No significant vertebral artery stenoses are present in the neck. Electronically Signed   By: Marin Roberts M.D.   On: 03/12/2021 14:13   MR BRAIN WO CONTRAST  Result Date: 03/11/2021 CLINICAL DATA:  Hemiplegia and hemiparesis following cerebral infarction affecting unspecified side. EXAM: MRI HEAD WITHOUT CONTRAST TECHNIQUE: Multiplanar, multiecho pulse sequences of the brain and surrounding structures were  obtained without intravenous contrast. COMPARISON:  None. FINDINGS: Brain: Scattered foci of restricted diffusion in the right cerebral hemisphere along the ACA-MCA and MCA-PCA watershed zone. Small focus of restricted diffusion in the deep white matter of the left inferior parietal lobe. No hemorrhage, hydrocephalus, mass lesion or extra-axial collection. Remote lacunar infarcts in the left thalamus, bilateral basal ganglia and posterior right medulla. Area of encephalomalacia in the right cerebellar hemisphere. Scattered and confluent foci of T2 hyperintensity are seen within the white matter of cerebral hemispheres and within the pons, non specific, most likely related to chronic small vessel ischemia. Moderate parenchymal volume loss. Vascular: Normal flow voids. Skull and upper cervical spine: Normal marrow signal. Sinuses/Orbits: Negative. Other: None. IMPRESSION: 1. Acute/subacute right ACA-MCA and MCA-PCA watershed infarcts. 2. Small left parietal acute/subacute infarct. 3. Remote infarcts in the left thalamus, bilateral basal ganglia, posterior right medulla and right cerebellar hemisphere. 4. Advanced chronic microvascular ischemic changes of the white matter. Electronically Signed   By: Baldemar Lenis M.D.   On: 03/11/2021 17:00     Procedures Procedures   Medications Ordered in ED Medications  sodium chloride 0.9 % bolus 500 mL (has no administration in time range)  sodium chloride 0.9 % bolus 1,000 mL (1,000 mLs Intravenous New Bag/Given 03/12/21 1358)  gadobutrol (GADAVIST) 1 MMOL/ML injection 7 mL (7 mLs Intravenous Contrast Given 03/12/21 1400)    ED Course  I have reviewed the triage vital signs and the nursing notes.  Pertinent labs & imaging results that were available during my care of the patient were reviewed by me and considered in my medical decision making (see chart for details). CRITICAL CARE Performed by: Bethann Berkshire Total critical care time: 40 minutes Critical care time was exclusive of separately billable procedures and treating other patients. Critical care was necessary to treat or prevent imminent or life-threatening deterioration. Critical care was time spent personally by me on the following activities: development of treatment plan with patient and/or surrogate as well as nursing, discussions with consultants, evaluation of patient's response to treatment, examination of patient, obtaining history from patient or surrogate, ordering and performing treatments and interventions, ordering and review of laboratory studies, ordering and review of radiographic studies, pulse oximetry and re-evaluation of patient's condition.  I spoke with Dr. Selina Cooley neurology and she reviewed the patient's MRI and MRI and recommended admission to the hospital with keeping the patient's blood pressure over 130 systolic MDM Rules/Calculators/A&P                           Patient with an acute stroke.  Medicine will admit Final Clinical Impression(s) / ED Diagnoses Final diagnoses:  Weakness    Rx / DC Orders ED Discharge Orders     None        Bethann Berkshire, MD 03/12/21 1530

## 2021-03-12 NOTE — ED Notes (Signed)
Patient transported to MRI 

## 2021-03-12 NOTE — ED Triage Notes (Signed)
Pt presents to ED via stretcher from the [facility] for abnormal MRI. Pt states he had an MRI to check on his old strokes and it come back abnormal.

## 2021-03-12 NOTE — ED Notes (Signed)
IV infiltrated attempting an IV now.

## 2021-03-12 NOTE — Plan of Care (Signed)
Neurology Plan of Care  75 yo gentleman with hx HTN, DM2, PVD, multiple ischemic infarcts 01/18/21 with residual R sided weakness now nonambulatory residing at [facility] who is brought in to [facility] ED after outpatient MRI showed new acute/subacute watershed infarcts in R ACA-MCA and R>>L MCA-PCA territories. It is unclear to me from the chart why the repeat brain MRI was ordered although patient told EDP that it was for follow up of his old strokes.  Recommendations:  - Permissive HTN x48 hrs goal BP <220/110. PRN labetalol or hydralazine if BP above these parameters. Avoid oral antihypertensives. After 48 hrs goal SBP may be reduced to 130-150. Patient should not be maintained lower than systolic 130 going forward 2/2 his severe cerebrovascular stenoses - MRA H&N or CTA H&N - No indication to repeat TTE - Continue aspirin 81mg  daily + plavix 75mg  daily  Dr. will call me after vascular imaging completed so that we can discuss admission to [facility] vs [facility].  , MD [facility] [phone]  If 7pm- 7am, please page neurology on call as listed in AMION.

## 2021-03-12 NOTE — Progress Notes (Addendum)
The patient was sent to the ED by the nursing home facility because of MRI findings from yesterday.  I saw the patient in follow-up for his hospitalization for stroke. He was seen in the office September 9th and on my examination, was noted to be significantly weaker based upon my exam during his hospitalization.  ((((Exam 03-02-21 There is a dense left hemiparesis. Left upper extremity 2/5 proximally and hand movements 0. Left lower extremity proximally 3 as in hip flexion and dorsiflexion 0))))   Based upon this, and unclear of the exact onset of his new weakness and current maximal medical care, repeat MRI was obtained but the patient was not sent to the emergency room at that time due to the unclear time of onset and the fact that he already is on maximum medical care.    Repeat imaging was done which is reviewed today and shows  subacute right medial temporal and occipital infarct consistent with the subacute PCA infarct. Given the above, admission is not recommended given that his exam has been unchanged since his at 03-02-21,  he has been fully worked couple times in the past few months and is on maximum medical treatment for multivessel  moderate to severe intracranial occlusive disease.

## 2021-03-12 NOTE — Discharge Instructions (Addendum)
Patient's MRI evaluated by Dr. Gerilyn Pilgrim neurology that seen him before.  No significant change symptoms since September 6.  He is cleared patient to return back to nursing facility.  Stated that admission not recommended nor needed.  Patient safe to return back to nursing facility

## 2021-03-12 NOTE — ED Provider Notes (Signed)
Dr. Calton Golds the hospitalist had Dr. Gerilyn Pilgrim review the MRI results.  He states that there is been no significant change since September 6.  And patient has had extensive work-up admission not indicated.  Patient safe to return back to nursing facility.   Vanetta Mulders, MD 03/12/21 [phone]

## 2021-03-17 ENCOUNTER — Other Ambulatory Visit (HOSPITAL_COMMUNITY)
Admission: RE | Admit: 2021-03-17 | Discharge: 2021-03-17 | Disposition: A | Discharge status: Home / Self Care | Payer: Medicare Other | Source: Skilled Nursing Facility | Attending: Adult Health | Admitting: Adult Health

## 2021-03-17 DIAGNOSIS — E039 Hypothyroidism, unspecified: Secondary | ICD-10-CM | POA: Diagnosis not present

## 2021-03-17 LAB — TSH: TSH: 2.566 u[IU]/mL (ref 0.350–4.500)

## 2021-03-29 ENCOUNTER — Encounter: Payer: Self-pay | Admitting: Internal Medicine

## 2021-03-29 ENCOUNTER — Non-Acute Institutional Stay (SKILLED_NURSING_FACILITY): Payer: Medicare Other | Admitting: Internal Medicine

## 2021-03-29 DIAGNOSIS — D649 Anemia, unspecified: Secondary | ICD-10-CM

## 2021-03-29 DIAGNOSIS — N1831 Chronic kidney disease, stage 3a: Secondary | ICD-10-CM | POA: Diagnosis not present

## 2021-03-29 DIAGNOSIS — R4189 Other symptoms and signs involving cognitive functions and awareness: Secondary | ICD-10-CM | POA: Diagnosis not present

## 2021-03-29 DIAGNOSIS — E441 Mild protein-calorie malnutrition: Secondary | ICD-10-CM

## 2021-03-29 DIAGNOSIS — R29818 Other symptoms and signs involving the nervous system: Secondary | ICD-10-CM | POA: Diagnosis not present

## 2021-03-29 DIAGNOSIS — E1149 Type 2 diabetes mellitus with other diabetic neurological complication: Secondary | ICD-10-CM | POA: Diagnosis not present

## 2021-03-29 DIAGNOSIS — N183 Chronic kidney disease, stage 3 unspecified: Secondary | ICD-10-CM | POA: Insufficient documentation

## 2021-03-29 NOTE — Assessment & Plan Note (Addendum)
Current A1c has improved from 8.6 to  8.3%.  Goal will be an A1c less than 8% as long as there is no hypoglycemia which would mimic TIAs. There has been slight progression of his CKD from stage II to stage IIIa.  This will be monitored closely.

## 2021-03-29 NOTE — Assessment & Plan Note (Signed)
Current H/H 12.1/39.8;prior H/H 11.8/35.6 Anemia improved  ROS negative for bleeding dyscrasias & none reported by Staff Monitor CBC

## 2021-03-29 NOTE — Assessment & Plan Note (Signed)
On exam he is confabulating Discuss formal MMSE w NP

## 2021-03-29 NOTE — Patient Instructions (Signed)
See assessment and plan under each diagnosis in the problem list and acutely for this visit 

## 2021-03-29 NOTE — Progress Notes (Signed)
NURSING HOME LOCATION: [facility] ROOM NUMBER:  [id number] CODE STATUS:  DNR PCP:  Synthia Innocent NP  This is a nursing facility follow up visit of chronic medical diagnoses & to comply with Medicare regulations for SNF resident monitor. Interim medical record and care since last SNF visit was updated with review of diagnostic studies and change in clinical status since last visit were documented.  HPI: He was originally admitted to the [facility] 01/22/2021 following hospitalization for acute CVA associated with falls.  This actually was his second hospital admission as he had been admitted with an acute infarct in the right inferior cerebellum in May of this year. He returned to the ED with lower urinary tract infection 8/3 but was not admitted. Medical diagnoses include HTN, hypothyroidism, OA & DM with PVD.  Review of systems: He appears to be confabulating to some extent.  He rambled on about taking medicine" [id number] which can cysts in your chest which can rupture".  He describes "good and bad days" which he relates to his arthritis.  He also describes chronic anorexia.  He denies any specific acute cardiopulmonary or diabetic symptomatology.  He does have intermittent numbness and tingling in the right hand.  Constitutional: No fever, significant weight change  Eyes: No redness, discharge, pain, vision change ENT/mouth: No nasal congestion,  purulent discharge, earache, change in hearing, sore throat  Cardiovascular: No chest pain, palpitations, paroxysmal nocturnal dyspnea, claudication, edema  Respiratory: No cough, sputum production, hemoptysis, DOE, significant snoring, apnea   Gastrointestinal: No heartburn, dysphagia, abdominal pain, nausea /vomiting, rectal bleeding, melena, change in bowels Genitourinary: No dysuria, hematuria, pyuria, incontinence, nocturia Musculoskeletal: No joint stiffness, joint swelling, weakness, pain Dermatologic: No rash, pruritus, change in  appearance of skin Neurologic: No dizziness, headache, syncope, seizures Psychiatric: No significant anxiety, depression, insomnia, anorexia Endocrine: No change in hair/skin/nails, excessive thirst, excessive hunger, excessive urination  Hematologic/lymphatic: No significant bruising, lymphadenopathy, abnormal bleeding Allergy/immunology: No itchy/watery eyes, significant sneezing, urticaria, angioedema  Physical exam:  Pertinent or positive findings: Initially he was asleep but quickly aroused.  His hair is full & starkly white.  There are slightly keratotic/exfoliative changes of the anterior hairline and a single keratotic papule @ the L forehead.  He has bilateral ptosis, greater on the left.  The right nasolabial fold is slightly decreased.  Heart sounds are distant and the rhythm is clinically slightly irregular.  Pedal pulses are decreased.  He has dramatic arthritic changes of the hands with isolated Swan neck deformities and ulnar deviation of the hands.  There is gross deformity of several toes especially of the left foot.  Posterior ankle and heel are dressed.  There is an irregular corn of the right great toe.  General appearance: Adequately nourished; no acute distress, increased work of breathing is present.   Lymphatic: No lymphadenopathy about the head, neck, axilla. Eyes: No conjunctival inflammation or lid edema is present. There is no scleral icterus. Ears:  External ear exam shows no significant lesions or deformities.   Nose:  External nasal examination shows no deformity or inflammation. Nasal mucosa are pink and moist without lesions, exudates Oral exam:  Lips and gums are healthy appearing. There is no oropharyngeal erythema or exudate. Neck:  No thyromegaly, masses, tenderness noted.    Heart:  No gallop, murmur, click, rub .  Lungs: without wheezes, rhonchi, rales, rubs. Abdomen: Bowel sounds are normal. Abdomen is soft and nontender with no organomegaly, hernias,  masses. GU: Deferred  Extremities:  No cyanosis, clubbing, edema  Neurologic exam :Balance, Rhomberg, finger to nose testing could not be completed due to clinical state Skin: Warm & dry w/o tenting.  See summary under each active problem in the Problem List with associated updated therapeutic plan

## 2021-03-29 NOTE — Assessment & Plan Note (Signed)
Total protein now low normal.

## 2021-03-29 NOTE — Assessment & Plan Note (Signed)
Current creatinine 1.25 / GFR 59  ; CKD Stage 3a Medication List reviewed. If CKD progresses Metformin will be weaned or discontinued.

## 2021-03-31 DIAGNOSIS — R1312 Dysphagia, oropharyngeal phase: Secondary | ICD-10-CM | POA: Diagnosis not present

## 2021-03-31 DIAGNOSIS — I69351 Hemiplegia and hemiparesis following cerebral infarction affecting right dominant side: Secondary | ICD-10-CM | POA: Diagnosis not present

## 2021-03-31 DIAGNOSIS — Z23 Encounter for immunization: Secondary | ICD-10-CM | POA: Diagnosis not present

## 2021-04-02 DIAGNOSIS — Z23 Encounter for immunization: Secondary | ICD-10-CM | POA: Diagnosis not present

## 2021-04-07 ENCOUNTER — Non-Acute Institutional Stay (SKILLED_NURSING_FACILITY): Payer: Medicare Other | Admitting: Adult Health

## 2021-04-07 ENCOUNTER — Encounter: Payer: Self-pay | Admitting: Adult Health

## 2021-04-07 DIAGNOSIS — E1122 Type 2 diabetes mellitus with diabetic chronic kidney disease: Secondary | ICD-10-CM

## 2021-04-07 DIAGNOSIS — N183 Chronic kidney disease, stage 3 unspecified: Secondary | ICD-10-CM

## 2021-04-07 DIAGNOSIS — E1149 Type 2 diabetes mellitus with other diabetic neurological complication: Secondary | ICD-10-CM

## 2021-04-07 DIAGNOSIS — I639 Cerebral infarction, unspecified: Secondary | ICD-10-CM

## 2021-04-07 DIAGNOSIS — E785 Hyperlipidemia, unspecified: Secondary | ICD-10-CM | POA: Diagnosis not present

## 2021-04-07 DIAGNOSIS — E1169 Type 2 diabetes mellitus with other specified complication: Secondary | ICD-10-CM

## 2021-04-07 DIAGNOSIS — R634 Abnormal weight loss: Secondary | ICD-10-CM | POA: Diagnosis not present

## 2021-04-07 NOTE — Progress Notes (Signed)
Location:  [facility] Room Number: 132-P Place of Service:  SNF (31)   CODE STATUS: DNR  No Known Allergies  Chief Complaint  Patient presents with   Acute Visit    Weight Loss    HPI:  He is losing weight. 01-22-21: 170 pounds October weight 147.4 pounds today is 143.6 pounds. His appetite is poor. He is complaining of nausea "all the time"; which is interfering with his ability to eat. His cbg readings are stable. He is presently taking metformin 1 gm twice daily lipitor 80 mg daily janvuai 50 mg daily.   Past Medical History:  Diagnosis Date   Hypertension    Benign Essential   Hypothyroidism    Obesity    Osteoarthritis    PVD (peripheral vascular disease) ([place])    Type 2 diabetes mellitus ([place])     Past Surgical History:  Procedure Laterality Date   APPENDECTOMY     TONSILLECTOMY      Social History   Socioeconomic History   Marital status: Single    Spouse name: Not on file   Number of children: Not on file   Years of education: 12   Highest education level: Not on file  Occupational History   Occupation: RETIRED  Tobacco Use   Smoking status: Former   Smokeless tobacco: Never  Scientific laboratory technician Use: Never used  Substance and Sexual Activity   Alcohol use: Not Currently   Drug use: Never   Sexual activity: Not Currently  Other Topics Concern   Not on file  Social History Narrative   Not on file   Social Determinants of Health   Financial Resource Strain: Not on file  Food Insecurity: Not on file  Transportation Needs: Not on file  Physical Activity: Not on file  Stress: Not on file  Social Connections: Not on file  Intimate Partner Violence: Not on file   Family History  Problem Relation Age of Onset   Osteoporosis Mother    Hypertension Mother    Cancer Mother    Heart attack Father    Heart attack Maternal Grandmother    Pneumonia Maternal Grandfather    Heart disease Paternal Grandmother    Heart disease  Paternal Grandfather       VITAL SIGNS BP 108/70   Pulse 99   Temp 98.1 F (36.7 C)   Resp 20   Ht 5' 11"  (1.803 m)   Wt 147 lb 6.4 oz (66.9 kg)   SpO2 96%   BMI 20.56 kg/m   Outpatient Encounter Medications as of 04/07/2021  Medication Sig   aspirin EC 81 MG tablet Take 1 tablet (81 mg total) by mouth daily. Swallow whole.   atorvastatin (LIPITOR) 80 MG tablet Take 1 tablet (80 mg total) by mouth every evening.   clopidogrel (PLAVIX) 75 MG tablet Take 75 mg by mouth daily.   feeding supplement, GLUCERNA SHAKE, (GLUCERNA SHAKE) LIQD Take 237 mLs by mouth in the morning and at bedtime. 9 am and 9 pm   levothyroxine (SYNTHROID) 75 MCG tablet Take 75 mcg by mouth daily before breakfast.   memantine (NAMENDA XR) 7 MG CP24 24 hr capsule Take 7 mg by mouth daily.   metFORMIN (GLUCOPHAGE) 1000 MG tablet Take 1,000 mg by mouth 2 (two) times daily with a meal.   miconazole (ZEASORB-AF) 2 % powder Apply 1 application topically as needed for itching (to groin and scrotum area substituted with shower to).   NON  FORMULARY Diet Change: Dysphagia 3 diet with dysphagia 2 meats, thin liquids (NAS, ConCho)   omeprazole (PRILOSEC OTC) 20 MG tablet Take 20 mg by mouth at bedtime.   ondansetron (ZOFRAN-ODT) 8 MG disintegrating tablet Take 8 mg by mouth every 6 (six) hours as needed for nausea.   senna-docusate (SENOKOT-S) 8.6-50 MG tablet Take 2 tablets by mouth 2 (two) times daily. hold for diarrhea   sitaGLIPtin (JANUVIA) 50 MG tablet Take 50 mg by mouth daily.   triamcinolone cream (KENALOG) 0.1 % Apply 1 application topically 2 (two) times daily. Apply to all areas of psoriasis   No facility-administered encounter medications on file as of 04/07/2021.     SIGNIFICANT DIAGNOSTIC EXAMS  PREVIOUS  01-18-21: ct of head:  Brain: Mild chronic ischemic white matter disease is noted. Mild diffuse cortical atrophy is noted. No mass effect or midline shift is noted. Ventricular size is within normal  limits. There is no evidence of mass lesion, hemorrhage or acute infarction. Vascular: No hyperdense vessel or unexpected calcification. Skull: Normal. Negative for fracture or focal lesion. Sinuses/Orbits: No acute finding.  01-18-21: MRI/MRA of brain MRI: 1. Acute infarct in the posterolateral right medulla, likely right PICA territory. Additional acute right PCA territory infarcts in the right occipital lobe and posterior right corpus callosum. Associated mild edema without mass effect. 2. Developing encephalomalacia in the areas of previously seen infarct in the inferior right cerebellum. 3. Remote lacunar infarcts in the left thalamus, right caudate, and bilateral corona radiata. 4. Moderate chronic microvascular disease. MRA: 1. Severe stenosis of bilateral P2 PCAs with poor flow related signal in the more distal PCAs. Also, severe stenosis of the distal left M1 MCA, bilateral M2 MCA branches and bilateral A2 ACAs. 2. Similar multifocal moderate narrowing of the distal right intradural vertebral artery. Similar flow related signal in the proximal right PICA, but not the more distal right PICA.  01-19-21: 2-d echo:  1. Left ventricular ejection fraction, by estimation, is 70 to 75%. The  left ventricle has hyperdynamic function. The left ventricle has no  regional wall motion abnormalities. There is mild left ventricular  hypertrophy. Left ventricular diastolic  parameters are indeterminate.   01-28-21: chest x-ray:  1.  Prior CABG.  Heart size normal. 2. Mild left base subsegmental atelectasis. Tiny left pleural effusion cannot be excluded.  3. Right posterior eighth rib fracture. This may be acute. No pneumothorax.  01-28-21: KUB 1. Nonobstructive bowel gas pattern. 2. Prominent colonic stool volume. Imaging appearance raises the question of chronic constipation.  01-28-21: EKG Sinus rhythm with first degree AV block; PAC: R BBB  NO NEW EXAMS.   LABS REVIEWED: PREVIOUS   01-18-21:  wbc 13.5; hgb 13.2; hct 40.5; mcv 96.4 plt 377; glucose 260; bun 22; creat 1.19; k+ 4.9; na++ 137; ca 9.6 GFR>60; alk phos 161;  albumin 3.6; urine culture: staphylococcus aureus 01-20-21: wbc 11.6; hgb 12.8; hct 39.0; mcv 96.5 plt 333; glucose 207; bun 26; creat 1.22; k+ 5.1; na++ 139; ca 9.2 GFR>60; hgb a1c 8.3; chol 217; LDL 150; trig 151; hdl 37 01-21-21: wbc 13.5; hgb 13.6; hct 41.6; mcv 95.9 plt 390; glucose 243; bun 31;creat 1.28; k+ 5.1; na++ 137; ca 9.5 GFR 59; alk phos 134; albumin 3.2 mag 1.8   01-27-21: wbc 20.8; hgb 14.6; hct 43.3; mcv 92.7 pt 468; glucose 414; bun 41; creat 1.58; k+ 5.6; na++ 131; ca 9.5; GFR 45; alk phos 142; albumin 3.4 troponin #1: 10; #2: 13 01-28-21: wbc 17.3; hgb  13.5; hct 40.4; mcv 94.0 plt 437; glucose 292; bun 42; creat 1.39; k+ 4.9; na++ 130; ca 9.2 GFR 52; troponin #3 12. Mag 1.8   NO NEW LABS.  Review of Systems  Constitutional:  Positive for weight loss. Negative for malaise/fatigue.  Respiratory:  Negative for cough and shortness of breath.   Cardiovascular:  Negative for chest pain, palpitations and leg swelling.  Gastrointestinal:  Positive for nausea. Negative for abdominal pain, constipation, heartburn and vomiting.  Musculoskeletal:  Negative for back pain, joint pain and myalgias.  Skin: Negative.   Neurological:  Negative for dizziness.  Psychiatric/Behavioral:  The patient is not nervous/anxious.     Physical Exam Constitutional:      General: He is not in acute distress.    Appearance: He is well-developed. He is not diaphoretic.  Neck:     Thyroid: No thyromegaly.  Cardiovascular:     Rate and Rhythm: Normal rate and regular rhythm.     Pulses: Normal pulses.     Heart sounds: Normal heart sounds.  Pulmonary:     Effort: Pulmonary effort is normal. No respiratory distress.     Breath sounds: Normal breath sounds.  Abdominal:     General: Bowel sounds are normal. There is no distension.     Palpations: Abdomen is soft.     Tenderness:  There is no abdominal tenderness.  Musculoskeletal:     Cervical back: Neck supple.     Right lower leg: No edema.     Left lower leg: No edema.     Comments:   Has increased weakness on left upper and lower extremity   Lymphadenopathy:     Cervical: No cervical adenopathy.  Skin:    General: Skin is warm and dry.  Neurological:     Mental Status: He is alert. Mental status is at baseline.  Psychiatric:        Mood and Affect: Mood normal.     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

## 2021-04-08 ENCOUNTER — Other Ambulatory Visit (HOSPITAL_COMMUNITY)
Admission: RE | Admit: 2021-04-08 | Discharge: 2021-04-08 | Disposition: A | Discharge status: Home / Self Care | Payer: Medicare Other | Source: Skilled Nursing Facility | Attending: Adult Health | Admitting: Adult Health

## 2021-04-08 DIAGNOSIS — Z7984 Long term (current) use of oral hypoglycemic drugs: Secondary | ICD-10-CM | POA: Diagnosis not present

## 2021-04-08 DIAGNOSIS — R1312 Dysphagia, oropharyngeal phase: Secondary | ICD-10-CM | POA: Diagnosis not present

## 2021-04-08 DIAGNOSIS — E785 Hyperlipidemia, unspecified: Secondary | ICD-10-CM | POA: Diagnosis not present

## 2021-04-08 DIAGNOSIS — E1159 Type 2 diabetes mellitus with other circulatory complications: Secondary | ICD-10-CM | POA: Insufficient documentation

## 2021-04-08 DIAGNOSIS — I69351 Hemiplegia and hemiparesis following cerebral infarction affecting right dominant side: Secondary | ICD-10-CM | POA: Diagnosis not present

## 2021-04-08 LAB — LIPID PANEL
Cholesterol: 125 mg/dL (ref 0–200)
HDL: 24 mg/dL — ABNORMAL LOW (ref >40)
LDL Cholesterol: 58 mg/dL (ref 0–99)
Total CHOL/HDL Ratio: 5.2 RATIO
Triglycerides: 213 mg/dL — ABNORMAL HIGH (ref <150)
VLDL: 43 mg/dL — ABNORMAL HIGH (ref 0–40)

## 2021-04-08 LAB — HEMOGLOBIN A1C
Hgb A1c MFr Bld: 6.1 % — ABNORMAL HIGH (ref 4.8–5.6)
Mean Plasma Glucose: 128.37 mg/dL

## 2021-04-08 LAB — HEPATIC FUNCTION PANEL
ALT: 14 U/L (ref 0–44)
AST: 21 U/L (ref 15–41)
Albumin: 3.5 g/dL (ref 3.5–5.0)
Alkaline Phosphatase: 82 U/L (ref 38–126)
Bilirubin, Direct: 0.4 mg/dL — ABNORMAL HIGH (ref 0.0–0.2)
Indirect Bilirubin: 0.7 mg/dL (ref 0.3–0.9)
Total Bilirubin: 1.1 mg/dL (ref 0.3–1.2)
Total Protein: 6.6 g/dL (ref 6.5–8.1)

## 2021-04-09 DIAGNOSIS — E1122 Type 2 diabetes mellitus with diabetic chronic kidney disease: Secondary | ICD-10-CM | POA: Insufficient documentation

## 2021-04-09 DIAGNOSIS — R634 Abnormal weight loss: Secondary | ICD-10-CM | POA: Insufficient documentation

## 2021-04-09 DIAGNOSIS — N183 Chronic kidney disease, stage 3 unspecified: Secondary | ICD-10-CM | POA: Insufficient documentation

## 2021-04-12 DIAGNOSIS — I69351 Hemiplegia and hemiparesis following cerebral infarction affecting right dominant side: Secondary | ICD-10-CM | POA: Diagnosis not present

## 2021-04-12 DIAGNOSIS — R1312 Dysphagia, oropharyngeal phase: Secondary | ICD-10-CM | POA: Diagnosis not present

## 2021-04-13 DIAGNOSIS — I69351 Hemiplegia and hemiparesis following cerebral infarction affecting right dominant side: Secondary | ICD-10-CM | POA: Diagnosis not present

## 2021-04-13 DIAGNOSIS — R1312 Dysphagia, oropharyngeal phase: Secondary | ICD-10-CM | POA: Diagnosis not present

## 2021-04-14 DIAGNOSIS — I69351 Hemiplegia and hemiparesis following cerebral infarction affecting right dominant side: Secondary | ICD-10-CM | POA: Diagnosis not present

## 2021-04-14 DIAGNOSIS — R1312 Dysphagia, oropharyngeal phase: Secondary | ICD-10-CM | POA: Diagnosis not present

## 2021-04-15 DIAGNOSIS — I69351 Hemiplegia and hemiparesis following cerebral infarction affecting right dominant side: Secondary | ICD-10-CM | POA: Diagnosis not present

## 2021-04-15 DIAGNOSIS — R1312 Dysphagia, oropharyngeal phase: Secondary | ICD-10-CM | POA: Diagnosis not present

## 2021-04-19 DIAGNOSIS — R1312 Dysphagia, oropharyngeal phase: Secondary | ICD-10-CM | POA: Diagnosis not present

## 2021-04-19 DIAGNOSIS — I69351 Hemiplegia and hemiparesis following cerebral infarction affecting right dominant side: Secondary | ICD-10-CM | POA: Diagnosis not present

## 2021-04-20 DIAGNOSIS — Z23 Encounter for immunization: Secondary | ICD-10-CM | POA: Diagnosis not present

## 2021-04-21 DIAGNOSIS — Z1159 Encounter for screening for other viral diseases: Secondary | ICD-10-CM | POA: Diagnosis not present

## 2021-04-21 DIAGNOSIS — I69351 Hemiplegia and hemiparesis following cerebral infarction affecting right dominant side: Secondary | ICD-10-CM | POA: Diagnosis not present

## 2021-04-22 DIAGNOSIS — R1312 Dysphagia, oropharyngeal phase: Secondary | ICD-10-CM | POA: Diagnosis not present

## 2021-04-22 DIAGNOSIS — I69351 Hemiplegia and hemiparesis following cerebral infarction affecting right dominant side: Secondary | ICD-10-CM | POA: Diagnosis not present

## 2021-04-23 DIAGNOSIS — R1312 Dysphagia, oropharyngeal phase: Secondary | ICD-10-CM | POA: Diagnosis not present

## 2021-04-23 DIAGNOSIS — I69351 Hemiplegia and hemiparesis following cerebral infarction affecting right dominant side: Secondary | ICD-10-CM | POA: Diagnosis not present

## 2021-04-24 DIAGNOSIS — R1312 Dysphagia, oropharyngeal phase: Secondary | ICD-10-CM | POA: Diagnosis not present

## 2021-04-24 DIAGNOSIS — I69351 Hemiplegia and hemiparesis following cerebral infarction affecting right dominant side: Secondary | ICD-10-CM | POA: Diagnosis not present

## 2021-04-26 ENCOUNTER — Non-Acute Institutional Stay (INDEPENDENT_AMBULATORY_CARE_PROVIDER_SITE_OTHER): Payer: Medicare Other | Admitting: Adult Health

## 2021-04-26 ENCOUNTER — Encounter: Payer: Self-pay | Admitting: Adult Health

## 2021-04-26 DIAGNOSIS — Z0000 Encounter for general adult medical examination without abnormal findings: Secondary | ICD-10-CM | POA: Diagnosis not present

## 2021-04-26 DIAGNOSIS — R1312 Dysphagia, oropharyngeal phase: Secondary | ICD-10-CM | POA: Diagnosis not present

## 2021-04-26 DIAGNOSIS — I69351 Hemiplegia and hemiparesis following cerebral infarction affecting right dominant side: Secondary | ICD-10-CM | POA: Diagnosis not present

## 2021-04-26 NOTE — Progress Notes (Signed)
Subjective:   Jason Cox is a 75 y.o. male who presents for Medicare Annual/Subsequent preventive examination.  Review of Systems    Review of Systems  Constitutional:  Negative for malaise/fatigue.  Respiratory:  Negative for cough and shortness of breath.   Cardiovascular:  Negative for chest pain, palpitations and leg swelling.  Gastrointestinal:  Negative for abdominal pain, constipation and heartburn.  Musculoskeletal:  Negative for back pain, joint pain and myalgias.  Skin: Negative.   Neurological:  Negative for dizziness.  Psychiatric/Behavioral:  The patient is not nervous/anxious.    Cardiac Risk Factors include: advanced age (>55men, >28 women);diabetes mellitus;hypertension;male gender;sedentary lifestyle     Objective:    Today's Vitals   04/26/21 1059 04/26/21 1337  BP: 121/70   Pulse: 78   Resp: 20   Temp: 98.6 F (37 C)   SpO2: 96%   Weight: 147 lb 6.4 oz (66.9 kg)   Height:  (1.803 m)   PainSc:  0-No pain   Body mass index is 20.56 kg/m.  Advanced Directives 04/26/2021 04/07/2021 03/12/2021 03/11/2021 03/02/2021 02/10/2021 02/05/2021  Does Patient Have a Medical Advance Directive? Yes Yes Yes Yes Yes Yes Yes  Type of Advance Directive Out of facility DNR (pink MOST or yellow form) Out of facility DNR (pink MOST or yellow form) Out of facility DNR (pink MOST or yellow form) Out of facility DNR (pink MOST or yellow form) Out of facility DNR (pink MOST or yellow form) Out of facility DNR (pink MOST or yellow form) Out of facility DNR (pink MOST or yellow form)  Does patient want to make changes to medical advance directive? No - Patient declined No - Patient declined - No - Patient declined No - Patient declined - No - Patient declined  Would patient like information on creating a medical advance directive? - - - - - - -  Pre-existing out of facility DNR order (yellow form or pink MOST form) - - - - - - -    Current Medications (verified) Outpatient  Encounter Medications as of 04/26/2021  Medication Sig   aspirin EC 81 MG tablet Take 1 tablet (81 mg total) by mouth daily. Swallow whole.   atorvastatin (LIPITOR) 80 MG tablet Take 1 tablet (80 mg total) by mouth every evening.   clopidogrel (PLAVIX) 75 MG tablet Take 75 mg by mouth daily.   feeding supplement, GLUCERNA SHAKE, (GLUCERNA SHAKE) LIQD Take 237 mLs by mouth in the morning and at bedtime. 9 am and 9 pm   levothyroxine (SYNTHROID) 75 MCG tablet Take 75 mcg by mouth daily before breakfast.   memantine (NAMENDA XR) 7 MG CP24 24 hr capsule Take 7 mg by mouth daily.   miconazole (ZEASORB-AF) 2 % powder Apply 1 application topically as needed for itching (to groin and scrotum area substituted with shower to).   NON FORMULARY Diet Change: Dysphagia 3 diet with dysphagia 2 meats, thin liquids (NAS, ConCho)   omeprazole (PRILOSEC OTC) 20 MG tablet Take 20 mg by mouth at bedtime.   ondansetron (ZOFRAN-ODT) 8 MG disintegrating tablet Take 8 mg by mouth every 6 (six) hours as needed for nausea.   senna-docusate (SENOKOT-S) 8.6-50 MG tablet Take 2 tablets by mouth 2 (two) times daily. hold for diarrhea   sitaGLIPtin (JANUVIA) 50 MG tablet Take 50 mg by mouth daily.   triamcinolone cream (KENALOG) 0.1 % Apply 1 application topically 2 (two) times daily. Apply to all areas of psoriasis   [DISCONTINUED] metFORMIN (GLUCOPHAGE) 1000 MG  tablet Take 1,000 mg by mouth 2 (two) times daily with a meal.   No facility-administered encounter medications on file as of 04/26/2021.    Allergies (verified) Patient has no known allergies.   History: Past Medical History:  Diagnosis Date   Hypertension    Benign Essential   Hypothyroidism    Obesity    Osteoarthritis    PVD (peripheral vascular disease) ([facility])    Type 2 diabetes mellitus ([facility])    Past Surgical History:  Procedure Laterality Date   APPENDECTOMY     TONSILLECTOMY     Family History  Problem Relation Age of Onset   Osteoporosis  Mother    Hypertension Mother    Cancer Mother    Heart attack Father    Heart attack Maternal Grandmother    Pneumonia Maternal Grandfather    Heart disease Paternal Grandmother    Heart disease Paternal Grandfather    Social History   Socioeconomic History   Marital status: Single    Spouse name: Not on file   Number of children: Not on file   Years of education: 12   Highest education level: Not on file  Occupational History   Occupation: RETIRED  Tobacco Use   Smoking status: Former   Smokeless tobacco: Never  Building services engineer Use: Never used  Substance and Sexual Activity   Alcohol use: Not Currently   Drug use: Never   Sexual activity: Not Currently  Other Topics Concern   Not on file  Social History Narrative   Not on file   Social Determinants of Health   Financial Resource Strain: Not on file  Food Insecurity: Not on file  Transportation Needs: Not on file  Physical Activity: Not on file  Stress: Not on file  Social Connections: Not on file    Tobacco Counseling Counseling given: Not Answered   Clinical Intake:  Pre-visit preparation completed: Yes  Pain : No/denies pain Pain Score: 0-No pain     BMI - recorded: 20.56 Nutritional Status: BMI of 19-24  Normal Nutritional Risks: Unintentional weight loss Diabetes: Yes CBG done?: Yes (per facility) CBG resulted in Enter/ Edit results?: Yes (per facility) Did pt. bring in CBG monitor from home?: No  How often do you need to have someone help you when you read instructions, pamphlets, or other written materials from your doctor or pharmacy?: 5 - Always  Diabetic?yes  Interpreter Needed?: No      Activities of Daily Living In your present state of health, do you have any difficulty performing the following activities: 04/26/2021 01/18/2021  Hearing? N N  Vision? N Y  Difficulty concentrating or making decisions? Y N  Walking or climbing stairs? Y Y  Dressing or bathing? Y N  Doing  errands, shopping? Y N  Preparing Food and eating ? Y -  Using the Toilet? Y -  In the past six months, have you accidently leaked urine? Y -  Do you have problems with loss of bowel control? Y -  Managing your Medications? Y -  Managing your Finances? Y -  Housekeeping or managing your Housekeeping? Y -  Some recent data might be hidden    Patient Care Team: Sharee Holster, NP as PCP - General (Geriatric Medicine) Elfredia Nevins, MD as Referring Physician (Internal Medicine)  Indicate any recent Medical Services you may have received from other than [facility] providers in the past year (date may be approximate).     Assessment:  This is a routine wellness examination for [place].  Hearing/Vision screen No results found.  Dietary issues and exercise activities discussed: Current Exercise Habits: The patient does not participate in regular exercise at present   Goals Addressed             This Visit's Progress    Absence of Fall and Fall-Related Injury       Evidence-based guidance:  Assess fall risk using a validated tool when available. Consider balance and gait impairment, muscle weakness, diminished vision or hearing, environmental hazards, presence of urinary or bowel urgency and/or incontinence.  Communicate fall injury risk to interprofessional healthcare team.  Develop a fall prevention plan with the patient and family.  Promote use of personal vision and auditory aids.  Promote reorientation, appropriate sensory stimulation, and routines to decrease risk of fall when changes in mental status are present.  Assess assistance level required for safe and effective self-care; consider referral for home care.  Encourage physical activity, such as performance of self-care at highest level of ability, strength and balance exercise program, and provision of appropriate assistive devices; refer to rehabilitation therapy.  Refer to community-based fall prevention program where  available.  If fall occurs, determine the cause and revise fall injury prevention plan.  Regularly review medication contribution to fall risk; consider risk related to polypharmacy and age.  Refer to pharmacist for consultation when concerns about medications are revealed.  Balance adequate pain management with potential for oversedation.  Provide guidance related to environmental modifications.  Consider supplementation with Vitamin D.   Notes:      Follow up with Primary Care Provider       General - Client will not be readmitted within 30 days ([facility])         Depression Screen PHQ 2/9 Scores 04/26/2021  PHQ - 2 Score 0    Fall Risk Fall Risk  04/26/2021  Falls in the past year? 1  Number falls in past yr: 0  Injury with Fall? 0  Risk for fall due to : History of fall(s);Impaired balance/gait;Impaired mobility  Follow up Falls evaluation completed    FALL RISK PREVENTION PERTAINING TO THE HOME:  Any stairs in or around the home? No  If so, are there any without handrails? No  Home free of loose throw rugs in walkways, pet beds, electrical cords, etc? Yes  Adequate lighting in your home to reduce risk of falls? Yes   ASSISTIVE DEVICES UTILIZED TO PREVENT FALLS:  Life alert? No  Use of a cane, walker or w/c? Yes  Grab bars in the bathroom? Yes  Shower chair or bench in shower? Yes  Elevated toilet seat or a handicapped toilet? Yes   TIMED UP AND GO:  Was the test performed? No .  Length of time to ambulate nonambulatory    Gait unsteady with use of assistive device, provider informed and education provided.   Cognitive Function: MMSE - Mini Mental State Exam 04/26/2021  Not completed: Unable to complete        Immunizations Immunization History  Administered Date(s) Administered   Influenza-Unspecified 03/31/2021   Janssen (J&J) SARS-COV-2 Vaccination 10/04/2019   Moderna Covid-19 Vaccine Bivalent Booster 7yrs & up 04/20/2021   Moderna Sars-Covid-2  Vaccination 02/10/2021   Pneumococcal-Unspecified 02/06/2010, 04/02/2021    TDAP status: Due, Education has been provided regarding the importance of this vaccine. Advised may receive this vaccine at local pharmacy or Health Dept. Aware to provide a copy of the vaccination record if  obtained from local pharmacy or Health Dept. Verbalized acceptance and understanding.  Flu Vaccine status: Up to date  Pneumococcal vaccine status: Up to date  Covid-19 vaccine status: Completed vaccines  Qualifies for Shingles Vaccine? Yes   Zostavax completed No   Shingrix Completed?: No.    Education has been provided regarding the importance of this vaccine. Patient has been advised to call insurance company to determine out of pocket expense if they have not yet received this vaccine. Advised may also receive vaccine at local pharmacy or Health Dept. Verbalized acceptance and understanding.  Screening Tests Health Maintenance  Topic Date Due   URINE MICROALBUMIN  06/29/2021 (Originally 06/27/1955)   Hepatitis C Screening  07/27/2021 (Originally 06/27/1963)   TETANUS/TDAP  09/21/2021 (Originally 06/27/1964)   FOOT EXAM  10/27/2021 (Originally 06/27/1955)   OPHTHALMOLOGY EXAM  12/22/2021 (Originally 06/27/1955)   Zoster Vaccines- Shingrix (1 of 2) 02/22/2022 (Originally 06/27/1993)   HEMOGLOBIN A1C  10/07/2021   Pneumonia Vaccine 1+ Years old  Completed   INFLUENZA VACCINE  Completed   COVID-19 Vaccine  Completed   HPV VACCINES  Aged Out    Health Maintenance  There are no preventive care reminders to display for this patient.   Colorectal cancer screening: Type of screening: Cologuard. Completed ordered . Repeat every 5 years  Lung Cancer Screening: (Low Dose CT Chest recommended if Age 61-80 years, 30 pack-year currently smoking OR have quit w/in 15years.) does not qualify.   Lung Cancer Screening Referral: n/a   Additional Screening:  Hepatitis C Screening: does qualify; Completed ordered    Vision Screening: Recommended annual ophthalmology exams for early detection of glaucoma and other disorders of the eye. Is the patient up to date with their annual eye exam?  No  Who is the provider or what is the name of the office in which the patient attends annual eye exams?  If pt is not established with a provider, would they like to be referred to a provider to establish care? No .   Dental Screening: Recommended annual dental exams for proper oral hygiene  Community Resource Referral / Chronic Care Management: CRR required this visit?  No   CCM required this visit?  No      Plan:     I have personally reviewed and noted the following in the patient's chart:   Medical and social history Use of alcohol, tobacco or illicit drugs  Current medications and supplements including opioid prescriptions. Patient is not currently taking opioid prescriptions. Functional ability and status Nutritional status Physical activity Advanced directives List of other physicians Hospitalizations, surgeries, and ER visits in previous 12 months Vitals Screenings to include cognitive, depression, and falls Referrals and appointments  In addition, I have reviewed and discussed with patient certain preventive protocols, quality metrics, and best practice recommendations. A written personalized care plan for preventive services as well as general preventive health recommendations were provided to patient.     Sharee Holster, NP   04/26/2021   Nurse Notes:

## 2021-04-26 NOTE — Progress Notes (Signed)
Provider: Thyra Breed   Location  [facility]  PCP: Sharee Holster, NP [email or web address] @  Extended Emergency Contact Information Primary Emergency Contact: Shanks,james Address: [address]          [place], [place] [address] [place] Home Phone: [phone] Mobile Phone: [phone] Relation: Brother  Codes status: DNR Goals of care: advanced directive information Advanced Directives 04/26/2021  Does Patient Have a Medical Advance Directive? Yes  Type of Advance Directive Out of facility DNR (pink MOST or yellow form)  Does patient want to make changes to medical advance directive? No - Patient declined  Would patient like information on creating a medical advance directive? -  Pre-existing out of facility DNR order (yellow form or pink MOST form) -     No Known Allergies  Chief Complaint  Patient presents with   Annual Exam    AWV    HPI  Past Medical History:  Diagnosis Date   Hypertension    Benign Essential   Hypothyroidism    Obesity    Osteoarthritis    PVD (peripheral vascular disease) (HCC)    Type 2 diabetes mellitus (HCC)    Past Surgical History:  Procedure Laterality Date   APPENDECTOMY     TONSILLECTOMY      reports that he has quit smoking. He has never used smokeless tobacco. He reports that he does not currently use alcohol. He reports that he does not use drugs. Social History   Tobacco Use   Smoking status: Former   Smokeless tobacco: Never  04/28/2021 Use: Never used  Substance Use Topics   Alcohol use: Not Currently   Drug use: Never   Family History  Problem Relation Age of Onset   Osteoporosis Mother    Hypertension Mother    Cancer Mother    Heart attack Father    Heart attack Maternal Grandmother    Pneumonia Maternal Grandfather    Heart disease Paternal Grandmother    Heart disease Paternal Grandfather     Pertinent  Health Maintenance Due  Topic Date Due   FOOT EXAM   Never done   OPHTHALMOLOGY EXAM  Never done   URINE MICROALBUMIN  Never done   INFLUENZA VACCINE  01/25/2021   HEMOGLOBIN A1C  10/07/2021   Fall Risk 01/21/2021 01/22/2021 01/27/2021 01/27/2021 03/12/2021  Patient Fall Risk Level High fall risk High fall risk Moderate fall risk High fall risk Moderate fall risk   No flowsheet data found.  Functional Status Survey:    Outpatient Encounter Medications as of 04/26/2021  Medication Sig   aspirin EC 81 MG tablet Take 1 tablet (81 mg total) by mouth daily. Swallow whole.   atorvastatin (LIPITOR) 80 MG tablet Take 1 tablet (80 mg total) by mouth every evening.   clopidogrel (PLAVIX) 75 MG tablet Take 75 mg by mouth daily.   feeding supplement, GLUCERNA SHAKE, (GLUCERNA SHAKE) LIQD Take 237 mLs by mouth in the morning and at bedtime. 9 am and 9 pm   levothyroxine (SYNTHROID) 75 MCG tablet Take 75 mcg by mouth daily before breakfast.   memantine (NAMENDA XR) 7 MG CP24 24 hr capsule Take 7 mg by mouth daily.   miconazole (ZEASORB-AF) 2 % powder Apply 1 application topically as needed for itching (to groin and scrotum area substituted with shower to).   NON FORMULARY Diet Change: Dysphagia 3 diet with dysphagia 2 meats, thin liquids (NAS, ConCho)   omeprazole (PRILOSEC OTC) 20 MG  tablet Take 20 mg by mouth at bedtime.   ondansetron (ZOFRAN-ODT) 8 MG disintegrating tablet Take 8 mg by mouth every 6 (six) hours as needed for nausea.   senna-docusate (SENOKOT-S) 8.6-50 MG tablet Take 2 tablets by mouth 2 (two) times daily. hold for diarrhea   sitaGLIPtin (JANUVIA) 50 MG tablet Take 50 mg by mouth daily.   triamcinolone cream (KENALOG) 0.1 % Apply 1 application topically 2 (two) times daily. Apply to all areas of psoriasis   [DISCONTINUED] metFORMIN (GLUCOPHAGE) 1000 MG tablet Take 1,000 mg by mouth 2 (two) times daily with a meal.   No facility-administered encounter medications on file as of 04/26/2021.     Vitals:   04/26/21 1059  BP: 121/70   Pulse: 78  Resp: 20  Temp: 98.6 F (37 C)  SpO2: 96%  Weight: 147 lb 6.4 oz (66.9 kg)  Height: 5\' 11"  (1.803 m)   Body mass index is 20.56 kg/m.  DIAGNOSTIC EXAMS   ROS  PHYS  ASSESSMENT/PLAN   Family/staff communication:   ORDERS:

## 2021-04-26 NOTE — Patient Instructions (Signed)
   Jason Cox , Thank you for taking time to come for your Medicare Wellness Visit. I appreciate your ongoing commitment to your health goals. Please review the following plan we discussed and let me know if I can assist you in the future.   These are the goals we discussed:  Goals      Absence of Fall and Fall-Related Injury     Evidence-based guidance:  Assess fall risk using a validated tool when available. Consider balance and gait impairment, muscle weakness, diminished vision or hearing, environmental hazards, presence of urinary or bowel urgency and/or incontinence.  Communicate fall injury risk to interprofessional healthcare team.  Develop a fall prevention plan with the patient and family.  Promote use of personal vision and auditory aids.  Promote reorientation, appropriate sensory stimulation, and routines to decrease risk of fall when changes in mental status are present.  Assess assistance level required for safe and effective self-care; consider referral for home care.  Encourage physical activity, such as performance of self-care at highest level of ability, strength and balance exercise program, and provision of appropriate assistive devices; refer to rehabilitation therapy.  Refer to community-based fall prevention program where available.  If fall occurs, determine the cause and revise fall injury prevention plan.  Regularly review medication contribution to fall risk; consider risk related to polypharmacy and age.  Refer to pharmacist for consultation when concerns about medications are revealed.  Balance adequate pain management with potential for oversedation.  Provide guidance related to environmental modifications.  Consider supplementation with Vitamin D.   Notes:      Follow up with Primary Care Provider     General - Client will not be readmitted within 30 days ([facility])        This is a list of the screening recommended for you and due dates:  Health  Maintenance  Topic Date Due   Urine Protein Check  06/29/2021*   Hepatitis C Screening: USPSTF Recommendation to screen - Ages 18-79 yo.  07/27/2021*   Tetanus Vaccine  09/21/2021*   Complete foot exam   10/27/2021*   Eye exam for diabetics  12/22/2021*   Zoster (Shingles) Vaccine (1 of 2) 02/22/2022*   Hemoglobin A1C  10/07/2021   Pneumonia Vaccine  Completed   Flu Shot  Completed   COVID-19 Vaccine  Completed   HPV Vaccine  Aged Out  *Topic was postponed. The date shown is not the original due date.

## 2021-04-27 ENCOUNTER — Other Ambulatory Visit (HOSPITAL_COMMUNITY)
Admission: RE | Admit: 2021-04-27 | Discharge: 2021-04-27 | Disposition: A | Discharge status: Home / Self Care | Payer: Medicare Other | Source: Skilled Nursing Facility | Attending: Adult Health | Admitting: Adult Health

## 2021-04-27 DIAGNOSIS — I69351 Hemiplegia and hemiparesis following cerebral infarction affecting right dominant side: Secondary | ICD-10-CM | POA: Diagnosis not present

## 2021-04-27 DIAGNOSIS — E1149 Type 2 diabetes mellitus with other diabetic neurological complication: Secondary | ICD-10-CM | POA: Diagnosis not present

## 2021-04-27 DIAGNOSIS — R1312 Dysphagia, oropharyngeal phase: Secondary | ICD-10-CM | POA: Diagnosis not present

## 2021-04-27 LAB — HEPATITIS C ANTIBODY: HCV Ab: NONREACTIVE

## 2021-04-28 DIAGNOSIS — R1312 Dysphagia, oropharyngeal phase: Secondary | ICD-10-CM | POA: Diagnosis not present

## 2021-04-28 DIAGNOSIS — I69351 Hemiplegia and hemiparesis following cerebral infarction affecting right dominant side: Secondary | ICD-10-CM | POA: Diagnosis not present

## 2021-04-28 LAB — MICROALBUMIN, URINE: Microalb, Ur: 7.2 ug/mL — ABNORMAL HIGH

## 2021-04-29 ENCOUNTER — Encounter: Payer: Self-pay | Admitting: Adult Health

## 2021-04-29 ENCOUNTER — Non-Acute Institutional Stay (SKILLED_NURSING_FACILITY): Payer: Medicare Other | Admitting: Adult Health

## 2021-04-29 DIAGNOSIS — E1169 Type 2 diabetes mellitus with other specified complication: Secondary | ICD-10-CM

## 2021-04-29 DIAGNOSIS — E441 Mild protein-calorie malnutrition: Secondary | ICD-10-CM

## 2021-04-29 DIAGNOSIS — R1312 Dysphagia, oropharyngeal phase: Secondary | ICD-10-CM | POA: Diagnosis not present

## 2021-04-29 DIAGNOSIS — E785 Hyperlipidemia, unspecified: Secondary | ICD-10-CM

## 2021-04-29 DIAGNOSIS — E1159 Type 2 diabetes mellitus with other circulatory complications: Secondary | ICD-10-CM

## 2021-04-29 DIAGNOSIS — I69351 Hemiplegia and hemiparesis following cerebral infarction affecting right dominant side: Secondary | ICD-10-CM | POA: Diagnosis not present

## 2021-04-29 DIAGNOSIS — I152 Hypertension secondary to endocrine disorders: Secondary | ICD-10-CM | POA: Diagnosis not present

## 2021-04-29 NOTE — Progress Notes (Signed)
Location:  [facility] Room Number: 111 Place of Service:  SNF ([id number])   CODE STATUS: dnr   No Known Allergies  Chief Complaint  Patient presents with   Medical Management of Chronic Issues          Mild protein calorie malnutrition: Hyperlipidemia associated with type 2 diabetes mellitus:  Hypertension associated with type 2 diabetes mellitus:     HPI:  He is a 75 year old long term resident of this facility being seen for the management of her chronic illnesses: Mild protein calorie malnutrition: Hyperlipidemia associated with type 2 diabetes mellitus:  Hypertension associated with type 2 diabetes mellitus. There are no reports of uncontrolled pain. No reports of anxiety or depressive thoughts.   Past Medical History:  Diagnosis Date   Hypertension    Benign Essential   Hypothyroidism    Obesity    Osteoarthritis    PVD (peripheral vascular disease) (HCC)    Type 2 diabetes mellitus (HCC)     Past Surgical History:  Procedure Laterality Date   APPENDECTOMY     TONSILLECTOMY      Social History   Socioeconomic History   Marital status: Single    Spouse name: Not on file   Number of children: Not on file   Years of education: 12   Highest education level: Not on file  Occupational History   Occupation: RETIRED  Tobacco Use   Smoking status: Former   Smokeless tobacco: Never  Building services engineer Use: Never used  Substance and Sexual Activity   Alcohol use: Not Currently   Drug use: Never   Sexual activity: Not Currently  Other Topics Concern   Not on file  Social History Narrative   Not on file   Social Determinants of Health   Financial Resource Strain: Not on file  Food Insecurity: Not on file  Transportation Needs: Not on file  Physical Activity: Not on file  Stress: Not on file  Social Connections: Not on file  Intimate Partner Violence: Not on file   Family History  Problem Relation Age of Onset   Osteoporosis Mother     Hypertension Mother    Cancer Mother    Heart attack Father    Heart attack Maternal Grandmother    Pneumonia Maternal Grandfather    Heart disease Paternal Grandmother    Heart disease Paternal Grandfather       VITAL SIGNS BP 121/70   Pulse 78   Temp 98 F (36.7 C)   Ht 5\' 11"  (1.803 m)   Wt 147 lb 6.4 oz (66.9 kg)   BMI 20.56 kg/m   Outpatient Encounter Medications as of 04/29/2021  Medication Sig   atorvastatin (LIPITOR) 40 MG tablet Take 40 mg by mouth daily.   ondansetron (ZOFRAN-ODT) 8 MG disintegrating tablet Take 8 mg by mouth 4 (four) times daily -  before meals and at bedtime.   aspirin EC 81 MG tablet Take 1 tablet (81 mg total) by mouth daily. Swallow whole.   clopidogrel (PLAVIX) 75 MG tablet Take 75 mg by mouth daily.   feeding supplement, GLUCERNA SHAKE, (GLUCERNA SHAKE) LIQD Take 237 mLs by mouth in the morning and at bedtime. 9 am and 9 pm   levothyroxine (SYNTHROID) 75 MCG tablet Take 75 mcg by mouth daily before breakfast.   memantine (NAMENDA XR) 7 MG CP24 24 hr capsule Take 7 mg by mouth daily.   miconazole (ZEASORB-AF) 2 % powder Apply  1 application topically as needed for itching (to groin and scrotum area substituted with shower to).   NON FORMULARY Diet Change: Dysphagia 3 diet with dysphagia 2 meats, thin liquids (NAS, ConCho)   omeprazole (PRILOSEC OTC) 20 MG tablet Take 20 mg by mouth at bedtime.   senna-docusate (SENOKOT-S) 8.6-50 MG tablet Take 2 tablets by mouth 2 (two) times daily. hold for diarrhea   sitaGLIPtin (JANUVIA) 50 MG tablet Take 50 mg by mouth daily.   triamcinolone cream (KENALOG) 0.1 % Apply 1 application topically 2 (two) times daily. Apply to all areas of psoriasis   [DISCONTINUED] atorvastatin (LIPITOR) 80 MG tablet Take 1 tablet (80 mg total) by mouth every evening.   [DISCONTINUED] ondansetron (ZOFRAN-ODT) 8 MG disintegrating tablet Take 8 mg by mouth every 6 (six) hours as needed for nausea.   No facility-administered  encounter medications on file as of 04/29/2021.     SIGNIFICANT DIAGNOSTIC EXAMS   PREVIOUS  01-18-21: ct of head:  Brain: Mild chronic ischemic white matter disease is noted. Mild diffuse cortical atrophy is noted. No mass effect or midline shift is noted. Ventricular size is within normal limits. There is no evidence of mass lesion, hemorrhage or acute infarction. Vascular: No hyperdense vessel or unexpected calcification. Skull: Normal. Negative for fracture or focal lesion. Sinuses/Orbits: No acute finding.  01-18-21: MRI/MRA of brain MRI: 1. Acute infarct in the posterolateral right medulla, likely right PICA territory. Additional acute right PCA territory infarcts in the right occipital lobe and posterior right corpus callosum. Associated mild edema without mass effect. 2. Developing encephalomalacia in the areas of previously seen infarct in the inferior right cerebellum. 3. Remote lacunar infarcts in the left thalamus, right caudate, and bilateral corona radiata. 4. Moderate chronic microvascular disease. MRA: 1. Severe stenosis of bilateral P2 PCAs with poor flow related signal in the more distal PCAs. Also, severe stenosis of the distal left M1 MCA, bilateral M2 MCA branches and bilateral A2 ACAs. 2. Similar multifocal moderate narrowing of the distal right intradural vertebral artery. Similar flow related signal in the proximal right PICA, but not the more distal right PICA.  01-19-21: 2-d echo:  1. Left ventricular ejection fraction, by estimation, is 70 to 75%. The  left ventricle has hyperdynamic function. The left ventricle has no  regional wall motion abnormalities. There is mild left ventricular  hypertrophy. Left ventricular diastolic  parameters are indeterminate.   01-28-21: chest x-ray:  1.  Prior CABG.  Heart size normal. 2. Mild left base subsegmental atelectasis. Tiny left pleural effusion cannot be excluded.  3. Right posterior eighth rib fracture. This may be  acute. No pneumothorax.  01-28-21: KUB 1. Nonobstructive bowel gas pattern. 2. Prominent colonic stool volume. Imaging appearance raises the question of chronic constipation.  01-28-21: EKG Sinus rhythm with first degree AV block; PAC: R BBB  NO NEW EXAMS.   LABS REVIEWED: PREVIOUS   01-18-21: wbc 13.5; hgb 13.2; hct 40.5; mcv 96.4 plt 377; glucose 260; bun 22; creat 1.19; k+ 4.9; na++ 137; ca 9.6 GFR>60; alk phos 161;  albumin 3.6; urine culture: staphylococcus aureus 01-20-21: wbc 11.6; hgb 12.8; hct 39.0; mcv 96.5 plt 333; glucose 207; bun 26; creat 1.22; k+ 5.1; na++ 139; ca 9.2 GFR>60; hgb a1c 8.3; chol 217; LDL 150; trig 151; hdl 37 8-86-85: wbc 13.5; hgb 13.6; hct 41.6; mcv 95.9 plt 390; glucose 243; bun 31;creat 1.28; k+ 5.1; na++ 137; ca 9.5 GFR 59; alk phos 134; albumin 3.2 mag 1.8  01-27-21: wbc 20.8; hgb 14.6; hct 43.3; mcv 92.7 pt 468; glucose 414; bun 41; creat 1.58; k+ 5.6; na++ 131; ca 9.5; GFR 45; alk phos 142; albumin 3.4 troponin #1: 10; #2: 13 01-28-21: wbc 17.3; hgb 13.5; hct 40.4; mcv 94.0 plt 437; glucose 292; bun 42; creat 1.39; k+ 4.9; na++ 130; ca 9.2 GFR 52; troponin #3 12. Mag 1.8   TODAY  01-29-21: guaiac positive 02-02-21: guaiac negative 02-05-21: wbc 11.4; hgb 12.9; hct 39.6; mcv 97.3 plt 397; glucose 194; bun 17; creat 1.13; k+ 4.9; na++ 137; ca 8.4; GFR>60 03-12-21: wbc 9.9; hgb 13.1; hct 39.8; mcv 95.4 plt 296; glucose 147; bun 23; creat 1.25; k+ 4.5; na++ 140; ca 9.0; GFR 59; liver normal albumin 3.3 03-17-21: tsh 2.566 04-08-21: chol 125; ldl 58; trig 213; hdl 24; hgb a1c 6.1; liver normal albumin 3.5;  04-27-21: urine micro albumin 7.2   Review of Systems  Constitutional:  Negative for malaise/fatigue.  Respiratory:  Negative for cough and shortness of breath.   Cardiovascular:  Negative for chest pain, palpitations and leg swelling.  Gastrointestinal:  Negative for abdominal pain, constipation and heartburn.  Musculoskeletal:  Negative for back pain, joint pain  and myalgias.  Skin: Negative.   Neurological:  Negative for dizziness.  Psychiatric/Behavioral:  The patient is not nervous/anxious.    Physical Exam Constitutional:      General: He is not in acute distress.    Appearance: He is well-developed. He is not diaphoretic.  Neck:     Thyroid: No thyromegaly.  Cardiovascular:     Rate and Rhythm: Normal rate and regular rhythm.     Pulses: Normal pulses.     Heart sounds: Normal heart sounds.  Pulmonary:     Effort: Pulmonary effort is normal. No respiratory distress.     Breath sounds: Normal breath sounds.  Abdominal:     General: Bowel sounds are normal. There is no distension.     Palpations: Abdomen is soft.     Tenderness: There is no abdominal tenderness.  Musculoskeletal:     Cervical back: Neck supple.     Right lower leg: No edema.     Left lower leg: No edema.     Comments: Left side weakness   Lymphadenopathy:     Cervical: No cervical adenopathy.  Skin:    General: Skin is warm and dry.  Neurological:     Mental Status: He is alert. Mental status is at baseline.  Psychiatric:        Mood and Affect: Mood normal.     ASSESSMENT/ PLAN:  TODAY  Mild protein calorie malnutrition: is stable albumin 3.2 will continue supplements as directed  2. Hyperlipidemia associated with type 2 diabetes mellitus: LDL 58 will continue lipitor 40 mg daily   3. Hypertension associated with type 2 diabetes mellitus: is stable b/p 120/70 will continue cozaar 12.5 mg daily   PREVIOUS   4. GERD without esophagitis: is stable will continue prilosec 20 mg daily and zofran 8 mg AC/HS   5. Chronic constipation: is stable: will continue  senna s 2 tabs twice daily    6. Acute stroke due to ischemia: is stable will continue therapy as directed: will continue asa 81 mg daily has completed plavix  7. Thyroid disease: is stable tsh 2.566 will continue synthroid 75 mcg daily   8. Type 2 diabetes mellitus with neurological  manifestations: is stable hgb a1c 6.1 will continue  januvia 25 mg daily  Ok Edwards NP [facility]  Contact [phone] Monday through Friday 8am- 5pm  After hours call [phone]

## 2021-04-30 DIAGNOSIS — R1312 Dysphagia, oropharyngeal phase: Secondary | ICD-10-CM | POA: Diagnosis not present

## 2021-04-30 DIAGNOSIS — I69351 Hemiplegia and hemiparesis following cerebral infarction affecting right dominant side: Secondary | ICD-10-CM | POA: Diagnosis not present

## 2021-05-02 DIAGNOSIS — I69351 Hemiplegia and hemiparesis following cerebral infarction affecting right dominant side: Secondary | ICD-10-CM | POA: Diagnosis not present

## 2021-05-02 DIAGNOSIS — R1312 Dysphagia, oropharyngeal phase: Secondary | ICD-10-CM | POA: Diagnosis not present

## 2021-05-03 DIAGNOSIS — R1312 Dysphagia, oropharyngeal phase: Secondary | ICD-10-CM | POA: Diagnosis not present

## 2021-05-03 DIAGNOSIS — I69351 Hemiplegia and hemiparesis following cerebral infarction affecting right dominant side: Secondary | ICD-10-CM | POA: Diagnosis not present

## 2021-05-05 DIAGNOSIS — R1312 Dysphagia, oropharyngeal phase: Secondary | ICD-10-CM | POA: Diagnosis not present

## 2021-05-05 DIAGNOSIS — I69351 Hemiplegia and hemiparesis following cerebral infarction affecting right dominant side: Secondary | ICD-10-CM | POA: Diagnosis not present

## 2021-05-24 ENCOUNTER — Encounter: Payer: Self-pay | Admitting: Adult Health

## 2021-05-24 DIAGNOSIS — F01C0 Vascular dementia, severe, without behavioral disturbance, psychotic disturbance, mood disturbance, and anxiety: Secondary | ICD-10-CM | POA: Insufficient documentation

## 2021-05-24 DIAGNOSIS — F0150 Vascular dementia without behavioral disturbance: Secondary | ICD-10-CM | POA: Insufficient documentation

## 2021-05-27 ENCOUNTER — Encounter: Payer: Self-pay | Admitting: Adult Health

## 2021-05-27 ENCOUNTER — Non-Acute Institutional Stay (SKILLED_NURSING_FACILITY): Payer: Medicare Other | Admitting: Adult Health

## 2021-05-27 DIAGNOSIS — K219 Gastro-esophageal reflux disease without esophagitis: Secondary | ICD-10-CM | POA: Diagnosis not present

## 2021-05-27 DIAGNOSIS — Z66 Do not resuscitate: Secondary | ICD-10-CM | POA: Diagnosis not present

## 2021-05-27 DIAGNOSIS — R279 Unspecified lack of coordination: Secondary | ICD-10-CM | POA: Diagnosis not present

## 2021-05-27 DIAGNOSIS — M6281 Muscle weakness (generalized): Secondary | ICD-10-CM | POA: Diagnosis not present

## 2021-05-27 DIAGNOSIS — K5909 Other constipation: Secondary | ICD-10-CM

## 2021-05-27 DIAGNOSIS — Z9181 History of falling: Secondary | ICD-10-CM | POA: Diagnosis not present

## 2021-05-27 DIAGNOSIS — I69351 Hemiplegia and hemiparesis following cerebral infarction affecting right dominant side: Secondary | ICD-10-CM | POA: Diagnosis not present

## 2021-05-27 NOTE — Progress Notes (Signed)
Location:  [place] Room Number: 111-W Place of Service:  SNF (31)   CODE STATUS: DNR  No Known Allergies  Chief Complaint  Patient presents with   Medical Management of Chronic Issues              GERD without esophagitis: Chronic constipation:  Stroke    HPI:  He is a 75 year old long term resident of this facility being seen for the management of his chronic illnesses: GERD without esophagitis: Chronic constipation:  Stroke. There no reports of uncontrolled pain. There are no reports of anxiety of depressive thoughts.   Past Medical History:  Diagnosis Date   Hypertension    Benign Essential   Hypothyroidism    Obesity    Osteoarthritis    PVD (peripheral vascular disease) ([place])    Type 2 diabetes mellitus ([place])     Past Surgical History:  Procedure Laterality Date   APPENDECTOMY     TONSILLECTOMY      Social History   Socioeconomic History   Marital status: Single    Spouse name: Not on file   Number of children: Not on file   Years of education: 12   Highest education level: Not on file  Occupational History   Occupation: RETIRED  Tobacco Use   Smoking status: Former   Smokeless tobacco: Never  Scientific laboratory technician Use: Never used  Substance and Sexual Activity   Alcohol use: Not Currently   Drug use: Never   Sexual activity: Not Currently  Other Topics Concern   Not on file  Social History Narrative   Not on file   Social Determinants of Health   Financial Resource Strain: Not on file  Food Insecurity: Not on file  Transportation Needs: Not on file  Physical Activity: Not on file  Stress: Not on file  Social Connections: Not on file  Intimate Partner Violence: Not on file   Family History  Problem Relation Age of Onset   Osteoporosis Mother    Hypertension Mother    Cancer Mother    Heart attack Father    Heart attack Maternal Grandmother    Pneumonia Maternal Grandfather    Heart disease Paternal Grandmother     Heart disease Paternal Grandfather       VITAL SIGNS BP 135/67   Pulse 68   Temp (!) 97.2 F (36.2 C)   Resp 20   Ht 5' 11"  (1.803 m)   Wt 146 lb 14.4 oz (66.6 kg)   SpO2 96%   BMI 20.49 kg/m   Outpatient Encounter Medications as of 05/27/2021  Medication Sig   aspirin EC 81 MG tablet Take 162 mg by mouth daily. Swallow whole.   atorvastatin (LIPITOR) 40 MG tablet Take 40 mg by mouth daily.   clopidogrel (PLAVIX) 75 MG tablet Take 75 mg by mouth daily.   feeding supplement, GLUCERNA SHAKE, (GLUCERNA SHAKE) LIQD Take 237 mLs by mouth in the morning and at bedtime. 9 am and 9 pm   levothyroxine (SYNTHROID) 75 MCG tablet Take 75 mcg by mouth daily before breakfast.   memantine (NAMENDA XR) 28 MG CP24 24 hr capsule Take 28 mg by mouth daily. 9 am   miconazole (ZEASORB-AF) 2 % powder Apply 1 application topically as needed for itching (to groin and scrotum area substituted with shower to).   NON FORMULARY Diet Change: Dysphagia 3 diet with dysphagia 2 meats, thin liquids (NAS, ConCho)   omeprazole (PRILOSEC OTC)  20 MG tablet Take 20 mg by mouth at bedtime.   ondansetron (ZOFRAN-ODT) 8 MG disintegrating tablet Take 8 mg by mouth daily as needed.   senna-docusate (SENOKOT-S) 8.6-50 MG tablet Take 2 tablets by mouth 2 (two) times daily as needed. hold for diarrhea   sitaGLIPtin (JANUVIA) 50 MG tablet Take 50 mg by mouth daily.   triamcinolone cream (KENALOG) 0.1 % Apply 1 application topically 2 (two) times daily. Apply to all areas of psoriasis   [DISCONTINUED] aspirin EC 81 MG tablet Take 1 tablet (81 mg total) by mouth daily. Swallow whole.   [DISCONTINUED] memantine (NAMENDA XR) 7 MG CP24 24 hr capsule Take 7 mg by mouth daily.   No facility-administered encounter medications on file as of 05/27/2021.     SIGNIFICANT DIAGNOSTIC EXAMS  PREVIOUS  01-18-21: ct of head:  Brain: Mild chronic ischemic white matter disease is noted. Mild diffuse cortical atrophy is noted. No mass  effect or midline shift is noted. Ventricular size is within normal limits. There is no evidence of mass lesion, hemorrhage or acute infarction. Vascular: No hyperdense vessel or unexpected calcification. Skull: Normal. Negative for fracture or focal lesion. Sinuses/Orbits: No acute finding.  01-18-21: MRI/MRA of brain MRI: 1. Acute infarct in the posterolateral right medulla, likely right PICA territory. Additional acute right PCA territory infarcts in the right occipital lobe and posterior right corpus callosum. Associated mild edema without mass effect. 2. Developing encephalomalacia in the areas of previously seen infarct in the inferior right cerebellum. 3. Remote lacunar infarcts in the left thalamus, right caudate, and bilateral corona radiata. 4. Moderate chronic microvascular disease. MRA: 1. Severe stenosis of bilateral P2 PCAs with poor flow related signal in the more distal PCAs. Also, severe stenosis of the distal left M1 MCA, bilateral M2 MCA branches and bilateral A2 ACAs. 2. Similar multifocal moderate narrowing of the distal right intradural vertebral artery. Similar flow related signal in the proximal right PICA, but not the more distal right PICA.  01-19-21: 2-d echo:  1. Left ventricular ejection fraction, by estimation, is 70 to 75%. The  left ventricle has hyperdynamic function. The left ventricle has no  regional wall motion abnormalities. There is mild left ventricular  hypertrophy. Left ventricular diastolic  parameters are indeterminate.   01-28-21: chest x-ray:  1.  Prior CABG.  Heart size normal. 2. Mild left base subsegmental atelectasis. Tiny left pleural effusion cannot be excluded.  3. Right posterior eighth rib fracture. This may be acute. No pneumothorax.  01-28-21: KUB 1. Nonobstructive bowel gas pattern. 2. Prominent colonic stool volume. Imaging appearance raises the question of chronic constipation.  01-28-21: EKG Sinus rhythm with first degree AV block;  PAC: R BBB  NO NEW EXAMS.   LABS REVIEWED: PREVIOUS   01-18-21: wbc 13.5; hgb 13.2; hct 40.5; mcv 96.4 plt 377; glucose 260; bun 22; creat 1.19; k+ 4.9; na++ 137; ca 9.6 GFR>60; alk phos 161;  albumin 3.6; urine culture: staphylococcus aureus 01-20-21: wbc 11.6; hgb 12.8; hct 39.0; mcv 96.5 plt 333; glucose 207; bun 26; creat 1.22; k+ 5.1; na++ 139; ca 9.2 GFR>60; hgb a1c 8.3; chol 217; LDL 150; trig 151; hdl 37 01-21-21: wbc 13.5; hgb 13.6; hct 41.6; mcv 95.9 plt 390; glucose 243; bun 31;creat 1.28; k+ 5.1; na++ 137; ca 9.5 GFR 59; alk phos 134; albumin 3.2 mag 1.8   01-27-21: wbc 20.8; hgb 14.6; hct 43.3; mcv 92.7 pt 468; glucose 414; bun 41; creat 1.58; k+ 5.6; na++ 131; ca 9.5; GFR  45; alk phos 142; albumin 3.4 troponin #1: 10; #2: 13 01-28-21: wbc 17.3; hgb 13.5; hct 40.4; mcv 94.0 plt 437; glucose 292; bun 42; creat 1.39; k+ 4.9; na++ 130; ca 9.2 GFR 52; troponin #3 12. Mag 1.8  01-29-21: guaiac positive 02-02-21: guaiac negative 02-05-21: wbc 11.4; hgb 12.9; hct 39.6; mcv 97.3 plt 397; glucose 194; bun 17; creat 1.13; k+ 4.9; na++ 137; ca 8.4; GFR>60 03-12-21: wbc 9.9; hgb 13.1; hct 39.8; mcv 95.4 plt 296; glucose 147; bun 23; creat 1.25; k+ 4.5; na++ 140; ca 9.0; GFR 59; liver normal albumin 3.3 03-17-21: tsh 2.566 04-08-21: chol 125; ldl 58; trig 213; hdl 24; hgb a1c 6.1; liver normal albumin 3.5;  04-27-21: urine micro albumin 7.2   NO NEW LABS.   Review of Systems  Constitutional:  Negative for malaise/fatigue.  Respiratory:  Negative for cough and shortness of breath.   Cardiovascular:  Negative for chest pain, palpitations and leg swelling.  Gastrointestinal:  Negative for abdominal pain, constipation and heartburn.  Musculoskeletal:  Negative for back pain, joint pain and myalgias.  Skin: Negative.   Neurological:  Negative for dizziness.  Psychiatric/Behavioral:  The patient is not nervous/anxious.    Physical Exam Constitutional:      General: He is not in acute distress.     Appearance: He is well-developed. He is not diaphoretic.  Neck:     Thyroid: No thyromegaly.  Cardiovascular:     Rate and Rhythm: Normal rate and regular rhythm.     Pulses: Normal pulses.     Heart sounds: Normal heart sounds.  Pulmonary:     Effort: Pulmonary effort is normal. No respiratory distress.     Breath sounds: Normal breath sounds.  Abdominal:     General: Bowel sounds are normal. There is no distension.     Palpations: Abdomen is soft.     Tenderness: There is no abdominal tenderness.  Musculoskeletal:     Cervical back: Neck supple.     Right lower leg: No edema.     Left lower leg: No edema.     Comments: Is able to move all extremities   Lymphadenopathy:     Cervical: No cervical adenopathy.  Skin:    General: Skin is warm and dry.  Neurological:     Mental Status: He is alert. Mental status is at baseline.  Psychiatric:        Mood and Affect: Mood normal.     ASSESSMENT/ PLAN:  TODAY  GERD without esophagitis: is stable will continue prilosec 20 mg daily and zofran 8 mg ac/hs  2. Chronic constipation: is stable will continue senna s 2 tabs twice daily   3. Stroke: is stable will continue asa 81 mg daily has completed plavix    PREVIOUS   4. Thyroid disease: is stable tsh 2.566 will continue synthroid 75 mcg daily   5. Type 2 diabetes mellitus with neurological manifestations: is stable hgb a1c 6.1 will continue  januvia 25 mg daily   6. Mild protein calorie malnutrition: is stable albumin 3.2 will continue supplements as directed  7. Hyperlipidemia associated with type 2 diabetes mellitus: LDL 58 will continue lipitor 40 mg daily   8. Hypertension associated with type 2 diabetes mellitus: is stable b/p 135/67 will continue cozaar 12.5 mg daily     Ok Edwards NP [facility]  Contact [phone] Monday through Friday 8am- 5pm  After hours call [phone]

## 2021-05-28 DIAGNOSIS — M6281 Muscle weakness (generalized): Secondary | ICD-10-CM | POA: Diagnosis not present

## 2021-05-28 DIAGNOSIS — Z9181 History of falling: Secondary | ICD-10-CM | POA: Diagnosis not present

## 2021-05-28 DIAGNOSIS — I69351 Hemiplegia and hemiparesis following cerebral infarction affecting right dominant side: Secondary | ICD-10-CM | POA: Diagnosis not present

## 2021-05-28 DIAGNOSIS — R279 Unspecified lack of coordination: Secondary | ICD-10-CM | POA: Diagnosis not present

## 2021-05-31 DIAGNOSIS — Z9181 History of falling: Secondary | ICD-10-CM | POA: Diagnosis not present

## 2021-05-31 DIAGNOSIS — I69351 Hemiplegia and hemiparesis following cerebral infarction affecting right dominant side: Secondary | ICD-10-CM | POA: Diagnosis not present

## 2021-05-31 DIAGNOSIS — L603 Nail dystrophy: Secondary | ICD-10-CM | POA: Diagnosis not present

## 2021-05-31 DIAGNOSIS — M24571 Contracture, right ankle: Secondary | ICD-10-CM | POA: Diagnosis not present

## 2021-05-31 DIAGNOSIS — M6281 Muscle weakness (generalized): Secondary | ICD-10-CM | POA: Diagnosis not present

## 2021-05-31 DIAGNOSIS — R279 Unspecified lack of coordination: Secondary | ICD-10-CM | POA: Diagnosis not present

## 2021-05-31 DIAGNOSIS — I739 Peripheral vascular disease, unspecified: Secondary | ICD-10-CM | POA: Diagnosis not present

## 2021-05-31 DIAGNOSIS — M24572 Contracture, left ankle: Secondary | ICD-10-CM | POA: Diagnosis not present

## 2021-05-31 DIAGNOSIS — L602 Onychogryphosis: Secondary | ICD-10-CM | POA: Diagnosis not present

## 2021-06-01 DIAGNOSIS — Z9181 History of falling: Secondary | ICD-10-CM | POA: Diagnosis not present

## 2021-06-01 DIAGNOSIS — I69351 Hemiplegia and hemiparesis following cerebral infarction affecting right dominant side: Secondary | ICD-10-CM | POA: Diagnosis not present

## 2021-06-01 DIAGNOSIS — R279 Unspecified lack of coordination: Secondary | ICD-10-CM | POA: Diagnosis not present

## 2021-06-01 DIAGNOSIS — M6281 Muscle weakness (generalized): Secondary | ICD-10-CM | POA: Diagnosis not present

## 2021-06-02 DIAGNOSIS — H35341 Macular cyst, hole, or pseudohole, right eye: Secondary | ICD-10-CM | POA: Diagnosis not present

## 2021-06-02 DIAGNOSIS — H524 Presbyopia: Secondary | ICD-10-CM | POA: Diagnosis not present

## 2021-06-02 DIAGNOSIS — Z961 Presence of intraocular lens: Secondary | ICD-10-CM | POA: Diagnosis not present

## 2021-06-02 DIAGNOSIS — I69351 Hemiplegia and hemiparesis following cerebral infarction affecting right dominant side: Secondary | ICD-10-CM | POA: Diagnosis not present

## 2021-06-02 DIAGNOSIS — E113293 Type 2 diabetes mellitus with mild nonproliferative diabetic retinopathy without macular edema, bilateral: Secondary | ICD-10-CM | POA: Diagnosis not present

## 2021-06-02 DIAGNOSIS — Z9181 History of falling: Secondary | ICD-10-CM | POA: Diagnosis not present

## 2021-06-02 DIAGNOSIS — M6281 Muscle weakness (generalized): Secondary | ICD-10-CM | POA: Diagnosis not present

## 2021-06-02 DIAGNOSIS — R279 Unspecified lack of coordination: Secondary | ICD-10-CM | POA: Diagnosis not present

## 2021-06-03 ENCOUNTER — Encounter: Payer: Self-pay | Admitting: Adult Health

## 2021-06-03 DIAGNOSIS — I69351 Hemiplegia and hemiparesis following cerebral infarction affecting right dominant side: Secondary | ICD-10-CM | POA: Diagnosis not present

## 2021-06-03 DIAGNOSIS — M6281 Muscle weakness (generalized): Secondary | ICD-10-CM | POA: Diagnosis not present

## 2021-06-03 DIAGNOSIS — R279 Unspecified lack of coordination: Secondary | ICD-10-CM | POA: Diagnosis not present

## 2021-06-03 DIAGNOSIS — Z9181 History of falling: Secondary | ICD-10-CM | POA: Diagnosis not present

## 2021-06-03 NOTE — Progress Notes (Signed)
Location:  [facility] Room Number: 111 Place of Service:  SNF (31)   CODE STATUS:   No Known Allergies  Chief Complaint  Patient presents with   Acute Visit    Weight loss    Error    HPI:    Past Medical History:  Diagnosis Date   Hypertension    Benign Essential   Hypothyroidism    Obesity    Osteoarthritis    PVD (peripheral vascular disease) ([facility])    Type 2 diabetes mellitus ([facility])     Past Surgical History:  Procedure Laterality Date   APPENDECTOMY     TONSILLECTOMY      Social History   Socioeconomic History   Marital status: Single    Spouse name: Not on file   Number of children: Not on file   Years of education: 12   Highest education level: Not on file  Occupational History   Occupation: RETIRED  Tobacco Use   Smoking status: Former   Smokeless tobacco: Never  Building services engineer Use: Never used  Substance and Sexual Activity   Alcohol use: Not Currently   Drug use: Never   Sexual activity: Not Currently  Other Topics Concern   Not on file  Social History Narrative   Not on file   Social Determinants of Health   Financial Resource Strain: Not on file  Food Insecurity: Not on file  Transportation Needs: Not on file  Physical Activity: Not on file  Stress: Not on file  Social Connections: Not on file  Intimate Partner Violence: Not on file   Family History  Problem Relation Age of Onset   Osteoporosis Mother    Hypertension Mother    Cancer Mother    Heart attack Father    Heart attack Maternal Grandmother    Pneumonia Maternal Grandfather    Heart disease Paternal Grandmother    Heart disease Paternal Grandfather       VITAL SIGNS BP (!) 103/57   Pulse 86   Temp 98.1 F (36.7 C)   Ht 5\' 11"  (1.803 m)   Wt 139 lb 6.4 oz (63.2 kg)   BMI 19.44 kg/m   Outpatient Encounter Medications as of 06/03/2021  Medication Sig   aspirin EC 81 MG tablet Take 162 mg by mouth daily. Swallow whole.    atorvastatin (LIPITOR) 40 MG tablet Take 40 mg by mouth daily.   feeding supplement, GLUCERNA SHAKE, (GLUCERNA SHAKE) LIQD Take 237 mLs by mouth in the morning and at bedtime. 9 am and 9 pm   levothyroxine (SYNTHROID) 75 MCG tablet Take 75 mcg by mouth daily before breakfast.   memantine (NAMENDA XR) 28 MG CP24 24 hr capsule Take 28 mg by mouth daily. 9 am   miconazole (ZEASORB-AF) 2 % powder Apply 1 application topically as needed for itching (to groin and scrotum area substituted with shower to).   NON FORMULARY Diet Change: Dysphagia 3 diet with dysphagia 2 meats, thin liquids (NAS, ConCho)   omeprazole (PRILOSEC OTC) 20 MG tablet Take 20 mg by mouth at bedtime.   ondansetron (ZOFRAN-ODT) 8 MG disintegrating tablet Take 8 mg by mouth daily as needed.   senna-docusate (SENOKOT-S) 8.6-50 MG tablet Take 2 tablets by mouth 2 (two) times daily as needed. hold for diarrhea   sitaGLIPtin (JANUVIA) 50 MG tablet Take 50 mg by mouth daily.   triamcinolone cream (KENALOG) 0.1 % Apply 1 application topically 2 (two) times daily. Apply to  all areas of psoriasis   No facility-administered encounter medications on file as of 06/03/2021.     SIGNIFICANT DIAGNOSTIC EXAMS       ASSESSMENT/ PLAN:     Synthia Innocent NP [facility]  Contact [phone] Monday through Friday 8am- 5pm  After hours call [phone]   This encounter was created in error - please disregard.

## 2021-06-09 DIAGNOSIS — M6281 Muscle weakness (generalized): Secondary | ICD-10-CM | POA: Diagnosis not present

## 2021-06-09 DIAGNOSIS — R279 Unspecified lack of coordination: Secondary | ICD-10-CM | POA: Diagnosis not present

## 2021-06-09 DIAGNOSIS — I69351 Hemiplegia and hemiparesis following cerebral infarction affecting right dominant side: Secondary | ICD-10-CM | POA: Diagnosis not present

## 2021-06-09 DIAGNOSIS — Z9181 History of falling: Secondary | ICD-10-CM | POA: Diagnosis not present

## 2021-06-10 DIAGNOSIS — Z9181 History of falling: Secondary | ICD-10-CM | POA: Diagnosis not present

## 2021-06-10 DIAGNOSIS — I69351 Hemiplegia and hemiparesis following cerebral infarction affecting right dominant side: Secondary | ICD-10-CM | POA: Diagnosis not present

## 2021-06-10 DIAGNOSIS — R279 Unspecified lack of coordination: Secondary | ICD-10-CM | POA: Diagnosis not present

## 2021-06-10 DIAGNOSIS — M6281 Muscle weakness (generalized): Secondary | ICD-10-CM | POA: Diagnosis not present

## 2021-06-11 ENCOUNTER — Non-Acute Institutional Stay (SKILLED_NURSING_FACILITY): Payer: Medicare Other | Admitting: Adult Health

## 2021-06-11 ENCOUNTER — Encounter: Payer: Self-pay | Admitting: Adult Health

## 2021-06-11 DIAGNOSIS — F01C0 Vascular dementia, severe, without behavioral disturbance, psychotic disturbance, mood disturbance, and anxiety: Secondary | ICD-10-CM

## 2021-06-11 DIAGNOSIS — I700 Atherosclerosis of aorta: Secondary | ICD-10-CM

## 2021-06-11 DIAGNOSIS — N1831 Chronic kidney disease, stage 3a: Secondary | ICD-10-CM | POA: Diagnosis not present

## 2021-06-11 NOTE — Progress Notes (Signed)
Location:  [place] Room Number: 111-W Place of Service:  SNF (31)   CODE STATUS: DNR  No Known Allergies  Chief Complaint  Patient presents with   Acute Visit    Care plan meeting    HPI:  We have come together for his care plan meeting. Family present  no BIMS mood 5/30: decreased interest trouble concentrating; some depression. He does get agitated at times. He hs extensive assist to dependent with his adls. He is incontinent of bladder and bowel. Nonambulatory one fall without injury. Dietary:  regular diet appetite is good; weight is 139.4 down 5 % over the past month assist with meals; is getting supplements. Therapy: dependent for transfers "torture" to sit up chair.  he continues to be followed for his chronic illnesses including:   Aortic atherosclerosis Severe vascular dementia without behavioral disturbance psychotic disturbance mood disturbance or anxiety  Stage 3a chronic kidney disease  Past Medical History:  Diagnosis Date   Hypertension    Benign Essential   Hypothyroidism    Obesity    Osteoarthritis    PVD (peripheral vascular disease) ([place])    Type 2 diabetes mellitus ([facility])     Past Surgical History:  Procedure Laterality Date   APPENDECTOMY     TONSILLECTOMY      Social History   Socioeconomic History   Marital status: Single    Spouse name: Not on file   Number of children: Not on file   Years of education: 12   Highest education level: Not on file  Occupational History   Occupation: RETIRED  Tobacco Use   Smoking status: Former   Smokeless tobacco: Never  Scientific laboratory technician Use: Never used  Substance and Sexual Activity   Alcohol use: Not Currently   Drug use: Never   Sexual activity: Not Currently  Other Topics Concern   Not on file  Social History Narrative   Not on file   Social Determinants of Health   Financial Resource Strain: Not on file  Food Insecurity: Not on file  Transportation Needs: Not on  file  Physical Activity: Not on file  Stress: Not on file  Social Connections: Not on file  Intimate Partner Violence: Not on file   Family History  Problem Relation Age of Onset   Osteoporosis Mother    Hypertension Mother    Cancer Mother    Heart attack Father    Heart attack Maternal Grandmother    Pneumonia Maternal Grandfather    Heart disease Paternal Grandmother    Heart disease Paternal Grandfather       VITAL SIGNS BP 107/64    Pulse 68    Temp (!) 97.2 F (36.2 C)    Resp 20    Ht 5' 11"  (1.803 m)    Wt 146 lb 14.4 oz (66.6 kg)    SpO2 96%    BMI 20.49 kg/m   Outpatient Encounter Medications as of 06/11/2021  Medication Sig   aspirin EC 81 MG tablet Take 162 mg by mouth daily. Swallow whole.   atorvastatin (LIPITOR) 40 MG tablet Take 40 mg by mouth daily.   feeding supplement, GLUCERNA SHAKE, (GLUCERNA SHAKE) LIQD Take 237 mLs by mouth in the morning and at bedtime. 9 am and 9 pm   levothyroxine (SYNTHROID) 75 MCG tablet Take 75 mcg by mouth daily before breakfast.   memantine (NAMENDA XR) 28 MG CP24 24 hr capsule Take 28 mg by mouth daily. 9  am   miconazole (ZEASORB-AF) 2 % powder Apply 1 application topically as needed for itching (to groin and scrotum area substituted with shower to).   NON FORMULARY Diet Change: Dysphagia 3 diet with dysphagia 2 meats, thin liquids (NAS, ConCho)   omeprazole (PRILOSEC OTC) 20 MG tablet Take 20 mg by mouth at bedtime.   ondansetron (ZOFRAN-ODT) 8 MG disintegrating tablet Take 8 mg by mouth daily as needed.   senna-docusate (SENOKOT-S) 8.6-50 MG tablet Take 2 tablets by mouth 2 (two) times daily as needed. hold for diarrhea   sitaGLIPtin (JANUVIA) 50 MG tablet Take 50 mg by mouth daily.   triamcinolone cream (KENALOG) 0.1 % Apply 1 application topically 2 (two) times daily. Apply to all areas of psoriasis   No facility-administered encounter medications on file as of 06/11/2021.     SIGNIFICANT DIAGNOSTIC  EXAMS  PREVIOUS  01-18-21: ct of head:  Brain: Mild chronic ischemic white matter disease is noted. Mild diffuse cortical atrophy is noted. No mass effect or midline shift is noted. Ventricular size is within normal limits. There is no evidence of mass lesion, hemorrhage or acute infarction. Vascular: No hyperdense vessel or unexpected calcification. Skull: Normal. Negative for fracture or focal lesion. Sinuses/Orbits: No acute finding.  01-18-21: MRI/MRA of brain MRI: 1. Acute infarct in the posterolateral right medulla, likely right PICA territory. Additional acute right PCA territory infarcts in the right occipital lobe and posterior right corpus callosum. Associated mild edema without mass effect. 2. Developing encephalomalacia in the areas of previously seen infarct in the inferior right cerebellum. 3. Remote lacunar infarcts in the left thalamus, right caudate, and bilateral corona radiata. 4. Moderate chronic microvascular disease. MRA: 1. Severe stenosis of bilateral P2 PCAs with poor flow related signal in the more distal PCAs. Also, severe stenosis of the distal left M1 MCA, bilateral M2 MCA branches and bilateral A2 ACAs. 2. Similar multifocal moderate narrowing of the distal right intradural vertebral artery. Similar flow related signal in the proximal right PICA, but not the more distal right PICA.  01-19-21: 2-d echo:  1. Left ventricular ejection fraction, by estimation, is 70 to 75%. The  left ventricle has hyperdynamic function. The left ventricle has no  regional wall motion abnormalities. There is mild left ventricular  hypertrophy. Left ventricular diastolic  parameters are indeterminate.   01-28-21: chest x-ray:  1.  Prior CABG.  Heart size normal. 2. Mild left base subsegmental atelectasis. Tiny left pleural effusion cannot be excluded.  3. Right posterior eighth rib fracture. This may be acute. No pneumothorax.  01-28-21: KUB 1. Nonobstructive bowel gas pattern. 2.  Prominent colonic stool volume. Imaging appearance raises the question of chronic constipation.  01-28-21: EKG Sinus rhythm with first degree AV block; PAC: R BBB  NO NEW EXAMS.   LABS REVIEWED: PREVIOUS   01-18-21: wbc 13.5; hgb 13.2; hct 40.5; mcv 96.4 plt 377; glucose 260; bun 22; creat 1.19; k+ 4.9; na++ 137; ca 9.6 GFR>60; alk phos 161;  albumin 3.6; urine culture: staphylococcus aureus 01-20-21: wbc 11.6; hgb 12.8; hct 39.0; mcv 96.5 plt 333; glucose 207; bun 26; creat 1.22; k+ 5.1; na++ 139; ca 9.2 GFR>60; hgb a1c 8.3; chol 217; LDL 150; trig 151; hdl 37 01-21-21: wbc 13.5; hgb 13.6; hct 41.6; mcv 95.9 plt 390; glucose 243; bun 31;creat 1.28; k+ 5.1; na++ 137; ca 9.5 GFR 59; alk phos 134; albumin 3.2 mag 1.8   01-27-21: wbc 20.8; hgb 14.6; hct 43.3; mcv 92.7 pt 468; glucose 414; bun  41; creat 1.58; k+ 5.6; na++ 131; ca 9.5; GFR 45; alk phos 142; albumin 3.4 troponin #1: 10; #2: 13 01-28-21: wbc 17.3; hgb 13.5; hct 40.4; mcv 94.0 plt 437; glucose 292; bun 42; creat 1.39; k+ 4.9; na++ 130; ca 9.2 GFR 52; troponin #3 12. Mag 1.8  01-29-21: guaiac positive 02-02-21: guaiac negative 02-05-21: wbc 11.4; hgb 12.9; hct 39.6; mcv 97.3 plt 397; glucose 194; bun 17; creat 1.13; k+ 4.9; na++ 137; ca 8.4; GFR>60 03-12-21: wbc 9.9; hgb 13.1; hct 39.8; mcv 95.4 plt 296; glucose 147; bun 23; creat 1.25; k+ 4.5; na++ 140; ca 9.0; GFR 59; liver normal albumin 3.3 03-17-21: tsh 2.566 04-08-21: chol 125; ldl 58; trig 213; hdl 24; hgb a1c 6.1; liver normal albumin 3.5;  04-27-21: urine micro albumin 7.2   NO NEW LABS.    Review of Systems  Constitutional:  Negative for malaise/fatigue.  Respiratory:  Negative for cough and shortness of breath.   Cardiovascular:  Negative for chest pain, palpitations and leg swelling.  Gastrointestinal:  Negative for abdominal pain, constipation and heartburn.  Musculoskeletal:  Negative for back pain, joint pain and myalgias.  Skin: Negative.   Neurological:  Negative for  dizziness.  Psychiatric/Behavioral:  The patient is not nervous/anxious.    Physical Exam Constitutional:      General: He is not in acute distress.    Appearance: He is well-developed. He is not diaphoretic.  Neck:     Thyroid: No thyromegaly.  Cardiovascular:     Rate and Rhythm: Normal rate and regular rhythm.     Pulses: Normal pulses.     Heart sounds: Normal heart sounds.  Pulmonary:     Effort: Pulmonary effort is normal. No respiratory distress.     Breath sounds: Normal breath sounds.  Abdominal:     General: Bowel sounds are normal. There is no distension.     Palpations: Abdomen is soft.     Tenderness: There is no abdominal tenderness.  Musculoskeletal:        General: Normal range of motion.     Cervical back: Neck supple.  Lymphadenopathy:     Cervical: No cervical adenopathy.  Skin:    General: Skin is warm and dry.  Neurological:     Mental Status: He is alert. Mental status is at baseline.  Psychiatric:        Mood and Affect: Mood normal.      ASSESSMENT/ PLAN:  TODAY  Aortic atherosclerosis Severe vascular dementia without behavioral disturbance psychotic disturbance mood disturbance or anxiety Stage 3a chronic kidney disease  Will continue current medications Will continue current plan of care Will continue to monitor his status.  His family does not want a feeding tube    Time spent with patient: 40 minutes: medications; plan of care.    Ok Edwards NP [facility]  Contact [phone] Monday through Friday 8am- 5pm  After hours call [phone]

## 2021-07-01 DIAGNOSIS — I69351 Hemiplegia and hemiparesis following cerebral infarction affecting right dominant side: Secondary | ICD-10-CM | POA: Diagnosis not present

## 2021-07-01 DIAGNOSIS — Z1159 Encounter for screening for other viral diseases: Secondary | ICD-10-CM | POA: Diagnosis not present

## 2021-07-08 ENCOUNTER — Non-Acute Institutional Stay (SKILLED_NURSING_FACILITY): Payer: Medicare Other | Admitting: Internal Medicine

## 2021-07-08 ENCOUNTER — Encounter: Payer: Self-pay | Admitting: Internal Medicine

## 2021-07-08 DIAGNOSIS — E785 Hyperlipidemia, unspecified: Secondary | ICD-10-CM

## 2021-07-08 DIAGNOSIS — I700 Atherosclerosis of aorta: Secondary | ICD-10-CM

## 2021-07-08 DIAGNOSIS — E1169 Type 2 diabetes mellitus with other specified complication: Secondary | ICD-10-CM

## 2021-07-08 DIAGNOSIS — F01C0 Vascular dementia, severe, without behavioral disturbance, psychotic disturbance, mood disturbance, and anxiety: Secondary | ICD-10-CM

## 2021-07-08 DIAGNOSIS — I10 Essential (primary) hypertension: Secondary | ICD-10-CM | POA: Diagnosis not present

## 2021-07-08 DIAGNOSIS — E441 Mild protein-calorie malnutrition: Secondary | ICD-10-CM

## 2021-07-08 DIAGNOSIS — I69351 Hemiplegia and hemiparesis following cerebral infarction affecting right dominant side: Secondary | ICD-10-CM | POA: Diagnosis not present

## 2021-07-08 DIAGNOSIS — Z1159 Encounter for screening for other viral diseases: Secondary | ICD-10-CM | POA: Diagnosis not present

## 2021-07-08 NOTE — Progress Notes (Signed)
NURSING HOME LOCATION:  [facility] ROOM NUMBER:  111 W  CODE STATUS:  DNR  PCP:  Ok Edwards NP,PSC  This is a nursing facility follow up visit of chronic medical diagnoses & to document compliance with Regulation 483.30 (c) in [employer] [place] Manual Phase 2 which mandates caregiver visit ( visits can alternate among physician, PA or NP as per statutes) within 10 days of 30 days / 60 days/ 90 days post admission to SNF date    Interim medical record and care since last SNF visit was updated with review of diagnostic studies and change in clinical status since last visit were documented.  HPI: He is a permanent resident of this facility with medical diagnoses of essential hypertension, history of stroke, hypothyroidism, diabetes with PVD, psoriasis, and dementia.  Most recent labs were performed 9/16 and 04/08/2021.  Creatinine was 1.25 with a GFR of 59 indicating high stage IIIa CKD.  LDL was 58.  Diabetic control was excellent as evidenced by a A1c of 6.1%.  TSH was therapeutic at 2.566.  Review of systems: Dementia invalidated responses. Date given as "71 or 13, 2023".  Despite this he confabulated throughout the exam.  His focus was on  "sleepwalking" which he describes as intermittent weakness of his extremities preventing him from "even flushing the commode".  He makes the comment that he is "awake but weak and must sit down".  According to staff he is totally bedridden and nonambulatory.  He is placed in the [place] chair at least 3 times per week.   He describes diffuse joint pain in the context of psoriasis.  Triamcinolone is being applied to the psoriatic lesions.  Constitutional: No fever, significant weight change, fatigue  Eyes: No redness, discharge, pain, vision change ENT/mouth: No nasal congestion,  purulent discharge, earache, change in hearing, sore throat  Cardiovascular: No chest pain, palpitations, paroxysmal nocturnal dyspnea, claudication,  edema  Respiratory: No cough, sputum production, hemoptysis, significant snoring, apnea   Gastrointestinal: No heartburn, dysphagia, abdominal pain, nausea /vomiting, rectal bleeding, melena, change in bowels Genitourinary: No dysuria, hematuria, pyuria, incontinence, nocturia Neurologic: No dizziness, headache, syncope, seizures, numbness, tingling Psychiatric: No significant anxiety, depression, insomnia, anorexia Endocrine: No change in hair/skin/nails, excessive thirst, excessive hunger, excessive urination  Hematologic/lymphatic: No significant bruising, lymphadenopathy, abnormal bleeding Allergy/immunology: No itchy/watery eyes, significant sneezing, urticaria, angioedema  Physical exam:  Pertinent or positive findings: Hair is stark white, full and disheveled.  Pattern alopecia is present.  He is Geneticist, molecular.  He has bilateral ptosis.  The right pupil is larger than the left.  The right nasolabial fold is decreased compared to the left.  He has an old operative scar over the anterior sternum.  He has marked joint changes of the hands with marked prominence of the MCP joints with isolated DIP deviations of the fingers with isolated contractures.  The hands are deviated laterally.  Interosseous wasting is present.  There is marked atrophy of the lower extremities.  The right upper extremity is stronger than the left upper extremity.  Forearm atrophy is visible.  The right lower extremity is stronger than the left lower extremity.  He has bland irregular psoriatic lesions at the right knee and left elbow.  He has faint psoriatic dermatitis of the forehead and scalp.  General appearance: Adequately nourished; no acute distress, increased work of breathing is present.   Lymphatic: No lymphadenopathy about the head, neck, axilla. Eyes: No conjunctival inflammation or lid edema is  present. There is no scleral icterus. Ears:  External ear exam shows no significant lesions or deformities.   Nose:   External nasal examination shows no deformity or inflammation. Nasal mucosa are pink and moist without lesions, exudates Oral exam:  Lips and gums are healthy appearing. There is no oropharyngeal erythema or exudate. Neck:  No thyromegaly, masses, tenderness noted.    Heart:  Normal rate and regular rhythm. S1 and S2 normal without gallop, murmur, click, rub .  Lungs: Chest clear to auscultation without wheezes, rhonchi, rales, rubs. Abdomen: Bowel sounds are normal. Abdomen is soft and nontender with no organomegaly, hernias, masses. GU: Deferred  Extremities:  No cyanosis, clubbing, edema  Neurologic exam :Balance, Rhomberg, finger to nose testing could not be completed due to clinical state Skin: Warm & dry w/o tenting.   See summary under each active problem in the Problem List with associated updated therapeutic plan

## 2021-07-09 ENCOUNTER — Encounter: Payer: Self-pay | Admitting: Internal Medicine

## 2021-07-09 DIAGNOSIS — F411 Generalized anxiety disorder: Secondary | ICD-10-CM | POA: Diagnosis not present

## 2021-07-09 NOTE — Assessment & Plan Note (Signed)
Despite current values of albumin 3.5 and total protein 6.6; he has profound atrophy of the extremities.  Nutrition monitor will continue at the SNF.

## 2021-07-09 NOTE — Assessment & Plan Note (Signed)
LDL was 58, at goal of less than 70.  No change indicated

## 2021-07-09 NOTE — Assessment & Plan Note (Addendum)
BP controlled w/o antihypertensive medications  

## 2021-07-09 NOTE — Assessment & Plan Note (Signed)
Patient is not describing an anginal symptoms; but dementia invalidates responses.  He is bedridden and immobile.

## 2021-07-09 NOTE — Assessment & Plan Note (Signed)
See 07/08/2021 he confabulates about sleepwalking and profound upper and lower extremity weakness requiring that he sit down.  According to staff he is totally bedridden and only up in the [place] chair approximately 3 times per week.

## 2021-07-09 NOTE — Patient Instructions (Signed)
See assessment and plan under each diagnosis in the problem list and acutely for this visit 

## 2021-07-13 DIAGNOSIS — F411 Generalized anxiety disorder: Secondary | ICD-10-CM | POA: Diagnosis not present

## 2021-07-13 DIAGNOSIS — F01A4 Vascular dementia, mild, with anxiety: Secondary | ICD-10-CM | POA: Diagnosis not present

## 2021-07-14 DIAGNOSIS — F411 Generalized anxiety disorder: Secondary | ICD-10-CM | POA: Diagnosis not present

## 2021-07-15 DIAGNOSIS — I69351 Hemiplegia and hemiparesis following cerebral infarction affecting right dominant side: Secondary | ICD-10-CM | POA: Diagnosis not present

## 2021-07-15 DIAGNOSIS — Z1159 Encounter for screening for other viral diseases: Secondary | ICD-10-CM | POA: Diagnosis not present

## 2021-07-21 DIAGNOSIS — F411 Generalized anxiety disorder: Secondary | ICD-10-CM | POA: Diagnosis not present

## 2021-07-22 DIAGNOSIS — I69351 Hemiplegia and hemiparesis following cerebral infarction affecting right dominant side: Secondary | ICD-10-CM | POA: Diagnosis not present

## 2021-07-22 DIAGNOSIS — Z1159 Encounter for screening for other viral diseases: Secondary | ICD-10-CM | POA: Diagnosis not present

## 2021-07-29 DIAGNOSIS — I69351 Hemiplegia and hemiparesis following cerebral infarction affecting right dominant side: Secondary | ICD-10-CM | POA: Diagnosis not present

## 2021-07-29 DIAGNOSIS — Z1159 Encounter for screening for other viral diseases: Secondary | ICD-10-CM | POA: Diagnosis not present

## 2021-07-30 DIAGNOSIS — F411 Generalized anxiety disorder: Secondary | ICD-10-CM | POA: Diagnosis not present

## 2021-08-03 DIAGNOSIS — F411 Generalized anxiety disorder: Secondary | ICD-10-CM | POA: Diagnosis not present

## 2021-08-04 ENCOUNTER — Encounter: Payer: Self-pay | Admitting: Adult Health

## 2021-08-04 ENCOUNTER — Non-Acute Institutional Stay (INDEPENDENT_AMBULATORY_CARE_PROVIDER_SITE_OTHER): Payer: Medicare Other | Admitting: Adult Health

## 2021-08-04 DIAGNOSIS — Z0000 Encounter for general adult medical examination without abnormal findings: Secondary | ICD-10-CM

## 2021-08-05 DIAGNOSIS — Z1159 Encounter for screening for other viral diseases: Secondary | ICD-10-CM | POA: Diagnosis not present

## 2021-08-05 DIAGNOSIS — I69351 Hemiplegia and hemiparesis following cerebral infarction affecting right dominant side: Secondary | ICD-10-CM | POA: Diagnosis not present

## 2021-08-05 NOTE — Progress Notes (Signed)
Subjective:   Jason Cox is a 76 y.o. male who presents for Medicare Annual/Subsequent preventive examination.  Review of Systems    Review of Systems  Constitutional:  Negative for malaise/fatigue.  Respiratory:  Negative for cough and shortness of breath.   Cardiovascular:  Negative for chest pain, palpitations and leg swelling.  Gastrointestinal:  Negative for abdominal pain, constipation and heartburn.  Musculoskeletal:  Negative for back pain, joint pain and myalgias.  Skin: Negative.   Neurological:  Negative for dizziness.  Psychiatric/Behavioral:  The patient is not nervous/anxious.    Cardiac Risk Factors include: advanced age (>74men, >31 women);diabetes mellitus;hypertension;sedentary lifestyle     Objective:    Today's Vitals   08/04/21 0926 08/05/21 1020  BP: (!) 104/53   Pulse: 78   Resp: 20   Temp: 98 F (36.7 C)   SpO2: 96%   Weight: 143 lb 6.4 oz (65 kg)   Height:  (1.803 m)   PainSc:  0-No pain   Body mass index is 20 kg/m.  Advanced Directives 08/04/2021 06/11/2021 04/26/2021 04/07/2021 03/12/2021 03/11/2021 03/02/2021  Does Patient Have a Medical Advance Directive? Yes Yes Yes Yes Yes Yes Yes  Type of Advance Directive Out of facility DNR (pink MOST or yellow form) Out of facility DNR (pink MOST or yellow form) Out of facility DNR (pink MOST or yellow form) Out of facility DNR (pink MOST or yellow form) Out of facility DNR (pink MOST or yellow form) Out of facility DNR (pink MOST or yellow form) Out of facility DNR (pink MOST or yellow form)  Does patient want to make changes to medical advance directive? No - Patient declined No - Patient declined No - Patient declined No - Patient declined - No - Patient declined No - Patient declined  Would patient like information on creating a medical advance directive? - - - - - - -  Pre-existing out of facility DNR order (yellow form or pink MOST form) Yellow form placed in chart (order not valid for  inpatient use) - - - - - -    Current Medications (verified) Outpatient Encounter Medications as of 08/04/2021  Medication Sig   aspirin EC 81 MG tablet Take 162 mg by mouth daily. Swallow whole.   atorvastatin (LIPITOR) 40 MG tablet Take 40 mg by mouth daily.   escitalopram (LEXAPRO) 10 MG tablet Take 10 mg by mouth daily.   levothyroxine (SYNTHROID) 75 MCG tablet Take 75 mcg by mouth daily before breakfast.   memantine (NAMENDA XR) 28 MG CP24 24 hr capsule Take 28 mg by mouth daily. 9 am   miconazole (ZEASORB-AF) 2 % powder Apply 1 application topically as needed for itching (to groin and scrotum area substituted with shower to).   NON FORMULARY Diet Change: Dysphagia 3 diet with dysphagia 2 meats, thin liquids (NAS, ConCho)   omeprazole (PRILOSEC OTC) 20 MG tablet Take 20 mg by mouth at bedtime.   ondansetron (ZOFRAN-ODT) 8 MG disintegrating tablet Take 8 mg by mouth daily as needed.   senna-docusate (SENOKOT-S) 8.6-50 MG tablet Take 2 tablets by mouth 2 (two) times daily as needed. hold for diarrhea   sitaGLIPtin (JANUVIA) 50 MG tablet Take 50 mg by mouth daily.   triamcinolone cream (KENALOG) 0.1 % Apply 1 application topically 2 (two) times daily. Apply to all areas of psoriasis   [DISCONTINUED] feeding supplement, GLUCERNA SHAKE, (GLUCERNA SHAKE) LIQD Take 237 mLs by mouth in the morning and at bedtime. 9 am and 9 pm  No facility-administered encounter medications on file as of 08/04/2021.    Allergies (verified) Patient has no known allergies.   History: Past Medical History:  Diagnosis Date   Hypertension    Benign Essential   Hypothyroidism    Obesity    Osteoarthritis    PVD (peripheral vascular disease) (HCC)    Type 2 diabetes mellitus (HCC)    Past Surgical History:  Procedure Laterality Date   APPENDECTOMY     TONSILLECTOMY     Family History  Problem Relation Age of Onset   Osteoporosis Mother    Hypertension Mother    Cancer Mother    Heart attack Father     Heart attack Maternal Grandmother    Pneumonia Maternal Grandfather    Heart disease Paternal Grandmother    Heart disease Paternal Grandfather    Social History   Socioeconomic History   Marital status: Single    Spouse name: Not on file   Number of children: Not on file   Years of education: 12   Highest education level: Not on file  Occupational History   Occupation: RETIRED  Tobacco Use   Smoking status: Former   Smokeless tobacco: Never  Building services engineerVaping Use   Vaping Use: Never used  Substance and Sexual Activity   Alcohol use: Not Currently   Drug use: Never   Sexual activity: Not Currently  Other Topics Concern   Not on file  Social History Narrative   Not on file   Social Determinants of Health   Financial Resource Strain: Not on file  Food Insecurity: Not on file  Transportation Needs: Not on file  Physical Activity: Not on file  Stress: Not on file  Social Connections: Not on file    Tobacco Counseling Counseling given: Not Answered   Clinical Intake:  Pre-visit preparation completed: Yes  Pain : No/denies pain Pain Score: 0-No pain     BMI - recorded: 20 Nutritional Status: BMI of 19-24  Normal Nutritional Risks: Unintentional weight loss Diabetes: Yes CBG done?: Yes CBG resulted in Enter/ Edit results?: Yes (per facility) Did pt. bring in CBG monitor from home?: No  How often do you need to have someone help you when you read instructions, pamphlets, or other written materials from your doctor or pharmacy?: 5 - Always  Diabetic?yes  Interpreter Needed?: No      Activities of Daily Living In your present state of health, do you have any difficulty performing the following activities: 08/05/2021 04/26/2021  Hearing? - N  Vision? N N  Difficulty concentrating or making decisions? Malvin JohnsY Y  Walking or climbing stairs? Y Y  Dressing or bathing? Y Y  Doing errands, shopping? Malvin JohnsY Y  Preparing Food and eating ? Y Y  Using the Toilet? Y Y  In the past  six months, have you accidently leaked urine? Y Y  Do you have problems with loss of bowel control? Y Y  Managing your Medications? Y Y  Managing your Finances? Malvin JohnsY Y  Housekeeping or managing your Housekeeping? Malvin JohnsY Y  Some recent data might be hidden    Patient Care Team: Sharee HolsterGreen, Garrett Mitchum S, NP as PCP - General (Geriatric Medicine) Elfredia NevinsFusco, Lawrence, MD as Referring Physician (Internal Medicine)  Indicate any recent Medical Services you may have received from other than [facility] providers in the past year (date may be approximate).     Assessment:   This is a routine wellness examination for Jason Cox.  Hearing/Vision screen No results found.  Dietary issues  and exercise activities discussed: Current Exercise Habits: The patient does not participate in regular exercise at present, Exercise limited by: None identified   Goals Addressed   None    Depression Screen PHQ 2/9 Scores 08/05/2021 04/26/2021  PHQ - 2 Score 0 0    Fall Risk Fall Risk  08/05/2021 04/26/2021  Falls in the past year? 1 1  Number falls in past yr: 0 0  Injury with Fall? 0 0  Risk for fall due to : History of fall(s);Impaired balance/gait;Impaired mobility History of fall(s);Impaired balance/gait;Impaired mobility  Follow up - Falls evaluation completed    FALL RISK PREVENTION PERTAINING TO THE HOME:  Any stairs in or around the home? Yes  If so, are there any without handrails? No  Home free of loose throw rugs in walkways, pet beds, electrical cords, etc? Yes  Adequate lighting in your home to reduce risk of falls? Yes   ASSISTIVE DEVICES UTILIZED TO PREVENT FALLS:  Life alert? No  Use of a cane, walker or w/c? Yes  Grab bars in the bathroom? Yes  Shower chair or bench in shower? Yes  Elevated toilet seat or a handicapped toilet? Yes   TIMED UP AND GO:  Was the test performed? No .  Length of time to ambulate 10 feet: nonambulatory     Cognitive Function: MMSE - Mini Mental State Exam 08/05/2021  04/26/2021  Not completed: Unable to complete Unable to complete        Immunizations Immunization History  Administered Date(s) Administered   Influenza Inj Mdck Quad Pf 03/31/2021   Janssen (J&J) SARS-COV-2 Vaccination 10/04/2019   Moderna Covid-19 Vaccine Bivalent Booster 20yrs & up 04/20/2021   Moderna Sars-Covid-2 Vaccination 02/10/2021   PNEUMOCOCCAL CONJUGATE-20 04/02/2021   Pneumococcal Polysaccharide-23 02/06/2010   Tdap 04/28/2021    TDAP status: Up to date  Flu Vaccine status: Up to date  Pneumococcal vaccine status: Up to date  Covid-19 vaccine status: Completed vaccines  Qualifies for Shingles Vaccine? Yes   Zostavax completed Yes   Shingrix Completed?: Yes  Screening Tests Health Maintenance  Topic Date Due   FOOT EXAM  10/27/2021 (Originally 06/27/1955)   OPHTHALMOLOGY EXAM  12/22/2021 (Originally 06/27/1955)   Zoster Vaccines- Shingrix (1 of 2) 02/22/2022 (Originally 06/27/1995)   HEMOGLOBIN A1C  10/07/2021   URINE MICROALBUMIN  04/27/2022   TETANUS/TDAP  04/29/2031   Pneumonia Vaccine 43+ Years old  Completed   INFLUENZA VACCINE  Completed   COVID-19 Vaccine  Completed   Hepatitis C Screening  Completed   HPV VACCINES  Aged Out    Health Maintenance  There are no preventive care reminders to display for this patient.  Colorectal cancer screening: No longer required.   Lung Cancer Screening: (Low Dose CT Chest recommended if Age 107-80 years, 30 pack-year currently smoking OR have quit w/in 15years.) does not qualify.   Lung Cancer Screening Referral: n/a  Additional Screening:  Hepatitis C Screening: does not qualify; Completed   Vision Screening: Recommended annual ophthalmology exams for early detection of glaucoma and other disorders of the eye. Is the patient up to date with their annual eye exam?  Yes  Who is the provider or what is the name of the office in which the patient attends annual eye exams?  If pt is not established with a  provider, would they like to be referred to a provider to establish care? Yes .   Dental Screening: Recommended annual dental exams for proper oral hygiene  [employer]  Referral / Chronic Care Management: CRR required this visit?  No   CCM required this visit?  No      Plan:     I have personally reviewed and noted the following in the patients chart:   Medical and social history Use of alcohol, tobacco or illicit drugs  Current medications and supplements including opioid prescriptions. Patient is not currently taking opioid prescriptions. Functional ability and status Nutritional status Physical activity Advanced directives List of other physicians Hospitalizations, surgeries, and ER visits in previous 12 months Vitals Screenings to include cognitive, depression, and falls Referrals and appointments  In addition, I have reviewed and discussed with patient certain preventive protocols, quality metrics, and best practice recommendations. A written personalized care plan for preventive services as well as general preventive health recommendations were provided to patient.     Sharee Holster, NP   08/05/2021   Nurse Notes:

## 2021-08-10 ENCOUNTER — Non-Acute Institutional Stay (SKILLED_NURSING_FACILITY): Payer: Medicare Other | Admitting: Adult Health

## 2021-08-10 ENCOUNTER — Encounter: Payer: Self-pay | Admitting: Adult Health

## 2021-08-10 DIAGNOSIS — E079 Disorder of thyroid, unspecified: Secondary | ICD-10-CM | POA: Diagnosis not present

## 2021-08-10 DIAGNOSIS — E441 Mild protein-calorie malnutrition: Secondary | ICD-10-CM | POA: Diagnosis not present

## 2021-08-10 DIAGNOSIS — E1149 Type 2 diabetes mellitus with other diabetic neurological complication: Secondary | ICD-10-CM | POA: Diagnosis not present

## 2021-08-10 NOTE — Progress Notes (Signed)
Location:  [facility] Room Number: 111 Place of Service:  SNF ([id number])   CODE STATUS: dnr   No Known Allergies  Chief Complaint  Patient presents with   Medical Management of Chronic Issues          Thyroid disease:  Type 2 diabetes mellitus with neurological manifestations: Mild protein calorie malnutrition:     HPI:  He is a 76 year old long term resident of this facility being seen for the management of his chronic illnesses: Thyroid disease:  Type 2 diabetes mellitus with neurological manifestations: Mild protein calorie malnutrition: . There are no reports of uncontrolled pain. No changes in appetite; no reports of anxiety.   Past Medical History:  Diagnosis Date   Hypertension    Benign Essential   Hypothyroidism    Obesity    Osteoarthritis    PVD (peripheral vascular disease) ([place])    Type 2 diabetes mellitus ([place])     Past Surgical History:  Procedure Laterality Date   APPENDECTOMY     TONSILLECTOMY      Social History   Socioeconomic History   Marital status: Single    Spouse name: Not on file   Number of children: Not on file   Years of education: 12   Highest education level: Not on file  Occupational History   Occupation: RETIRED  Tobacco Use   Smoking status: Former   Smokeless tobacco: Never  Scientific laboratory technician Use: Never used  Substance and Sexual Activity   Alcohol use: Not Currently   Drug use: Never   Sexual activity: Not Currently  Other Topics Concern   Not on file  Social History Narrative   Not on file   Social Determinants of Health   Financial Resource Strain: Not on file  Food Insecurity: Not on file  Transportation Needs: Not on file  Physical Activity: Not on file  Stress: Not on file  Social Connections: Not on file  Intimate Partner Violence: Not on file   Family History  Problem Relation Age of Onset   Osteoporosis Mother    Hypertension Mother    Cancer Mother    Heart attack Father     Heart attack Maternal Grandmother    Pneumonia Maternal Grandfather    Heart disease Paternal Grandmother    Heart disease Paternal Grandfather       VITAL SIGNS BP (!) 108/56    Pulse 78    Temp 98 F (36.7 C)    Resp 20    Ht 5' 11"  (1.803 m)    Wt 143 lb 6.4 oz (65 kg)    BMI 20.00 kg/m   Outpatient Encounter Medications as of 08/10/2021  Medication Sig   aspirin EC 81 MG tablet Take 162 mg by mouth daily. Swallow whole.   atorvastatin (LIPITOR) 40 MG tablet Take 40 mg by mouth daily.   escitalopram (LEXAPRO) 10 MG tablet Take 10 mg by mouth daily.   levothyroxine (SYNTHROID) 75 MCG tablet Take 75 mcg by mouth daily before breakfast.   memantine (NAMENDA XR) 28 MG CP24 24 hr capsule Take 28 mg by mouth daily. 9 am   miconazole (ZEASORB-AF) 2 % powder Apply 1 application topically as needed for itching (to groin and scrotum area substituted with shower to).   NON FORMULARY Diet Change: Dysphagia 3 diet with dysphagia 2 meats, thin liquids (NAS, ConCho)   omeprazole (PRILOSEC OTC) 20 MG tablet Take 20 mg by mouth at  bedtime.   ondansetron (ZOFRAN-ODT) 8 MG disintegrating tablet Take 8 mg by mouth daily as needed.   senna-docusate (SENOKOT-S) 8.6-50 MG tablet Take 2 tablets by mouth 2 (two) times daily as needed. hold for diarrhea   sitaGLIPtin (JANUVIA) 50 MG tablet Take 50 mg by mouth daily.   triamcinolone cream (KENALOG) 0.1 % Apply 1 application topically 2 (two) times daily. Apply to all areas of psoriasis   No facility-administered encounter medications on file as of 08/10/2021.     SIGNIFICANT DIAGNOSTIC EXAMS  PREVIOUS  01-18-21: ct of head:  Brain: Mild chronic ischemic white matter disease is noted. Mild diffuse cortical atrophy is noted. No mass effect or midline shift is noted. Ventricular size is within normal limits. There is no evidence of mass lesion, hemorrhage or acute infarction. Vascular: No hyperdense vessel or unexpected calcification. Skull: Normal.  Negative for fracture or focal lesion. Sinuses/Orbits: No acute finding.  01-18-21: MRI/MRA of brain MRI: 1. Acute infarct in the posterolateral right medulla, likely right PICA territory. Additional acute right PCA territory infarcts in the right occipital lobe and posterior right corpus callosum. Associated mild edema without mass effect. 2. Developing encephalomalacia in the areas of previously seen infarct in the inferior right cerebellum. 3. Remote lacunar infarcts in the left thalamus, right caudate, and bilateral corona radiata. 4. Moderate chronic microvascular disease. MRA: 1. Severe stenosis of bilateral P2 PCAs with poor flow related signal in the more distal PCAs. Also, severe stenosis of the distal left M1 MCA, bilateral M2 MCA branches and bilateral A2 ACAs. 2. Similar multifocal moderate narrowing of the distal right intradural vertebral artery. Similar flow related signal in the proximal right PICA, but not the more distal right PICA.  01-19-21: 2-d echo:  1. Left ventricular ejection fraction, by estimation, is 70 to 75%. The  left ventricle has hyperdynamic function. The left ventricle has no  regional wall motion abnormalities. There is mild left ventricular  hypertrophy. Left ventricular diastolic  parameters are indeterminate.   01-28-21: chest x-ray:  1.  Prior CABG.  Heart size normal. 2. Mild left base subsegmental atelectasis. Tiny left pleural effusion cannot be excluded.  3. Right posterior eighth rib fracture. This may be acute. No pneumothorax.  01-28-21: KUB 1. Nonobstructive bowel gas pattern. 2. Prominent colonic stool volume. Imaging appearance raises the question of chronic constipation.  01-28-21: EKG Sinus rhythm with first degree AV block; PAC: R BBB  NO NEW EXAMS.   LABS REVIEWED: PREVIOUS   01-18-21: wbc 13.5; hgb 13.2; hct 40.5; mcv 96.4 plt 377; glucose 260; bun 22; creat 1.19; k+ 4.9; na++ 137; ca 9.6 GFR>60; alk phos 161;  albumin 3.6; urine culture:  staphylococcus aureus 01-20-21: wbc 11.6; hgb 12.8; hct 39.0; mcv 96.5 plt 333; glucose 207; bun 26; creat 1.22; k+ 5.1; na++ 139; ca 9.2 GFR>60; hgb a1c 8.3; chol 217; LDL 150; trig 151; hdl 37 01-21-21: wbc 13.5; hgb 13.6; hct 41.6; mcv 95.9 plt 390; glucose 243; bun 31;creat 1.28; k+ 5.1; na++ 137; ca 9.5 GFR 59; alk phos 134; albumin 3.2 mag 1.8   01-27-21: wbc 20.8; hgb 14.6; hct 43.3; mcv 92.7 pt 468; glucose 414; bun 41; creat 1.58; k+ 5.6; na++ 131; ca 9.5; GFR 45; alk phos 142; albumin 3.4 troponin #1: 10; #2: 13 01-28-21: wbc 17.3; hgb 13.5; hct 40.4; mcv 94.0 plt 437; glucose 292; bun 42; creat 1.39; k+ 4.9; na++ 130; ca 9.2 GFR 52; troponin #3 12. Mag 1.8  01-29-21: guaiac positive 02-02-21:  guaiac negative 02-05-21: wbc 11.4; hgb 12.9; hct 39.6; mcv 97.3 plt 397; glucose 194; bun 17; creat 1.13; k+ 4.9; na++ 137; ca 8.4; GFR>60 03-12-21: wbc 9.9; hgb 13.1; hct 39.8; mcv 95.4 plt 296; glucose 147; bun 23; creat 1.25; k+ 4.5; na++ 140; ca 9.0; GFR 59; liver normal albumin 3.3 03-17-21: tsh 2.566 04-08-21: chol 125; ldl 58; trig 213; hdl 24; hgb a1c 6.1; liver normal albumin 3.5;  04-27-21: urine micro albumin 7.2   NO NEW LABS.   Review of Systems  Constitutional:  Negative for malaise/fatigue.  Respiratory:  Negative for cough and shortness of breath.   Cardiovascular:  Negative for chest pain, palpitations and leg swelling.  Gastrointestinal:  Negative for abdominal pain, constipation and heartburn.  Musculoskeletal:  Negative for back pain, joint pain and myalgias.  Skin: Negative.   Neurological:  Negative for dizziness.  Psychiatric/Behavioral:  The patient is not nervous/anxious.    Physical Exam Constitutional:      General: He is not in acute distress.    Appearance: He is well-developed. He is not diaphoretic.  Neck:     Thyroid: No thyromegaly.  Cardiovascular:     Rate and Rhythm: Normal rate and regular rhythm.     Pulses: Normal pulses.     Heart sounds: Normal heart  sounds.  Pulmonary:     Effort: Pulmonary effort is normal. No respiratory distress.     Breath sounds: Normal breath sounds.  Abdominal:     General: Bowel sounds are normal. There is no distension.     Palpations: Abdomen is soft.     Tenderness: There is no abdominal tenderness.  Musculoskeletal:     Cervical back: Neck supple.     Right lower leg: No edema.     Left lower leg: No edema.     Comments: Able to move all extremities   Lymphadenopathy:     Cervical: No cervical adenopathy.  Skin:    General: Skin is warm and dry.  Neurological:     Mental Status: He is alert. Mental status is at baseline.  Psychiatric:        Mood and Affect: Mood normal.     ASSESSMENT/ PLAN:  TODAY  Thyroid disease: is stable tsh 2.566 will continue synthroid 75 mcg daily   2. Type 2 diabetes mellitus with neurological manifestations: is stable hgb a1c is 6.1 will continue januvia 25 mg daily   3. Mild protein calorie malnutrition: is stable albumin 3.2 will continue supplements as directed.    PREVIOUS   4. Hyperlipidemia associated with type 2 diabetes mellitus: LDL 58 will continue lipitor 40 mg daily   5. Hypertension associated with type 2 diabetes mellitus: is stable b/p 108/56 will continue cozaar 12.5 mg daily   6. GERD without esophagitis: is stable will continue prilosec 20 mg daily and zofran 8 mg ac/hs  7. Chronic constipation: is stable will continue senna s 2 tabs twice daily   8. Stroke: is stable will continue asa 162 mg daily has completed plavix        Ok Edwards NP [facility]   [phone]

## 2021-08-12 ENCOUNTER — Other Ambulatory Visit (HOSPITAL_COMMUNITY)
Admission: RE | Admit: 2021-08-12 | Discharge: 2021-08-12 | Disposition: A | Discharge status: Home / Self Care | Payer: Medicare Other | Source: Skilled Nursing Facility | Attending: Adult Health | Admitting: Adult Health

## 2021-08-12 DIAGNOSIS — E1149 Type 2 diabetes mellitus with other diabetic neurological complication: Secondary | ICD-10-CM | POA: Insufficient documentation

## 2021-08-12 DIAGNOSIS — Z1159 Encounter for screening for other viral diseases: Secondary | ICD-10-CM | POA: Diagnosis not present

## 2021-08-12 DIAGNOSIS — I69351 Hemiplegia and hemiparesis following cerebral infarction affecting right dominant side: Secondary | ICD-10-CM | POA: Diagnosis not present

## 2021-08-12 LAB — HEMOGLOBIN A1C
Hgb A1c MFr Bld: 5.4 % (ref 4.8–5.6)
Mean Plasma Glucose: 108.28 mg/dL

## 2021-08-13 DIAGNOSIS — F411 Generalized anxiety disorder: Secondary | ICD-10-CM | POA: Diagnosis not present

## 2021-08-19 DIAGNOSIS — I69351 Hemiplegia and hemiparesis following cerebral infarction affecting right dominant side: Secondary | ICD-10-CM | POA: Diagnosis not present

## 2021-08-19 DIAGNOSIS — Z1159 Encounter for screening for other viral diseases: Secondary | ICD-10-CM | POA: Diagnosis not present

## 2021-08-20 DIAGNOSIS — F411 Generalized anxiety disorder: Secondary | ICD-10-CM | POA: Diagnosis not present

## 2021-08-24 DIAGNOSIS — R279 Unspecified lack of coordination: Secondary | ICD-10-CM | POA: Diagnosis not present

## 2021-08-24 DIAGNOSIS — I63541 Cerebral infarction due to unspecified occlusion or stenosis of right cerebellar artery: Secondary | ICD-10-CM | POA: Diagnosis not present

## 2021-08-24 DIAGNOSIS — I69351 Hemiplegia and hemiparesis following cerebral infarction affecting right dominant side: Secondary | ICD-10-CM | POA: Diagnosis not present

## 2021-08-24 DIAGNOSIS — F411 Generalized anxiety disorder: Secondary | ICD-10-CM | POA: Diagnosis not present

## 2021-08-24 DIAGNOSIS — F01A4 Vascular dementia, mild, with anxiety: Secondary | ICD-10-CM | POA: Diagnosis not present

## 2021-08-24 DIAGNOSIS — M6281 Muscle weakness (generalized): Secondary | ICD-10-CM | POA: Diagnosis not present

## 2021-08-25 DIAGNOSIS — M6281 Muscle weakness (generalized): Secondary | ICD-10-CM | POA: Diagnosis not present

## 2021-08-25 DIAGNOSIS — M159 Polyosteoarthritis, unspecified: Secondary | ICD-10-CM | POA: Diagnosis not present

## 2021-08-25 DIAGNOSIS — G463 Brain stem stroke syndrome: Secondary | ICD-10-CM | POA: Diagnosis not present

## 2021-08-25 DIAGNOSIS — I63541 Cerebral infarction due to unspecified occlusion or stenosis of right cerebellar artery: Secondary | ICD-10-CM | POA: Diagnosis not present

## 2021-08-25 DIAGNOSIS — R279 Unspecified lack of coordination: Secondary | ICD-10-CM | POA: Diagnosis not present

## 2021-08-25 DIAGNOSIS — R2681 Unsteadiness on feet: Secondary | ICD-10-CM | POA: Diagnosis not present

## 2021-08-25 DIAGNOSIS — I69351 Hemiplegia and hemiparesis following cerebral infarction affecting right dominant side: Secondary | ICD-10-CM | POA: Diagnosis not present

## 2021-08-25 DIAGNOSIS — Z741 Need for assistance with personal care: Secondary | ICD-10-CM | POA: Diagnosis not present

## 2021-08-26 DIAGNOSIS — I69351 Hemiplegia and hemiparesis following cerebral infarction affecting right dominant side: Secondary | ICD-10-CM | POA: Diagnosis not present

## 2021-08-26 DIAGNOSIS — G463 Brain stem stroke syndrome: Secondary | ICD-10-CM | POA: Diagnosis not present

## 2021-08-26 DIAGNOSIS — Z1159 Encounter for screening for other viral diseases: Secondary | ICD-10-CM | POA: Diagnosis not present

## 2021-08-26 DIAGNOSIS — I63541 Cerebral infarction due to unspecified occlusion or stenosis of right cerebellar artery: Secondary | ICD-10-CM | POA: Diagnosis not present

## 2021-08-26 DIAGNOSIS — M6281 Muscle weakness (generalized): Secondary | ICD-10-CM | POA: Diagnosis not present

## 2021-08-26 DIAGNOSIS — R2681 Unsteadiness on feet: Secondary | ICD-10-CM | POA: Diagnosis not present

## 2021-08-26 DIAGNOSIS — M159 Polyosteoarthritis, unspecified: Secondary | ICD-10-CM | POA: Diagnosis not present

## 2021-08-27 DIAGNOSIS — I69351 Hemiplegia and hemiparesis following cerebral infarction affecting right dominant side: Secondary | ICD-10-CM | POA: Diagnosis not present

## 2021-08-27 DIAGNOSIS — M159 Polyosteoarthritis, unspecified: Secondary | ICD-10-CM | POA: Diagnosis not present

## 2021-08-27 DIAGNOSIS — I63541 Cerebral infarction due to unspecified occlusion or stenosis of right cerebellar artery: Secondary | ICD-10-CM | POA: Diagnosis not present

## 2021-08-27 DIAGNOSIS — G463 Brain stem stroke syndrome: Secondary | ICD-10-CM | POA: Diagnosis not present

## 2021-08-27 DIAGNOSIS — R2681 Unsteadiness on feet: Secondary | ICD-10-CM | POA: Diagnosis not present

## 2021-08-27 DIAGNOSIS — M6281 Muscle weakness (generalized): Secondary | ICD-10-CM | POA: Diagnosis not present

## 2021-08-27 DIAGNOSIS — F411 Generalized anxiety disorder: Secondary | ICD-10-CM | POA: Diagnosis not present

## 2021-08-30 DIAGNOSIS — M6281 Muscle weakness (generalized): Secondary | ICD-10-CM | POA: Diagnosis not present

## 2021-08-30 DIAGNOSIS — R2681 Unsteadiness on feet: Secondary | ICD-10-CM | POA: Diagnosis not present

## 2021-08-30 DIAGNOSIS — I69351 Hemiplegia and hemiparesis following cerebral infarction affecting right dominant side: Secondary | ICD-10-CM | POA: Diagnosis not present

## 2021-08-30 DIAGNOSIS — M159 Polyosteoarthritis, unspecified: Secondary | ICD-10-CM | POA: Diagnosis not present

## 2021-08-30 DIAGNOSIS — G463 Brain stem stroke syndrome: Secondary | ICD-10-CM | POA: Diagnosis not present

## 2021-08-30 DIAGNOSIS — I63541 Cerebral infarction due to unspecified occlusion or stenosis of right cerebellar artery: Secondary | ICD-10-CM | POA: Diagnosis not present

## 2021-08-31 ENCOUNTER — Encounter: Payer: Self-pay | Admitting: Adult Health

## 2021-08-31 ENCOUNTER — Non-Acute Institutional Stay (SKILLED_NURSING_FACILITY): Payer: Medicare Other | Admitting: Adult Health

## 2021-08-31 DIAGNOSIS — E785 Hyperlipidemia, unspecified: Secondary | ICD-10-CM | POA: Diagnosis not present

## 2021-08-31 DIAGNOSIS — I152 Hypertension secondary to endocrine disorders: Secondary | ICD-10-CM | POA: Diagnosis not present

## 2021-08-31 DIAGNOSIS — I63541 Cerebral infarction due to unspecified occlusion or stenosis of right cerebellar artery: Secondary | ICD-10-CM | POA: Diagnosis not present

## 2021-08-31 DIAGNOSIS — K219 Gastro-esophageal reflux disease without esophagitis: Secondary | ICD-10-CM | POA: Diagnosis not present

## 2021-08-31 DIAGNOSIS — M6281 Muscle weakness (generalized): Secondary | ICD-10-CM | POA: Diagnosis not present

## 2021-08-31 DIAGNOSIS — E1159 Type 2 diabetes mellitus with other circulatory complications: Secondary | ICD-10-CM | POA: Diagnosis not present

## 2021-08-31 DIAGNOSIS — E1169 Type 2 diabetes mellitus with other specified complication: Secondary | ICD-10-CM | POA: Diagnosis not present

## 2021-08-31 DIAGNOSIS — R2681 Unsteadiness on feet: Secondary | ICD-10-CM | POA: Diagnosis not present

## 2021-08-31 DIAGNOSIS — I69351 Hemiplegia and hemiparesis following cerebral infarction affecting right dominant side: Secondary | ICD-10-CM | POA: Diagnosis not present

## 2021-08-31 DIAGNOSIS — G463 Brain stem stroke syndrome: Secondary | ICD-10-CM | POA: Diagnosis not present

## 2021-08-31 DIAGNOSIS — M159 Polyosteoarthritis, unspecified: Secondary | ICD-10-CM | POA: Diagnosis not present

## 2021-08-31 NOTE — Progress Notes (Signed)
Location:  [place] Room Number: 111-W Place of Service:  SNF (31)   CODE STATUS: DNR  No Known Allergies  Chief Complaint  Patient presents with   Medical Management of Chronic Issues              Hyperlipidemia associated with type 2 diabetes mellitus:  Hypertension associated with type 2 diabetes mellitus: GERD without esophagitis:    HPI:  He is a 76 year old long term resident of this facility being seen for the management of his chronic illnesses:  Hyperlipidemia associated with type 2 diabetes mellitus:  Hypertension associated with type 2 diabetes mellitus: GERD without esophagitis. There are no reports of uncontrolled pain. No reports of changes in appetite; no reports of insomnia present.   Past Medical History:  Diagnosis Date   Hypertension    Benign Essential   Hypothyroidism    Obesity    Osteoarthritis    PVD (peripheral vascular disease) ([place])    Type 2 diabetes mellitus ([place])     Past Surgical History:  Procedure Laterality Date   APPENDECTOMY     TONSILLECTOMY      Social History   Socioeconomic History   Marital status: Single    Spouse name: Not on file   Number of children: Not on file   Years of education: 12   Highest education level: Not on file  Occupational History   Occupation: RETIRED  Tobacco Use   Smoking status: Former   Smokeless tobacco: Never  Scientific laboratory technician Use: Never used  Substance and Sexual Activity   Alcohol use: Not Currently   Drug use: Never   Sexual activity: Not Currently  Other Topics Concern   Not on file  Social History Narrative   Not on file   Social Determinants of Health   Financial Resource Strain: Not on file  Food Insecurity: Not on file  Transportation Needs: Not on file  Physical Activity: Not on file  Stress: Not on file  Social Connections: Not on file  Intimate Partner Violence: Not on file   Family History  Problem Relation Age of Onset   Osteoporosis  Mother    Hypertension Mother    Cancer Mother    Heart attack Father    Heart attack Maternal Grandmother    Pneumonia Maternal Grandfather    Heart disease Paternal Grandmother    Heart disease Paternal Grandfather       VITAL SIGNS BP 126/72    Pulse 77    Temp (!) 97.1 F (36.2 C)    Resp 20    SpO2 96%   Outpatient Encounter Medications as of 08/31/2021  Medication Sig   aspirin EC 81 MG tablet Take 162 mg by mouth daily. Swallow whole.   atorvastatin (LIPITOR) 40 MG tablet Take 40 mg by mouth daily.   escitalopram (LEXAPRO) 10 MG tablet Take 10 mg by mouth daily.   levothyroxine (SYNTHROID) 75 MCG tablet Take 75 mcg by mouth daily before breakfast.   memantine (NAMENDA XR) 28 MG CP24 24 hr capsule Take 28 mg by mouth daily. 9 am   miconazole (ZEASORB-AF) 2 % powder Apply 1 application topically as needed for itching (to groin and scrotum area substituted with shower to).   NON FORMULARY Diet Change: Dysphagia 3 diet with dysphagia 2 meats, thin liquids (NAS, ConCho)   omeprazole (PRILOSEC OTC) 20 MG tablet Take 20 mg by mouth at bedtime.   ondansetron (ZOFRAN-ODT) 8 MG  disintegrating tablet Take 8 mg by mouth daily as needed.   senna-docusate (SENOKOT-S) 8.6-50 MG tablet Take 2 tablets by mouth 2 (two) times daily as needed. hold for diarrhea   sitaGLIPtin (JANUVIA) 50 MG tablet Take 50 mg by mouth daily.   triamcinolone cream (KENALOG) 0.1 % Apply 1 application topically 2 (two) times daily. Apply to all areas of psoriasis   No facility-administered encounter medications on file as of 08/31/2021.     SIGNIFICANT DIAGNOSTIC EXAMS  PREVIOUS  01-18-21: ct of head:  Brain: Mild chronic ischemic white matter disease is noted. Mild diffuse cortical atrophy is noted. No mass effect or midline shift is noted. Ventricular size is within normal limits. There is no evidence of mass lesion, hemorrhage or acute infarction. Vascular: No hyperdense vessel or unexpected  calcification. Skull: Normal. Negative for fracture or focal lesion. Sinuses/Orbits: No acute finding.  01-18-21: MRI/MRA of brain MRI: 1. Acute infarct in the posterolateral right medulla, likely right PICA territory. Additional acute right PCA territory infarcts in the right occipital lobe and posterior right corpus callosum. Associated mild edema without mass effect. 2. Developing encephalomalacia in the areas of previously seen infarct in the inferior right cerebellum. 3. Remote lacunar infarcts in the left thalamus, right caudate, and bilateral corona radiata. 4. Moderate chronic microvascular disease. MRA: 1. Severe stenosis of bilateral P2 PCAs with poor flow related signal in the more distal PCAs. Also, severe stenosis of the distal left M1 MCA, bilateral M2 MCA branches and bilateral A2 ACAs. 2. Similar multifocal moderate narrowing of the distal right intradural vertebral artery. Similar flow related signal in the proximal right PICA, but not the more distal right PICA.  01-19-21: 2-d echo:  1. Left ventricular ejection fraction, by estimation, is 70 to 75%. The  left ventricle has hyperdynamic function. The left ventricle has no  regional wall motion abnormalities. There is mild left ventricular  hypertrophy. Left ventricular diastolic  parameters are indeterminate.   01-28-21: chest x-ray:  1.  Prior CABG.  Heart size normal. 2. Mild left base subsegmental atelectasis. Tiny left pleural effusion cannot be excluded.  3. Right posterior eighth rib fracture. This may be acute. No pneumothorax.  01-28-21: KUB 1. Nonobstructive bowel gas pattern. 2. Prominent colonic stool volume. Imaging appearance raises the question of chronic constipation.  01-28-21: EKG Sinus rhythm with first degree AV block; PAC: R BBB  NO NEW EXAMS.   LABS REVIEWED: PREVIOUS   01-18-21: wbc 13.5; hgb 13.2; hct 40.5; mcv 96.4 plt 377; glucose 260; bun 22; creat 1.19; k+ 4.9; na++ 137; ca 9.6 GFR>60; alk phos  161;  albumin 3.6; urine culture: staphylococcus aureus 01-20-21: wbc 11.6; hgb 12.8; hct 39.0; mcv 96.5 plt 333; glucose 207; bun 26; creat 1.22; k+ 5.1; na++ 139; ca 9.2 GFR>60; hgb a1c 8.3; chol 217; LDL 150; trig 151; hdl 37 01-21-21: wbc 13.5; hgb 13.6; hct 41.6; mcv 95.9 plt 390; glucose 243; bun 31;creat 1.28; k+ 5.1; na++ 137; ca 9.5 GFR 59; alk phos 134; albumin 3.2 mag 1.8   01-27-21: wbc 20.8; hgb 14.6; hct 43.3; mcv 92.7 pt 468; glucose 414; bun 41; creat 1.58; k+ 5.6; na++ 131; ca 9.5; GFR 45; alk phos 142; albumin 3.4 troponin #1: 10; #2: 13 01-28-21: wbc 17.3; hgb 13.5; hct 40.4; mcv 94.0 plt 437; glucose 292; bun 42; creat 1.39; k+ 4.9; na++ 130; ca 9.2 GFR 52; troponin #3 12. Mag 1.8  01-29-21: guaiac positive 02-02-21: guaiac negative 02-05-21: wbc 11.4; hgb 12.9;  hct 39.6; mcv 97.3 plt 397; glucose 194; bun 17; creat 1.13; k+ 4.9; na++ 137; ca 8.4; GFR>60 03-12-21: wbc 9.9; hgb 13.1; hct 39.8; mcv 95.4 plt 296; glucose 147; bun 23; creat 1.25; k+ 4.5; na++ 140; ca 9.0; GFR 59; liver normal albumin 3.3 03-17-21: tsh 2.566 04-08-21: chol 125; ldl 58; trig 213; hdl 24; hgb a1c 6.1; liver normal albumin 3.5;  04-27-21: urine micro albumin 7.2   NO NEW LABS.   Review of Systems  Constitutional:  Negative for malaise/fatigue.  Respiratory:  Negative for cough and shortness of breath.   Cardiovascular:  Negative for chest pain, palpitations and leg swelling.  Gastrointestinal:  Negative for abdominal pain, constipation and heartburn.  Musculoskeletal:  Negative for back pain, joint pain and myalgias.  Skin: Negative.   Neurological:  Negative for dizziness.  Psychiatric/Behavioral:  The patient is not nervous/anxious.    Physical Exam Constitutional:      General: He is not in acute distress.    Appearance: He is well-developed. He is not diaphoretic.  Neck:     Thyroid: No thyromegaly.  Cardiovascular:     Rate and Rhythm: Normal rate and regular rhythm.     Pulses: Normal pulses.      Heart sounds: Normal heart sounds.  Pulmonary:     Effort: Pulmonary effort is normal. No respiratory distress.     Breath sounds: Normal breath sounds.  Abdominal:     General: Bowel sounds are normal. There is no distension.     Palpations: Abdomen is soft.     Tenderness: There is no abdominal tenderness.  Musculoskeletal:     Cervical back: Neck supple.     Right lower leg: No edema.     Left lower leg: No edema.     Comments: Able to move all extremities    Lymphadenopathy:     Cervical: No cervical adenopathy.  Skin:    General: Skin is warm and dry.  Neurological:     Mental Status: He is alert. Mental status is at baseline.  Psychiatric:        Mood and Affect: Mood normal.      ASSESSMENT/ PLAN:  TODAY  Hyperlipidemia associated with type 2 diabetes mellitus: LDL 58; will continue lipitor 40 mg daily 126/72  2. Hypertension associated with type 2 diabetes mellitus: is stable b/p 126/72 will continue cozaar 12.5 mg daily   3. GERD without esophagitis: is stable will continue prilosec 20 mg daily   PREVIOUS   4. Chronic constipation: is stable will continue senna s 2 tabs twice daily   5. Stroke: is stable will continue asa 162 mg daily has completed plavix   6. Thyroid disease: is stable tsh 2.566 will continue synthroid 75 mcg daily   7. Type 2 diabetes mellitus with neurological manifestations: is stable hgb a1c is 6.1 will continue januvia 25 mg daily   8. Mild protein calorie malnutrition: is stable albumin 3.2 will continue supplements as directed.     Ok Edwards NP [facility]  call [phone]

## 2021-09-01 DIAGNOSIS — G463 Brain stem stroke syndrome: Secondary | ICD-10-CM | POA: Diagnosis not present

## 2021-09-01 DIAGNOSIS — I69351 Hemiplegia and hemiparesis following cerebral infarction affecting right dominant side: Secondary | ICD-10-CM | POA: Diagnosis not present

## 2021-09-01 DIAGNOSIS — I63541 Cerebral infarction due to unspecified occlusion or stenosis of right cerebellar artery: Secondary | ICD-10-CM | POA: Diagnosis not present

## 2021-09-01 DIAGNOSIS — M6281 Muscle weakness (generalized): Secondary | ICD-10-CM | POA: Diagnosis not present

## 2021-09-01 DIAGNOSIS — F411 Generalized anxiety disorder: Secondary | ICD-10-CM | POA: Diagnosis not present

## 2021-09-01 DIAGNOSIS — M159 Polyosteoarthritis, unspecified: Secondary | ICD-10-CM | POA: Diagnosis not present

## 2021-09-01 DIAGNOSIS — R2681 Unsteadiness on feet: Secondary | ICD-10-CM | POA: Diagnosis not present

## 2021-09-02 DIAGNOSIS — I63541 Cerebral infarction due to unspecified occlusion or stenosis of right cerebellar artery: Secondary | ICD-10-CM | POA: Diagnosis not present

## 2021-09-02 DIAGNOSIS — I69351 Hemiplegia and hemiparesis following cerebral infarction affecting right dominant side: Secondary | ICD-10-CM | POA: Diagnosis not present

## 2021-09-02 DIAGNOSIS — G463 Brain stem stroke syndrome: Secondary | ICD-10-CM | POA: Diagnosis not present

## 2021-09-02 DIAGNOSIS — R2681 Unsteadiness on feet: Secondary | ICD-10-CM | POA: Diagnosis not present

## 2021-09-02 DIAGNOSIS — M159 Polyosteoarthritis, unspecified: Secondary | ICD-10-CM | POA: Diagnosis not present

## 2021-09-02 DIAGNOSIS — M6281 Muscle weakness (generalized): Secondary | ICD-10-CM | POA: Diagnosis not present

## 2021-09-03 DIAGNOSIS — M6281 Muscle weakness (generalized): Secondary | ICD-10-CM | POA: Diagnosis not present

## 2021-09-03 DIAGNOSIS — R2681 Unsteadiness on feet: Secondary | ICD-10-CM | POA: Diagnosis not present

## 2021-09-03 DIAGNOSIS — I69351 Hemiplegia and hemiparesis following cerebral infarction affecting right dominant side: Secondary | ICD-10-CM | POA: Diagnosis not present

## 2021-09-03 DIAGNOSIS — I63541 Cerebral infarction due to unspecified occlusion or stenosis of right cerebellar artery: Secondary | ICD-10-CM | POA: Diagnosis not present

## 2021-09-03 DIAGNOSIS — M159 Polyosteoarthritis, unspecified: Secondary | ICD-10-CM | POA: Diagnosis not present

## 2021-09-03 DIAGNOSIS — G463 Brain stem stroke syndrome: Secondary | ICD-10-CM | POA: Diagnosis not present

## 2021-09-06 DIAGNOSIS — I69351 Hemiplegia and hemiparesis following cerebral infarction affecting right dominant side: Secondary | ICD-10-CM | POA: Diagnosis not present

## 2021-09-06 DIAGNOSIS — M159 Polyosteoarthritis, unspecified: Secondary | ICD-10-CM | POA: Diagnosis not present

## 2021-09-06 DIAGNOSIS — G463 Brain stem stroke syndrome: Secondary | ICD-10-CM | POA: Diagnosis not present

## 2021-09-06 DIAGNOSIS — M6281 Muscle weakness (generalized): Secondary | ICD-10-CM | POA: Diagnosis not present

## 2021-09-06 DIAGNOSIS — R2681 Unsteadiness on feet: Secondary | ICD-10-CM | POA: Diagnosis not present

## 2021-09-06 DIAGNOSIS — I63541 Cerebral infarction due to unspecified occlusion or stenosis of right cerebellar artery: Secondary | ICD-10-CM | POA: Diagnosis not present

## 2021-09-07 DIAGNOSIS — M6281 Muscle weakness (generalized): Secondary | ICD-10-CM | POA: Diagnosis not present

## 2021-09-07 DIAGNOSIS — M159 Polyosteoarthritis, unspecified: Secondary | ICD-10-CM | POA: Diagnosis not present

## 2021-09-07 DIAGNOSIS — R2681 Unsteadiness on feet: Secondary | ICD-10-CM | POA: Diagnosis not present

## 2021-09-07 DIAGNOSIS — I63541 Cerebral infarction due to unspecified occlusion or stenosis of right cerebellar artery: Secondary | ICD-10-CM | POA: Diagnosis not present

## 2021-09-07 DIAGNOSIS — I69351 Hemiplegia and hemiparesis following cerebral infarction affecting right dominant side: Secondary | ICD-10-CM | POA: Diagnosis not present

## 2021-09-07 DIAGNOSIS — G463 Brain stem stroke syndrome: Secondary | ICD-10-CM | POA: Diagnosis not present

## 2021-09-08 DIAGNOSIS — I63541 Cerebral infarction due to unspecified occlusion or stenosis of right cerebellar artery: Secondary | ICD-10-CM | POA: Diagnosis not present

## 2021-09-08 DIAGNOSIS — G463 Brain stem stroke syndrome: Secondary | ICD-10-CM | POA: Diagnosis not present

## 2021-09-08 DIAGNOSIS — M6281 Muscle weakness (generalized): Secondary | ICD-10-CM | POA: Diagnosis not present

## 2021-09-08 DIAGNOSIS — M159 Polyosteoarthritis, unspecified: Secondary | ICD-10-CM | POA: Diagnosis not present

## 2021-09-08 DIAGNOSIS — I69351 Hemiplegia and hemiparesis following cerebral infarction affecting right dominant side: Secondary | ICD-10-CM | POA: Diagnosis not present

## 2021-09-08 DIAGNOSIS — R2681 Unsteadiness on feet: Secondary | ICD-10-CM | POA: Diagnosis not present

## 2021-09-09 DIAGNOSIS — I69351 Hemiplegia and hemiparesis following cerebral infarction affecting right dominant side: Secondary | ICD-10-CM | POA: Diagnosis not present

## 2021-09-09 DIAGNOSIS — G463 Brain stem stroke syndrome: Secondary | ICD-10-CM | POA: Diagnosis not present

## 2021-09-09 DIAGNOSIS — M6281 Muscle weakness (generalized): Secondary | ICD-10-CM | POA: Diagnosis not present

## 2021-09-09 DIAGNOSIS — I63541 Cerebral infarction due to unspecified occlusion or stenosis of right cerebellar artery: Secondary | ICD-10-CM | POA: Diagnosis not present

## 2021-09-09 DIAGNOSIS — M159 Polyosteoarthritis, unspecified: Secondary | ICD-10-CM | POA: Diagnosis not present

## 2021-09-09 DIAGNOSIS — R2681 Unsteadiness on feet: Secondary | ICD-10-CM | POA: Diagnosis not present

## 2021-09-10 ENCOUNTER — Encounter: Payer: Self-pay | Admitting: Adult Health

## 2021-09-10 ENCOUNTER — Non-Acute Institutional Stay (SKILLED_NURSING_FACILITY): Payer: Medicare Other | Admitting: Adult Health

## 2021-09-10 DIAGNOSIS — F01C0 Vascular dementia, severe, without behavioral disturbance, psychotic disturbance, mood disturbance, and anxiety: Secondary | ICD-10-CM

## 2021-09-10 DIAGNOSIS — E441 Mild protein-calorie malnutrition: Secondary | ICD-10-CM

## 2021-09-10 DIAGNOSIS — E1122 Type 2 diabetes mellitus with diabetic chronic kidney disease: Secondary | ICD-10-CM | POA: Diagnosis not present

## 2021-09-10 DIAGNOSIS — I700 Atherosclerosis of aorta: Secondary | ICD-10-CM | POA: Diagnosis not present

## 2021-09-10 DIAGNOSIS — N183 Chronic kidney disease, stage 3 unspecified: Secondary | ICD-10-CM | POA: Diagnosis not present

## 2021-09-10 DIAGNOSIS — I69351 Hemiplegia and hemiparesis following cerebral infarction affecting right dominant side: Secondary | ICD-10-CM | POA: Diagnosis not present

## 2021-09-10 DIAGNOSIS — R2681 Unsteadiness on feet: Secondary | ICD-10-CM | POA: Diagnosis not present

## 2021-09-10 DIAGNOSIS — G463 Brain stem stroke syndrome: Secondary | ICD-10-CM | POA: Diagnosis not present

## 2021-09-10 DIAGNOSIS — M6281 Muscle weakness (generalized): Secondary | ICD-10-CM | POA: Diagnosis not present

## 2021-09-10 DIAGNOSIS — F411 Generalized anxiety disorder: Secondary | ICD-10-CM | POA: Diagnosis not present

## 2021-09-10 DIAGNOSIS — I63541 Cerebral infarction due to unspecified occlusion or stenosis of right cerebellar artery: Secondary | ICD-10-CM | POA: Diagnosis not present

## 2021-09-10 DIAGNOSIS — M159 Polyosteoarthritis, unspecified: Secondary | ICD-10-CM | POA: Diagnosis not present

## 2021-09-10 NOTE — Progress Notes (Signed)
?Location:  [facility] Room Number: 111-W ?Place of Service:  SNF (31) ? ? ?CODE STATUS: DNR ? ?No Known Allergies ? ?Chief Complaint  ?Patient presents with  ? Acute Visit  ?  Care plan meeting  ? ? ?HPI: ? ?We have come together for his care plan meeting. BIMS 13/15; mood 6/30: decreased energy; decreased energy and not sleeping well. He is nonambulatory no recent falls. He requires limited to extensive assist with his adls care. He is incontinent of bladder and bowel. Dietary: requires extensive assist with eating. Regular diet with a good appetite; weight is 186.4 pounds up 7 pounds over the past quarter. He continues to be followed for his chronic illnesses:   Aortic atherosclerosis  CKD stage 3 due to type 2 diabetes mellitus   Severe vascular dementia without behavioral disturbance; with psychotic disturbance; without mood disturbance without mood.   Mild protein  calorie malnutrition  ? ?Past Medical History:  ?Diagnosis Date  ? Hypertension   ? Benign Essential  ? Hypothyroidism   ? Obesity   ? Osteoarthritis   ? PVD (peripheral vascular disease) ([place])   ? Type 2 diabetes mellitus ([place])   ? ? ?Past Surgical History:  ?Procedure Laterality Date  ? APPENDECTOMY    ? TONSILLECTOMY    ? ? ?Social History  ? ?Socioeconomic History  ? Marital status: Single  ?  Spouse name: Not on file  ? Number of children: Not on file  ? Years of education: 28  ? Highest education level: Not on file  ?Occupational History  ? Occupation: RETIRED  ?Tobacco Use  ? Smoking status: Former  ? Smokeless tobacco: Never  ?Vaping Use  ? Vaping Use: Never used  ?Substance and Sexual Activity  ? Alcohol use: Not Currently  ? Drug use: Never  ? Sexual activity: Not Currently  ?Other Topics Concern  ? Not on file  ?Social History Narrative  ? Not on file  ? ?Social Determinants of Health  ? ?Financial Resource Strain: Not on file  ?Food Insecurity: Not on file  ?Transportation Needs: Not on file  ?Physical Activity:  Not on file  ?Stress: Not on file  ?Social Connections: Not on file  ?Intimate Partner Violence: Not on file  ? ?Family History  ?Problem Relation Age of Onset  ? Osteoporosis Mother   ? Hypertension Mother   ? Cancer Mother   ? Heart attack Father   ? Heart attack Maternal Grandmother   ? Pneumonia Maternal Grandfather   ? Heart disease Paternal Grandmother   ? Heart disease Paternal Grandfather   ? ? ? ? ?VITAL SIGNS ?BP (!) 98/53   Pulse 68   Temp 97.6 ?F (36.4 ?C)   Ht _0  (1.803 m)   Wt 146 lb (66.2 kg)   BMI 20.36 kg/m?  ? ?Outpatient Encounter Medications as of 09/10/2021  ?Medication Sig  ? aspirin EC 81 MG tablet Take 162 mg by mouth daily. Swallow whole.  ? atorvastatin (LIPITOR) 40 MG tablet Take 40 mg by mouth daily.  ? escitalopram (LEXAPRO) 10 MG tablet Take 10 mg by mouth daily.  ? levothyroxine (SYNTHROID) 75 MCG tablet Take 75 mcg by mouth daily before breakfast.  ? memantine (NAMENDA XR) 28 MG CP24 24 hr capsule Take 28 mg by mouth daily. 9 am  ? miconazole (ZEASORB-AF) 2 % powder Apply 1 application topically as needed for itching (to groin and scrotum area substituted with shower to).  ? NON FORMULARY  Diet Change: Dysphagia 3 diet with dysphagia 2 meats, thin liquids (NAS, ConCho)  ? omeprazole (PRILOSEC OTC) 20 MG tablet Take 20 mg by mouth at bedtime.  ? ondansetron (ZOFRAN-ODT) 8 MG disintegrating tablet Take 8 mg by mouth daily as needed.  ? senna-docusate (SENOKOT-S) 8.6-50 MG tablet Take 2 tablets by mouth 2 (two) times daily as needed. hold for diarrhea  ? sitaGLIPtin (JANUVIA) 50 MG tablet Take 50 mg by mouth daily.  ? triamcinolone cream (KENALOG) 0.1 % Apply 1 application topically 2 (two) times daily. Apply to all areas of psoriasis  ? ?No facility-administered encounter medications on file as of 09/10/2021.  ? ? ? ?SIGNIFICANT DIAGNOSTIC EXAMS ? ?PREVIOUS ? ?01-18-21: ct of head:  ?Brain: Mild chronic ischemic white matter disease is noted. Mild diffuse cortical atrophy is noted.  No mass effect or midline shift is noted. Ventricular size is within normal limits. There is no evidence of mass lesion, hemorrhage or acute infarction. ?Vascular: No hyperdense vessel or unexpected calcification. ?Skull: Normal. Negative for fracture or focal lesion. ?Sinuses/Orbits: No acute finding. ? ?01-18-21: MRI/MRA of brain ?MRI: ?1. Acute infarct in the posterolateral right medulla, likely right PICA territory. Additional acute right PCA territory infarcts in the right occipital lobe and posterior right corpus callosum. Associated mild edema without mass effect. ?2. Developing encephalomalacia in the areas of previously seen infarct in the inferior right cerebellum. ?3. Remote lacunar infarcts in the left thalamus, right caudate, and bilateral corona radiata. ?4. Moderate chronic microvascular disease. ?MRA: ?1. Severe stenosis of bilateral P2 PCAs with poor flow related signal in the more distal PCAs. Also, severe stenosis of the distal left M1 MCA, bilateral M2 MCA branches and bilateral A2 ACAs. ?2. Similar multifocal moderate narrowing of the distal right intradural vertebral artery. Similar flow related signal in the proximal right PICA, but not the more distal right PICA. ? ?01-19-21: 2-d echo:  ?1. Left ventricular ejection fraction, by estimation, is 70 to 75%. The  left ventricle has hyperdynamic function. The left ventricle has no  regional wall motion abnormalities. There is mild left ventricular  ?hypertrophy. Left ventricular diastolic  parameters are indeterminate.  ? ?01-28-21: chest x-ray:  ?1.  Prior CABG.  Heart size normal. ?2. Mild left base subsegmental atelectasis. Tiny left pleural effusion cannot be excluded.  ?3. Right posterior eighth rib fracture. This may be acute. No pneumothorax. ? ?01-28-21: KUB ?1. Nonobstructive bowel gas pattern. ?2. Prominent colonic stool volume. Imaging appearance raises the question of chronic constipation. ? ?01-28-21: EKG ?Sinus rhythm with first degree AV  block; PAC: R BBB ? ?NO NEW EXAMS.  ? ?LABS REVIEWED: PREVIOUS  ? ?01-18-21: wbc 13.5; hgb 13.2; hct 40.5; mcv 96.4 plt 377; glucose 260; bun 22; creat 1.19; k+ 4.9; na++ 137; ca 9.6 GFR>60; alk phos 161;  albumin 3.6; urine culture: staphylococcus aureus ?01-20-21: wbc 11.6; hgb 12.8; hct 39.0; mcv 96.5 plt 333; glucose 207; bun 26; creat 1.22; k+ 5.1; na++ 139; ca 9.2 GFR>60; hgb a1c 8.3; chol 217; LDL 150; trig 151; hdl 37 ?01-21-21: wbc 13.5; hgb 13.6; hct 41.6; mcv 95.9 plt 390; glucose 243; bun 31;creat 1.28; k+ 5.1; na++ 137; ca 9.5 GFR 59; alk phos 134; albumin 3.2 mag 1.8  ? 01-27-21: wbc 20.8; hgb 14.6; hct 43.3; mcv 92.7 pt 468; glucose 414; bun 41; creat 1.58; k+ 5.6; na++ 131; ca 9.5; GFR 45; alk phos 142; albumin 3.4 troponin #1: 10; #2: 13 ?01-28-21: wbc 17.3; hgb 13.5; hct 40.4; mcv  94.0 plt 437; glucose 292; bun 42; creat 1.39; k+ 4.9; na++ 130; ca 9.2 GFR 52; troponin #3 12. Mag 1.8  ?01-29-21: guaiac positive ?02-02-21: guaiac negative ?02-05-21: wbc 11.4; hgb 12.9; hct 39.6; mcv 97.3 plt 397; glucose 194; bun 17; creat 1.13; k+ 4.9; na++ 137; ca 8.4; GFR>60 ?03-12-21: wbc 9.9; hgb 13.1; hct 39.8; mcv 95.4 plt 296; glucose 147; bun 23; creat 1.25; k+ 4.5; na++ 140; ca 9.0; GFR 59; liver normal albumin 3.3 ?03-17-21: tsh 2.566 ?04-08-21: chol 125; ldl 58; trig 213; hdl 24; hgb a1c 6.1; liver normal albumin 3.5;  ?04-27-21: urine micro albumin 7.2  ? ?NO NEW LABS.  ? ?Review of Systems  ?Constitutional:  Negative for malaise/fatigue.  ?Respiratory:  Negative for cough and shortness of breath.   ?Cardiovascular:  Negative for chest pain, palpitations and leg swelling.  ?Gastrointestinal:  Negative for abdominal pain, constipation and heartburn.  ?Musculoskeletal:  Negative for back pain, joint pain and myalgias.  ?Skin: Negative.   ?Neurological:  Negative for dizziness.  ?Psychiatric/Behavioral:  The patient is not nervous/anxious.   ? ?Physical Exam ?Constitutional:   ?   General: He is not in acute distress. ?    Appearance: He is well-developed. He is not diaphoretic.  ?Neck:  ?   Thyroid: No thyromegaly.  ?Cardiovascular:  ?   Rate and Rhythm: Normal rate and regular rhythm.  ?   Pulses: Normal pulses.  ?   Heart

## 2021-09-13 DIAGNOSIS — I63541 Cerebral infarction due to unspecified occlusion or stenosis of right cerebellar artery: Secondary | ICD-10-CM | POA: Diagnosis not present

## 2021-09-13 DIAGNOSIS — I69351 Hemiplegia and hemiparesis following cerebral infarction affecting right dominant side: Secondary | ICD-10-CM | POA: Diagnosis not present

## 2021-09-13 DIAGNOSIS — M159 Polyosteoarthritis, unspecified: Secondary | ICD-10-CM | POA: Diagnosis not present

## 2021-09-13 DIAGNOSIS — G463 Brain stem stroke syndrome: Secondary | ICD-10-CM | POA: Diagnosis not present

## 2021-09-13 DIAGNOSIS — R2681 Unsteadiness on feet: Secondary | ICD-10-CM | POA: Diagnosis not present

## 2021-09-13 DIAGNOSIS — M6281 Muscle weakness (generalized): Secondary | ICD-10-CM | POA: Diagnosis not present

## 2021-09-14 DIAGNOSIS — G463 Brain stem stroke syndrome: Secondary | ICD-10-CM | POA: Diagnosis not present

## 2021-09-14 DIAGNOSIS — I63541 Cerebral infarction due to unspecified occlusion or stenosis of right cerebellar artery: Secondary | ICD-10-CM | POA: Diagnosis not present

## 2021-09-14 DIAGNOSIS — R2681 Unsteadiness on feet: Secondary | ICD-10-CM | POA: Diagnosis not present

## 2021-09-14 DIAGNOSIS — M159 Polyosteoarthritis, unspecified: Secondary | ICD-10-CM | POA: Diagnosis not present

## 2021-09-14 DIAGNOSIS — I69351 Hemiplegia and hemiparesis following cerebral infarction affecting right dominant side: Secondary | ICD-10-CM | POA: Diagnosis not present

## 2021-09-14 DIAGNOSIS — M6281 Muscle weakness (generalized): Secondary | ICD-10-CM | POA: Diagnosis not present

## 2021-09-15 DIAGNOSIS — G463 Brain stem stroke syndrome: Secondary | ICD-10-CM | POA: Diagnosis not present

## 2021-09-15 DIAGNOSIS — M159 Polyosteoarthritis, unspecified: Secondary | ICD-10-CM | POA: Diagnosis not present

## 2021-09-15 DIAGNOSIS — I69351 Hemiplegia and hemiparesis following cerebral infarction affecting right dominant side: Secondary | ICD-10-CM | POA: Diagnosis not present

## 2021-09-15 DIAGNOSIS — M6281 Muscle weakness (generalized): Secondary | ICD-10-CM | POA: Diagnosis not present

## 2021-09-15 DIAGNOSIS — I63541 Cerebral infarction due to unspecified occlusion or stenosis of right cerebellar artery: Secondary | ICD-10-CM | POA: Diagnosis not present

## 2021-09-15 DIAGNOSIS — R2681 Unsteadiness on feet: Secondary | ICD-10-CM | POA: Diagnosis not present

## 2021-09-16 DIAGNOSIS — G463 Brain stem stroke syndrome: Secondary | ICD-10-CM | POA: Diagnosis not present

## 2021-09-16 DIAGNOSIS — I63541 Cerebral infarction due to unspecified occlusion or stenosis of right cerebellar artery: Secondary | ICD-10-CM | POA: Diagnosis not present

## 2021-09-16 DIAGNOSIS — M159 Polyosteoarthritis, unspecified: Secondary | ICD-10-CM | POA: Diagnosis not present

## 2021-09-16 DIAGNOSIS — I69351 Hemiplegia and hemiparesis following cerebral infarction affecting right dominant side: Secondary | ICD-10-CM | POA: Diagnosis not present

## 2021-09-16 DIAGNOSIS — M6281 Muscle weakness (generalized): Secondary | ICD-10-CM | POA: Diagnosis not present

## 2021-09-16 DIAGNOSIS — R2681 Unsteadiness on feet: Secondary | ICD-10-CM | POA: Diagnosis not present

## 2021-09-17 DIAGNOSIS — G463 Brain stem stroke syndrome: Secondary | ICD-10-CM | POA: Diagnosis not present

## 2021-09-17 DIAGNOSIS — R2681 Unsteadiness on feet: Secondary | ICD-10-CM | POA: Diagnosis not present

## 2021-09-17 DIAGNOSIS — M159 Polyosteoarthritis, unspecified: Secondary | ICD-10-CM | POA: Diagnosis not present

## 2021-09-17 DIAGNOSIS — I63541 Cerebral infarction due to unspecified occlusion or stenosis of right cerebellar artery: Secondary | ICD-10-CM | POA: Diagnosis not present

## 2021-09-17 DIAGNOSIS — I69351 Hemiplegia and hemiparesis following cerebral infarction affecting right dominant side: Secondary | ICD-10-CM | POA: Diagnosis not present

## 2021-09-17 DIAGNOSIS — M6281 Muscle weakness (generalized): Secondary | ICD-10-CM | POA: Diagnosis not present

## 2021-09-17 DIAGNOSIS — F411 Generalized anxiety disorder: Secondary | ICD-10-CM | POA: Diagnosis not present

## 2021-09-20 DIAGNOSIS — M159 Polyosteoarthritis, unspecified: Secondary | ICD-10-CM | POA: Diagnosis not present

## 2021-09-20 DIAGNOSIS — R2681 Unsteadiness on feet: Secondary | ICD-10-CM | POA: Diagnosis not present

## 2021-09-20 DIAGNOSIS — G463 Brain stem stroke syndrome: Secondary | ICD-10-CM | POA: Diagnosis not present

## 2021-09-20 DIAGNOSIS — I69351 Hemiplegia and hemiparesis following cerebral infarction affecting right dominant side: Secondary | ICD-10-CM | POA: Diagnosis not present

## 2021-09-20 DIAGNOSIS — I63541 Cerebral infarction due to unspecified occlusion or stenosis of right cerebellar artery: Secondary | ICD-10-CM | POA: Diagnosis not present

## 2021-09-20 DIAGNOSIS — M6281 Muscle weakness (generalized): Secondary | ICD-10-CM | POA: Diagnosis not present

## 2021-09-21 DIAGNOSIS — I69351 Hemiplegia and hemiparesis following cerebral infarction affecting right dominant side: Secondary | ICD-10-CM | POA: Diagnosis not present

## 2021-09-21 DIAGNOSIS — I63541 Cerebral infarction due to unspecified occlusion or stenosis of right cerebellar artery: Secondary | ICD-10-CM | POA: Diagnosis not present

## 2021-09-21 DIAGNOSIS — F411 Generalized anxiety disorder: Secondary | ICD-10-CM | POA: Diagnosis not present

## 2021-09-21 DIAGNOSIS — R2681 Unsteadiness on feet: Secondary | ICD-10-CM | POA: Diagnosis not present

## 2021-09-21 DIAGNOSIS — M6281 Muscle weakness (generalized): Secondary | ICD-10-CM | POA: Diagnosis not present

## 2021-09-21 DIAGNOSIS — M159 Polyosteoarthritis, unspecified: Secondary | ICD-10-CM | POA: Diagnosis not present

## 2021-09-21 DIAGNOSIS — G463 Brain stem stroke syndrome: Secondary | ICD-10-CM | POA: Diagnosis not present

## 2021-09-22 DIAGNOSIS — M6281 Muscle weakness (generalized): Secondary | ICD-10-CM | POA: Diagnosis not present

## 2021-09-22 DIAGNOSIS — F411 Generalized anxiety disorder: Secondary | ICD-10-CM | POA: Diagnosis not present

## 2021-09-22 DIAGNOSIS — I69351 Hemiplegia and hemiparesis following cerebral infarction affecting right dominant side: Secondary | ICD-10-CM | POA: Diagnosis not present

## 2021-09-22 DIAGNOSIS — I63541 Cerebral infarction due to unspecified occlusion or stenosis of right cerebellar artery: Secondary | ICD-10-CM | POA: Diagnosis not present

## 2021-09-22 DIAGNOSIS — M159 Polyosteoarthritis, unspecified: Secondary | ICD-10-CM | POA: Diagnosis not present

## 2021-09-22 DIAGNOSIS — R2681 Unsteadiness on feet: Secondary | ICD-10-CM | POA: Diagnosis not present

## 2021-09-22 DIAGNOSIS — G463 Brain stem stroke syndrome: Secondary | ICD-10-CM | POA: Diagnosis not present

## 2021-09-22 DIAGNOSIS — F01A4 Vascular dementia, mild, with anxiety: Secondary | ICD-10-CM | POA: Diagnosis not present

## 2021-09-23 DIAGNOSIS — I69351 Hemiplegia and hemiparesis following cerebral infarction affecting right dominant side: Secondary | ICD-10-CM | POA: Diagnosis not present

## 2021-09-23 DIAGNOSIS — I63541 Cerebral infarction due to unspecified occlusion or stenosis of right cerebellar artery: Secondary | ICD-10-CM | POA: Diagnosis not present

## 2021-09-23 DIAGNOSIS — M159 Polyosteoarthritis, unspecified: Secondary | ICD-10-CM | POA: Diagnosis not present

## 2021-09-23 DIAGNOSIS — M6281 Muscle weakness (generalized): Secondary | ICD-10-CM | POA: Diagnosis not present

## 2021-09-23 DIAGNOSIS — R2681 Unsteadiness on feet: Secondary | ICD-10-CM | POA: Diagnosis not present

## 2021-09-23 DIAGNOSIS — G463 Brain stem stroke syndrome: Secondary | ICD-10-CM | POA: Diagnosis not present

## 2021-10-01 ENCOUNTER — Encounter (HOSPITAL_COMMUNITY)
Admission: RE | Admit: 2021-10-01 | Discharge: 2021-10-01 | Disposition: A | Discharge status: Home / Self Care | Payer: Medicare Other | Source: Skilled Nursing Facility | Attending: Internal Medicine | Admitting: Internal Medicine

## 2021-10-01 DIAGNOSIS — U071 COVID-19: Secondary | ICD-10-CM | POA: Diagnosis not present

## 2021-10-01 LAB — BASIC METABOLIC PANEL
Anion gap: 4 — ABNORMAL LOW (ref 5–15)
BUN: 31 mg/dL — ABNORMAL HIGH (ref 8–23)
CO2: 26 mmol/L (ref 22–32)
Calcium: 8.6 mg/dL — ABNORMAL LOW (ref 8.9–10.3)
Chloride: 108 mmol/L (ref 98–111)
Creatinine, Ser: 1.44 mg/dL — ABNORMAL HIGH (ref 0.61–1.24)
GFR, Estimated: 50 mL/min — ABNORMAL LOW (ref >60)
Glucose, Bld: 107 mg/dL — ABNORMAL HIGH (ref 70–99)
Potassium: 4.5 mmol/L (ref 3.5–5.1)
Sodium: 138 mmol/L (ref 135–145)

## 2021-10-01 LAB — CBC
HCT: 34.8 % — ABNORMAL LOW (ref 39.0–52.0)
Hemoglobin: 11.8 g/dL — ABNORMAL LOW (ref 13.0–17.0)
MCH: 32.3 pg (ref 26.0–34.0)
MCHC: 33.9 g/dL (ref 30.0–36.0)
MCV: 95.3 fL (ref 80.0–100.0)
Platelets: 185 10*3/uL (ref 150–400)
RBC: 3.65 MIL/uL — ABNORMAL LOW (ref 4.22–5.81)
RDW: 13.2 % (ref 11.5–15.5)
WBC: 9.3 10*3/uL (ref 4.0–10.5)
nRBC: 0 % (ref 0.0–0.2)

## 2021-10-01 LAB — D-DIMER, QUANTITATIVE: D-Dimer, Quant: 1.44 ug/mL-FEU — ABNORMAL HIGH (ref 0.00–0.50)

## 2021-10-02 LAB — C-REACTIVE PROTEIN: CRP: 0.8 mg/dL (ref <1.0)

## 2021-10-05 ENCOUNTER — Non-Acute Institutional Stay (SKILLED_NURSING_FACILITY): Payer: Medicare Other | Admitting: Adult Health

## 2021-10-05 ENCOUNTER — Encounter: Payer: Self-pay | Admitting: Adult Health

## 2021-10-05 DIAGNOSIS — D6869 Other thrombophilia: Secondary | ICD-10-CM | POA: Diagnosis not present

## 2021-10-05 DIAGNOSIS — E119 Type 2 diabetes mellitus without complications: Secondary | ICD-10-CM | POA: Diagnosis not present

## 2021-10-05 DIAGNOSIS — U071 COVID-19: Secondary | ICD-10-CM | POA: Diagnosis not present

## 2021-10-05 NOTE — Progress Notes (Signed)
? ?Location:  [facility] Room Number: 111 ?Place of Service:  SNF (31) ? ? ?CODE STATUS: dnr  ? ?No Known Allergies ? ?Chief Complaint  ?Patient presents with  ? Acute Visit  ?  Covid positive   ? ? ?HPI: ? ?He has tested positive for covid. There are no reports of fevers; on reports of sore throat. He did have a choking episode on Saturday. He does have some sinus congestion no hoarseness. His d-dimer is elevated 1.44  ? ?Past Medical History:  ?Diagnosis Date  ? Hypertension   ? Benign Essential  ? Hypothyroidism   ? Obesity   ? Osteoarthritis   ? PVD (peripheral vascular disease) ([place])   ? Type 2 diabetes mellitus ([place])   ? ? ?Past Surgical History:  ?Procedure Laterality Date  ? APPENDECTOMY    ? TONSILLECTOMY    ? ? ?Social History  ? ?Socioeconomic History  ? Marital status: Single  ?  Spouse name: Not on file  ? Number of children: Not on file  ? Years of education: 48  ? Highest education level: Not on file  ?Occupational History  ? Occupation: RETIRED  ?Tobacco Use  ? Smoking status: Former  ? Smokeless tobacco: Never  ?Vaping Use  ? Vaping Use: Never used  ?Substance and Sexual Activity  ? Alcohol use: Not Currently  ? Drug use: Never  ? Sexual activity: Not Currently  ?Other Topics Concern  ? Not on file  ?Social History Narrative  ? Not on file  ? ?Social Determinants of Health  ? ?Financial Resource Strain: Not on file  ?Food Insecurity: Not on file  ?Transportation Needs: Not on file  ?Physical Activity: Not on file  ?Stress: Not on file  ?Social Connections: Not on file  ?Intimate Partner Violence: Not on file  ? ?Family History  ?Problem Relation Age of Onset  ? Osteoporosis Mother   ? Hypertension Mother   ? Cancer Mother   ? Heart attack Father   ? Heart attack Maternal Grandmother   ? Pneumonia Maternal Grandfather   ? Heart disease Paternal Grandmother   ? Heart disease Paternal Grandfather   ? ? ? ? ?VITAL SIGNS ?BP (!) 86/50   Pulse 62   Temp (!) 97.4 ?F (36.3 ?C)    Resp 20   Ht 5' 11"  (1.803 m)   Wt 146 lb (66.2 kg)   SpO2 95%   BMI 20.36 kg/m?  ? ?Outpatient Encounter Medications as of 10/05/2021  ?Medication Sig  ? aspirin EC 81 MG tablet Take 162 mg by mouth daily. Swallow whole.  ? atorvastatin (LIPITOR) 40 MG tablet Take 40 mg by mouth daily.  ? escitalopram (LEXAPRO) 10 MG tablet Take 10 mg by mouth daily.  ? levothyroxine (SYNTHROID) 75 MCG tablet Take 75 mcg by mouth daily before breakfast.  ? memantine (NAMENDA XR) 28 MG CP24 24 hr capsule Take 28 mg by mouth daily. 9 am  ? miconazole (ZEASORB-AF) 2 % powder Apply 1 application topically as needed for itching (to groin and scrotum area substituted with shower to).  ? NON FORMULARY Diet Change: Dysphagia 3 diet with dysphagia 2 meats, thin liquids (NAS, ConCho)  ? omeprazole (PRILOSEC OTC) 20 MG tablet Take 20 mg by mouth at bedtime.  ? ondansetron (ZOFRAN-ODT) 8 MG disintegrating tablet Take 8 mg by mouth daily as needed.  ? senna-docusate (SENOKOT-S) 8.6-50 MG tablet Take 2 tablets by mouth 2 (two) times daily as needed. hold for  diarrhea  ? sitaGLIPtin (JANUVIA) 50 MG tablet Take 50 mg by mouth daily.  ? triamcinolone cream (KENALOG) 0.1 % Apply 1 application topically 2 (two) times daily. Apply to all areas of psoriasis  ? ?No facility-administered encounter medications on file as of 10/05/2021.  ? ? ? ?SIGNIFICANT DIAGNOSTIC EXAMS ? ?PREVIOUS ? ?01-18-21: ct of head:  ?Brain: Mild chronic ischemic white matter disease is noted. Mild diffuse cortical atrophy is noted. No mass effect or midline shift is noted. Ventricular size is within normal limits. There is no evidence of mass lesion, hemorrhage or acute infarction. ?Vascular: No hyperdense vessel or unexpected calcification. ?Skull: Normal. Negative for fracture or focal lesion. ?Sinuses/Orbits: No acute finding. ? ?01-18-21: MRI/MRA of brain ?MRI: ?1. Acute infarct in the posterolateral right medulla, likely right PICA territory. Additional acute right PCA  territory infarcts in the right occipital lobe and posterior right corpus callosum. Associated mild edema without mass effect. ?2. Developing encephalomalacia in the areas of previously seen infarct in the inferior right cerebellum. ?3. Remote lacunar infarcts in the left thalamus, right caudate, and bilateral corona radiata. ?4. Moderate chronic microvascular disease. ?MRA: ?1. Severe stenosis of bilateral P2 PCAs with poor flow related signal in the more distal PCAs. Also, severe stenosis of the distal left M1 MCA, bilateral M2 MCA branches and bilateral A2 ACAs. ?2. Similar multifocal moderate narrowing of the distal right intradural vertebral artery. Similar flow related signal in the proximal right PICA, but not the more distal right PICA. ? ?01-19-21: 2-d echo:  ?1. Left ventricular ejection fraction, by estimation, is 70 to 75%. The  left ventricle has hyperdynamic function. The left ventricle has no  regional wall motion abnormalities. There is mild left ventricular  ?hypertrophy. Left ventricular diastolic  parameters are indeterminate.  ? ?01-28-21: chest x-ray:  ?1.  Prior CABG.  Heart size normal. ?2. Mild left base subsegmental atelectasis. Tiny left pleural effusion cannot be excluded.  ?3. Right posterior eighth rib fracture. This may be acute. No pneumothorax. ? ?01-28-21: KUB ?1. Nonobstructive bowel gas pattern. ?2. Prominent colonic stool volume. Imaging appearance raises the question of chronic constipation. ? ?01-28-21: EKG ?Sinus rhythm with first degree AV block; PAC: R BBB ? ?TODAY ? ?10-01-21: chest x-ray: no infiltrate; no effusion; no acuate findings.   ? ?LABS REVIEWED: PREVIOUS  ? ?01-18-21: wbc 13.5; hgb 13.2; hct 40.5; mcv 96.4 plt 377; glucose 260; bun 22; creat 1.19; k+ 4.9; na++ 137; ca 9.6 GFR>60; alk phos 161;  albumin 3.6; urine culture: staphylococcus aureus ?01-20-21: wbc 11.6; hgb 12.8; hct 39.0; mcv 96.5 plt 333; glucose 207; bun 26; creat 1.22; k+ 5.1; na++ 139; ca 9.2 GFR>60; hgb a1c  8.3; chol 217; LDL 150; trig 151; hdl 37 ?01-21-21: wbc 13.5; hgb 13.6; hct 41.6; mcv 95.9 plt 390; glucose 243; bun 31;creat 1.28; k+ 5.1; na++ 137; ca 9.5 GFR 59; alk phos 134; albumin 3.2 mag 1.8  ? 01-27-21: wbc 20.8; hgb 14.6; hct 43.3; mcv 92.7 pt 468; glucose 414; bun 41; creat 1.58; k+ 5.6; na++ 131; ca 9.5; GFR 45; alk phos 142; albumin 3.4 troponin #1: 10; #2: 13 ?01-28-21: wbc 17.3; hgb 13.5; hct 40.4; mcv 94.0 plt 437; glucose 292; bun 42; creat 1.39; k+ 4.9; na++ 130; ca 9.2 GFR 52; troponin #3 12. Mag 1.8  ?01-29-21: guaiac positive ?02-02-21: guaiac negative ?02-05-21: wbc 11.4; hgb 12.9; hct 39.6; mcv 97.3 plt 397; glucose 194; bun 17; creat 1.13; k+ 4.9; na++ 137; ca 8.4; GFR>60 ?03-12-21:  wbc 9.9; hgb 13.1; hct 39.8; mcv 95.4 plt 296; glucose 147; bun 23; creat 1.25; k+ 4.5; na++ 140; ca 9.0; GFR 59; liver normal albumin 3.3 ?03-17-21: tsh 2.566 ?04-08-21: chol 125; ldl 58; trig 213; hdl 24; hgb a1c 6.1; liver normal albumin 3.5;  ?04-27-21: urine micro albumin 7.2  ? ?TODAY ? ?10-01-21: wbc 9.3; hgb 11.8; hct 34.8; mcv 95.3 plt 185; glucose 107; bun 31; creat 1.44; k+ 4.5; na++ 138; ca 8.6; GFR 50; d-dimer 1.44 CRP 0.8   ? ? ?Review of Systems  ?Constitutional:  Negative for malaise/fatigue.  ?Respiratory:  Negative for cough and shortness of breath.   ?Cardiovascular:  Negative for chest pain, palpitations and leg swelling.  ?Gastrointestinal:  Negative for abdominal pain, constipation and heartburn.  ?Musculoskeletal:  Negative for back pain, joint pain and myalgias.  ?Skin: Negative.   ?Neurological:  Negative for dizziness.  ?Psychiatric/Behavioral:  The patient is not nervous/anxious.   ? ?Physical Exam ?Constitutional:   ?   General: He is not in acute distress. ?   Appearance: He is well-developed. He is not diaphoretic.  ?Neck:  ?   Thyroid: No thyromegaly.  ?Cardiovascular:  ?   Rate and Rhythm: Normal rate and regular rhythm.  ?   Pulses: Normal pulses.  ?   Heart sounds: Normal heart sounds.   ?Pulmonary:  ?   Effort: Pulmonary effort is normal. No respiratory distress.  ?   Breath sounds: Normal breath sounds.  ?Abdominal:  ?   General: Bowel sounds are normal. There is no distension.  ?   Palpations: Abdomen

## 2021-10-07 ENCOUNTER — Non-Acute Institutional Stay (SKILLED_NURSING_FACILITY): Payer: Medicare Other | Admitting: Adult Health

## 2021-10-07 ENCOUNTER — Encounter: Payer: Self-pay | Admitting: Adult Health

## 2021-10-07 DIAGNOSIS — K5909 Other constipation: Secondary | ICD-10-CM | POA: Diagnosis not present

## 2021-10-07 DIAGNOSIS — Z8673 Personal history of transient ischemic attack (TIA), and cerebral infarction without residual deficits: Secondary | ICD-10-CM

## 2021-10-07 DIAGNOSIS — E079 Disorder of thyroid, unspecified: Secondary | ICD-10-CM

## 2021-10-07 NOTE — Progress Notes (Signed)
?Location:  [place] ?Nursing Home Room Number: 122-D ?Place of Service:  SNF (31) ? ? ?CODE STATUS: DNR ? ?No Known Allergies ? ?Chief Complaint  ?Patient presents with  ? Medical Management of Chronic Issues  ?            Chronic constipation:  History of stroke: Thyroid disease  ? ? ?HPI: ? ?He is a 76 year old long term resident of this facility being seen for the management of her chronic illnesses:  Chronic constipation:  History of stroke: Thyroid disease. There are no reports of uncontrolled pain. No reports of changes in appetite; weight is stable. He has been treated for covid this past month.  ? ?Past Medical History:  ?Diagnosis Date  ? Hypertension   ? Benign Essential  ? Hypothyroidism   ? Obesity   ? Osteoarthritis   ? PVD (peripheral vascular disease) ([place])   ? Type 2 diabetes mellitus ([place])   ? ? ?Past Surgical History:  ?Procedure Laterality Date  ? APPENDECTOMY    ? TONSILLECTOMY    ? ? ?Social History  ? ?Socioeconomic History  ? Marital status: Single  ?  Spouse name: Not on file  ? Number of children: Not on file  ? Years of education: 74  ? Highest education level: Not on file  ?Occupational History  ? Occupation: RETIRED  ?Tobacco Use  ? Smoking status: Former  ? Smokeless tobacco: Never  ?Vaping Use  ? Vaping Use: Never used  ?Substance and Sexual Activity  ? Alcohol use: Not Currently  ? Drug use: Never  ? Sexual activity: Not Currently  ?Other Topics Concern  ? Not on file  ?Social History Narrative  ? Not on file  ? ?Social Determinants of Health  ? ?Financial Resource Strain: Not on file  ?Food Insecurity: Not on file  ?Transportation Needs: Not on file  ?Physical Activity: Not on file  ?Stress: Not on file  ?Social Connections: Not on file  ?Intimate Partner Violence: Not on file  ? ?Family History  ?Problem Relation Age of Onset  ? Osteoporosis Mother   ? Hypertension Mother   ? Cancer Mother   ? Heart attack Father   ? Heart attack Maternal Grandmother   ? Pneumonia  Maternal Grandfather   ? Heart disease Paternal Grandmother   ? Heart disease Paternal Grandfather   ? ? ? ? ?VITAL SIGNS ?BP 120/62   Pulse 71   Temp (!) 97.1 ?F (36.2 ?C)   Resp 20   Ht _0  (1.803 m)   Wt 146 lb (66.2 kg)   SpO2 96%   BMI 20.36 kg/m?  ? ?Outpatient Encounter Medications as of 10/07/2021  ?Medication Sig  ? apixaban (ELIQUIS) 2.5 MG TABS tablet Take 2.5 mg by mouth 2 (two) times daily.  ? aspirin EC 81 MG tablet Take 162 mg by mouth daily. Swallow whole.  ? atorvastatin (LIPITOR) 40 MG tablet Take 40 mg by mouth daily.  ? escitalopram (LEXAPRO) 10 MG tablet Take 10 mg by mouth daily.  ? levothyroxine (SYNTHROID) 75 MCG tablet Take 75 mcg by mouth daily before breakfast.  ? memantine (NAMENDA XR) 28 MG CP24 24 hr capsule Take 28 mg by mouth daily. 9 am  ? miconazole (ZEASORB-AF) 2 % powder Apply 1 application topically as needed for itching (to groin and scrotum area substituted with shower to).  ? NON FORMULARY Diet Change: Dysphagia 3 diet with dysphagia 2 meats, thin liquids (NAS, [employer])  ?  omeprazole (PRILOSEC OTC) 20 MG tablet Take 20 mg by mouth at bedtime.  ? ondansetron (ZOFRAN-ODT) 8 MG disintegrating tablet Take 8 mg by mouth daily as needed.  ? senna-docusate (SENOKOT-S) 8.6-50 MG tablet Take 2 tablets by mouth 2 (two) times daily as needed. hold for diarrhea  ? sitaGLIPtin (JANUVIA) 50 MG tablet Take 50 mg by mouth daily.  ? triamcinolone cream (KENALOG) 0.1 % Apply 1 application topically 2 (two) times daily. Apply to all areas of psoriasis  ? vitamin C (ASCORBIC ACID) 500 MG tablet Take 500 mg by mouth 2 (two) times daily.  ? Vitamin D, Ergocalciferol, (DRISDOL) 1.25 MG (50000 UNIT) CAPS capsule Take 50,000 Units by mouth every 7 (seven) days.  ? Zinc-Vitamin C (ZINC-A-COLD/VITAMIN C MT) Use as directed 1 tablet in the mouth or throat 5 (five) times daily.  ? ?No facility-administered encounter medications on file as of 10/07/2021.  ? ? ? ?SIGNIFICANT DIAGNOSTIC  EXAMS ? ? ?PREVIOUS ? ?01-18-21: ct of head:  ?Brain: Mild chronic ischemic white matter disease is noted. Mild diffuse cortical atrophy is noted. No mass effect or midline shift is noted. Ventricular size is within normal limits. There is no evidence of mass lesion, hemorrhage or acute infarction. ?Vascular: No hyperdense vessel or unexpected calcification. ?Skull: Normal. Negative for fracture or focal lesion. ?Sinuses/Orbits: No acute finding. ? ?01-18-21: MRI/MRA of brain ?MRI: ?1. Acute infarct in the posterolateral right medulla, likely right PICA territory. Additional acute right PCA territory infarcts in the right occipital lobe and posterior right corpus callosum. Associated mild edema without mass effect. ?2. Developing encephalomalacia in the areas of previously seen infarct in the inferior right cerebellum. ?3. Remote lacunar infarcts in the left thalamus, right caudate, and bilateral corona radiata. ?4. Moderate chronic microvascular disease. ?MRA: ?1. Severe stenosis of bilateral P2 PCAs with poor flow related signal in the more distal PCAs. Also, severe stenosis of the distal left M1 MCA, bilateral M2 MCA branches and bilateral A2 ACAs. ?2. Similar multifocal moderate narrowing of the distal right intradural vertebral artery. Similar flow related signal in the proximal right PICA, but not the more distal right PICA. ? ?01-19-21: 2-d echo:  ?1. Left ventricular ejection fraction, by estimation, is 70 to 75%. The  left ventricle has hyperdynamic function. The left ventricle has no  regional wall motion abnormalities. There is mild left ventricular  ?hypertrophy. Left ventricular diastolic  parameters are indeterminate.  ? ?01-28-21: chest x-ray:  ?1.  Prior CABG.  Heart size normal. ?2. Mild left base subsegmental atelectasis. Tiny left pleural effusion cannot be excluded.  ?3. Right posterior eighth rib fracture. This may be acute. No pneumothorax. ? ?01-28-21: KUB ?1. Nonobstructive bowel gas pattern. ?2.  Prominent colonic stool volume. Imaging appearance raises the question of chronic constipation. ? ?01-28-21: EKG ?Sinus rhythm with first degree AV block; PAC: R BBB ? ?10-01-21: chest x-ray: no infiltrate; no effusion; no acute findings.   ? ?NO NEW EXAMS.  ? ?LABS REVIEWED: PREVIOUS  ? ?01-18-21: wbc 13.5; hgb 13.2; hct 40.5; mcv 96.4 plt 377; glucose 260; bun 22; creat 1.19; k+ 4.9; na++ 137; ca 9.6 GFR>60; alk phos 161;  albumin 3.6; urine culture: staphylococcus aureus ?01-20-21: wbc 11.6; hgb 12.8; hct 39.0; mcv 96.5 plt 333; glucose 207; bun 26; creat 1.22; k+ 5.1; na++ 139; ca 9.2 GFR>60; hgb a1c 8.3; chol 217; LDL 150; trig 151; hdl 37 ?01-21-21: wbc 13.5; hgb 13.6; hct 41.6; mcv 95.9 plt 390; glucose 243; bun 31;creat 1.28; k+  5.1; na++ 137; ca 9.5 GFR 59; alk phos 134; albumin 3.2 mag 1.8  ? 01-27-21: wbc 20.8; hgb 14.6; hct 43.3; mcv 92.7 pt 468; glucose 414; bun 41; creat 1.58; k+ 5.6; na++ 131; ca 9.5; GFR 45; alk phos 142; albumin 3.4 troponin #1: 10; #2: 13 ?01-28-21: wbc 17.3; hgb 13.5; hct 40.4; mcv 94.0 plt 437; glucose 292; bun 42; creat 1.39; k+ 4.9; na++ 130; ca 9.2 GFR 52; troponin #3 12. Mag 1.8  ?01-29-21: guaiac positive ?02-02-21: guaiac negative ?02-05-21: wbc 11.4; hgb 12.9; hct 39.6; mcv 97.3 plt 397; glucose 194; bun 17; creat 1.13; k+ 4.9; na++ 137; ca 8.4; GFR>60 ?03-12-21: wbc 9.9; hgb 13.1; hct 39.8; mcv 95.4 plt 296; glucose 147; bun 23; creat 1.25; k+ 4.5; na++ 140; ca 9.0; GFR 59; liver normal albumin 3.3 ?03-17-21: tsh 2.566 ?04-08-21: chol 125; ldl 58; trig 213; hdl 24; hgb a1c 6.1; liver normal albumin 3.5;  ?04-27-21: urine micro albumin 7.2  ?10-01-21: wbc 9.3; hgb 11.8; hct 34.8; mcv 95.3 plt 185; glucose 107; bun 31; creat 1.44; k+ 4.5; na++ 138; ca 8.6; GFR 50; d-dimer 1.44 CRP 0.8   ? ?NO NEW LABS.  ? ?Review of Systems  ?Constitutional:  Negative for malaise/fatigue.  ?Respiratory:  Negative for cough and shortness of breath.   ?Cardiovascular:  Negative for chest pain, palpitations and  leg swelling.  ?Gastrointestinal:  Negative for abdominal pain, constipation and heartburn.  ?Musculoskeletal:  Negative for back pain, joint pain and myalgias.  ?Skin: Negative.   ?Neurological:  Negative for dizziness.  ?Psychiat

## 2021-10-09 ENCOUNTER — Encounter (HOSPITAL_COMMUNITY)
Admission: RE | Admit: 2021-10-09 | Discharge: 2021-10-09 | Disposition: A | Discharge status: Home / Self Care | Payer: Medicare Other | Source: Skilled Nursing Facility | Attending: Adult Health | Admitting: Adult Health

## 2021-10-09 DIAGNOSIS — U071 COVID-19: Secondary | ICD-10-CM | POA: Diagnosis not present

## 2021-10-09 LAB — D-DIMER, QUANTITATIVE: D-Dimer, Quant: 0.9 ug/mL-FEU — ABNORMAL HIGH (ref 0.00–0.50)

## 2021-10-11 ENCOUNTER — Other Ambulatory Visit (HOSPITAL_COMMUNITY)
Admission: RE | Admit: 2021-10-11 | Discharge: 2021-10-11 | Disposition: A | Discharge status: Home / Self Care | Payer: Medicare Other | Source: Skilled Nursing Facility | Attending: Adult Health | Admitting: Adult Health

## 2021-10-11 DIAGNOSIS — U071 COVID-19: Secondary | ICD-10-CM | POA: Diagnosis not present

## 2021-10-11 DIAGNOSIS — D6869 Other thrombophilia: Secondary | ICD-10-CM | POA: Insufficient documentation

## 2021-10-11 DIAGNOSIS — E119 Type 2 diabetes mellitus without complications: Secondary | ICD-10-CM | POA: Insufficient documentation

## 2021-10-11 LAB — D-DIMER, QUANTITATIVE: D-Dimer, Quant: 0.88 ug/mL-FEU — ABNORMAL HIGH (ref 0.00–0.50)

## 2021-10-12 DIAGNOSIS — Z8673 Personal history of transient ischemic attack (TIA), and cerebral infarction without residual deficits: Secondary | ICD-10-CM | POA: Insufficient documentation

## 2021-10-20 DIAGNOSIS — F411 Generalized anxiety disorder: Secondary | ICD-10-CM | POA: Diagnosis not present

## 2021-10-20 DIAGNOSIS — F01A4 Vascular dementia, mild, with anxiety: Secondary | ICD-10-CM | POA: Diagnosis not present

## 2021-10-22 DIAGNOSIS — F411 Generalized anxiety disorder: Secondary | ICD-10-CM | POA: Diagnosis not present

## 2021-10-27 DIAGNOSIS — I69351 Hemiplegia and hemiparesis following cerebral infarction affecting right dominant side: Secondary | ICD-10-CM | POA: Diagnosis not present

## 2021-10-27 DIAGNOSIS — F411 Generalized anxiety disorder: Secondary | ICD-10-CM | POA: Diagnosis not present

## 2021-10-27 DIAGNOSIS — R1312 Dysphagia, oropharyngeal phase: Secondary | ICD-10-CM | POA: Diagnosis not present

## 2021-10-28 DIAGNOSIS — R1312 Dysphagia, oropharyngeal phase: Secondary | ICD-10-CM | POA: Diagnosis not present

## 2021-10-28 DIAGNOSIS — I69351 Hemiplegia and hemiparesis following cerebral infarction affecting right dominant side: Secondary | ICD-10-CM | POA: Diagnosis not present

## 2021-10-29 DIAGNOSIS — R1312 Dysphagia, oropharyngeal phase: Secondary | ICD-10-CM | POA: Diagnosis not present

## 2021-10-29 DIAGNOSIS — I69351 Hemiplegia and hemiparesis following cerebral infarction affecting right dominant side: Secondary | ICD-10-CM | POA: Diagnosis not present

## 2021-10-31 DIAGNOSIS — I69351 Hemiplegia and hemiparesis following cerebral infarction affecting right dominant side: Secondary | ICD-10-CM | POA: Diagnosis not present

## 2021-10-31 DIAGNOSIS — R1312 Dysphagia, oropharyngeal phase: Secondary | ICD-10-CM | POA: Diagnosis not present

## 2021-11-01 ENCOUNTER — Encounter: Payer: Self-pay | Admitting: Internal Medicine

## 2021-11-01 ENCOUNTER — Non-Acute Institutional Stay (SKILLED_NURSING_FACILITY): Payer: Medicare Other | Admitting: Internal Medicine

## 2021-11-01 DIAGNOSIS — R1312 Dysphagia, oropharyngeal phase: Secondary | ICD-10-CM | POA: Diagnosis not present

## 2021-11-01 DIAGNOSIS — D649 Anemia, unspecified: Secondary | ICD-10-CM

## 2021-11-01 DIAGNOSIS — E1122 Type 2 diabetes mellitus with diabetic chronic kidney disease: Secondary | ICD-10-CM | POA: Diagnosis not present

## 2021-11-01 DIAGNOSIS — N183 Chronic kidney disease, stage 3 unspecified: Secondary | ICD-10-CM | POA: Diagnosis not present

## 2021-11-01 DIAGNOSIS — I152 Hypertension secondary to endocrine disorders: Secondary | ICD-10-CM | POA: Diagnosis not present

## 2021-11-01 DIAGNOSIS — E1159 Type 2 diabetes mellitus with other circulatory complications: Secondary | ICD-10-CM

## 2021-11-01 DIAGNOSIS — I700 Atherosclerosis of aorta: Secondary | ICD-10-CM

## 2021-11-01 DIAGNOSIS — I69351 Hemiplegia and hemiparesis following cerebral infarction affecting right dominant side: Secondary | ICD-10-CM | POA: Diagnosis not present

## 2021-11-01 DIAGNOSIS — R29818 Other symptoms and signs involving the nervous system: Secondary | ICD-10-CM

## 2021-11-01 DIAGNOSIS — R4189 Other symptoms and signs involving cognitive functions and awareness: Secondary | ICD-10-CM | POA: Diagnosis not present

## 2021-11-01 DIAGNOSIS — I69359 Hemiplegia and hemiparesis following cerebral infarction affecting unspecified side: Secondary | ICD-10-CM | POA: Insufficient documentation

## 2021-11-01 NOTE — Assessment & Plan Note (Signed)
Today's blood pressure is an outlier.  The range has been 95/61 up to 144/74.  If blood pressure remains soft; antihypertensive medications will be adjusted. ?

## 2021-11-01 NOTE — Assessment & Plan Note (Addendum)
In early April creatinine was 1.44 with a GFR of 50 indicating CKD stage IIIa.  Medication list reviewed; no need for adjustment in meds unless there is progression of CKD. ?

## 2021-11-01 NOTE — Patient Instructions (Signed)
See assessment and plan under each diagnosis in the problem list and acutely for this visit 

## 2021-11-01 NOTE — Assessment & Plan Note (Addendum)
Current CBC reveals slight progression of anemia with H/H of 11.8/34.8.  Prior H/H was 13.1/39.8 on 03/12/2021.  No bleeding dyscrasias reported by staff.  Continue to monitor. ?

## 2021-11-01 NOTE — Progress Notes (Signed)
? ?NURSING HOME LOCATION: [place] ?ROOM NUMBER:  111 W ? ?CODE STATUS:  DNR ? ?PCP: Ok Edwards NP,[facility] ? ?This is a nursing facility follow up visit of chronic medical diagnoses & to document compliance with Regulation 483.30 (c) in [employer] [place] Phase [employer] which mandates caregiver visit ( visits can alternate among physician, PA or NP as per statutes) within 10 days of 30 days / 60 days/ 90 days post admission to SNF date   ? ?Interim medical record and care since last SNF visit was updated with review of diagnostic studies and change in clinical status since last visit were documented. ? ?HPI: He is a permanent resident of facility with medical diagnoses of hypertension, hypothyroidism, dyslipidemia, DJD, history of stroke with hemiparesis, and diabetes complicated by peripheral vascular disease.  ? ?In early April he tested positive for COVID.  D-dimer was 1.44; he was on Eliquis.  C reactive protein was 0.8.  Labs revealed a creatinine of 1.44 with a GFR of 50 indicating CKD stage IIIa.  Normochromic normocytic anemia was present with H/H of 11.8/34.8.  The last previous CBC was 03/12/2021 and revealed H/H of 13.1/39.8. ?A1C was 5.4% on 08/12/2021 indicating prediabetes.  It is of note that in July 2022 uncontrolled diabetes was present with an A1c of 8.3%.  In September 2022 TSH was therapeutic at 2.566.  He is on 75 mcg of L-thyroxine.  LDL was at goal of less than 70 with a value of 58 on 04/08/2021. ? ?Review of systems: Dementia invalidated responses.  He was initially in the TV room when I tried to wheel him to his room; he tried to direct me to another. Finally he realized that 111 was correct.  As I was wheeling him out of the TV room, I found his shoe underneath the wheelchair.  When I handed it to him he stated "it hurt my hand? my back... my heel".  ?He stated that "I am fine".   He states that he previously weighed 187 pounds but dropped down to 137 but now has  regained weight to 157.  He does validate occasional dysphagia.  He also has occasional tremor of the left upper extremity.  He did validate that "stroke is giving me problems, right hand numb and will get cold and hurt".  ? ?Physical exam:  ?Pertinent or positive findings: He has a full head of white hair.  Eyebrows are decreased laterally.  There is a small cyst over the right lower lid medially which is not cellulitic.  Dentition is good.  Second heart sound is increased.  Pedal pulses are decreased.  Limbs are atrophic and he has interosseous wasting.  He has marked degenerative joint changes of the hands with isolated flexion contractures bilaterally at the DIP joint.  The right upper and right lower extremities are stronger to opposition than the left upper and left lower extremities. ? ?General appearance:  no acute distress, increased work of breathing is present.   ?Lymphatic: No lymphadenopathy about the head, neck, axilla. ?Eyes: No conjunctival inflammation or lid edema is present. There is no scleral icterus. ?Ears:  External ear exam shows no significant lesions or deformities.   ?Nose:  External nasal examination shows no deformity or inflammation. Nasal mucosa are pink and moist without lesions, exudates ?Oral exam:  Lips and gums are healthy appearing. There is no oropharyngeal erythema or exudate. ?Neck:  No thyromegaly, masses, tenderness noted.    ?Heart:  Normal rate and regular rhythm. S1 normal without gallop, murmur, click, rub .  ?Lungs: Chest clear to auscultation without wheezes, rhonchi, rales, rubs. ?Abdomen: Bowel sounds are normal. Abdomen is soft and nontender with no organomegaly, hernias, masses. ?GU: Deferred  ?Extremities:  No cyanosis, clubbing, edema  ?Neurologic exam :Balance, Rhomberg, finger to nose testing could not be completed due to clinical state ?Skin: Warm & dry w/o tenting. ?No significant lesions or rash. ? ?See summary under each active problem in the Problem List  with associated updated therapeutic plan ? ? ?

## 2021-11-01 NOTE — Assessment & Plan Note (Addendum)
Secondary prevention goals are met at this time as A1c was 5.4% on 08/12/2021 and LDL was 58 on 04/08/2021. ?

## 2021-11-02 NOTE — Assessment & Plan Note (Signed)
He gives me a history of dramatic weight loss and then regain.  This may be confabulatory.  In any event update of the TSH is indicated. ?

## 2021-11-02 NOTE — Assessment & Plan Note (Signed)
No change in the neuromuscular deficits. ?

## 2021-11-03 DIAGNOSIS — R1312 Dysphagia, oropharyngeal phase: Secondary | ICD-10-CM | POA: Diagnosis not present

## 2021-11-03 DIAGNOSIS — F411 Generalized anxiety disorder: Secondary | ICD-10-CM | POA: Diagnosis not present

## 2021-11-03 DIAGNOSIS — I69351 Hemiplegia and hemiparesis following cerebral infarction affecting right dominant side: Secondary | ICD-10-CM | POA: Diagnosis not present

## 2021-11-04 ENCOUNTER — Other Ambulatory Visit (HOSPITAL_COMMUNITY)
Admission: RE | Admit: 2021-11-04 | Discharge: 2021-11-04 | Disposition: A | Discharge status: Home / Self Care | Payer: Medicare Other | Source: Skilled Nursing Facility | Attending: Adult Health | Admitting: Adult Health

## 2021-11-04 DIAGNOSIS — E039 Hypothyroidism, unspecified: Secondary | ICD-10-CM | POA: Insufficient documentation

## 2021-11-04 LAB — TSH: TSH: 1.66 u[IU]/mL (ref 0.350–4.500)

## 2021-11-10 DIAGNOSIS — F411 Generalized anxiety disorder: Secondary | ICD-10-CM | POA: Diagnosis not present

## 2021-11-16 DIAGNOSIS — Z23 Encounter for immunization: Secondary | ICD-10-CM | POA: Diagnosis not present

## 2021-11-17 DIAGNOSIS — F411 Generalized anxiety disorder: Secondary | ICD-10-CM | POA: Diagnosis not present

## 2021-11-22 DIAGNOSIS — I69351 Hemiplegia and hemiparesis following cerebral infarction affecting right dominant side: Secondary | ICD-10-CM | POA: Diagnosis not present

## 2021-11-22 DIAGNOSIS — R41841 Cognitive communication deficit: Secondary | ICD-10-CM | POA: Diagnosis not present

## 2021-11-23 DIAGNOSIS — R41841 Cognitive communication deficit: Secondary | ICD-10-CM | POA: Diagnosis not present

## 2021-11-23 DIAGNOSIS — I69351 Hemiplegia and hemiparesis following cerebral infarction affecting right dominant side: Secondary | ICD-10-CM | POA: Diagnosis not present

## 2021-11-24 DIAGNOSIS — I69351 Hemiplegia and hemiparesis following cerebral infarction affecting right dominant side: Secondary | ICD-10-CM | POA: Diagnosis not present

## 2021-11-24 DIAGNOSIS — F01A4 Vascular dementia, mild, with anxiety: Secondary | ICD-10-CM | POA: Diagnosis not present

## 2021-11-24 DIAGNOSIS — F411 Generalized anxiety disorder: Secondary | ICD-10-CM | POA: Diagnosis not present

## 2021-11-24 DIAGNOSIS — R41841 Cognitive communication deficit: Secondary | ICD-10-CM | POA: Diagnosis not present

## 2021-11-25 DIAGNOSIS — R41841 Cognitive communication deficit: Secondary | ICD-10-CM | POA: Diagnosis not present

## 2021-11-25 DIAGNOSIS — I69351 Hemiplegia and hemiparesis following cerebral infarction affecting right dominant side: Secondary | ICD-10-CM | POA: Diagnosis not present

## 2021-11-26 DIAGNOSIS — R41841 Cognitive communication deficit: Secondary | ICD-10-CM | POA: Diagnosis not present

## 2021-11-26 DIAGNOSIS — I69351 Hemiplegia and hemiparesis following cerebral infarction affecting right dominant side: Secondary | ICD-10-CM | POA: Diagnosis not present

## 2021-11-28 DIAGNOSIS — I69351 Hemiplegia and hemiparesis following cerebral infarction affecting right dominant side: Secondary | ICD-10-CM | POA: Diagnosis not present

## 2021-11-28 DIAGNOSIS — R41841 Cognitive communication deficit: Secondary | ICD-10-CM | POA: Diagnosis not present

## 2021-11-29 DIAGNOSIS — I69351 Hemiplegia and hemiparesis following cerebral infarction affecting right dominant side: Secondary | ICD-10-CM | POA: Diagnosis not present

## 2021-11-29 DIAGNOSIS — R41841 Cognitive communication deficit: Secondary | ICD-10-CM | POA: Diagnosis not present

## 2021-11-30 DIAGNOSIS — R41841 Cognitive communication deficit: Secondary | ICD-10-CM | POA: Diagnosis not present

## 2021-11-30 DIAGNOSIS — I69351 Hemiplegia and hemiparesis following cerebral infarction affecting right dominant side: Secondary | ICD-10-CM | POA: Diagnosis not present

## 2021-12-01 DIAGNOSIS — R41841 Cognitive communication deficit: Secondary | ICD-10-CM | POA: Diagnosis not present

## 2021-12-01 DIAGNOSIS — Z961 Presence of intraocular lens: Secondary | ICD-10-CM | POA: Diagnosis not present

## 2021-12-01 DIAGNOSIS — H35342 Macular cyst, hole, or pseudohole, left eye: Secondary | ICD-10-CM | POA: Diagnosis not present

## 2021-12-01 DIAGNOSIS — I69351 Hemiplegia and hemiparesis following cerebral infarction affecting right dominant side: Secondary | ICD-10-CM | POA: Diagnosis not present

## 2021-12-01 DIAGNOSIS — E113293 Type 2 diabetes mellitus with mild nonproliferative diabetic retinopathy without macular edema, bilateral: Secondary | ICD-10-CM | POA: Diagnosis not present

## 2021-12-01 DIAGNOSIS — H524 Presbyopia: Secondary | ICD-10-CM | POA: Diagnosis not present

## 2021-12-02 ENCOUNTER — Encounter: Payer: Self-pay | Admitting: Adult Health

## 2021-12-02 ENCOUNTER — Non-Acute Institutional Stay (SKILLED_NURSING_FACILITY): Payer: Medicare Other | Admitting: Adult Health

## 2021-12-02 DIAGNOSIS — K5909 Other constipation: Secondary | ICD-10-CM | POA: Diagnosis not present

## 2021-12-02 DIAGNOSIS — Z8673 Personal history of transient ischemic attack (TIA), and cerebral infarction without residual deficits: Secondary | ICD-10-CM

## 2021-12-02 DIAGNOSIS — I69351 Hemiplegia and hemiparesis following cerebral infarction affecting right dominant side: Secondary | ICD-10-CM | POA: Diagnosis not present

## 2021-12-02 DIAGNOSIS — R41841 Cognitive communication deficit: Secondary | ICD-10-CM | POA: Diagnosis not present

## 2021-12-02 DIAGNOSIS — E079 Disorder of thyroid, unspecified: Secondary | ICD-10-CM | POA: Diagnosis not present

## 2021-12-02 NOTE — Progress Notes (Unsigned)
Location:  [place] Room Number: 111-W Place of Service:  SNF (31)   CODE STATUS: DNR  No Known Allergies  Chief Complaint  Patient presents with   Medical Management of Chronic Issues                     Chronic constipation: History of CVA:  Thyroid disease:    HPI:  He is a 76 year old long term resident of this facility being seen for the management of his chronic illnesses: Chronic constipation: History of CVA:  Thyroid disease. He presently being seen by [facility]. There are no reports of uncontrolled pain. No reports of constipation. No reports of depressive thoughts.   Past Medical History:  Diagnosis Date   Hypertension    Benign Essential   Hypothyroidism    Obesity    Osteoarthritis    PVD (peripheral vascular disease) ([place])    Type 2 diabetes mellitus ([place])     Past Surgical History:  Procedure Laterality Date   APPENDECTOMY     TONSILLECTOMY      Social History   Socioeconomic History   Marital status: Single    Spouse name: Not on file   Number of children: Not on file   Years of education: 12   Highest education level: Not on file  Occupational History   Occupation: RETIRED  Tobacco Use   Smoking status: Former   Smokeless tobacco: Never  Scientific laboratory technician Use: Never used  Substance and Sexual Activity   Alcohol use: Not Currently   Drug use: Never   Sexual activity: Not Currently  Other Topics Concern   Not on file  Social History Narrative   Not on file   Social Determinants of Health   Financial Resource Strain: Not on file  Food Insecurity: Not on file  Transportation Needs: Not on file  Physical Activity: Not on file  Stress: Not on file  Social Connections: Not on file  Intimate Partner Violence: Not on file   Family History  Problem Relation Age of Onset   Osteoporosis Mother    Hypertension Mother    Cancer Mother    Heart attack Father    Heart attack Maternal Grandmother    Pneumonia Maternal  Grandfather    Heart disease Paternal Grandmother    Heart disease Paternal Grandfather       VITAL SIGNS BP 98/65   Pulse 64   Temp 97.9 F (36.6 C)   Resp 18   Ht 5' 11"  (1.803 m)   Wt 146 lb 12.8 oz (66.6 kg)   SpO2 98%   BMI 20.47 kg/m   Outpatient Encounter Medications as of 12/02/2021  Medication Sig   aspirin EC 81 MG tablet Take 162 mg by mouth daily. Swallow whole.   atorvastatin (LIPITOR) 40 MG tablet Take 40 mg by mouth daily.   escitalopram (LEXAPRO) 10 MG tablet Take 10 mg by mouth daily.   levothyroxine (SYNTHROID) 75 MCG tablet Take 75 mcg by mouth daily before breakfast.   memantine (NAMENDA XR) 28 MG CP24 24 hr capsule Take 28 mg by mouth daily. 9 am   miconazole (ZEASORB-AF) 2 % powder Apply 1 application topically as needed for itching (to groin and scrotum area substituted with shower to).   NON FORMULARY Diet:Regular Diet with Thin Liquids   omeprazole (PRILOSEC OTC) 20 MG tablet Take 20 mg by mouth at bedtime.   ondansetron (ZOFRAN-ODT) 8 MG disintegrating tablet  Take 8 mg by mouth daily as needed.   senna-docusate (SENOKOT-S) 8.6-50 MG tablet Take 2 tablets by mouth 2 (two) times daily as needed. hold for diarrhea   sitaGLIPtin (JANUVIA) 50 MG tablet Take 50 mg by mouth daily.   triamcinolone cream (KENALOG) 0.1 % Apply 1 application topically 2 (two) times daily. Apply to all areas of psoriasis   [DISCONTINUED] apixaban (ELIQUIS) 2.5 MG TABS tablet Take 2.5 mg by mouth 2 (two) times daily.   [DISCONTINUED] vitamin C (ASCORBIC ACID) 500 MG tablet Take 500 mg by mouth 2 (two) times daily.   [DISCONTINUED] Vitamin D, Ergocalciferol, (DRISDOL) 1.25 MG (50000 UNIT) CAPS capsule Take 50,000 Units by mouth every 7 (seven) days.   [DISCONTINUED] Zinc-Vitamin C (ZINC-A-COLD/VITAMIN C MT) Use as directed 1 tablet in the mouth or throat 5 (five) times daily.   No facility-administered encounter medications on file as of 12/02/2021.     SIGNIFICANT DIAGNOSTIC  EXAMS   PREVIOUS  01-18-21: ct of head:  Brain: Mild chronic ischemic white matter disease is noted. Mild diffuse cortical atrophy is noted. No mass effect or midline shift is noted. Ventricular size is within normal limits. There is no evidence of mass lesion, hemorrhage or acute infarction. Vascular: No hyperdense vessel or unexpected calcification. Skull: Normal. Negative for fracture or focal lesion. Sinuses/Orbits: No acute finding.  01-18-21: MRI/MRA of brain MRI: 1. Acute infarct in the posterolateral right medulla, likely right PICA territory. Additional acute right PCA territory infarcts in the right occipital lobe and posterior right corpus callosum. Associated mild edema without mass effect. 2. Developing encephalomalacia in the areas of previously seen infarct in the inferior right cerebellum. 3. Remote lacunar infarcts in the left thalamus, right caudate, and bilateral corona radiata. 4. Moderate chronic microvascular disease. MRA: 1. Severe stenosis of bilateral P2 PCAs with poor flow related signal in the more distal PCAs. Also, severe stenosis of the distal left M1 MCA, bilateral M2 MCA branches and bilateral A2 ACAs. 2. Similar multifocal moderate narrowing of the distal right intradural vertebral artery. Similar flow related signal in the proximal right PICA, but not the more distal right PICA.  01-19-21: 2-d echo:  1. Left ventricular ejection fraction, by estimation, is 70 to 75%. The  left ventricle has hyperdynamic function. The left ventricle has no  regional wall motion abnormalities. There is mild left ventricular  hypertrophy. Left ventricular diastolic  parameters are indeterminate.   01-28-21: chest x-ray:  1.  Prior CABG.  Heart size normal. 2. Mild left base subsegmental atelectasis. Tiny left pleural effusion cannot be excluded.  3. Right posterior eighth rib fracture. This may be acute. No pneumothorax.  01-28-21: KUB 1. Nonobstructive bowel gas pattern. 2.  Prominent colonic stool volume. Imaging appearance raises the question of chronic constipation.  01-28-21: EKG Sinus rhythm with first degree AV block; PAC: R BBB  10-01-21: chest x-ray: no infiltrate; no effusion; no acute findings.    NO NEW EXAMS.   LABS REVIEWED: PREVIOUS   01-18-21: wbc 13.5; hgb 13.2; hct 40.5; mcv 96.4 plt 377; glucose 260; bun 22; creat 1.19; k+ 4.9; na++ 137; ca 9.6 GFR>60; alk phos 161;  albumin 3.6; urine culture: staphylococcus aureus 01-20-21: wbc 11.6; hgb 12.8; hct 39.0; mcv 96.5 plt 333; glucose 207; bun 26; creat 1.22; k+ 5.1; na++ 139; ca 9.2 GFR>60; hgb a1c 8.3; chol 217; LDL 150; trig 151; hdl 37 01-21-21: wbc 13.5; hgb 13.6; hct 41.6; mcv 95.9 plt 390; glucose 243; bun 31;creat 1.28; k+  5.1; na++ 137; ca 9.5 GFR 59; alk phos 134; albumin 3.2 mag 1.8   01-27-21: wbc 20.8; hgb 14.6; hct 43.3; mcv 92.7 pt 468; glucose 414; bun 41; creat 1.58; k+ 5.6; na++ 131; ca 9.5; GFR 45; alk phos 142; albumin 3.4 troponin #1: 10; #2: 13 01-28-21: wbc 17.3; hgb 13.5; hct 40.4; mcv 94.0 plt 437; glucose 292; bun 42; creat 1.39; k+ 4.9; na++ 130; ca 9.2 GFR 52; troponin #3 12. Mag 1.8  01-29-21: guaiac positive 02-02-21: guaiac negative 02-05-21: wbc 11.4; hgb 12.9; hct 39.6; mcv 97.3 plt 397; glucose 194; bun 17; creat 1.13; k+ 4.9; na++ 137; ca 8.4; GFR>60 03-12-21: wbc 9.9; hgb 13.1; hct 39.8; mcv 95.4 plt 296; glucose 147; bun 23; creat 1.25; k+ 4.5; na++ 140; ca 9.0; GFR 59; liver normal albumin 3.3 03-17-21: tsh 2.566 04-08-21: chol 125; ldl 58; trig 213; hdl 24; hgb a1c 6.1; liver normal albumin 3.5;  04-27-21: urine micro albumin 7.2  10-01-21: wbc 9.3; hgb 11.8; hct 34.8; mcv 95.3 plt 185; glucose 107; bun 31; creat 1.44; k+ 4.5; na++ 138; ca 8.6; GFR 50; d-dimer 1.44 CRP 0.8    NO NEW LABS.   Review of Systems  Constitutional:  Negative for malaise/fatigue.  Respiratory:  Negative for cough and shortness of breath.   Cardiovascular:  Negative for chest pain, palpitations and  leg swelling.  Gastrointestinal:  Negative for abdominal pain, constipation and heartburn.  Musculoskeletal:  Negative for back pain, joint pain and myalgias.  Skin: Negative.   Neurological:  Negative for dizziness.  Psychiatric/Behavioral:  The patient is not nervous/anxious.    Physical Exam Constitutional:      General: He is not in acute distress.    Appearance: He is well-developed. He is not diaphoretic.  Neck:     Thyroid: No thyromegaly.  Cardiovascular:     Rate and Rhythm: Normal rate and regular rhythm.     Pulses: Normal pulses.     Heart sounds: Normal heart sounds.  Pulmonary:     Effort: Pulmonary effort is normal. No respiratory distress.     Breath sounds: Normal breath sounds.  Abdominal:     General: Bowel sounds are normal. There is no distension.     Palpations: Abdomen is soft.     Tenderness: There is no abdominal tenderness.  Musculoskeletal:        General: Normal range of motion.     Cervical back: Neck supple.     Right lower leg: No edema.     Left lower leg: No edema.  Lymphadenopathy:     Cervical: No cervical adenopathy.  Skin:    General: Skin is warm and dry.  Neurological:     Mental Status: He is alert. Mental status is at baseline.  Psychiatric:        Mood and Affect: Mood normal.         ASSESSMENT/ PLAN:  TODAY  Chronic constipation: will continue senna s 2 tabs twice daily   2. History of CVA: will continue asa 162 mg daily off plavix  3. Thyroid disease: tsh 2.566 will continue synthroid 75 mcg daily   PREVIOUS   4. Type 2 diabetes mellitus with neurological manifestations: is stable hgb a1c is 6.1 will continue januvia 25 mg daily   5. Mild protein calorie malnutrition: is stable albumin 3.2 will continue supplements as directed.   6. Hyperlipidemia associated with type 2 diabetes mellitus: LDL 58; will continue lipitor 40 mg daily  7. Hypertension associated with type 2 diabetes mellitus: is stable b/p 98/65 will  continue cozaar 12.5 mg daily   8. GERD without esophagitis: is stable will continue prilosec 20 mg daily     Ok Edwards NP [facility]   call [phone]

## 2021-12-03 ENCOUNTER — Encounter: Payer: Self-pay | Admitting: Adult Health

## 2021-12-03 ENCOUNTER — Non-Acute Institutional Stay (SKILLED_NURSING_FACILITY): Payer: Medicare Other | Admitting: Adult Health

## 2021-12-03 DIAGNOSIS — I69359 Hemiplegia and hemiparesis following cerebral infarction affecting unspecified side: Secondary | ICD-10-CM | POA: Diagnosis not present

## 2021-12-03 DIAGNOSIS — I700 Atherosclerosis of aorta: Secondary | ICD-10-CM | POA: Diagnosis not present

## 2021-12-03 DIAGNOSIS — F01C0 Vascular dementia, severe, without behavioral disturbance, psychotic disturbance, mood disturbance, and anxiety: Secondary | ICD-10-CM | POA: Diagnosis not present

## 2021-12-03 NOTE — Progress Notes (Signed)
Location:  [facility] Room Number: 111-W Place of Service:  SNF (31)   CODE STATUS: DNR  No Known Allergies  Chief Complaint  Patient presents with   Acute Visit    Care plan meeting    HPI:  We have come together for his care plan meeting. Family present. BIMS; 15/15, mood 0/30. Has had one fall without injury. He is non ambulatory. He requires extensive assist to dependent for his adl care. He is incontinent of bladder and bowel. Dietary: regular diet: appetite 50-100% weight is 147 pounds is stable . Therapy: d/c yesterday. He continues to be followed for his chronic illnesses including: Aortic atherosclerosis Hemiplegia; hemiparesis following cerebral infarction affecting unspecified side  Severe vascular dementia without vascular behavorial  disturbance; psychotic disturbance; mood disturbance; or anxiety   Past Medical History:  Diagnosis Date   Hypertension    Benign Essential   Hypothyroidism    Obesity    Osteoarthritis    PVD (peripheral vascular disease) ([place])    Type 2 diabetes mellitus (HCC)     Past Surgical History:  Procedure Laterality Date   APPENDECTOMY     TONSILLECTOMY      Social History   Socioeconomic History   Marital status: Single    Spouse name: Not on file   Number of children: Not on file   Years of education: 12   Highest education level: Not on file  Occupational History   Occupation: RETIRED  Tobacco Use   Smoking status: Former   Smokeless tobacco: Never  Scientific laboratory technician Use: Never used  Substance and Sexual Activity   Alcohol use: Not Currently   Drug use: Never   Sexual activity: Not Currently  Other Topics Concern   Not on file  Social History Narrative   Not on file   Social Determinants of Health   Financial Resource Strain: Not on file  Food Insecurity: Not on file  Transportation Needs: Not on file  Physical Activity: Not on file  Stress: Not on file  Social Connections: Not on file   Intimate Partner Violence: Not on file   Family History  Problem Relation Age of Onset   Osteoporosis Mother    Hypertension Mother    Cancer Mother    Heart attack Father    Heart attack Maternal Grandmother    Pneumonia Maternal Grandfather    Heart disease Paternal Grandmother    Heart disease Paternal Grandfather       VITAL SIGNS BP 98/65   Pulse 64   Temp 97.6 F (36.4 C)   Resp 18   Ht 5' 11"  (1.803 m)   Wt 146 lb 12.8 oz (66.6 kg)   SpO2 98%   BMI 20.47 kg/m   Outpatient Encounter Medications as of 12/03/2021  Medication Sig   aspirin EC 81 MG tablet Take 162 mg by mouth daily. Swallow whole.   atorvastatin (LIPITOR) 40 MG tablet Take 40 mg by mouth daily.   escitalopram (LEXAPRO) 10 MG tablet Take 10 mg by mouth daily.   levothyroxine (SYNTHROID) 75 MCG tablet Take 75 mcg by mouth daily before breakfast.   memantine (NAMENDA XR) 28 MG CP24 24 hr capsule Take 28 mg by mouth daily. 9 am   miconazole (ZEASORB-AF) 2 % powder Apply 1 application topically as needed for itching (to groin and scrotum area substituted with shower to).   NON FORMULARY Diet:Regular Diet with Thin Liquids   omeprazole (PRILOSEC OTC) 20 MG  tablet Take 20 mg by mouth at bedtime.   ondansetron (ZOFRAN-ODT) 8 MG disintegrating tablet Take 8 mg by mouth daily as needed.   senna-docusate (SENOKOT-S) 8.6-50 MG tablet Take 2 tablets by mouth 2 (two) times daily as needed. hold for diarrhea   sitaGLIPtin (JANUVIA) 50 MG tablet Take 50 mg by mouth daily.   triamcinolone cream (KENALOG) 0.1 % Apply 1 application topically 2 (two) times daily. Apply to all areas of psoriasis   No facility-administered encounter medications on file as of 12/03/2021.     SIGNIFICANT DIAGNOSTIC EXAMS  PREVIOUS  01-18-21: ct of head:  Brain: Mild chronic ischemic white matter disease is noted. Mild diffuse cortical atrophy is noted. No mass effect or midline shift is noted. Ventricular size is within normal limits.  There is no evidence of mass lesion, hemorrhage or acute infarction. Vascular: No hyperdense vessel or unexpected calcification. Skull: Normal. Negative for fracture or focal lesion. Sinuses/Orbits: No acute finding.  01-18-21: MRI/MRA of brain MRI: 1. Acute infarct in the posterolateral right medulla, likely right PICA territory. Additional acute right PCA territory infarcts in the right occipital lobe and posterior right corpus callosum. Associated mild edema without mass effect. 2. Developing encephalomalacia in the areas of previously seen infarct in the inferior right cerebellum. 3. Remote lacunar infarcts in the left thalamus, right caudate, and bilateral corona radiata. 4. Moderate chronic microvascular disease. MRA: 1. Severe stenosis of bilateral P2 PCAs with poor flow related signal in the more distal PCAs. Also, severe stenosis of the distal left M1 MCA, bilateral M2 MCA branches and bilateral A2 ACAs. 2. Similar multifocal moderate narrowing of the distal right intradural vertebral artery. Similar flow related signal in the proximal right PICA, but not the more distal right PICA.  01-19-21: 2-d echo:  1. Left ventricular ejection fraction, by estimation, is 70 to 75%. The  left ventricle has hyperdynamic function. The left ventricle has no  regional wall motion abnormalities. There is mild left ventricular  hypertrophy. Left ventricular diastolic  parameters are indeterminate.   01-28-21: chest x-ray:  1.  Prior CABG.  Heart size normal. 2. Mild left base subsegmental atelectasis. Tiny left pleural effusion cannot be excluded.  3. Right posterior eighth rib fracture. This may be acute. No pneumothorax.  01-28-21: KUB 1. Nonobstructive bowel gas pattern. 2. Prominent colonic stool volume. Imaging appearance raises the question of chronic constipation.  01-28-21: EKG Sinus rhythm with first degree AV block; PAC: R BBB  10-01-21: chest x-ray: no infiltrate; no effusion; no acute  findings.    NO NEW EXAMS.   LABS REVIEWED: PREVIOUS   01-18-21: wbc 13.5; hgb 13.2; hct 40.5; mcv 96.4 plt 377; glucose 260; bun 22; creat 1.19; k+ 4.9; na++ 137; ca 9.6 GFR>60; alk phos 161;  albumin 3.6; urine culture: staphylococcus aureus 01-20-21: wbc 11.6; hgb 12.8; hct 39.0; mcv 96.5 plt 333; glucose 207; bun 26; creat 1.22; k+ 5.1; na++ 139; ca 9.2 GFR>60; hgb a1c 8.3; chol 217; LDL 150; trig 151; hdl 37 01-21-21: wbc 13.5; hgb 13.6; hct 41.6; mcv 95.9 plt 390; glucose 243; bun 31;creat 1.28; k+ 5.1; na++ 137; ca 9.5 GFR 59; alk phos 134; albumin 3.2 mag 1.8   01-27-21: wbc 20.8; hgb 14.6; hct 43.3; mcv 92.7 pt 468; glucose 414; bun 41; creat 1.58; k+ 5.6; na++ 131; ca 9.5; GFR 45; alk phos 142; albumin 3.4 troponin #1: 10; #2: 13 01-28-21: wbc 17.3; hgb 13.5; hct 40.4; mcv 94.0 plt 437; glucose 292; bun 42;  creat 1.39; k+ 4.9; na++ 130; ca 9.2 GFR 52; troponin #3 12. Mag 1.8  01-29-21: guaiac positive 02-02-21: guaiac negative 02-05-21: wbc 11.4; hgb 12.9; hct 39.6; mcv 97.3 plt 397; glucose 194; bun 17; creat 1.13; k+ 4.9; na++ 137; ca 8.4; GFR>60 03-12-21: wbc 9.9; hgb 13.1; hct 39.8; mcv 95.4 plt 296; glucose 147; bun 23; creat 1.25; k+ 4.5; na++ 140; ca 9.0; GFR 59; liver normal albumin 3.3 03-17-21: tsh 2.566 04-08-21: chol 125; ldl 58; trig 213; hdl 24; hgb a1c 6.1; liver normal albumin 3.5;  04-27-21: urine micro albumin 7.2  10-01-21: wbc 9.3; hgb 11.8; hct 34.8; mcv 95.3 plt 185; glucose 107; bun 31; creat 1.44; k+ 4.5; na++ 138; ca 8.6; GFR 50; d-dimer 1.44 CRP 0.8    NO NEW LABS.   Review of Systems  Constitutional:  Negative for malaise/fatigue.  Respiratory:  Negative for cough and shortness of breath.   Cardiovascular:  Negative for chest pain, palpitations and leg swelling.  Gastrointestinal:  Negative for abdominal pain, constipation and heartburn.  Musculoskeletal:  Negative for back pain, joint pain and myalgias.  Skin: Negative.   Neurological:  Negative for dizziness.   Psychiatric/Behavioral:  The patient is not nervous/anxious.    Physical Exam Constitutional:      General: He is not in acute distress.    Appearance: He is well-developed. He is not diaphoretic.  Neck:     Thyroid: No thyromegaly.  Cardiovascular:     Rate and Rhythm: Normal rate and regular rhythm.     Pulses: Normal pulses.     Heart sounds: Normal heart sounds.  Pulmonary:     Effort: Pulmonary effort is normal. No respiratory distress.     Breath sounds: Normal breath sounds.  Abdominal:     General: Bowel sounds are normal. There is no distension.     Palpations: Abdomen is soft.     Tenderness: There is no abdominal tenderness.  Musculoskeletal:        General: Normal range of motion.     Cervical back: Neck supple.  Lymphadenopathy:     Cervical: No cervical adenopathy.  Skin:    General: Skin is warm and dry.  Neurological:     Mental Status: He is alert. Mental status is at baseline.  Psychiatric:        Mood and Affect: Mood normal.      ASSESSMENT/ PLAN:  TODAY  Aortic atherosclerosis Hemiplegia; hemiparesis following cerebral infarction affecting unspecified side Severe vascular dementia without vascular behavorial  disturbance; psychotic disturbance; mood disturbance or anxiety   Will continue current medications Will continue current plan of care Will continue to monitor his status.   Time spent with patient: 40 minutes: plan of care; medications; therapy.    Ok Edwards NP [facility]  Contact [phone] Monday through Friday 8am- 5pm  After hours call [phone]

## 2021-12-08 DIAGNOSIS — F411 Generalized anxiety disorder: Secondary | ICD-10-CM | POA: Diagnosis not present

## 2021-12-15 DIAGNOSIS — F411 Generalized anxiety disorder: Secondary | ICD-10-CM | POA: Diagnosis not present

## 2021-12-20 DIAGNOSIS — E1151 Type 2 diabetes mellitus with diabetic peripheral angiopathy without gangrene: Secondary | ICD-10-CM | POA: Diagnosis not present

## 2021-12-20 DIAGNOSIS — Z7984 Long term (current) use of oral hypoglycemic drugs: Secondary | ICD-10-CM | POA: Diagnosis not present

## 2021-12-20 DIAGNOSIS — B351 Tinea unguium: Secondary | ICD-10-CM | POA: Diagnosis not present

## 2021-12-20 DIAGNOSIS — M79675 Pain in left toe(s): Secondary | ICD-10-CM | POA: Diagnosis not present

## 2021-12-22 DIAGNOSIS — F411 Generalized anxiety disorder: Secondary | ICD-10-CM | POA: Diagnosis not present

## 2021-12-22 DIAGNOSIS — F01A4 Vascular dementia, mild, with anxiety: Secondary | ICD-10-CM | POA: Diagnosis not present

## 2021-12-29 DIAGNOSIS — F411 Generalized anxiety disorder: Secondary | ICD-10-CM | POA: Diagnosis not present

## 2022-01-03 ENCOUNTER — Non-Acute Institutional Stay (SKILLED_NURSING_FACILITY): Payer: Medicare Other | Admitting: Adult Health

## 2022-01-03 ENCOUNTER — Encounter: Payer: Self-pay | Admitting: Adult Health

## 2022-01-03 DIAGNOSIS — E1169 Type 2 diabetes mellitus with other specified complication: Secondary | ICD-10-CM

## 2022-01-03 DIAGNOSIS — E1149 Type 2 diabetes mellitus with other diabetic neurological complication: Secondary | ICD-10-CM | POA: Diagnosis not present

## 2022-01-03 DIAGNOSIS — E441 Mild protein-calorie malnutrition: Secondary | ICD-10-CM

## 2022-01-03 DIAGNOSIS — E785 Hyperlipidemia, unspecified: Secondary | ICD-10-CM | POA: Diagnosis not present

## 2022-01-03 NOTE — Progress Notes (Signed)
Location:  [facility] Room Number: 111-W Place of Service:  SNF (31)   CODE STATUS: DNR  No Known Allergies  Chief Complaint  Patient presents with   Medical Management of Chronic Issues                        type 2 diabetes mellitus with neurological manifestations;  Mild protein calorie malnutrition: Hyperlipidemia associated with type 2 diabetes mellitus    HPI:  He is a 76 year old long term resident of this facility being seen for the management of her chronic illnesses:  type 2 diabetes mellitus with neurological manifestations;  Mild protein calorie malnutrition: Hyperlipidemia associated with type 2 diabetes mellitus. There are no reports of uncontrolled pain; no reports of anxiety or agitation. His weight is stable.   Past Medical History:  Diagnosis Date   Hypertension    Benign Essential   Hypothyroidism    Obesity    Osteoarthritis    PVD (peripheral vascular disease) ([place])    Type 2 diabetes mellitus ([place])     Past Surgical History:  Procedure Laterality Date   APPENDECTOMY     TONSILLECTOMY      Social History   Socioeconomic History   Marital status: Single    Spouse name: Not on file   Number of children: Not on file   Years of education: 12   Highest education level: Not on file  Occupational History   Occupation: RETIRED  Tobacco Use   Smoking status: Former   Smokeless tobacco: Never  Scientific laboratory technician Use: Never used  Substance and Sexual Activity   Alcohol use: Not Currently   Drug use: Never   Sexual activity: Not Currently  Other Topics Concern   Not on file  Social History Narrative   Not on file   Social Determinants of Health   Financial Resource Strain: Not on file  Food Insecurity: Not on file  Transportation Needs: Not on file  Physical Activity: Not on file  Stress: Not on file  Social Connections: Not on file  Intimate Partner Violence: Not on file   Family History  Problem Relation Age  of Onset   Osteoporosis Mother    Hypertension Mother    Cancer Mother    Heart attack Father    Heart attack Maternal Grandmother    Pneumonia Maternal Grandfather    Heart disease Paternal Grandmother    Heart disease Paternal Grandfather       VITAL SIGNS BP 128/74   Pulse 78   Temp 98.6 F (37 C)   Resp (!) 22   Ht 5' 11"  (1.803 m)   Wt 147 lb 6.4 oz (66.9 kg)   SpO2 98%   BMI 20.56 kg/m   Outpatient Encounter Medications as of 01/03/2022  Medication Sig   aspirin EC 81 MG tablet Take 162 mg by mouth daily. Swallow whole.   atorvastatin (LIPITOR) 40 MG tablet Take 40 mg by mouth daily.   escitalopram (LEXAPRO) 10 MG tablet Take 10 mg by mouth daily.   levothyroxine (SYNTHROID) 75 MCG tablet Take 75 mcg by mouth daily before breakfast.   memantine (NAMENDA XR) 28 MG CP24 24 hr capsule Take 28 mg by mouth daily. 9 am   miconazole (ZEASORB-AF) 2 % powder Apply 1 application topically as needed for itching (to groin and scrotum area substituted with shower to).   NON FORMULARY Diet:Regular Diet with Thin Liquids  omeprazole (PRILOSEC OTC) 20 MG tablet Take 20 mg by mouth at bedtime.   ondansetron (ZOFRAN-ODT) 8 MG disintegrating tablet Take 8 mg by mouth daily as needed.   senna-docusate (SENOKOT-S) 8.6-50 MG tablet Take 2 tablets by mouth 2 (two) times daily as needed. hold for diarrhea   sitaGLIPtin (JANUVIA) 50 MG tablet Take 50 mg by mouth daily.   triamcinolone cream (KENALOG) 0.1 % Apply 1 application topically 2 (two) times daily. Apply to all areas of psoriasis   No facility-administered encounter medications on file as of 01/03/2022.     SIGNIFICANT DIAGNOSTIC EXAMS   PREVIOUS  01-18-21: ct of head:  Brain: Mild chronic ischemic white matter disease is noted. Mild diffuse cortical atrophy is noted. No mass effect or midline shift is noted. Ventricular size is within normal limits. There is no evidence of mass lesion, hemorrhage or acute infarction. Vascular:  No hyperdense vessel or unexpected calcification. Skull: Normal. Negative for fracture or focal lesion. Sinuses/Orbits: No acute finding.  01-18-21: MRI/MRA of brain MRI: 1. Acute infarct in the posterolateral right medulla, likely right PICA territory. Additional acute right PCA territory infarcts in the right occipital lobe and posterior right corpus callosum. Associated mild edema without mass effect. 2. Developing encephalomalacia in the areas of previously seen infarct in the inferior right cerebellum. 3. Remote lacunar infarcts in the left thalamus, right caudate, and bilateral corona radiata. 4. Moderate chronic microvascular disease. MRA: 1. Severe stenosis of bilateral P2 PCAs with poor flow related signal in the more distal PCAs. Also, severe stenosis of the distal left M1 MCA, bilateral M2 MCA branches and bilateral A2 ACAs. 2. Similar multifocal moderate narrowing of the distal right intradural vertebral artery. Similar flow related signal in the proximal right PICA, but not the more distal right PICA.  01-19-21: 2-d echo:  1. Left ventricular ejection fraction, by estimation, is 70 to 75%. The  left ventricle has hyperdynamic function. The left ventricle has no  regional wall motion abnormalities. There is mild left ventricular  hypertrophy. Left ventricular diastolic  parameters are indeterminate.   01-28-21: chest x-ray:  1.  Prior CABG.  Heart size normal. 2. Mild left base subsegmental atelectasis. Tiny left pleural effusion cannot be excluded.  3. Right posterior eighth rib fracture. This may be acute. No pneumothorax.  01-28-21: KUB 1. Nonobstructive bowel gas pattern. 2. Prominent colonic stool volume. Imaging appearance raises the question of chronic constipation.  01-28-21: EKG Sinus rhythm with first degree AV block; PAC: R BBB  10-01-21: chest x-ray: no infiltrate; no effusion; no acute findings.    NO NEW EXAMS.   LABS REVIEWED: PREVIOUS   01-18-21: wbc 13.5; hgb  13.2; hct 40.5; mcv 96.4 plt 377; glucose 260; bun 22; creat 1.19; k+ 4.9; na++ 137; ca 9.6 GFR>60; alk phos 161;  albumin 3.6; urine culture: staphylococcus aureus 01-20-21: wbc 11.6; hgb 12.8; hct 39.0; mcv 96.5 plt 333; glucose 207; bun 26; creat 1.22; k+ 5.1; na++ 139; ca 9.2 GFR>60; hgb a1c 8.3; chol 217; LDL 150; trig 151; hdl 37 01-21-21: wbc 13.5; hgb 13.6; hct 41.6; mcv 95.9 plt 390; glucose 243; bun 31;creat 1.28; k+ 5.1; na++ 137; ca 9.5 GFR 59; alk phos 134; albumin 3.2 mag 1.8   01-27-21: wbc 20.8; hgb 14.6; hct 43.3; mcv 92.7 pt 468; glucose 414; bun 41; creat 1.58; k+ 5.6; na++ 131; ca 9.5; GFR 45; alk phos 142; albumin 3.4 troponin #1: 10; #2: 13 01-28-21: wbc 17.3; hgb 13.5; hct 40.4; mcv 94.0  plt 437; glucose 292; bun 42; creat 1.39; k+ 4.9; na++ 130; ca 9.2 GFR 52; troponin #3 12. Mag 1.8  01-29-21: guaiac positive 02-02-21: guaiac negative 02-05-21: wbc 11.4; hgb 12.9; hct 39.6; mcv 97.3 plt 397; glucose 194; bun 17; creat 1.13; k+ 4.9; na++ 137; ca 8.4; GFR>60 03-12-21: wbc 9.9; hgb 13.1; hct 39.8; mcv 95.4 plt 296; glucose 147; bun 23; creat 1.25; k+ 4.5; na++ 140; ca 9.0; GFR 59; liver normal albumin 3.3 03-17-21: tsh 2.566 04-08-21: chol 125; ldl 58; trig 213; hdl 24; hgb a1c 6.1; liver normal albumin 3.5;  04-27-21: urine micro albumin 7.2  08-12-21: hgb a1c 5.4  10-01-21: wbc 9.3; hgb 11.8; hct 34.8; mcv 95.3 plt 185; glucose 107; bun 31; creat 1.44; k+ 4.5; na++ 138; ca 8.6; GFR 50; d-dimer 1.44 CRP 0.8    NO NEW LABS.   Review of Systems  Constitutional:  Negative for malaise/fatigue.  Respiratory:  Negative for cough and shortness of breath.   Cardiovascular:  Negative for chest pain, palpitations and leg swelling.  Gastrointestinal:  Negative for abdominal pain, constipation and heartburn.  Musculoskeletal:  Negative for back pain, joint pain and myalgias.  Skin: Negative.   Neurological:  Negative for dizziness.  Psychiatric/Behavioral:  The patient is not nervous/anxious.     Physical Exam Constitutional:      General: He is not in acute distress.    Appearance: He is well-developed. He is not diaphoretic.  Neck:     Thyroid: No thyromegaly.  Cardiovascular:     Rate and Rhythm: Normal rate and regular rhythm.     Pulses: Normal pulses.     Heart sounds: Normal heart sounds.  Pulmonary:     Effort: Pulmonary effort is normal. No respiratory distress.     Breath sounds: Normal breath sounds.  Abdominal:     General: Bowel sounds are normal. There is no distension.     Palpations: Abdomen is soft.     Tenderness: There is no abdominal tenderness.  Musculoskeletal:        General: Normal range of motion.     Cervical back: Neck supple.     Right lower leg: No edema.     Left lower leg: No edema.  Lymphadenopathy:     Cervical: No cervical adenopathy.  Skin:    General: Skin is warm and dry.  Neurological:     Mental Status: He is alert. Mental status is at baseline.  Psychiatric:        Mood and Affect: Mood normal.           ASSESSMENT/ PLAN:  TODAY  Type 2 diabetes mellitus with neurological manifestations; stable hgb a1c 5.4; will continue januvia 50 mg daily   2. Mild protein calorie malnutrition: albumin 3.2 will continue supplements as directed  3. Hyperlipidemia associated with type 2 diabetes mellitus:LDL 58 will continue lipitor 40 mg daily    PREVIOUS   5. Hypertension associated with type 2 diabetes mellitus: is stable b/p 128/74 will monitor   6. GERD without esophagitis: is stable will continue prilosec 20 mg daily   7. Chronic constipation: will continue senna s 2 tabs twice daily as needed   8. History of CVA: will continue asa 162 mg daily off plavix  9. Thyroid disease: tsh 2.566 will continue synthroid 75 mcg daily  10. Hemiplegia and hemiparesis following cerebral infections unspecified side  11. Vascular dementia without behavioral disturbance;  psychotic disturbance mood disturbance or anxiety: weight is 147  pounds.   12. CKD stage 3 associated with type 2 diabetes mellitus.       Ok Edwards NP [facility]  call [phone]

## 2022-01-05 DIAGNOSIS — F411 Generalized anxiety disorder: Secondary | ICD-10-CM | POA: Diagnosis not present

## 2022-01-06 ENCOUNTER — Other Ambulatory Visit (HOSPITAL_COMMUNITY)
Admission: RE | Admit: 2022-01-06 | Discharge: 2022-01-06 | Disposition: A | Discharge status: Home / Self Care | Payer: Medicare Other | Source: Skilled Nursing Facility | Attending: Adult Health | Admitting: Adult Health

## 2022-01-06 DIAGNOSIS — E1149 Type 2 diabetes mellitus with other diabetic neurological complication: Secondary | ICD-10-CM | POA: Diagnosis not present

## 2022-01-06 LAB — HEMOGLOBIN A1C
Hgb A1c MFr Bld: 5.3 % (ref 4.8–5.6)
Mean Plasma Glucose: 105.41 mg/dL

## 2022-01-12 DIAGNOSIS — F411 Generalized anxiety disorder: Secondary | ICD-10-CM | POA: Diagnosis not present

## 2022-01-18 DIAGNOSIS — F01A4 Vascular dementia, mild, with anxiety: Secondary | ICD-10-CM | POA: Diagnosis not present

## 2022-01-18 DIAGNOSIS — F411 Generalized anxiety disorder: Secondary | ICD-10-CM | POA: Diagnosis not present

## 2022-01-18 DIAGNOSIS — F321 Major depressive disorder, single episode, moderate: Secondary | ICD-10-CM | POA: Diagnosis not present

## 2022-01-19 DIAGNOSIS — F411 Generalized anxiety disorder: Secondary | ICD-10-CM | POA: Diagnosis not present

## 2022-01-26 DIAGNOSIS — F411 Generalized anxiety disorder: Secondary | ICD-10-CM | POA: Diagnosis not present

## 2022-01-27 ENCOUNTER — Encounter: Payer: Self-pay | Admitting: Internal Medicine

## 2022-01-27 ENCOUNTER — Non-Acute Institutional Stay (SKILLED_NURSING_FACILITY): Payer: Medicare Other | Admitting: Internal Medicine

## 2022-01-27 DIAGNOSIS — E1169 Type 2 diabetes mellitus with other specified complication: Secondary | ICD-10-CM | POA: Diagnosis not present

## 2022-01-27 DIAGNOSIS — F01C0 Vascular dementia, severe, without behavioral disturbance, psychotic disturbance, mood disturbance, and anxiety: Secondary | ICD-10-CM

## 2022-01-27 DIAGNOSIS — D649 Anemia, unspecified: Secondary | ICD-10-CM

## 2022-01-27 DIAGNOSIS — E1122 Type 2 diabetes mellitus with diabetic chronic kidney disease: Secondary | ICD-10-CM

## 2022-01-27 DIAGNOSIS — E039 Hypothyroidism, unspecified: Secondary | ICD-10-CM

## 2022-01-27 DIAGNOSIS — E785 Hyperlipidemia, unspecified: Secondary | ICD-10-CM | POA: Diagnosis not present

## 2022-01-27 DIAGNOSIS — E1149 Type 2 diabetes mellitus with other diabetic neurological complication: Secondary | ICD-10-CM

## 2022-01-27 DIAGNOSIS — N183 Chronic kidney disease, stage 3 unspecified: Secondary | ICD-10-CM | POA: Diagnosis not present

## 2022-01-27 DIAGNOSIS — I10 Essential (primary) hypertension: Secondary | ICD-10-CM | POA: Diagnosis not present

## 2022-01-27 NOTE — Progress Notes (Signed)
NURSING HOME LOCATION:  [facility] ROOM NUMBER:  111 W  CODE STATUS:  DNR  PCP:  Synthia Innocent NP  This is a nursing facility follow up visit of chronic medical diagnoses & to document compliance with Regulation 483.30 (c) in The Long Term Care Survey Manual Phase 2 which mandates caregiver visit ( visits can alternate among physician, PA or NP as per statutes) within 10 days of 30 days / 60 days/ 90 days post admission to SNF date    Interim medical record and care since last SNF visit was updated with review of diagnostic studies and change in clinical status since last visit were documented.  HPI: He is a permanent resident of this facility with medical diagnoses of dyslipidemia, hypothyroidism, essential pretension, GERD, and diabetes with vascular complications.  His most recent A1c was 5.3% on 7/13, nondiabetic.  TSH is current and therapeutic with a value of 1.660.  In October 22, 2022 creatinine was 1.44 and GFR 50 indicating CKD stage IIIa.  Normochromic, normocytic anemia was present with H/H of 11.8/34.8.  Serially these have been relatively stable.  LDL was at goal with a value of 58 in October 2022.  He remains on relatively high-dose atorvastatin at 40 mg daily.  Review of systems: Dementia invalidated responses.  Although he gave the correct date he commented about his mother dying "several months ago."  Apparently she died at the age of 67 in Oct 22, 2010.  He denies any active symptoms except "I almost caused a stroke".  He intimated that he got irate at his brother for "renting out rooms to strangers."  He refers to his brother as "mentally challenged".  When I asked what signs or symptoms he had to suggest that he might of had a stroke or TIA, his response was "I got stupid and lost control, cussing." He denied any neurologic symptoms or signs.  Constitutional: No fever, significant weight change, fatigue  Eyes: No redness, discharge, pain, vision change ENT/mouth: No nasal  congestion,  purulent discharge, earache, change in hearing, sore throat  Cardiovascular: No chest pain, palpitations, paroxysmal nocturnal dyspnea, claudication, edema  Respiratory: No cough, sputum production, hemoptysis, DOE, significant snoring, apnea   Gastrointestinal: No heartburn, dysphagia, abdominal pain, nausea /vomiting, rectal bleeding, melena, change in bowels Genitourinary: No dysuria, hematuria, pyuria, incontinence, nocturia Musculoskeletal: No joint stiffness, joint swelling, weakness, pain Dermatologic: No rash, pruritus, change in appearance of skin Neurologic: No dizziness, headache, syncope, seizures, numbness, tingling Psychiatric: No significant anxiety, depression, insomnia, anorexia Endocrine: No change in hair/skin/nails, excessive thirst, excessive hunger, excessive urination  Hematologic/lymphatic: No significant bruising, lymphadenopathy, abnormal bleeding  Physical exam:  Pertinent or positive findings: He appears his age and somewhat suboptimally nourished.  Eyebrows are decreased laterally.  Arcus senilis is present.  He has a slight gallop cadence.  Pedal pulses are decreased.  There is marked atrophy of the limbs and marked interosseous wasting.  The right lower extremity is stronger than the left lower extremity.  Any difference in strength in the upper extremities to opposition is subtle.  He has isolated flexion contractures of the fingers.  There are some laxity of the DIP joints suggested.  General appearance:  no acute distress, increased work of breathing is present.   Lymphatic: No lymphadenopathy about the head, neck, axilla. Eyes: No conjunctival inflammation or lid edema is present. There is no scleral icterus. Ears:  External ear exam shows no significant lesions or deformities.   Nose:  External nasal examination shows  no deformity or inflammation. Nasal mucosa are pink and moist without lesions, exudates Oral exam:  Lips and gums are healthy  appearing. There is no oropharyngeal erythema or exudate. Neck:  No thyromegaly, masses, tenderness noted.    Heart:  No murmur, click, rub .  Lungs: Chest clear to auscultation without wheezes, rhonchi, rales, rubs. Abdomen: Bowel sounds are normal. Abdomen is soft and nontender with no organomegaly, hernias, masses. GU: Deferred  Extremities:  No cyanosis, clubbing, edema  Neurologic exam :Balance, Rhomberg, finger to nose testing could not be completed due to clinical state Skin: Warm & dry w/o tenting. No significant lesions or rash.  See summary under each active problem in the Problem List with associated updated therapeutic plan

## 2022-01-27 NOTE — Assessment & Plan Note (Signed)
CKD has been relatively stable serially with CKD stage IIIa.  No change indicated unless there is progression of CKD.

## 2022-01-27 NOTE — Assessment & Plan Note (Signed)
Current TSH is therapeutic with a value of 1.660 on 75 mcg of L-thyroxine.  No change indicated.  Annual monitor should suffice.

## 2022-01-27 NOTE — Assessment & Plan Note (Signed)
LDL was at goal of less than 70 in October 2022 on relatively high-dose atorvastatin.  Recheck in October appropriate.

## 2022-01-27 NOTE — Assessment & Plan Note (Signed)
Today he confabulates about going home and becoming extremely angry at his brother whom he calls "mentally challenged" and "spoiled by our mother."  He indicated that his mother died several months ago when actually she died at the age of 33 in Oct 19, 2010.

## 2022-01-27 NOTE — Assessment & Plan Note (Addendum)
In October 2022 diabetes was poorly controlled; present A1c value of 5.3% is nondiabetic.  Possible discontinuation of Januvia will be discussed with NP.

## 2022-01-27 NOTE — Assessment & Plan Note (Signed)
Serially normochromic, normocytic anemia has been relatively stable.  Most current values are 11.8/34.8.  He remains on low-dose prophylactic aspirin because of a history of diabetes which is now controlled as well as dyslipidemia.

## 2022-01-27 NOTE — Patient Instructions (Signed)
See assessment and plan under each diagnosis in the problem list and acutely for this visit 

## 2022-01-27 NOTE — Assessment & Plan Note (Signed)
Blood pressure remains well controlled off antihypertensive medications.  No change indicated.

## 2022-02-08 DIAGNOSIS — I63541 Cerebral infarction due to unspecified occlusion or stenosis of right cerebellar artery: Secondary | ICD-10-CM | POA: Diagnosis not present

## 2022-02-08 DIAGNOSIS — R293 Abnormal posture: Secondary | ICD-10-CM | POA: Diagnosis not present

## 2022-02-08 DIAGNOSIS — Z9181 History of falling: Secondary | ICD-10-CM | POA: Diagnosis not present

## 2022-02-08 DIAGNOSIS — Z741 Need for assistance with personal care: Secondary | ICD-10-CM | POA: Diagnosis not present

## 2022-02-08 DIAGNOSIS — M6281 Muscle weakness (generalized): Secondary | ICD-10-CM | POA: Diagnosis not present

## 2022-02-08 DIAGNOSIS — R270 Ataxia, unspecified: Secondary | ICD-10-CM | POA: Diagnosis not present

## 2022-02-08 DIAGNOSIS — G463 Brain stem stroke syndrome: Secondary | ICD-10-CM | POA: Diagnosis not present

## 2022-02-09 DIAGNOSIS — R270 Ataxia, unspecified: Secondary | ICD-10-CM | POA: Diagnosis not present

## 2022-02-09 DIAGNOSIS — R293 Abnormal posture: Secondary | ICD-10-CM | POA: Diagnosis not present

## 2022-02-09 DIAGNOSIS — I63541 Cerebral infarction due to unspecified occlusion or stenosis of right cerebellar artery: Secondary | ICD-10-CM | POA: Diagnosis not present

## 2022-02-09 DIAGNOSIS — G463 Brain stem stroke syndrome: Secondary | ICD-10-CM | POA: Diagnosis not present

## 2022-02-09 DIAGNOSIS — M6281 Muscle weakness (generalized): Secondary | ICD-10-CM | POA: Diagnosis not present

## 2022-02-09 DIAGNOSIS — Z9181 History of falling: Secondary | ICD-10-CM | POA: Diagnosis not present

## 2022-02-10 DIAGNOSIS — Z9181 History of falling: Secondary | ICD-10-CM | POA: Diagnosis not present

## 2022-02-10 DIAGNOSIS — R293 Abnormal posture: Secondary | ICD-10-CM | POA: Diagnosis not present

## 2022-02-10 DIAGNOSIS — G463 Brain stem stroke syndrome: Secondary | ICD-10-CM | POA: Diagnosis not present

## 2022-02-10 DIAGNOSIS — I63541 Cerebral infarction due to unspecified occlusion or stenosis of right cerebellar artery: Secondary | ICD-10-CM | POA: Diagnosis not present

## 2022-02-10 DIAGNOSIS — R270 Ataxia, unspecified: Secondary | ICD-10-CM | POA: Diagnosis not present

## 2022-02-10 DIAGNOSIS — M6281 Muscle weakness (generalized): Secondary | ICD-10-CM | POA: Diagnosis not present

## 2022-02-11 DIAGNOSIS — Z9181 History of falling: Secondary | ICD-10-CM | POA: Diagnosis not present

## 2022-02-11 DIAGNOSIS — M6281 Muscle weakness (generalized): Secondary | ICD-10-CM | POA: Diagnosis not present

## 2022-02-11 DIAGNOSIS — I63541 Cerebral infarction due to unspecified occlusion or stenosis of right cerebellar artery: Secondary | ICD-10-CM | POA: Diagnosis not present

## 2022-02-11 DIAGNOSIS — G463 Brain stem stroke syndrome: Secondary | ICD-10-CM | POA: Diagnosis not present

## 2022-02-11 DIAGNOSIS — R293 Abnormal posture: Secondary | ICD-10-CM | POA: Diagnosis not present

## 2022-02-11 DIAGNOSIS — R270 Ataxia, unspecified: Secondary | ICD-10-CM | POA: Diagnosis not present

## 2022-02-14 DIAGNOSIS — R270 Ataxia, unspecified: Secondary | ICD-10-CM | POA: Diagnosis not present

## 2022-02-14 DIAGNOSIS — Z9181 History of falling: Secondary | ICD-10-CM | POA: Diagnosis not present

## 2022-02-14 DIAGNOSIS — G463 Brain stem stroke syndrome: Secondary | ICD-10-CM | POA: Diagnosis not present

## 2022-02-14 DIAGNOSIS — M6281 Muscle weakness (generalized): Secondary | ICD-10-CM | POA: Diagnosis not present

## 2022-02-14 DIAGNOSIS — R293 Abnormal posture: Secondary | ICD-10-CM | POA: Diagnosis not present

## 2022-02-14 DIAGNOSIS — I63541 Cerebral infarction due to unspecified occlusion or stenosis of right cerebellar artery: Secondary | ICD-10-CM | POA: Diagnosis not present

## 2022-02-15 DIAGNOSIS — M6281 Muscle weakness (generalized): Secondary | ICD-10-CM | POA: Diagnosis not present

## 2022-02-15 DIAGNOSIS — F01A4 Vascular dementia, mild, with anxiety: Secondary | ICD-10-CM | POA: Diagnosis not present

## 2022-02-15 DIAGNOSIS — R270 Ataxia, unspecified: Secondary | ICD-10-CM | POA: Diagnosis not present

## 2022-02-15 DIAGNOSIS — R293 Abnormal posture: Secondary | ICD-10-CM | POA: Diagnosis not present

## 2022-02-15 DIAGNOSIS — Z9181 History of falling: Secondary | ICD-10-CM | POA: Diagnosis not present

## 2022-02-15 DIAGNOSIS — I63541 Cerebral infarction due to unspecified occlusion or stenosis of right cerebellar artery: Secondary | ICD-10-CM | POA: Diagnosis not present

## 2022-02-15 DIAGNOSIS — F321 Major depressive disorder, single episode, moderate: Secondary | ICD-10-CM | POA: Diagnosis not present

## 2022-02-15 DIAGNOSIS — F411 Generalized anxiety disorder: Secondary | ICD-10-CM | POA: Diagnosis not present

## 2022-02-15 DIAGNOSIS — G463 Brain stem stroke syndrome: Secondary | ICD-10-CM | POA: Diagnosis not present

## 2022-02-16 DIAGNOSIS — Z9181 History of falling: Secondary | ICD-10-CM | POA: Diagnosis not present

## 2022-02-16 DIAGNOSIS — G463 Brain stem stroke syndrome: Secondary | ICD-10-CM | POA: Diagnosis not present

## 2022-02-16 DIAGNOSIS — R270 Ataxia, unspecified: Secondary | ICD-10-CM | POA: Diagnosis not present

## 2022-02-16 DIAGNOSIS — R293 Abnormal posture: Secondary | ICD-10-CM | POA: Diagnosis not present

## 2022-02-16 DIAGNOSIS — M6281 Muscle weakness (generalized): Secondary | ICD-10-CM | POA: Diagnosis not present

## 2022-02-16 DIAGNOSIS — I63541 Cerebral infarction due to unspecified occlusion or stenosis of right cerebellar artery: Secondary | ICD-10-CM | POA: Diagnosis not present

## 2022-02-17 DIAGNOSIS — G463 Brain stem stroke syndrome: Secondary | ICD-10-CM | POA: Diagnosis not present

## 2022-02-17 DIAGNOSIS — M6281 Muscle weakness (generalized): Secondary | ICD-10-CM | POA: Diagnosis not present

## 2022-02-17 DIAGNOSIS — R293 Abnormal posture: Secondary | ICD-10-CM | POA: Diagnosis not present

## 2022-02-17 DIAGNOSIS — R270 Ataxia, unspecified: Secondary | ICD-10-CM | POA: Diagnosis not present

## 2022-02-17 DIAGNOSIS — Z9181 History of falling: Secondary | ICD-10-CM | POA: Diagnosis not present

## 2022-02-17 DIAGNOSIS — I63541 Cerebral infarction due to unspecified occlusion or stenosis of right cerebellar artery: Secondary | ICD-10-CM | POA: Diagnosis not present

## 2022-02-18 DIAGNOSIS — G463 Brain stem stroke syndrome: Secondary | ICD-10-CM | POA: Diagnosis not present

## 2022-02-18 DIAGNOSIS — I63541 Cerebral infarction due to unspecified occlusion or stenosis of right cerebellar artery: Secondary | ICD-10-CM | POA: Diagnosis not present

## 2022-02-18 DIAGNOSIS — R270 Ataxia, unspecified: Secondary | ICD-10-CM | POA: Diagnosis not present

## 2022-02-18 DIAGNOSIS — R293 Abnormal posture: Secondary | ICD-10-CM | POA: Diagnosis not present

## 2022-02-18 DIAGNOSIS — Z9181 History of falling: Secondary | ICD-10-CM | POA: Diagnosis not present

## 2022-02-18 DIAGNOSIS — M6281 Muscle weakness (generalized): Secondary | ICD-10-CM | POA: Diagnosis not present

## 2022-02-21 DIAGNOSIS — R293 Abnormal posture: Secondary | ICD-10-CM | POA: Diagnosis not present

## 2022-02-21 DIAGNOSIS — M6281 Muscle weakness (generalized): Secondary | ICD-10-CM | POA: Diagnosis not present

## 2022-02-21 DIAGNOSIS — R270 Ataxia, unspecified: Secondary | ICD-10-CM | POA: Diagnosis not present

## 2022-02-21 DIAGNOSIS — Z9181 History of falling: Secondary | ICD-10-CM | POA: Diagnosis not present

## 2022-02-21 DIAGNOSIS — I63541 Cerebral infarction due to unspecified occlusion or stenosis of right cerebellar artery: Secondary | ICD-10-CM | POA: Diagnosis not present

## 2022-02-21 DIAGNOSIS — G463 Brain stem stroke syndrome: Secondary | ICD-10-CM | POA: Diagnosis not present

## 2022-02-22 DIAGNOSIS — I63541 Cerebral infarction due to unspecified occlusion or stenosis of right cerebellar artery: Secondary | ICD-10-CM | POA: Diagnosis not present

## 2022-02-22 DIAGNOSIS — G463 Brain stem stroke syndrome: Secondary | ICD-10-CM | POA: Diagnosis not present

## 2022-02-22 DIAGNOSIS — R293 Abnormal posture: Secondary | ICD-10-CM | POA: Diagnosis not present

## 2022-02-22 DIAGNOSIS — M6281 Muscle weakness (generalized): Secondary | ICD-10-CM | POA: Diagnosis not present

## 2022-02-22 DIAGNOSIS — Z9181 History of falling: Secondary | ICD-10-CM | POA: Diagnosis not present

## 2022-02-22 DIAGNOSIS — R270 Ataxia, unspecified: Secondary | ICD-10-CM | POA: Diagnosis not present

## 2022-02-23 DIAGNOSIS — G463 Brain stem stroke syndrome: Secondary | ICD-10-CM | POA: Diagnosis not present

## 2022-02-23 DIAGNOSIS — Z9181 History of falling: Secondary | ICD-10-CM | POA: Diagnosis not present

## 2022-02-23 DIAGNOSIS — R270 Ataxia, unspecified: Secondary | ICD-10-CM | POA: Diagnosis not present

## 2022-02-23 DIAGNOSIS — I63541 Cerebral infarction due to unspecified occlusion or stenosis of right cerebellar artery: Secondary | ICD-10-CM | POA: Diagnosis not present

## 2022-02-23 DIAGNOSIS — M6281 Muscle weakness (generalized): Secondary | ICD-10-CM | POA: Diagnosis not present

## 2022-02-23 DIAGNOSIS — F411 Generalized anxiety disorder: Secondary | ICD-10-CM | POA: Diagnosis not present

## 2022-02-23 DIAGNOSIS — R293 Abnormal posture: Secondary | ICD-10-CM | POA: Diagnosis not present

## 2022-02-24 DIAGNOSIS — G463 Brain stem stroke syndrome: Secondary | ICD-10-CM | POA: Diagnosis not present

## 2022-02-24 DIAGNOSIS — R293 Abnormal posture: Secondary | ICD-10-CM | POA: Diagnosis not present

## 2022-02-24 DIAGNOSIS — Z9181 History of falling: Secondary | ICD-10-CM | POA: Diagnosis not present

## 2022-02-24 DIAGNOSIS — R270 Ataxia, unspecified: Secondary | ICD-10-CM | POA: Diagnosis not present

## 2022-02-24 DIAGNOSIS — M6281 Muscle weakness (generalized): Secondary | ICD-10-CM | POA: Diagnosis not present

## 2022-02-24 DIAGNOSIS — I63541 Cerebral infarction due to unspecified occlusion or stenosis of right cerebellar artery: Secondary | ICD-10-CM | POA: Diagnosis not present

## 2022-02-25 DIAGNOSIS — Z741 Need for assistance with personal care: Secondary | ICD-10-CM | POA: Diagnosis not present

## 2022-02-25 DIAGNOSIS — Z9181 History of falling: Secondary | ICD-10-CM | POA: Diagnosis not present

## 2022-02-25 DIAGNOSIS — R293 Abnormal posture: Secondary | ICD-10-CM | POA: Diagnosis not present

## 2022-02-25 DIAGNOSIS — I63541 Cerebral infarction due to unspecified occlusion or stenosis of right cerebellar artery: Secondary | ICD-10-CM | POA: Diagnosis not present

## 2022-02-25 DIAGNOSIS — R270 Ataxia, unspecified: Secondary | ICD-10-CM | POA: Diagnosis not present

## 2022-02-25 DIAGNOSIS — M6281 Muscle weakness (generalized): Secondary | ICD-10-CM | POA: Diagnosis not present

## 2022-02-25 DIAGNOSIS — G463 Brain stem stroke syndrome: Secondary | ICD-10-CM | POA: Diagnosis not present

## 2022-02-28 DIAGNOSIS — Z9181 History of falling: Secondary | ICD-10-CM | POA: Diagnosis not present

## 2022-02-28 DIAGNOSIS — M6281 Muscle weakness (generalized): Secondary | ICD-10-CM | POA: Diagnosis not present

## 2022-02-28 DIAGNOSIS — G463 Brain stem stroke syndrome: Secondary | ICD-10-CM | POA: Diagnosis not present

## 2022-02-28 DIAGNOSIS — I63541 Cerebral infarction due to unspecified occlusion or stenosis of right cerebellar artery: Secondary | ICD-10-CM | POA: Diagnosis not present

## 2022-02-28 DIAGNOSIS — R293 Abnormal posture: Secondary | ICD-10-CM | POA: Diagnosis not present

## 2022-02-28 DIAGNOSIS — R270 Ataxia, unspecified: Secondary | ICD-10-CM | POA: Diagnosis not present

## 2022-03-01 ENCOUNTER — Encounter: Payer: Self-pay | Admitting: Adult Health

## 2022-03-01 ENCOUNTER — Non-Acute Institutional Stay (SKILLED_NURSING_FACILITY): Payer: Medicare Other | Admitting: Adult Health

## 2022-03-01 DIAGNOSIS — R293 Abnormal posture: Secondary | ICD-10-CM | POA: Diagnosis not present

## 2022-03-01 DIAGNOSIS — E1159 Type 2 diabetes mellitus with other circulatory complications: Secondary | ICD-10-CM

## 2022-03-01 DIAGNOSIS — I152 Hypertension secondary to endocrine disorders: Secondary | ICD-10-CM | POA: Diagnosis not present

## 2022-03-01 DIAGNOSIS — M810 Age-related osteoporosis without current pathological fracture: Secondary | ICD-10-CM | POA: Diagnosis not present

## 2022-03-01 DIAGNOSIS — Z9181 History of falling: Secondary | ICD-10-CM | POA: Diagnosis not present

## 2022-03-01 DIAGNOSIS — K5909 Other constipation: Secondary | ICD-10-CM | POA: Diagnosis not present

## 2022-03-01 DIAGNOSIS — I63541 Cerebral infarction due to unspecified occlusion or stenosis of right cerebellar artery: Secondary | ICD-10-CM | POA: Diagnosis not present

## 2022-03-01 DIAGNOSIS — E1149 Type 2 diabetes mellitus with other diabetic neurological complication: Secondary | ICD-10-CM | POA: Diagnosis not present

## 2022-03-01 DIAGNOSIS — G463 Brain stem stroke syndrome: Secondary | ICD-10-CM | POA: Diagnosis not present

## 2022-03-01 DIAGNOSIS — M6281 Muscle weakness (generalized): Secondary | ICD-10-CM | POA: Diagnosis not present

## 2022-03-01 DIAGNOSIS — R270 Ataxia, unspecified: Secondary | ICD-10-CM | POA: Diagnosis not present

## 2022-03-01 NOTE — Progress Notes (Signed)
Location:  [facility] Room Number: 111 Place of Service:  SNF (31)   CODE STATUS: dnr   No Known Allergies  Chief Complaint  Patient presents with   Medical Management of Chronic Issues              Type 2 diabetes mellitus with neurological manifestations;  Osteoporosis:  Hypertension associated with type 2 diabetes mellitus: Chronic constipation:    HPI:  He is a 76 year old long term resident of this facility being seen for the management of his chronic illnesses: Type 2 diabetes mellitus with neurological manifestations;  Osteoporosis:  Hypertension associated with type 2 diabetes mellitus: Chronic constipation:. There are no reports of uncontrolled pain. His weight is stable he continues to get out of bed daily spends a lot of time in the dayroom   Past Medical History:  Diagnosis Date   Hypertension    Benign Essential   Hypothyroidism    Obesity    Osteoarthritis    PVD (peripheral vascular disease) (HCC)    Type 2 diabetes mellitus with vascular complications     Past Surgical History:  Procedure Laterality Date   APPENDECTOMY     TONSILLECTOMY      Social History   Socioeconomic History   Marital status: Single    Spouse name: Not on file   Number of children: Not on file   Years of education: 12   Highest education level: Not on file  Occupational History   Occupation: RETIRED  Tobacco Use   Smoking status: Former   Smokeless tobacco: Never  Building services engineer Use: Never used  Substance and Sexual Activity   Alcohol use: Not Currently   Drug use: Never   Sexual activity: Not Currently  Other Topics Concern   Not on file  Social History Narrative   Not on file   Social Determinants of Health   Financial Resource Strain: Not on file  Food Insecurity: Not on file  Transportation Needs: Not on file  Physical Activity: Not on file  Stress: Not on file  Social Connections: Not on file  Intimate Partner Violence: Not  on file   Family History  Problem Relation Age of Onset   Osteoporosis Mother    Hypertension Mother    Cancer Mother    Heart attack Father    Heart attack Maternal Grandmother    Pneumonia Maternal Grandfather    Heart disease Paternal Grandmother    Heart disease Paternal Grandfather       VITAL SIGNS BP 122/72   Pulse 78   Temp (!) 97.2 F (36.2 C)   Resp (!) 22   Ht 5\' 11"  (1.803 m)   Wt 140 lb 3.2 oz (63.6 kg)   SpO2 98%   BMI 19.55 kg/m   Outpatient Encounter Medications as of 03/01/2022  Medication Sig   aspirin EC 81 MG tablet Take 162 mg by mouth daily. Swallow whole.   atorvastatin (LIPITOR) 40 MG tablet Take 40 mg by mouth daily.   escitalopram (LEXAPRO) 10 MG tablet Take 10 mg by mouth daily.   levothyroxine (SYNTHROID) 75 MCG tablet Take 75 mcg by mouth daily before breakfast.   memantine (NAMENDA XR) 28 MG CP24 24 hr capsule Take 28 mg by mouth daily. 9 am   miconazole (ZEASORB-AF) 2 % powder Apply 1 application topically as needed for itching (to groin and scrotum area substituted with shower to).   NON FORMULARY Diet:Regular Diet  with Thin Liquids   omeprazole (PRILOSEC OTC) 20 MG tablet Take 20 mg by mouth at bedtime.   ondansetron (ZOFRAN-ODT) 8 MG disintegrating tablet Take 8 mg by mouth daily as needed.   senna-docusate (SENOKOT-S) 8.6-50 MG tablet Take 2 tablets by mouth 2 (two) times daily as needed. hold for diarrhea   sitaGLIPtin (JANUVIA) 50 MG tablet Take 50 mg by mouth daily.   triamcinolone cream (KENALOG) 0.1 % Apply 1 application topically 2 (two) times daily. Apply to all areas of psoriasis   No facility-administered encounter medications on file as of 03/01/2022.     SIGNIFICANT DIAGNOSTIC EXAMS  PREVIOUS  10-01-21: chest x-ray: no infiltrate; no effusion; no acute findings.    TODAY  02-09-22: dex scan: t score -2.863   LABS REVIEWED: PREVIOUS   03-12-21: wbc 9.9; hgb 13.1; hct 39.8; mcv 95.4 plt 296; glucose 147; bun 23; creat  1.25; k+ 4.5; na++ 140; ca 9.0; GFR 59; liver normal albumin 3.3 03-17-21: tsh 2.566 04-08-21: chol 125; ldl 58; trig 213; hdl 24; hgb a1c 6.1; liver normal albumin 3.5;  04-27-21: urine micro albumin 7.2  08-12-21: hgb a1c 5.4  10-01-21: wbc 9.3; hgb 11.8; hct 34.8; mcv 95.3 plt 185; glucose 107; bun 31; creat 1.44; k+ 4.5; na++ 138; ca 8.6; GFR 50; d-dimer 1.44 CRP 0.8    TODAY  11-04-21 tsh 1.660 01-04-22: hgb a1c 5.3   Review of Systems  Constitutional:  Negative for malaise/fatigue.  Respiratory:  Negative for cough and shortness of breath.   Cardiovascular:  Negative for chest pain, palpitations and leg swelling.  Gastrointestinal:  Negative for abdominal pain, constipation and heartburn.  Musculoskeletal:  Negative for back pain, joint pain and myalgias.  Skin: Negative.   Neurological:  Negative for dizziness.  Psychiatric/Behavioral:  The patient is not nervous/anxious.    Physical Exam Constitutional:      General: He is not in acute distress.    Appearance: He is well-developed. He is not diaphoretic.  Neck:     Thyroid: No thyromegaly.  Cardiovascular:     Rate and Rhythm: Normal rate and regular rhythm.     Pulses: Normal pulses.     Heart sounds: Normal heart sounds.  Pulmonary:     Effort: Pulmonary effort is normal. No respiratory distress.     Breath sounds: Normal breath sounds.  Abdominal:     General: Bowel sounds are normal. There is no distension.     Palpations: Abdomen is soft.     Tenderness: There is no abdominal tenderness.  Musculoskeletal:        General: Normal range of motion.     Cervical back: Neck supple.     Right lower leg: No edema.     Left lower leg: No edema.  Lymphadenopathy:     Cervical: No cervical adenopathy.  Skin:    General: Skin is warm and dry.  Neurological:     Mental Status: He is alert. Mental status is at baseline.  Psychiatric:        Mood and Affect: Mood normal.           ASSESSMENT/ PLAN:  TODAY  Type 2  diabetes mellitus with neurological manifestations; stable hgb a1c 5.3; januvia has been stopped.   2. Osteoporosis: t score: -2.863 will begin fosamax 70 mg weekly   3. Hypertension associated with type 2 diabetes mellitus: will continue prilosec 20 mg daily   4. Chronic constipation: will continue senna s 2 tabs twice daily  as needed.   PREVIOUS   5. History of CVA: will continue asa 162 mg daily off plavix  6. Thyroid disease: tsh 1.660 will continue synthroid 75 mcg daily  7. Hemiplegia and hemiparesis following cerebral infections unspecified side  8. Vascular dementia without behavioral disturbance;  psychotic disturbance mood disturbance or anxiety: weight is 140 pounds.   9. CKD stage 3 associated with type 2 diabetes mellitus.   10. Mild protein calorie malnutrition: albumin 3.2 will continue supplements as directed  11. Hyperlipidemia associated with type 2 diabetes mellitus:LDL 58 will continue lipitor 40 mg daily     Synthia Innocent NP [facility]   call [phone]

## 2022-03-02 DIAGNOSIS — M6281 Muscle weakness (generalized): Secondary | ICD-10-CM | POA: Diagnosis not present

## 2022-03-02 DIAGNOSIS — R293 Abnormal posture: Secondary | ICD-10-CM | POA: Diagnosis not present

## 2022-03-02 DIAGNOSIS — G463 Brain stem stroke syndrome: Secondary | ICD-10-CM | POA: Diagnosis not present

## 2022-03-02 DIAGNOSIS — Z9181 History of falling: Secondary | ICD-10-CM | POA: Diagnosis not present

## 2022-03-02 DIAGNOSIS — R270 Ataxia, unspecified: Secondary | ICD-10-CM | POA: Diagnosis not present

## 2022-03-02 DIAGNOSIS — I63541 Cerebral infarction due to unspecified occlusion or stenosis of right cerebellar artery: Secondary | ICD-10-CM | POA: Diagnosis not present

## 2022-03-03 DIAGNOSIS — I63541 Cerebral infarction due to unspecified occlusion or stenosis of right cerebellar artery: Secondary | ICD-10-CM | POA: Diagnosis not present

## 2022-03-03 DIAGNOSIS — R293 Abnormal posture: Secondary | ICD-10-CM | POA: Diagnosis not present

## 2022-03-03 DIAGNOSIS — R270 Ataxia, unspecified: Secondary | ICD-10-CM | POA: Diagnosis not present

## 2022-03-03 DIAGNOSIS — Z9181 History of falling: Secondary | ICD-10-CM | POA: Diagnosis not present

## 2022-03-03 DIAGNOSIS — M6281 Muscle weakness (generalized): Secondary | ICD-10-CM | POA: Diagnosis not present

## 2022-03-03 DIAGNOSIS — G463 Brain stem stroke syndrome: Secondary | ICD-10-CM | POA: Diagnosis not present

## 2022-03-04 DIAGNOSIS — R293 Abnormal posture: Secondary | ICD-10-CM | POA: Diagnosis not present

## 2022-03-04 DIAGNOSIS — M6281 Muscle weakness (generalized): Secondary | ICD-10-CM | POA: Diagnosis not present

## 2022-03-04 DIAGNOSIS — Z9181 History of falling: Secondary | ICD-10-CM | POA: Diagnosis not present

## 2022-03-04 DIAGNOSIS — G463 Brain stem stroke syndrome: Secondary | ICD-10-CM | POA: Diagnosis not present

## 2022-03-04 DIAGNOSIS — I63541 Cerebral infarction due to unspecified occlusion or stenosis of right cerebellar artery: Secondary | ICD-10-CM | POA: Diagnosis not present

## 2022-03-04 DIAGNOSIS — R270 Ataxia, unspecified: Secondary | ICD-10-CM | POA: Diagnosis not present

## 2022-03-07 DIAGNOSIS — I63541 Cerebral infarction due to unspecified occlusion or stenosis of right cerebellar artery: Secondary | ICD-10-CM | POA: Diagnosis not present

## 2022-03-07 DIAGNOSIS — R270 Ataxia, unspecified: Secondary | ICD-10-CM | POA: Diagnosis not present

## 2022-03-07 DIAGNOSIS — Z9181 History of falling: Secondary | ICD-10-CM | POA: Diagnosis not present

## 2022-03-07 DIAGNOSIS — M6281 Muscle weakness (generalized): Secondary | ICD-10-CM | POA: Diagnosis not present

## 2022-03-07 DIAGNOSIS — G463 Brain stem stroke syndrome: Secondary | ICD-10-CM | POA: Diagnosis not present

## 2022-03-07 DIAGNOSIS — R293 Abnormal posture: Secondary | ICD-10-CM | POA: Diagnosis not present

## 2022-03-08 DIAGNOSIS — Z9181 History of falling: Secondary | ICD-10-CM | POA: Diagnosis not present

## 2022-03-08 DIAGNOSIS — R270 Ataxia, unspecified: Secondary | ICD-10-CM | POA: Diagnosis not present

## 2022-03-08 DIAGNOSIS — M6281 Muscle weakness (generalized): Secondary | ICD-10-CM | POA: Diagnosis not present

## 2022-03-08 DIAGNOSIS — I63541 Cerebral infarction due to unspecified occlusion or stenosis of right cerebellar artery: Secondary | ICD-10-CM | POA: Diagnosis not present

## 2022-03-08 DIAGNOSIS — R293 Abnormal posture: Secondary | ICD-10-CM | POA: Diagnosis not present

## 2022-03-08 DIAGNOSIS — G463 Brain stem stroke syndrome: Secondary | ICD-10-CM | POA: Diagnosis not present

## 2022-03-09 DIAGNOSIS — F411 Generalized anxiety disorder: Secondary | ICD-10-CM | POA: Diagnosis not present

## 2022-03-10 ENCOUNTER — Encounter: Payer: Self-pay | Admitting: Adult Health

## 2022-03-10 ENCOUNTER — Non-Acute Institutional Stay (SKILLED_NURSING_FACILITY): Payer: Medicare Other | Admitting: Adult Health

## 2022-03-10 DIAGNOSIS — E1122 Type 2 diabetes mellitus with diabetic chronic kidney disease: Secondary | ICD-10-CM

## 2022-03-10 DIAGNOSIS — I69359 Hemiplegia and hemiparesis following cerebral infarction affecting unspecified side: Secondary | ICD-10-CM | POA: Diagnosis not present

## 2022-03-10 DIAGNOSIS — N183 Chronic kidney disease, stage 3 unspecified: Secondary | ICD-10-CM

## 2022-03-10 DIAGNOSIS — I700 Atherosclerosis of aorta: Secondary | ICD-10-CM

## 2022-03-10 NOTE — Progress Notes (Signed)
Location:  [facility] Room Number: 111-W Place of Service:  SNF (31) Provider: Synthia Innocent, NP  CODE STATUS: DNR  No Known Allergies  Chief Complaint  Patient presents with   Acute Visit    Care plan meeting.     HPI:  We have come together for his care plan meeting. BIMS 13/15 mood 0/30. He is nonambulatory and has had no falls. He requires extensive to dependent assist with his adls. He is incontinent of bladder and bowel. His cbg readings are stable. Dietary: supervision with meals. weight is 140.2 pounds; 50-100% of meals; regular diet with finger foods. Therapy: has completed for wheelchair positioning. Activities: day room westerns .  He will continue to be followed for his chronic illnesses including:   Aortic atherosclerosis  CKD stage 3 due to type 2 diabetes mellitus  Hemiplegia and hemiparesis following cerebral infarction affected unspecified side  Past Medical History:  Diagnosis Date   Hypertension    Benign Essential   Hypothyroidism    Obesity    Osteoarthritis    PVD (peripheral vascular disease) (HCC)    Type 2 diabetes mellitus with vascular complications     Past Surgical History:  Procedure Laterality Date   APPENDECTOMY     TONSILLECTOMY      Social History   Socioeconomic History   Marital status: Single    Spouse name: Not on file   Number of children: Not on file   Years of education: 12   Highest education level: Not on file  Occupational History   Occupation: RETIRED  Tobacco Use   Smoking status: Former   Smokeless tobacco: Never  Building services engineer Use: Never used  Substance and Sexual Activity   Alcohol use: Not Currently   Drug use: Never   Sexual activity: Not Currently  Other Topics Concern   Not on file  Social History Narrative   Not on file   Social Determinants of Health   Financial Resource Strain: Not on file  Food Insecurity: Not on file  Transportation Needs: Not on file  Physical  Activity: Not on file  Stress: Not on file  Social Connections: Not on file  Intimate Partner Violence: Not on file   Family History  Problem Relation Age of Onset   Osteoporosis Mother    Hypertension Mother    Cancer Mother    Heart attack Father    Heart attack Maternal Grandmother    Pneumonia Maternal Grandfather    Heart disease Paternal Grandmother    Heart disease Paternal Grandfather       VITAL SIGNS BP (!) 99/55   Pulse 65   Temp 97.6 F (36.4 C)   Resp 20   Ht 5\' 11"  (1.803 m)   Wt 144 lb 12.8 oz (65.7 kg)   SpO2 94%   BMI 20.20 kg/m   Outpatient Encounter Medications as of 03/10/2022  Medication Sig   alendronate (FOSAMAX) 70 MG tablet Take 70 mg by mouth once a week. Take with a full glass of water on an empty stomach on Thursday.   aspirin EC 81 MG tablet Take 162 mg by mouth daily. Swallow whole.   atorvastatin (LIPITOR) 40 MG tablet Take 40 mg by mouth daily.   calcium carbonate (OS-CAL) 600 MG TABS tablet Take 600 mg by mouth daily.   Cholecalciferol (VITAMIN D) 50 MCG (2000 UT) tablet Take 2,000 Units by mouth daily.   levothyroxine (SYNTHROID) 75 MCG tablet Take 75  mcg by mouth daily before breakfast.   memantine (NAMENDA XR) 28 MG CP24 24 hr capsule Take 28 mg by mouth daily. 9 am   NON FORMULARY Diet:Regular Diet with Thin Liquids   omeprazole (PRILOSEC OTC) 20 MG tablet Take 20 mg by mouth at bedtime.   ondansetron (ZOFRAN-ODT) 8 MG disintegrating tablet Take 8 mg by mouth daily as needed.   senna-docusate (SENOKOT-S) 8.6-50 MG tablet Take 2 tablets by mouth 2 (two) times daily as needed. hold for diarrhea   sertraline (ZOLOFT) 50 MG tablet Take 50 mg by mouth daily.   triamcinolone cream (KENALOG) 0.1 % Apply 1 application  topically 2 (two) times daily as needed. Apply to all areas of psoriasis   [DISCONTINUED] escitalopram (LEXAPRO) 10 MG tablet Take 10 mg by mouth daily.   [DISCONTINUED] miconazole (ZEASORB-AF) 2 % powder Apply 1 application  topically as needed for itching (to groin and scrotum area substituted with shower to).   No facility-administered encounter medications on file as of 03/10/2022.     SIGNIFICANT DIAGNOSTIC EXAMS  PREVIOUS  10-01-21: chest x-ray: no infiltrate; no effusion; no acute findings.    TODAY  02-09-22: dex scan: t score -2.863   LABS REVIEWED: PREVIOUS   03-12-21: wbc 9.9; hgb 13.1; hct 39.8; mcv 95.4 plt 296; glucose 147; bun 23; creat 1.25; k+ 4.5; na++ 140; ca 9.0; GFR 59; liver normal albumin 3.3 03-17-21: tsh 2.566 04-08-21: chol 125; ldl 58; trig 213; hdl 24; hgb a1c 6.1; liver normal albumin 3.5;  04-27-21: urine micro albumin 7.2  08-12-21: hgb a1c 5.4  10-01-21: wbc 9.3; hgb 11.8; hct 34.8; mcv 95.3 plt 185; glucose 107; bun 31; creat 1.44; k+ 4.5; na++ 138; ca 8.6; GFR 50; d-dimer 1.44 CRP 0.8   11-04-21 tsh 1.660 01-04-22: hgb a1c 5.3  NO NEW LABS.    Review of Systems  Constitutional:  Negative for malaise/fatigue.  Respiratory:  Negative for cough and shortness of breath.   Cardiovascular:  Negative for chest pain, palpitations and leg swelling.  Gastrointestinal:  Negative for abdominal pain, constipation and heartburn.  Musculoskeletal:  Negative for back pain, joint pain and myalgias.  Skin: Negative.   Neurological:  Negative for dizziness.  Psychiatric/Behavioral:  The patient is not nervous/anxious.     Physical Exam Constitutional:      General: He is not in acute distress.    Appearance: He is well-developed. He is not diaphoretic.  Neck:     Thyroid: No thyromegaly.  Cardiovascular:     Rate and Rhythm: Normal rate and regular rhythm.     Pulses: Normal pulses.     Heart sounds: Normal heart sounds.  Pulmonary:     Effort: Pulmonary effort is normal. No respiratory distress.     Breath sounds: Normal breath sounds.  Abdominal:     General: Bowel sounds are normal. There is no distension.     Palpations: Abdomen is soft.     Tenderness: There is no abdominal  tenderness.  Musculoskeletal:        General: Normal range of motion.     Cervical back: Neck supple.     Right lower leg: No edema.     Left lower leg: No edema.  Lymphadenopathy:     Cervical: No cervical adenopathy.  Skin:    General: Skin is warm and dry.  Neurological:     Mental Status: He is alert. Mental status is at baseline.  Psychiatric:        Mood  and Affect: Mood normal.       ASSESSMENT/ PLAN:  TODAY  Aortic atherosclerosis CKD stage 3 due to type 2 diabetes mellitus  Hemiplegia and hemiparesis following cerebral infarction affected unspecified side  Will continue current medications Will continue current plan of care Will continue to monitor his status.   Time spent with patient 40 minutes: medications; plan of care dietary.    Ok Edwards NP [facility]   call [phone]

## 2022-03-16 DIAGNOSIS — F411 Generalized anxiety disorder: Secondary | ICD-10-CM | POA: Diagnosis not present

## 2022-03-17 ENCOUNTER — Other Ambulatory Visit (HOSPITAL_COMMUNITY)
Admission: RE | Admit: 2022-03-17 | Discharge: 2022-03-17 | Disposition: A | Discharge status: Home / Self Care | Payer: Medicare Other | Source: Skilled Nursing Facility | Attending: Adult Health | Admitting: Adult Health

## 2022-03-17 DIAGNOSIS — E039 Hypothyroidism, unspecified: Secondary | ICD-10-CM | POA: Diagnosis not present

## 2022-03-17 LAB — TSH: TSH: 1.804 u[IU]/mL (ref 0.350–4.500)

## 2022-03-22 DIAGNOSIS — F01A4 Vascular dementia, mild, with anxiety: Secondary | ICD-10-CM | POA: Diagnosis not present

## 2022-03-22 DIAGNOSIS — F411 Generalized anxiety disorder: Secondary | ICD-10-CM | POA: Diagnosis not present

## 2022-03-22 DIAGNOSIS — F321 Major depressive disorder, single episode, moderate: Secondary | ICD-10-CM | POA: Diagnosis not present

## 2022-03-23 DIAGNOSIS — F411 Generalized anxiety disorder: Secondary | ICD-10-CM | POA: Diagnosis not present

## 2022-03-29 DIAGNOSIS — Z23 Encounter for immunization: Secondary | ICD-10-CM | POA: Diagnosis not present

## 2022-03-30 DIAGNOSIS — F411 Generalized anxiety disorder: Secondary | ICD-10-CM | POA: Diagnosis not present

## 2022-04-06 ENCOUNTER — Non-Acute Institutional Stay (SKILLED_NURSING_FACILITY): Payer: Medicare Other | Admitting: Adult Health

## 2022-04-06 ENCOUNTER — Encounter: Payer: Self-pay | Admitting: Adult Health

## 2022-04-06 DIAGNOSIS — F01C0 Vascular dementia, severe, without behavioral disturbance, psychotic disturbance, mood disturbance, and anxiety: Secondary | ICD-10-CM

## 2022-04-06 DIAGNOSIS — K219 Gastro-esophageal reflux disease without esophagitis: Secondary | ICD-10-CM

## 2022-04-06 DIAGNOSIS — I69359 Hemiplegia and hemiparesis following cerebral infarction affecting unspecified side: Secondary | ICD-10-CM

## 2022-04-06 DIAGNOSIS — Z8673 Personal history of transient ischemic attack (TIA), and cerebral infarction without residual deficits: Secondary | ICD-10-CM | POA: Diagnosis not present

## 2022-04-06 DIAGNOSIS — Z1383 Encounter for screening for respiratory disorder NEC: Secondary | ICD-10-CM | POA: Diagnosis not present

## 2022-04-06 DIAGNOSIS — I69351 Hemiplegia and hemiparesis following cerebral infarction affecting right dominant side: Secondary | ICD-10-CM | POA: Diagnosis not present

## 2022-04-06 DIAGNOSIS — Z1159 Encounter for screening for other viral diseases: Secondary | ICD-10-CM | POA: Diagnosis not present

## 2022-04-06 DIAGNOSIS — F411 Generalized anxiety disorder: Secondary | ICD-10-CM | POA: Diagnosis not present

## 2022-04-06 NOTE — Progress Notes (Signed)
Location:  [facility] Room Number: NO/111/W Place of Service:  SNF (31) Aleaya Latona S.,NP  CODE STATUS: DNR  No Known Allergies  Chief Complaint  Patient presents with   Medical Management of Chronic Issues                                     History of CVA  Thyroid disease: Hemiplegia and hemiparesis following cerebral infarction unspecified site:  Vascular dementia without behavioral disturbance, psychotic disturbance mood disturbance or anxiety:    HPI:  He is a 76 year old long term resident of this facility being seen for the management of his chronic illnesses:  History of CVA  Thyroid disease: Hemiplegia and hemiparesis following cerebral infarction unspecified site:  Vascular dementia without behavioral disturbance, psychotic disturbance mood disturbance or anxiety. There are no reports of uncontrolled pain. There are no changes in appetite; weight is stable.   Past Medical History:  Diagnosis Date   Hypertension    Benign Essential   Hypothyroidism    Obesity    Osteoarthritis    PVD (peripheral vascular disease) (HCC)    Type 2 diabetes mellitus with vascular complications     Past Surgical History:  Procedure Laterality Date   APPENDECTOMY     TONSILLECTOMY      Social History   Socioeconomic History   Marital status: Single    Spouse name: Not on file   Number of children: Not on file   Years of education: 12   Highest education level: Not on file  Occupational History   Occupation: RETIRED  Tobacco Use   Smoking status: Former   Smokeless tobacco: Never  Scientific laboratory technician Use: Never used  Substance and Sexual Activity   Alcohol use: Not Currently   Drug use: Never   Sexual activity: Not Currently  Other Topics Concern   Not on file  Social History Narrative   Not on file   Social Determinants of Health   Financial Resource Strain: Not on file  Food Insecurity: Not on file  Transportation Needs: Not on file   Physical Activity: Not on file  Stress: Not on file  Social Connections: Not on file  Intimate Partner Violence: Not on file   Family History  Problem Relation Age of Onset   Osteoporosis Mother    Hypertension Mother    Cancer Mother    Heart attack Father    Heart attack Maternal Grandmother    Pneumonia Maternal Grandfather    Heart disease Paternal Grandmother    Heart disease Paternal Grandfather       VITAL SIGNS BP (!) 90/50   Pulse 88   Temp 97.7 F (36.5 C)   Resp 20   Ht 5\' 11"  (1.803 m)   Wt 144 lb (65.3 kg)   SpO2 95%   BMI 20.08 kg/m   Outpatient Encounter Medications as of 04/06/2022  Medication Sig   alendronate (FOSAMAX) 70 MG tablet Take 70 mg by mouth once a week. Take with a full glass of water on an empty stomach on Thursday.   aspirin EC 81 MG tablet Take 162 mg by mouth daily. Swallow whole.   atorvastatin (LIPITOR) 40 MG tablet Take 40 mg by mouth daily.   calcium carbonate (OS-CAL) 600 MG TABS tablet Take 600 mg by mouth daily.   Cholecalciferol (VITAMIN D) 50 MCG (2000 UT) tablet Take  2,000 Units by mouth daily.   levothyroxine (SYNTHROID) 75 MCG tablet Take 75 mcg by mouth daily before breakfast.   memantine (NAMENDA XR) 28 MG CP24 24 hr capsule Take 28 mg by mouth daily. 9 am   NON FORMULARY Diet:Regular Diet with Thin Liquids   omeprazole (PRILOSEC OTC) 20 MG tablet Take 20 mg by mouth at bedtime.   senna-docusate (SENOKOT-S) 8.6-50 MG tablet Take 2 tablets by mouth 2 (two) times daily as needed. hold for diarrhea   sertraline (ZOLOFT) 50 MG tablet Take 50 mg by mouth daily.   triamcinolone cream (KENALOG) 0.1 % Apply 1 application  topically 2 (two) times daily as needed. Apply to all areas of psoriasis   [DISCONTINUED] ondansetron (ZOFRAN-ODT) 8 MG disintegrating tablet Take 8 mg by mouth daily as needed.   No facility-administered encounter medications on file as of 04/06/2022.     SIGNIFICANT DIAGNOSTIC EXAMS  PREVIOUS   10-01-21:  chest x-ray: no infiltrate; no effusion; no acute findings.    02-09-22: dex scan: t score -2.863  NO NEW EXAMS.    LABS REVIEWED: PREVIOUS    04-08-21: chol 125; ldl 58; trig 213; hdl 24; hgb a1c 6.1; liver normal albumin 3.5;  04-27-21: urine micro albumin 7.2  08-12-21: hgb a1c 5.4  10-01-21: wbc 9.3; hgb 11.8; hct 34.8; mcv 95.3 plt 185; glucose 107; bun 31; creat 1.44; k+ 4.5; na++ 138; ca 8.6; GFR 50; d-dimer 1.44 CRP 0.8   11-04-21 tsh 1.660 01-04-22: hgb a1c 5.3   TODAY  03-17-22: tsh 1.804  Review of Systems  Constitutional:  Negative for malaise/fatigue.  Respiratory:  Negative for cough and shortness of breath.   Cardiovascular:  Negative for chest pain, palpitations and leg swelling.  Gastrointestinal:  Negative for abdominal pain, constipation and heartburn.  Musculoskeletal:  Negative for back pain, joint pain and myalgias.  Skin: Negative.   Neurological:  Negative for dizziness.  Psychiatric/Behavioral:  The patient is not nervous/anxious.    Physical Exam Constitutional:      General: He is not in acute distress.    Appearance: He is well-developed. He is not diaphoretic.  Neck:     Thyroid: No thyromegaly.  Cardiovascular:     Rate and Rhythm: Normal rate and regular rhythm.     Pulses: Normal pulses.     Heart sounds: Normal heart sounds.  Pulmonary:     Effort: Pulmonary effort is normal. No respiratory distress.     Breath sounds: Normal breath sounds.  Abdominal:     General: Bowel sounds are normal. There is no distension.     Palpations: Abdomen is soft.     Tenderness: There is no abdominal tenderness.  Musculoskeletal:        General: Normal range of motion.     Cervical back: Neck supple.     Right lower leg: No edema.     Left lower leg: No edema.  Lymphadenopathy:     Cervical: No cervical adenopathy.  Skin:    General: Skin is warm and dry.  Neurological:     Mental Status: He is alert. Mental status is at baseline.  Psychiatric:         Mood and Affect: Mood normal.           ASSESSMENT/ PLAN:  TODAY  History of CVA will continue asa 162 mg daily is off plavix  2. Thyroid disease: tsh 1.804; will continue synthroid 75 mcg daily   3. Hemiplegia and hemiparesis following cerebral  infarction unspecified site: is on asa 162 mg daily   4. Vascular dementia without behavioral disturbance, psychotic disturbance mood disturbance or anxiety: weight is 144 pounds; will continue namenda xr 28 mg daily   PREVIOUS   5. CKD stage 3 associated with type 2 diabetes mellitus. Bun 31 creat 1.44 gfr 50   6. Mild protein calorie malnutrition: albumin 3.2 will continue supplements as directed  7. Hyperlipidemia associated with type 2 diabetes mellitus:LDL 58 will continue lipitor 40 mg daily   8. Type 2 diabetes mellitus with neurological manifestations; stable hgb a1c 5.3; januvia has been stopped.   9. Osteoporosis: t score: -2.863 will continue  fosamax 70 mg weekly   10. Hypertension associated with type 2 diabetes mellitus: b/p 90/50 currently not on medications.   11. Chronic constipation: will continue senna s 2 tabs twice daily as needed  12. GERD without esophagitis: will continue prilosec 20 mg daily    Will check cbc; cmp urine ACR lipids    Ok Edwards NP [facility]  call [phone]

## 2022-04-07 ENCOUNTER — Other Ambulatory Visit (HOSPITAL_COMMUNITY)
Admission: RE | Admit: 2022-04-07 | Discharge: 2022-04-07 | Disposition: A | Discharge status: Home / Self Care | Payer: Medicare Other | Source: Skilled Nursing Facility | Attending: Adult Health | Admitting: Adult Health

## 2022-04-07 DIAGNOSIS — E1149 Type 2 diabetes mellitus with other diabetic neurological complication: Secondary | ICD-10-CM | POA: Diagnosis not present

## 2022-04-07 LAB — CBC
HCT: 35.8 % — ABNORMAL LOW (ref 39.0–52.0)
Hemoglobin: 12 g/dL — ABNORMAL LOW (ref 13.0–17.0)
MCH: 32.3 pg (ref 26.0–34.0)
MCHC: 33.5 g/dL (ref 30.0–36.0)
MCV: 96.2 fL (ref 80.0–100.0)
Platelets: 201 10*3/uL (ref 150–400)
RBC: 3.72 MIL/uL — ABNORMAL LOW (ref 4.22–5.81)
RDW: 13.8 % (ref 11.5–15.5)
WBC: 6.6 10*3/uL (ref 4.0–10.5)
nRBC: 0 % (ref 0.0–0.2)

## 2022-04-07 LAB — COMPREHENSIVE METABOLIC PANEL
ALT: 97 U/L — ABNORMAL HIGH (ref 0–44)
AST: 38 U/L (ref 15–41)
Albumin: 2.8 g/dL — ABNORMAL LOW (ref 3.5–5.0)
Alkaline Phosphatase: 98 U/L (ref 38–126)
Anion gap: 5 (ref 5–15)
BUN: 31 mg/dL — ABNORMAL HIGH (ref 8–23)
CO2: 27 mmol/L (ref 22–32)
Calcium: 8.4 mg/dL — ABNORMAL LOW (ref 8.9–10.3)
Chloride: 108 mmol/L (ref 98–111)
Creatinine, Ser: 1.29 mg/dL — ABNORMAL HIGH (ref 0.61–1.24)
GFR, Estimated: 57 mL/min — ABNORMAL LOW (ref >60)
Glucose, Bld: 92 mg/dL (ref 70–99)
Potassium: 4.3 mmol/L (ref 3.5–5.1)
Sodium: 140 mmol/L (ref 135–145)
Total Bilirubin: 0.4 mg/dL (ref 0.3–1.2)
Total Protein: 5.6 g/dL — ABNORMAL LOW (ref 6.5–8.1)

## 2022-04-07 LAB — LIPID PANEL
Cholesterol: 98 mg/dL (ref 0–200)
HDL: 36 mg/dL — ABNORMAL LOW (ref >40)
LDL Cholesterol: 49 mg/dL (ref 0–99)
Total CHOL/HDL Ratio: 2.7 RATIO
Triglycerides: 67 mg/dL (ref <150)
VLDL: 13 mg/dL (ref 0–40)

## 2022-04-08 LAB — MICROALBUMIN / CREATININE URINE RATIO
Creatinine, Urine: 60.2 mg/dL
Microalb Creat Ratio: <5 mg/g creat (ref 0–29)
Microalb, Ur: <3 ug/mL — ABNORMAL HIGH

## 2022-04-13 DIAGNOSIS — F411 Generalized anxiety disorder: Secondary | ICD-10-CM | POA: Diagnosis not present

## 2022-04-20 DIAGNOSIS — F01A4 Vascular dementia, mild, with anxiety: Secondary | ICD-10-CM | POA: Diagnosis not present

## 2022-04-20 DIAGNOSIS — F411 Generalized anxiety disorder: Secondary | ICD-10-CM | POA: Diagnosis not present

## 2022-04-20 DIAGNOSIS — F321 Major depressive disorder, single episode, moderate: Secondary | ICD-10-CM | POA: Diagnosis not present

## 2022-04-20 DIAGNOSIS — Z23 Encounter for immunization: Secondary | ICD-10-CM | POA: Diagnosis not present

## 2022-04-27 DIAGNOSIS — M6281 Muscle weakness (generalized): Secondary | ICD-10-CM | POA: Diagnosis not present

## 2022-04-27 DIAGNOSIS — R279 Unspecified lack of coordination: Secondary | ICD-10-CM | POA: Diagnosis not present

## 2022-04-27 DIAGNOSIS — I69351 Hemiplegia and hemiparesis following cerebral infarction affecting right dominant side: Secondary | ICD-10-CM | POA: Diagnosis not present

## 2022-04-29 DIAGNOSIS — R279 Unspecified lack of coordination: Secondary | ICD-10-CM | POA: Diagnosis not present

## 2022-04-29 DIAGNOSIS — I69351 Hemiplegia and hemiparesis following cerebral infarction affecting right dominant side: Secondary | ICD-10-CM | POA: Diagnosis not present

## 2022-04-29 DIAGNOSIS — M6281 Muscle weakness (generalized): Secondary | ICD-10-CM | POA: Diagnosis not present

## 2022-04-30 DIAGNOSIS — I69351 Hemiplegia and hemiparesis following cerebral infarction affecting right dominant side: Secondary | ICD-10-CM | POA: Diagnosis not present

## 2022-04-30 DIAGNOSIS — M6281 Muscle weakness (generalized): Secondary | ICD-10-CM | POA: Diagnosis not present

## 2022-04-30 DIAGNOSIS — R279 Unspecified lack of coordination: Secondary | ICD-10-CM | POA: Diagnosis not present

## 2022-05-02 DIAGNOSIS — I69351 Hemiplegia and hemiparesis following cerebral infarction affecting right dominant side: Secondary | ICD-10-CM | POA: Diagnosis not present

## 2022-05-02 DIAGNOSIS — R279 Unspecified lack of coordination: Secondary | ICD-10-CM | POA: Diagnosis not present

## 2022-05-02 DIAGNOSIS — M6281 Muscle weakness (generalized): Secondary | ICD-10-CM | POA: Diagnosis not present

## 2022-05-03 DIAGNOSIS — M6281 Muscle weakness (generalized): Secondary | ICD-10-CM | POA: Diagnosis not present

## 2022-05-03 DIAGNOSIS — I69351 Hemiplegia and hemiparesis following cerebral infarction affecting right dominant side: Secondary | ICD-10-CM | POA: Diagnosis not present

## 2022-05-03 DIAGNOSIS — R279 Unspecified lack of coordination: Secondary | ICD-10-CM | POA: Diagnosis not present

## 2022-05-04 DIAGNOSIS — F411 Generalized anxiety disorder: Secondary | ICD-10-CM | POA: Diagnosis not present

## 2022-05-04 DIAGNOSIS — R279 Unspecified lack of coordination: Secondary | ICD-10-CM | POA: Diagnosis not present

## 2022-05-04 DIAGNOSIS — I69351 Hemiplegia and hemiparesis following cerebral infarction affecting right dominant side: Secondary | ICD-10-CM | POA: Diagnosis not present

## 2022-05-04 DIAGNOSIS — M6281 Muscle weakness (generalized): Secondary | ICD-10-CM | POA: Diagnosis not present

## 2022-05-05 ENCOUNTER — Encounter: Payer: Self-pay | Admitting: Internal Medicine

## 2022-05-05 ENCOUNTER — Non-Acute Institutional Stay (SKILLED_NURSING_FACILITY): Payer: Medicare Other | Admitting: Internal Medicine

## 2022-05-05 DIAGNOSIS — I10 Essential (primary) hypertension: Secondary | ICD-10-CM | POA: Diagnosis not present

## 2022-05-05 DIAGNOSIS — E039 Hypothyroidism, unspecified: Secondary | ICD-10-CM | POA: Diagnosis not present

## 2022-05-05 DIAGNOSIS — E1149 Type 2 diabetes mellitus with other diabetic neurological complication: Secondary | ICD-10-CM | POA: Diagnosis not present

## 2022-05-05 DIAGNOSIS — J3089 Other allergic rhinitis: Secondary | ICD-10-CM

## 2022-05-05 DIAGNOSIS — E1122 Type 2 diabetes mellitus with diabetic chronic kidney disease: Secondary | ICD-10-CM | POA: Diagnosis not present

## 2022-05-05 DIAGNOSIS — D649 Anemia, unspecified: Secondary | ICD-10-CM | POA: Diagnosis not present

## 2022-05-05 DIAGNOSIS — N183 Chronic kidney disease, stage 3 unspecified: Secondary | ICD-10-CM

## 2022-05-05 DIAGNOSIS — R279 Unspecified lack of coordination: Secondary | ICD-10-CM | POA: Diagnosis not present

## 2022-05-05 DIAGNOSIS — M6281 Muscle weakness (generalized): Secondary | ICD-10-CM | POA: Diagnosis not present

## 2022-05-05 DIAGNOSIS — I69351 Hemiplegia and hemiparesis following cerebral infarction affecting right dominant side: Secondary | ICD-10-CM | POA: Diagnosis not present

## 2022-05-05 NOTE — Assessment & Plan Note (Addendum)
Anemia improved: 10/01/21 H/H 11.8/34.8 04/07/22 H/H 12/35.8

## 2022-05-05 NOTE — Assessment & Plan Note (Signed)
03/17/22 TSH therapeutic @ 1.804

## 2022-05-05 NOTE — Assessment & Plan Note (Signed)
01/06/22 A1c 5.3%, non diabetic

## 2022-05-05 NOTE — Patient Instructions (Signed)
See assessment and plan under each diagnosis in the problem list and acutely for this visit 

## 2022-05-05 NOTE — Progress Notes (Unsigned)
NURSING HOME LOCATION:  [facility] ROOM NUMBER:  111 W  CODE STATUS:  DNR  PCP:  Synthia Innocent NP  This is a nursing facility follow up visit of chronic medical diagnoses & to document compliance with Regulation 483.30 (c) in The Long Term Care Survey Manual Phase 2 which mandates caregiver visit ( visits can alternate among physician, PA or NP as per statutes) within 10 days of 30 days / 60 days/ 90 days post admission to SNF date    Interim medical record and care since last SNF visit was updated with review of diagnostic studies and change in clinical status since last visit were documented.  HPI: He is a permanent resident of the facility with medical diagnoses of aortic atherosclerosis,GERD, essential hypertension, hypothyroidism, dyslipidemia, degenerative joint disease, diabetes with PVD and history of stroke complicated by hemiplegia and hemiparesis.  On 10/01/2021 creatinine was 1.44 and GFR 50; this is improved as of 10/12 with values of 1.29 and 57 indicating CKD high stage IIIa.  LDL is at goal of less than 70 with a value of 49.  Also on 4/7 H/H was 11.8/34.8; these values have improved to 12/35.8.  Current TSH is therapeutic at 1.804.  On 7/13 A1c was nondiabetic at 5.3%.  Today's blood pressure is soft with values of 96/60.  The chart was reviewed and blood pressure range is 90/50 up to 128/74.  He is not on any antihypertensive medications.  Review of systems: He gave the date as "27 or 7th of November 2023."  He states that he is "fine" except for mild extrinsic symptoms suggesting hayfever.  This is manifested as nasal congestion, postnasal drainage, and sneezing. He states that he has been using the exercise equipment and is seeing improvement in the left-sided weakness related to his stroke.  He describes numbness and tingling on the right hand.  He does validate that he "strangles easily."  Constitutional: No fever, significant weight change, fatigue   Eyes: No redness, discharge, pain, vision change ENT/mouth: No nasal congestion,  purulent discharge, earache, change in hearing, sore throat  Cardiovascular: No chest pain, palpitations, paroxysmal nocturnal dyspnea, claudication, edema  Respiratory: No cough, sputum production, hemoptysis, DOE, significant snoring, apnea   Gastrointestinal: No heartburn, dysphagia, abdominal pain, nausea /vomiting, rectal bleeding, melena, change in bowels Genitourinary: No dysuria, hematuria, pyuria, incontinence, nocturia Musculoskeletal: No joint stiffness, joint swelling, weakness, pain Dermatologic: No rash, pruritus, change in appearance of skin Neurologic: No dizziness, headache, syncope, seizures, numbness, tingling Psychiatric: No significant anxiety, depression, insomnia, anorexia Endocrine: No change in hair/skin/nails, excessive thirst, excessive hunger, excessive urination  Hematologic/lymphatic: No significant bruising, lymphadenopathy, abnormal bleeding Allergy/immunology: No itchy/watery eyes, significant sneezing, urticaria, angioedema  Physical exam:  Pertinent or positive findings: He is thin but appears adequately nourished.  Eyebrows are decreased laterally.  Arcus senilis is present.  There is slight increase in the second heart sound.  Pedal pulses are decreased.  There are dramatic arthritic changes of the hands.  The DIP joint of the fourth right and third left DIP joint is unstable with passive hyperextension of this joint.  Limbs are atrophic.  Right upper and right lower extremities are slightly stronger to opposition than the left extremities.  Tinel's sign is negative at the right wrist. General appearance: Adequately nourished; no acute distress, increased work of breathing is present.   Lymphatic: No lymphadenopathy about the head, neck, axilla. Eyes: No conjunctival inflammation or lid edema is present. There is no scleral  icterus. Ears:  External ear exam shows no significant  lesions or deformities.   Nose:  External nasal examination shows no deformity or inflammation. Nasal mucosa are pink and moist without lesions, exudates Oral exam:  Lips and gums are healthy appearing. There is no oropharyngeal erythema or exudate. Neck:  No thyromegaly, masses, tenderness noted.    Heart:  Normal rate and regular rhythm. S1 and S2 normal without gallop, murmur, click, rub .  Lungs: Chest clear to auscultation without wheezes, rhonchi, rales, rubs. Abdomen: Bowel sounds are normal. Abdomen is soft and nontender with no organomegaly, hernias, masses. GU: Deferred  Extremities:  No cyanosis, clubbing, edema  Neurologic exam : Cn 2-7 intact Strength equal  in upper & lower extremities Balance, Rhomberg, finger to nose testing could not be completed due to clinical state Deep tendon reflexes are equal Skin: Warm & dry w/o tenting. No significant lesions or rash.  See summary under each active problem in the Problem List with associated updated therapeutic plan

## 2022-05-06 DIAGNOSIS — I69351 Hemiplegia and hemiparesis following cerebral infarction affecting right dominant side: Secondary | ICD-10-CM | POA: Diagnosis not present

## 2022-05-06 DIAGNOSIS — R279 Unspecified lack of coordination: Secondary | ICD-10-CM | POA: Diagnosis not present

## 2022-05-06 DIAGNOSIS — M6281 Muscle weakness (generalized): Secondary | ICD-10-CM | POA: Diagnosis not present

## 2022-05-06 NOTE — Assessment & Plan Note (Signed)
Pressures are soft; the range varies from a low of 90/50 up to high of 128/74 without hypertensive medication.  Continue to monitor as he is asymptomatic in relationship to this.

## 2022-05-06 NOTE — Assessment & Plan Note (Signed)
Renal function has improved with current creatinine of 1.29 and GFR of 57 indicating high stage IIIa CKD.  Medication list reviewed; no change indicated.

## 2022-05-07 DIAGNOSIS — J302 Other seasonal allergic rhinitis: Secondary | ICD-10-CM | POA: Insufficient documentation

## 2022-05-07 NOTE — Assessment & Plan Note (Signed)
Congestion , PNDr, & sneezing warrant trial of Loratidine

## 2022-05-09 DIAGNOSIS — R279 Unspecified lack of coordination: Secondary | ICD-10-CM | POA: Diagnosis not present

## 2022-05-09 DIAGNOSIS — M6281 Muscle weakness (generalized): Secondary | ICD-10-CM | POA: Diagnosis not present

## 2022-05-09 DIAGNOSIS — I69351 Hemiplegia and hemiparesis following cerebral infarction affecting right dominant side: Secondary | ICD-10-CM | POA: Diagnosis not present

## 2022-05-10 DIAGNOSIS — M6281 Muscle weakness (generalized): Secondary | ICD-10-CM | POA: Diagnosis not present

## 2022-05-10 DIAGNOSIS — I69351 Hemiplegia and hemiparesis following cerebral infarction affecting right dominant side: Secondary | ICD-10-CM | POA: Diagnosis not present

## 2022-05-10 DIAGNOSIS — R279 Unspecified lack of coordination: Secondary | ICD-10-CM | POA: Diagnosis not present

## 2022-05-11 DIAGNOSIS — I69351 Hemiplegia and hemiparesis following cerebral infarction affecting right dominant side: Secondary | ICD-10-CM | POA: Diagnosis not present

## 2022-05-11 DIAGNOSIS — M6281 Muscle weakness (generalized): Secondary | ICD-10-CM | POA: Diagnosis not present

## 2022-05-11 DIAGNOSIS — R279 Unspecified lack of coordination: Secondary | ICD-10-CM | POA: Diagnosis not present

## 2022-05-12 DIAGNOSIS — M6281 Muscle weakness (generalized): Secondary | ICD-10-CM | POA: Diagnosis not present

## 2022-05-12 DIAGNOSIS — I69351 Hemiplegia and hemiparesis following cerebral infarction affecting right dominant side: Secondary | ICD-10-CM | POA: Diagnosis not present

## 2022-05-12 DIAGNOSIS — R279 Unspecified lack of coordination: Secondary | ICD-10-CM | POA: Diagnosis not present

## 2022-05-13 DIAGNOSIS — R279 Unspecified lack of coordination: Secondary | ICD-10-CM | POA: Diagnosis not present

## 2022-05-13 DIAGNOSIS — M6281 Muscle weakness (generalized): Secondary | ICD-10-CM | POA: Diagnosis not present

## 2022-05-13 DIAGNOSIS — I69351 Hemiplegia and hemiparesis following cerebral infarction affecting right dominant side: Secondary | ICD-10-CM | POA: Diagnosis not present

## 2022-05-16 DIAGNOSIS — M6281 Muscle weakness (generalized): Secondary | ICD-10-CM | POA: Diagnosis not present

## 2022-05-16 DIAGNOSIS — R279 Unspecified lack of coordination: Secondary | ICD-10-CM | POA: Diagnosis not present

## 2022-05-16 DIAGNOSIS — I69351 Hemiplegia and hemiparesis following cerebral infarction affecting right dominant side: Secondary | ICD-10-CM | POA: Diagnosis not present

## 2022-05-17 DIAGNOSIS — M6281 Muscle weakness (generalized): Secondary | ICD-10-CM | POA: Diagnosis not present

## 2022-05-17 DIAGNOSIS — R279 Unspecified lack of coordination: Secondary | ICD-10-CM | POA: Diagnosis not present

## 2022-05-17 DIAGNOSIS — I69351 Hemiplegia and hemiparesis following cerebral infarction affecting right dominant side: Secondary | ICD-10-CM | POA: Diagnosis not present

## 2022-05-18 DIAGNOSIS — R279 Unspecified lack of coordination: Secondary | ICD-10-CM | POA: Diagnosis not present

## 2022-05-18 DIAGNOSIS — I69351 Hemiplegia and hemiparesis following cerebral infarction affecting right dominant side: Secondary | ICD-10-CM | POA: Diagnosis not present

## 2022-05-18 DIAGNOSIS — F411 Generalized anxiety disorder: Secondary | ICD-10-CM | POA: Diagnosis not present

## 2022-05-18 DIAGNOSIS — M6281 Muscle weakness (generalized): Secondary | ICD-10-CM | POA: Diagnosis not present

## 2022-05-20 DIAGNOSIS — R279 Unspecified lack of coordination: Secondary | ICD-10-CM | POA: Diagnosis not present

## 2022-05-20 DIAGNOSIS — M6281 Muscle weakness (generalized): Secondary | ICD-10-CM | POA: Diagnosis not present

## 2022-05-20 DIAGNOSIS — I69351 Hemiplegia and hemiparesis following cerebral infarction affecting right dominant side: Secondary | ICD-10-CM | POA: Diagnosis not present

## 2022-05-21 DIAGNOSIS — M6281 Muscle weakness (generalized): Secondary | ICD-10-CM | POA: Diagnosis not present

## 2022-05-21 DIAGNOSIS — I69351 Hemiplegia and hemiparesis following cerebral infarction affecting right dominant side: Secondary | ICD-10-CM | POA: Diagnosis not present

## 2022-05-21 DIAGNOSIS — R279 Unspecified lack of coordination: Secondary | ICD-10-CM | POA: Diagnosis not present

## 2022-05-23 DIAGNOSIS — I69351 Hemiplegia and hemiparesis following cerebral infarction affecting right dominant side: Secondary | ICD-10-CM | POA: Diagnosis not present

## 2022-05-23 DIAGNOSIS — R279 Unspecified lack of coordination: Secondary | ICD-10-CM | POA: Diagnosis not present

## 2022-05-23 DIAGNOSIS — M6281 Muscle weakness (generalized): Secondary | ICD-10-CM | POA: Diagnosis not present

## 2022-05-24 DIAGNOSIS — M6281 Muscle weakness (generalized): Secondary | ICD-10-CM | POA: Diagnosis not present

## 2022-05-24 DIAGNOSIS — R279 Unspecified lack of coordination: Secondary | ICD-10-CM | POA: Diagnosis not present

## 2022-05-24 DIAGNOSIS — I69351 Hemiplegia and hemiparesis following cerebral infarction affecting right dominant side: Secondary | ICD-10-CM | POA: Diagnosis not present

## 2022-05-25 DIAGNOSIS — M6281 Muscle weakness (generalized): Secondary | ICD-10-CM | POA: Diagnosis not present

## 2022-05-25 DIAGNOSIS — R279 Unspecified lack of coordination: Secondary | ICD-10-CM | POA: Diagnosis not present

## 2022-05-25 DIAGNOSIS — I69351 Hemiplegia and hemiparesis following cerebral infarction affecting right dominant side: Secondary | ICD-10-CM | POA: Diagnosis not present

## 2022-05-26 DIAGNOSIS — M6281 Muscle weakness (generalized): Secondary | ICD-10-CM | POA: Diagnosis not present

## 2022-05-26 DIAGNOSIS — R279 Unspecified lack of coordination: Secondary | ICD-10-CM | POA: Diagnosis not present

## 2022-05-26 DIAGNOSIS — I69351 Hemiplegia and hemiparesis following cerebral infarction affecting right dominant side: Secondary | ICD-10-CM | POA: Diagnosis not present

## 2022-06-01 ENCOUNTER — Non-Acute Institutional Stay (SKILLED_NURSING_FACILITY): Payer: Medicare Other | Admitting: Adult Health

## 2022-06-01 ENCOUNTER — Encounter: Payer: Self-pay | Admitting: Adult Health

## 2022-06-01 DIAGNOSIS — E1149 Type 2 diabetes mellitus with other diabetic neurological complication: Secondary | ICD-10-CM | POA: Diagnosis not present

## 2022-06-01 DIAGNOSIS — E785 Hyperlipidemia, unspecified: Secondary | ICD-10-CM | POA: Diagnosis not present

## 2022-06-01 DIAGNOSIS — E441 Mild protein-calorie malnutrition: Secondary | ICD-10-CM | POA: Diagnosis not present

## 2022-06-01 DIAGNOSIS — E1122 Type 2 diabetes mellitus with diabetic chronic kidney disease: Secondary | ICD-10-CM

## 2022-06-01 DIAGNOSIS — N183 Chronic kidney disease, stage 3 unspecified: Secondary | ICD-10-CM | POA: Diagnosis not present

## 2022-06-01 DIAGNOSIS — E1169 Type 2 diabetes mellitus with other specified complication: Secondary | ICD-10-CM

## 2022-06-01 NOTE — Progress Notes (Unsigned)
Location:  [facility] Room Number: [phone] Place of Service:  SNF (31)   CODE STATUS: DNR  No Known Allergies  Chief Complaint  Patient presents with   Medical Management of Chronic Issues    Medical Management of Chronic Issues.     HPI:    Past Medical History:  Diagnosis Date   Hypertension    Benign Essential   Hypothyroidism    Obesity    Osteoarthritis    PVD (peripheral vascular disease) (HCC)    Type 2 diabetes mellitus with vascular complications     Past Surgical History:  Procedure Laterality Date   APPENDECTOMY     TONSILLECTOMY      Social History   Socioeconomic History   Marital status: Single    Spouse name: Not on file   Number of children: Not on file   Years of education: 12   Highest education level: Not on file  Occupational History   Occupation: RETIRED  Tobacco Use   Smoking status: Former   Smokeless tobacco: Never  Building services engineer Use: Never used  Substance and Sexual Activity   Alcohol use: Not Currently   Drug use: Never   Sexual activity: Not Currently  Other Topics Concern   Not on file  Social History Narrative   Not on file   Social Determinants of Health   Financial Resource Strain: Not on file  Food Insecurity: Not on file  Transportation Needs: Not on file  Physical Activity: Not on file  Stress: Not on file  Social Connections: Not on file  Intimate Partner Violence: Not on file   Family History  Problem Relation Age of Onset   Osteoporosis Mother    Hypertension Mother    Cancer Mother    Heart attack Father    Heart attack Maternal Grandmother    Pneumonia Maternal Grandfather    Heart disease Paternal Grandmother    Heart disease Paternal Grandfather       VITAL SIGNS BP 115/61   Pulse 66   Temp 98.5 F (36.9 C)   Resp 18   Ht 5\' 11"  (1.803 m)   Wt 151 lb 6.4 oz (68.7 kg)   SpO2 90%   BMI 21.12 kg/m   Outpatient Encounter Medications as of 06/01/2022   Medication Sig   alendronate (FOSAMAX) 70 MG tablet Take 70 mg by mouth once a week. Take with a full glass of water on an empty stomach on Thursday.   aspirin EC 81 MG tablet Take 162 mg by mouth daily. Swallow whole.   atorvastatin (LIPITOR) 40 MG tablet Take 40 mg by mouth daily.   calcium carbonate (OS-CAL) 600 MG TABS tablet Take 600 mg by mouth daily.   Cholecalciferol (VITAMIN D) 50 MCG (2000 UT) tablet Take 2,000 Units by mouth daily.   levothyroxine (SYNTHROID) 75 MCG tablet Take 75 mcg by mouth daily before breakfast.   memantine (NAMENDA XR) 28 MG CP24 24 hr capsule Take 28 mg by mouth daily. 9 am   NON FORMULARY Diet:Regular Diet with Thin Liquids   omeprazole (PRILOSEC OTC) 20 MG tablet Take 20 mg by mouth at bedtime.   senna-docusate (SENOKOT-S) 8.6-50 MG tablet Take 2 tablets by mouth 2 (two) times daily as needed. hold for diarrhea   sertraline (ZOLOFT) 50 MG tablet Take 50 mg by mouth daily.   triamcinolone cream (KENALOG) 0.1 % Apply 1 application  topically 2 (two) times daily as needed. Apply to  all areas of psoriasis   No facility-administered encounter medications on file as of 06/01/2022.     SIGNIFICANT DIAGNOSTIC EXAMS       ASSESSMENT/ PLAN:     Ok Edwards NP [facility]  Contact [phone] Monday through Friday 8am- 5pm  After hours call [phone]

## 2022-06-02 ENCOUNTER — Non-Acute Institutional Stay (SKILLED_NURSING_FACILITY): Payer: Medicare Other | Admitting: Adult Health

## 2022-06-02 ENCOUNTER — Encounter: Payer: Self-pay | Admitting: Adult Health

## 2022-06-02 DIAGNOSIS — I152 Hypertension secondary to endocrine disorders: Secondary | ICD-10-CM | POA: Diagnosis not present

## 2022-06-02 DIAGNOSIS — E1159 Type 2 diabetes mellitus with other circulatory complications: Secondary | ICD-10-CM | POA: Diagnosis not present

## 2022-06-02 DIAGNOSIS — N183 Chronic kidney disease, stage 3 unspecified: Secondary | ICD-10-CM | POA: Diagnosis not present

## 2022-06-02 DIAGNOSIS — E1149 Type 2 diabetes mellitus with other diabetic neurological complication: Secondary | ICD-10-CM

## 2022-06-02 DIAGNOSIS — E1122 Type 2 diabetes mellitus with diabetic chronic kidney disease: Secondary | ICD-10-CM | POA: Diagnosis not present

## 2022-06-02 NOTE — Progress Notes (Signed)
Location:  [facility] Room Number: 111 Place of Service:  SNF (31)   CODE STATUS: dnr   No Known Allergies  Chief Complaint  Patient presents with   Acute Visit    Care plan meeting     HPI:  We have come together for his care plan meeting. Family present  BIMS 15/15 mood 0/30. He requires max assist with transfers without falls present. He requires max assist with his adls. He is incontinent of bladder and bowel. Dietary:  regular diet 75-100 % of meals; weight is 150.8 pounds; has gained 5-6 pounds in the past quarter; feeds self. Therapy: none at this time. Activities: occasionally attends. He continues to be followed for his chronic illnesses including:  Hypertension associated with type 2 diabetes mellitus  Type 2 diabetes mellitus with neurological complications CKD stage 3 due to type 2 diabetes mellitus  Past Medical History:  Diagnosis Date   Hypertension    Benign Essential   Hypothyroidism    Obesity    Osteoarthritis    PVD (peripheral vascular disease) (HCC)    Type 2 diabetes mellitus with vascular complications     Past Surgical History:  Procedure Laterality Date   APPENDECTOMY     TONSILLECTOMY      Social History   Socioeconomic History   Marital status: Single    Spouse name: Not on file   Number of children: Not on file   Years of education: 12   Highest education level: Not on file  Occupational History   Occupation: RETIRED  Tobacco Use   Smoking status: Former   Smokeless tobacco: Never  Scientific laboratory technician Use: Never used  Substance and Sexual Activity   Alcohol use: Not Currently   Drug use: Never   Sexual activity: Not Currently  Other Topics Concern   Not on file  Social History Narrative   Not on file   Social Determinants of Health   Financial Resource Strain: Not on file  Food Insecurity: Not on file  Transportation Needs: Not on file  Physical Activity: Not on file  Stress: Not on file  Social  Connections: Not on file  Intimate Partner Violence: Not on file   Family History  Problem Relation Age of Onset   Osteoporosis Mother    Hypertension Mother    Cancer Mother    Heart attack Father    Heart attack Maternal Grandmother    Pneumonia Maternal Grandfather    Heart disease Paternal Grandmother    Heart disease Paternal Grandfather       VITAL SIGNS BP 114/64   Pulse 66   Temp 98.5 F (36.9 C)   Resp 18   Ht 5\' 11"  (1.803 m)   Wt 150 lb 12.8 oz (68.4 kg)   SpO2 90%   BMI 21.03 kg/m   Outpatient Encounter Medications as of 06/02/2022  Medication Sig   alendronate (FOSAMAX) 70 MG tablet Take 70 mg by mouth once a week. Take with a full glass of water on an empty stomach on Thursday.   aspirin EC 81 MG tablet Take 162 mg by mouth daily. Swallow whole.   atorvastatin (LIPITOR) 40 MG tablet Take 40 mg by mouth daily.   calcium carbonate (OS-CAL) 600 MG TABS tablet Take 600 mg by mouth daily.   Cholecalciferol (VITAMIN D) 50 MCG (2000 UT) tablet Take 2,000 Units by mouth daily.   levothyroxine (SYNTHROID) 75 MCG tablet Take 75 mcg by mouth  daily before breakfast.   memantine (NAMENDA XR) 28 MG CP24 24 hr capsule Take 28 mg by mouth daily. 9 am   NON FORMULARY Diet:Regular Diet with Thin Liquids   omeprazole (PRILOSEC OTC) 20 MG tablet Take 20 mg by mouth at bedtime.   senna-docusate (SENOKOT-S) 8.6-50 MG tablet Take 2 tablets by mouth 2 (two) times daily as needed. hold for diarrhea   sertraline (ZOLOFT) 50 MG tablet Take 50 mg by mouth daily.   triamcinolone cream (KENALOG) 0.1 % Apply 1 application  topically 2 (two) times daily as needed. Apply to all areas of psoriasis   No facility-administered encounter medications on file as of 06/02/2022.     SIGNIFICANT DIAGNOSTIC EXAMS  PREVIOUS   10-01-21: chest x-ray: no infiltrate; no effusion; no acute findings.    02-09-22: dex scan: t score -2.863  NO NEW EXAMS.    LABS REVIEWED: PREVIOUS   08-12-21: hgb a1c  5.4  10-01-21: wbc 9.3; hgb 11.8; hct 34.8; mcv 95.3 plt 185; glucose 107; bun 31; creat 1.44; k+ 4.5; na++ 138; ca 8.6; GFR 50; d-dimer 1.44 CRP 0.8   11-04-21 tsh 1.660 01-04-22: hgb a1c 5.3  03-17-22: tsh 1.804  NO NEW LABS.   Review of Systems  Constitutional:  Negative for malaise/fatigue.  Respiratory:  Negative for cough and shortness of breath.   Cardiovascular:  Negative for chest pain, palpitations and leg swelling.  Gastrointestinal:  Negative for abdominal pain, constipation and heartburn.  Musculoskeletal:  Negative for back pain, joint pain and myalgias.  Skin: Negative.   Neurological:  Negative for dizziness.  Psychiatric/Behavioral:  The patient is not nervous/anxious.    Physical Exam Constitutional:      General: He is not in acute distress.    Appearance: He is well-developed. He is not diaphoretic.  Neck:     Thyroid: No thyromegaly.  Cardiovascular:     Rate and Rhythm: Normal rate and regular rhythm.     Pulses: Normal pulses.     Heart sounds: Normal heart sounds.  Pulmonary:     Effort: Pulmonary effort is normal. No respiratory distress.     Breath sounds: Normal breath sounds.  Abdominal:     General: Bowel sounds are normal. There is no distension.     Palpations: Abdomen is soft.     Tenderness: There is no abdominal tenderness.  Musculoskeletal:        General: Normal range of motion.     Cervical back: Neck supple.     Right lower leg: No edema.     Left lower leg: No edema.  Lymphadenopathy:     Cervical: No cervical adenopathy.  Skin:    General: Skin is warm and dry.  Neurological:     Mental Status: He is alert. Mental status is at baseline.  Psychiatric:        Mood and Affect: Mood normal.      ASSESSMENT/ PLAN:  TODAY  Hypertension associated with type 2 diabetes mellitus Type 2 diabetes mellitus with neurological complications CKD stage 3 due to type 2 diabetes mellitus  Will continue current medications Will continue  current plan of care Will continue to monitor his status.   Time spent with patient: 40 minutes: medications; dietary; health status.    Synthia Innocent NP [facility]  call [phone]

## 2022-06-30 ENCOUNTER — Non-Acute Institutional Stay (SKILLED_NURSING_FACILITY): Payer: Medicare Other | Admitting: Adult Health

## 2022-06-30 ENCOUNTER — Encounter: Payer: Self-pay | Admitting: Adult Health

## 2022-06-30 DIAGNOSIS — K219 Gastro-esophageal reflux disease without esophagitis: Secondary | ICD-10-CM

## 2022-06-30 DIAGNOSIS — E1159 Type 2 diabetes mellitus with other circulatory complications: Secondary | ICD-10-CM

## 2022-06-30 DIAGNOSIS — K5909 Other constipation: Secondary | ICD-10-CM | POA: Diagnosis not present

## 2022-06-30 DIAGNOSIS — M810 Age-related osteoporosis without current pathological fracture: Secondary | ICD-10-CM

## 2022-06-30 DIAGNOSIS — I152 Hypertension secondary to endocrine disorders: Secondary | ICD-10-CM

## 2022-06-30 NOTE — Progress Notes (Signed)
Location:  [facility] Room Number: 111-W Place of Service:  SNF (31) Provider: Synthia Innocent, NP  CODE STATUS: DNR  No Known Allergies  Chief Complaint  Patient presents with   Medical Management of Chronic Issues                     Osteoporosis Hypertension associated with type 2 diabetes mellitus: Chronic constipation: GERD without esophagitis    HPI:  He is a 77 year old long term resident of this facility being seen for the management of his chronic illnesses: Osteoporosis Hypertension associated with type 2 diabetes mellitus: Chronic constipation: GERD without esophagitis.  There are no reports of uncontrolled pain. There are no reports of constipation or heart burn. His weight is stable.   Past Medical History:  Diagnosis Date   Hypertension    Benign Essential   Hypothyroidism    Obesity    Osteoarthritis    PVD (peripheral vascular disease) (HCC)    Type 2 diabetes mellitus with vascular complications     Past Surgical History:  Procedure Laterality Date   APPENDECTOMY     TONSILLECTOMY      Social History   Socioeconomic History   Marital status: Single    Spouse name: Not on file   Number of children: Not on file   Years of education: 12   Highest education level: Not on file  Occupational History   Occupation: RETIRED  Tobacco Use   Smoking status: Former   Smokeless tobacco: Never  Building services engineer Use: Never used  Substance and Sexual Activity   Alcohol use: Not Currently   Drug use: Never   Sexual activity: Not Currently  Other Topics Concern   Not on file  Social History Narrative   Not on file   Social Determinants of Health   Financial Resource Strain: Not on file  Food Insecurity: Not on file  Transportation Needs: Not on file  Physical Activity: Not on file  Stress: Not on file  Social Connections: Not on file  Intimate Partner Violence: Not on file   Family History  Problem Relation Age of Onset    Osteoporosis Mother    Hypertension Mother    Cancer Mother    Heart attack Father    Heart attack Maternal Grandmother    Pneumonia Maternal Grandfather    Heart disease Paternal Grandmother    Heart disease Paternal Grandfather       VITAL SIGNS BP (!) 96/46   Pulse 76   Temp (!) 96.9 F (36.1 C)   Resp 20   Ht 5\' 11"  (1.803 m)   Wt 149 lb 12.8 oz (67.9 kg)   SpO2 100%   BMI 20.89 kg/m   Outpatient Encounter Medications as of 06/30/2022  Medication Sig   alendronate (FOSAMAX) 70 MG tablet Take 70 mg by mouth once a week. Take with a full glass of water on an empty stomach on Thursday.   aspirin EC 81 MG tablet Take 162 mg by mouth daily. Swallow whole.   atorvastatin (LIPITOR) 40 MG tablet Take 40 mg by mouth daily.   calcium carbonate (OS-CAL) 600 MG TABS tablet Take 600 mg by mouth daily.   Cholecalciferol (VITAMIN D) 50 MCG (2000 UT) tablet Take 2,000 Units by mouth daily.   levothyroxine (SYNTHROID) 75 MCG tablet Take 75 mcg by mouth daily before breakfast.   memantine (NAMENDA XR) 28 MG CP24 24 hr capsule Take 28  mg by mouth daily. 9 am   NON FORMULARY Diet:Regular Diet with Thin Liquids   omeprazole (PRILOSEC OTC) 20 MG tablet Take 20 mg by mouth at bedtime.   senna-docusate (SENOKOT-S) 8.6-50 MG tablet Take 2 tablets by mouth 2 (two) times daily as needed. hold for diarrhea   sertraline (ZOLOFT) 50 MG tablet Take 50 mg by mouth daily.   triamcinolone cream (KENALOG) 0.1 % Apply 1 application  topically 2 (two) times daily as needed. Apply to all areas of psoriasis   No facility-administered encounter medications on file as of 06/30/2022.     SIGNIFICANT DIAGNOSTIC EXAMS  PREVIOUS   10-01-21: chest x-ray: no infiltrate; no effusion; no acute findings.    02-09-22: dex scan: t score -2.863  NO NEW EXAMS.    LABS REVIEWED: PREVIOUS   08-12-21: hgb a1c 5.4  10-01-21: wbc 9.3; hgb 11.8; hct 34.8; mcv 95.3 plt 185; glucose 107; bun 31; creat 1.44; k+ 4.5; na++ 138;  ca 8.6; GFR 50; d-dimer 1.44 CRP 0.8   11-04-21 tsh 1.660 01-04-22: hgb a1c 5.3  03-17-22: tsh 1.804 04-07-22: wbc 6.6; hgb 12.0; hct 35.8; mcv 96.2plt 201; glucose 92; bun 31; creat 1.29; k+ 4.3; na++ 140; ca 8.4; gfr 57; protein 5.6; albumin 2.8; chol 98; ldl 49; trig 67; hdl 36; urine micro-albumin <3.0  NO NEW LABS.   Review of Systems  Constitutional:  Negative for malaise/fatigue.  Respiratory:  Negative for cough and shortness of breath.   Cardiovascular:  Negative for chest pain, palpitations and leg swelling.  Gastrointestinal:  Negative for abdominal pain, constipation and heartburn.  Musculoskeletal:  Negative for back pain, joint pain and myalgias.  Skin: Negative.   Neurological:  Negative for dizziness.  Psychiatric/Behavioral:  The patient is not nervous/anxious.    Physical Exam Constitutional:      General: He is not in acute distress.    Appearance: He is well-developed. He is not diaphoretic.  Neck:     Thyroid: No thyromegaly.  Cardiovascular:     Rate and Rhythm: Normal rate and regular rhythm.     Pulses: Normal pulses.     Heart sounds: Normal heart sounds.  Pulmonary:     Effort: Pulmonary effort is normal. No respiratory distress.     Breath sounds: Normal breath sounds.  Abdominal:     General: Bowel sounds are normal. There is no distension.     Palpations: Abdomen is soft.     Tenderness: There is no abdominal tenderness.  Musculoskeletal:        General: Normal range of motion.     Cervical back: Neck supple.     Right lower leg: No edema.     Left lower leg: No edema.  Lymphadenopathy:     Cervical: No cervical adenopathy.  Skin:    General: Skin is warm and dry.  Neurological:     Mental Status: He is alert. Mental status is at baseline.  Psychiatric:        Mood and Affect: Mood normal.             ASSESSMENT/ PLAN:  TODAY  Osteoporosis t score -2.863 will continue fosamax 70 mg weekly   2. Hypertension associated with type 2  diabetes mellitus: b/p 96/46 is not on medications  3. Chronic constipation: will continue senna s 2 tabs twice daily as needed   4. GERD without esophagitis: will continue prilosec 20 mg daily    PREVIOUS   5. History of CVA will  continue asa 162 mg daily is off plavix  6. Thyroid disease: tsh 1.804; will continue synthroid 75 mcg daily   7. Hemiplegia and hemiparesis following cerebral infarction unspecified site: is on asa 162 mg daily   8. Vascular dementia without behavioral disturbance, psychotic disturbance mood disturbance or anxiety: weight is 149 pounds; will continue namenda xr 28 mg daily   9. CKD stage 3 associated with type 2 diabetes mellitus: bun 31; creat 1.29; gfr 57  10. Mild protein calorie malnutrition: albumin 2.8 will continue supplements as directed  11. Hyperlipidemia associated with type 2 diabetes mellitus: ldl 49; will continue lipitor 40 mg daily   12. Type 2 diabetes mellitus with neurological manifestations: hgb A1c 5.3; is off [place].      Ok Edwards NP [facility]  call [phone]

## 2022-08-09 ENCOUNTER — Encounter: Payer: Self-pay | Admitting: Adult Health

## 2022-08-09 ENCOUNTER — Non-Acute Institutional Stay (SKILLED_NURSING_FACILITY): Payer: Medicare Other | Admitting: Adult Health

## 2022-08-09 DIAGNOSIS — Z0000 Encounter for general adult medical examination without abnormal findings: Secondary | ICD-10-CM

## 2022-08-09 NOTE — Progress Notes (Signed)
Location:  [facility] Room Number: 111-W Place of Service:  SNF (31)   CODE STATUS: DNR  No Known Allergies  Chief Complaint  Patient presents with   Medicare Wellness    AWV    HPI:    Past Medical History:  Diagnosis Date   Hypertension    Benign Essential   Hypothyroidism    Obesity    Osteoarthritis    PVD (peripheral vascular disease) ([place])    Type 2 diabetes mellitus with vascular complications     Past Surgical History:  Procedure Laterality Date   APPENDECTOMY     TONSILLECTOMY      Social History   Socioeconomic History   Marital status: Single    Spouse name: Not on file   Number of children: Not on file   Years of education: 12   Highest education level: Not on file  Occupational History   Occupation: RETIRED  Tobacco Use   Smoking status: Former   Smokeless tobacco: Never  Scientific laboratory technician Use: Never used  Substance and Sexual Activity   Alcohol use: Not Currently   Drug use: Never   Sexual activity: Not Currently  Other Topics Concern   Not on file  Social History Narrative   Not on file   Social Determinants of Health   Financial Resource Strain: Not on file  Food Insecurity: Not on file  Transportation Needs: Not on file  Physical Activity: Not on file  Stress: Not on file  Social Connections: Not on file  Intimate Partner Violence: Not on file   Family History  Problem Relation Age of Onset   Osteoporosis Mother    Hypertension Mother    Cancer Mother    Heart attack Father    Heart attack Maternal Grandmother    Pneumonia Maternal Grandfather    Heart disease Paternal Grandmother    Heart disease Paternal Grandfather       VITAL SIGNS BP 109/66   Pulse (!) 44   Temp (!) 97.5 F (36.4 C)   Resp 20   Ht 5' 11"$  (1.803 m)   Wt 157 lb (71.2 kg)   SpO2 97%   BMI 21.90 kg/m   Outpatient Encounter Medications as of 08/09/2022  Medication Sig   acetaminophen (TYLENOL) 325 MG tablet Take  650 mg by mouth every 6 (six) hours as needed.   alendronate (FOSAMAX) 70 MG tablet Take 70 mg by mouth once a week. Take with a full glass of water on an empty stomach on Thursday.   aspirin EC 81 MG tablet Take 162 mg by mouth daily. Swallow whole.   atorvastatin (LIPITOR) 40 MG tablet Take 40 mg by mouth daily.   calcium carbonate (OS-CAL) 600 MG TABS tablet Take 600 mg by mouth daily.   Cholecalciferol (VITAMIN D) 50 MCG (2000 UT) tablet Take 2,000 Units by mouth daily.   levothyroxine (SYNTHROID) 75 MCG tablet Take 75 mcg by mouth daily before breakfast.   memantine (NAMENDA XR) 28 MG CP24 24 hr capsule Take 28 mg by mouth daily. 9 am   NON FORMULARY Diet:Regular Diet with Thin Liquids   omeprazole (PRILOSEC OTC) 20 MG tablet Take 20 mg by mouth at bedtime.   senna-docusate (SENOKOT-S) 8.6-50 MG tablet Take 2 tablets by mouth 2 (two) times daily as needed. hold for diarrhea   sertraline (ZOLOFT) 50 MG tablet Take 50 mg by mouth daily.   Throat Lozenges (LOZENGES MT) Use as directed in  the mouth or throat.  1; oral,Every Shift Special Instructions: sore throat   triamcinolone cream (KENALOG) 0.1 % Apply 1 application  topically 2 (two) times daily as needed. Apply to all areas of psoriasis   No facility-administered encounter medications on file as of 08/09/2022.     SIGNIFICANT DIAGNOSTIC EXAMS       ASSESSMENT/ PLAN:     Ok Edwards NP [facility]  Contact [phone] Monday through Friday 8am- 5pm  After hours call [phone]

## 2022-08-09 NOTE — Progress Notes (Signed)
Subjective:   Jason Cox is a 77 y.o. male who presents for Medicare Annual/Subsequent preventive examination.  Review of Systems    Review of Systems  Constitutional:  Negative for malaise/fatigue.  Respiratory:  Negative for cough and shortness of breath.   Cardiovascular:  Negative for chest pain, palpitations and leg swelling.  Gastrointestinal:  Negative for abdominal pain, constipation and heartburn.  Musculoskeletal:  Negative for back pain, joint pain and myalgias.  Skin: Negative.   Neurological:  Negative for dizziness.  Psychiatric/Behavioral:  The patient is not nervous/anxious.     Cardiac Risk Factors include: advanced age (>11mn, >>75women);diabetes mellitus;hypertension;sedentary lifestyle     Objective:    Today's Vitals   08/09/22 1059  BP: 109/66  Pulse: 62  Resp: 20  Temp: (!) 97.5 F (36.4 C)  SpO2: 97%  Weight: 157 lb (71.2 kg)  Height: 5' 11"$  (1.803 m)   Body mass index is 21.9 kg/m.     08/09/2022   11:03 AM 06/30/2022    9:35 AM 06/01/2022    8:42 AM 04/06/2022    9:12 AM 03/10/2022    9:18 AM 01/03/2022    9:24 AM 12/03/2021    9:49 AM  Advanced Directives  Does Patient Have a Medical Advance Directive? Yes Yes Yes Yes Yes Yes Yes  Type of Advance Directive Out of facility DNR (pink MOST or yellow form) Out of facility DNR (pink MOST or yellow form) Out of facility DNR (pink MOST or yellow form) Out of facility DNR (pink MOST or yellow form) Out of facility DNR (pink MOST or yellow form) Out of facility DNR (pink MOST or yellow form) Out of facility DNR (pink MOST or yellow form)  Does patient want to make changes to medical advance directive? No - Patient declined No - Patient declined No - Patient declined No - Patient declined No - Patient declined No - Patient declined No - Patient declined  Pre-existing out of facility DNR order (yellow form or pink MOST form) Yellow form placed in chart (order not valid for inpatient use)     Yellow  form placed in chart (order not valid for inpatient use) Yellow form placed in chart (order not valid for inpatient use)    Current Medications (verified) Outpatient Encounter Medications as of 08/09/2022  Medication Sig   acetaminophen (TYLENOL) 325 MG tablet Take 650 mg by mouth every 6 (six) hours as needed.   alendronate (FOSAMAX) 70 MG tablet Take 70 mg by mouth once a week. Take with a full glass of water on an empty stomach on Thursday.   aspirin EC 81 MG tablet Take 162 mg by mouth daily. Swallow whole.   atorvastatin (LIPITOR) 40 MG tablet Take 40 mg by mouth daily.   calcium carbonate (OS-CAL) 600 MG TABS tablet Take 600 mg by mouth daily.   Cholecalciferol (VITAMIN D) 50 MCG (2000 UT) tablet Take 2,000 Units by mouth daily.   levothyroxine (SYNTHROID) 75 MCG tablet Take 75 mcg by mouth daily before breakfast.   memantine (NAMENDA XR) 28 MG CP24 24 hr capsule Take 28 mg by mouth daily. 9 am   NON FORMULARY Diet:Regular Diet with Thin Liquids   omeprazole (PRILOSEC OTC) 20 MG tablet Take 20 mg by mouth at bedtime.   senna-docusate (SENOKOT-S) 8.6-50 MG tablet Take 2 tablets by mouth 2 (two) times daily as needed. hold for diarrhea   sertraline (ZOLOFT) 50 MG tablet Take 50 mg by mouth daily.   Throat Lozenges (  LOZENGES MT) Use as directed in the mouth or throat.  1; oral,Every Shift Special Instructions: sore throat   triamcinolone cream (KENALOG) 0.1 % Apply 1 application  topically 2 (two) times daily as needed. Apply to all areas of psoriasis   No facility-administered encounter medications on file as of 08/09/2022.    Allergies (verified) Patient has no known allergies.   History: Past Medical History:  Diagnosis Date   Hypertension    Benign Essential   Hypothyroidism    Obesity    Osteoarthritis    PVD (peripheral vascular disease) ([place])    Type 2 diabetes mellitus with vascular complications    Past Surgical History:  Procedure Laterality Date   APPENDECTOMY      TONSILLECTOMY     Family History  Problem Relation Age of Onset   Osteoporosis Mother    Hypertension Mother    Cancer Mother    Heart attack Father    Heart attack Maternal Grandmother    Pneumonia Maternal Grandfather    Heart disease Paternal Grandmother    Heart disease Paternal Grandfather    Social History   Socioeconomic History   Marital status: Single    Spouse name: Not on file   Number of children: Not on file   Years of education: 12   Highest education level: Not on file  Occupational History   Occupation: RETIRED  Tobacco Use   Smoking status: Former   Smokeless tobacco: Never  Scientific laboratory technician Use: Never used  Substance and Sexual Activity   Alcohol use: Not Currently   Drug use: Never   Sexual activity: Not Currently  Other Topics Concern   Not on file  Social History Narrative   Not on file   Social Determinants of Health   Financial Resource Strain: Not on file  Food Insecurity: Not on file  Transportation Needs: Not on file  Physical Activity: Not on file  Stress: Not on file  Social Connections: Not on file    Tobacco Counseling Counseling given: Not Answered   Clinical Intake:  Pre-visit preparation completed: Yes  Pain : No/denies pain     BMI - recorded: 21.9 Nutritional Status: BMI of 19-24  Normal Nutritional Risks: Unintentional weight loss Diabetes: Yes CBG done?: Yes CBG resulted in Enter/ Edit results?: Yes Did pt. bring in CBG monitor from home?: No  How often do you need to have someone help you when you read instructions, pamphlets, or other written materials from your doctor or pharmacy?: 5 - Always  Diabetic?yes  Interpreter Needed?: No      Activities of Daily Living    08/09/2022    3:10 PM  In your present state of health, do you have any difficulty performing the following activities:  Hearing? 1  Vision? 0  Difficulty concentrating or making decisions? 1  Walking or climbing stairs? 1   Dressing or bathing? 1  Doing errands, shopping? 1  Preparing Food and eating ? Y  Using the Toilet? Y  In the past six months, have you accidently leaked urine? Y  Do you have problems with loss of bowel control? Y  Managing your Medications? Y  Managing your Finances? Y  Housekeeping or managing your Housekeeping? Y    Patient Care Team: Gerlene Fee, NP as PCP - General (Geriatric Medicine) [employer], MD as Referring Physician (Internal Medicine)  Indicate any recent Medical Services you may have received from other than [facility] providers in the past year (  date may be approximate).     Assessment:   This is a routine wellness examination for Jason Cox.  Hearing/Vision screen No results found.  Dietary issues and exercise activities discussed: Current Exercise Habits: The patient does not participate in regular exercise at present, Exercise limited by: None identified   Goals Addressed             This Visit's Progress    Absence of Fall and Fall-Related Injury   On track    Evidence-based guidance:  Assess fall risk using a validated tool when available. Consider balance and gait impairment, muscle weakness, diminished vision or hearing, environmental hazards, presence of urinary or bowel urgency and/or incontinence.  Communicate fall injury risk to interprofessional healthcare team.  Develop a fall prevention plan with the patient and family.  Promote use of personal vision and auditory aids.  Promote reorientation, appropriate sensory stimulation, and routines to decrease risk of fall when changes in mental status are present.  Assess assistance level required for safe and effective self-care; consider referral for home care.  Encourage physical activity, such as performance of self-care at highest level of ability, strength and balance exercise program, and provision of appropriate assistive devices; refer to rehabilitation therapy.  Refer to community-based  fall prevention program where available.  If fall occurs, determine the cause and revise fall injury prevention plan.  Regularly review medication contribution to fall risk; consider risk related to polypharmacy and age.  Refer to pharmacist for consultation when concerns about medications are revealed.  Balance adequate pain management with potential for oversedation.  Provide guidance related to environmental modifications.  Consider supplementation with Vitamin D.   Notes:      Follow up with Primary Care Provider   On track    General - Client will not be readmitted within 30 days ([facility])   On track      Depression Screen    08/09/2022    3:10 PM 08/10/2021    1:28 PM 08/05/2021   10:26 AM 04/26/2021    1:39 PM  PHQ 2/9 Scores  PHQ - 2 Score 0 0 0 0  PHQ- 9 Score  1      Fall Risk    08/09/2022    3:10 PM 04/06/2022    9:18 AM 08/10/2021    1:28 PM 08/05/2021   10:25 AM 04/26/2021    1:39 PM  Fall Risk   Falls in the past year? 0 1 1 1 1  $ Number falls in past yr: 0 0 0 0 0  Injury with Fall? 0 0 0 0 0  Risk for fall due to : Impaired balance/gait;Impaired mobility History of fall(s);Impaired balance/gait;Impaired mobility History of fall(s);Impaired balance/gait;Impaired mobility History of fall(s);Impaired balance/gait;Impaired mobility History of fall(s);Impaired balance/gait;Impaired mobility  Follow up Falls evaluation completed Falls evaluation completed   Falls evaluation completed    FALL RISK PREVENTION PERTAINING TO THE HOME:  Any stairs in or around the home? Yes  If so, are there any without handrails? No  Home free of loose throw rugs in walkways, pet beds, electrical cords, etc? Yes  Adequate lighting in your home to reduce risk of falls? Yes   ASSISTIVE DEVICES UTILIZED TO PREVENT FALLS:  Life alert? No  Use of a cane, walker or w/c? Yes  Grab bars in the bathroom? Yes  Shower chair or bench in shower? Yes  Elevated toilet seat or a handicapped toilet?  Yes   TIMED UP AND GO:  Was the test  performed? No .    Cognitive Function:    08/09/2022    3:11 PM 08/05/2021   10:27 AM 04/26/2021    1:40 PM  MMSE - Mini Mental State Exam  Not completed: Unable to complete Unable to complete Unable to complete        Immunizations Immunization History  Administered Date(s) Administered   Influenza Inj Mdck Quad Pf 03/31/2021   Influenza-Unspecified 03/29/2022   Janssen (J&J) SARS-COV-2 Vaccination 10/04/2019   Moderna Covid-19 Vaccine Bivalent Booster 28yr & up 04/20/2021   Moderna SARS-COV2 Booster Vaccination 11/16/2021   Moderna Sars-Covid-2 Vaccination 02/10/2021, 04/20/2022   PNEUMOCOCCAL CONJUGATE-20 04/02/2021   Pneumococcal Polysaccharide-23 02/06/2010   Respiratory Syncytial Virus Vaccine,Recomb Aduvanted(Arexvy) 05/30/2022   Tdap 04/28/2021   Unspecified SARS-COV-2 Vaccination 04/20/2022   Zoster Recombinat (Shingrix) 10/22/2021, 01/19/2022    TDAP status: Up to date  Flu Vaccine status: Up to date  Pneumococcal vaccine status: Up to date  Covid-19 vaccine status: Completed vaccines  Qualifies for Shingles Vaccine? Yes   Zostavax completed Yes   Shingrix Completed?: Yes  Screening Tests Health Maintenance  Topic Date Due   OPHTHALMOLOGY EXAM  11/28/2022 (Originally 06/27/1955)   COVID-19 Vaccine (6 - 2023-24 season) 12/14/2022 (Originally 06/15/2022)   HEMOGLOBIN A1C  12/26/2022 (Originally 07/09/2022)   Diabetic kidney evaluation - eGFR measurement  04/08/2023   Diabetic kidney evaluation - Urine ACR  04/08/2023   FOOT EXAM  06/02/2023   Medicare Annual Wellness (AWV)  08/10/2023   DTaP/Tdap/Td (2 - Td or Tdap) 04/29/2031   Pneumonia Vaccine 77 Years old  Completed   INFLUENZA VACCINE  Completed   Hepatitis C Screening  Completed   Zoster Vaccines- Shingrix  Completed   HPV VACCINES  Aged Out    Health Maintenance  There are no preventive care reminders to display for this patient.  Colorectal cancer  screening: No longer required.   Lung Cancer Screening: (Low Dose CT Chest recommended if Age 79-80years, 30 pack-year currently smoking OR have quit w/in 15years.) does not qualify.   Lung Cancer Screening Referral: n/a  Additional Screening:  Hepatitis C Screening: does not qualify; Completed 04-27-21  Vision Screening: Recommended annual ophthalmology exams for early detection of glaucoma and other disorders of the eye. Is the patient up to date with their annual eye exam?  No  Who is the provider or what is the name of the office in which the patient attends annual eye exams?  If pt is not established with a provider, would they like to be referred to a provider to establish care? No .   Dental Screening: Recommended annual dental exams for proper oral hygiene  Community Resource Referral / Chronic Care Management: CRR required this visit?  No   CCM required this visit?  No      Plan:     I have personally reviewed and noted the following in the patient's chart:   Medical and social history Use of alcohol, tobacco or illicit drugs  Current medications and supplements including opioid prescriptions. Patient is not currently taking opioid prescriptions. Functional ability and status Nutritional status Physical activity Advanced directives List of other physicians Hospitalizations, surgeries, and ER visits in previous 12 months Vitals Screenings to include cognitive, depression, and falls Referrals and appointments  In addition, I have reviewed and discussed with patient certain preventive protocols, quality metrics, and best practice recommendations. A written personalized care plan for preventive services as well as general preventive health recommendations were provided to patient.  Gerlene Fee, NP   08/09/2022   Nurse Notes: this exam has been completed by myself at this facility

## 2022-08-19 ENCOUNTER — Encounter: Payer: Self-pay | Admitting: Internal Medicine

## 2022-08-19 ENCOUNTER — Non-Acute Institutional Stay (SKILLED_NURSING_FACILITY): Payer: Medicare Other | Admitting: Internal Medicine

## 2022-08-19 DIAGNOSIS — E1149 Type 2 diabetes mellitus with other diabetic neurological complication: Secondary | ICD-10-CM

## 2022-08-19 DIAGNOSIS — I69359 Hemiplegia and hemiparesis following cerebral infarction affecting unspecified side: Secondary | ICD-10-CM

## 2022-08-19 DIAGNOSIS — N1831 Chronic kidney disease, stage 3a: Secondary | ICD-10-CM | POA: Diagnosis not present

## 2022-08-19 DIAGNOSIS — E441 Mild protein-calorie malnutrition: Secondary | ICD-10-CM | POA: Diagnosis not present

## 2022-08-19 DIAGNOSIS — F01C0 Vascular dementia, severe, without behavioral disturbance, psychotic disturbance, mood disturbance, and anxiety: Secondary | ICD-10-CM

## 2022-08-19 DIAGNOSIS — I69391 Dysphagia following cerebral infarction: Secondary | ICD-10-CM | POA: Diagnosis not present

## 2022-08-19 NOTE — Progress Notes (Signed)
   NURSING HOME LOCATION:  [facility] ROOM NUMBER:  111 W  CODE STATUS: DNR   PCP:  Ok Edwards NP  This is a nursing facility follow up visit of chronic medical diagnoses & to document compliance with Regulation 483.30 (c) in The [place] Manual Phase 2 which mandates caregiver visit ( visits can alternate among physician, PA or NP as per statutes) within 10 days of 30 days / 60 days/ 90 days post admission to SNF date    Interim medical record and care since last SNF visit was updated with review of diagnostic studies and change in clinical status since last visit were documented.  HPI:  Review of systems: Dementia invalidated responses. Date given as   Constitutional: No fever, significant weight change, fatigue  Eyes: No redness, discharge, pain, vision change ENT/mouth: No nasal congestion,  purulent discharge, earache, change in hearing, sore throat  Cardiovascular: No chest pain, palpitations, paroxysmal nocturnal dyspnea, claudication, edema  Respiratory: No cough, sputum production, hemoptysis, DOE, significant snoring, apnea   Gastrointestinal: No heartburn, dysphagia, abdominal pain, nausea /vomiting, rectal bleeding, melena, change in bowels Genitourinary: No dysuria, hematuria, pyuria, incontinence, nocturia Musculoskeletal: No joint stiffness, joint swelling, weakness, pain Dermatologic: No rash, pruritus, change in appearance of skin Neurologic: No dizziness, headache, syncope, seizures, numbness, tingling Psychiatric: No significant anxiety, depression, insomnia, anorexia Endocrine: No change in hair/skin/nails, excessive thirst, excessive hunger, excessive urination  Hematologic/lymphatic: No significant bruising, lymphadenopathy, abnormal bleeding Allergy/immunology: No itchy/watery eyes, significant sneezing, urticaria, angioedema  Physical exam:  Pertinent or positive findings: General appearance: Adequately nourished; no acute  distress, increased work of breathing is present.   Lymphatic: No lymphadenopathy about the head, neck, axilla. Eyes: No conjunctival inflammation or lid edema is present. There is no scleral icterus. Ears:  External ear exam shows no significant lesions or deformities.   Nose:  External nasal examination shows no deformity or inflammation. Nasal mucosa are pink and moist without lesions, exudates Oral exam:  Lips and gums are healthy appearing. There is no oropharyngeal erythema or exudate. Neck:  No thyromegaly, masses, tenderness noted.    Heart:  Normal rate and regular rhythm. S1 and S2 normal without gallop, murmur, click, rub .  Lungs: Chest clear to auscultation without wheezes, rhonchi, rales, rubs. Abdomen: Bowel sounds are normal. Abdomen is soft and nontender with no organomegaly, hernias, masses. GU: Deferred  Extremities:  No cyanosis, clubbing, edema  Neurologic exam : Cn 2-7 intact Strength equal  in upper & lower extremities Balance, Rhomberg, finger to nose testing could not be completed due to clinical state Deep tendon reflexes are equal Skin: Warm & dry w/o tenting. No significant lesions or rash.  See summary under each active problem in the Problem List with associated updated therapeutic plan

## 2022-08-20 DIAGNOSIS — I69391 Dysphagia following cerebral infarction: Secondary | ICD-10-CM | POA: Insufficient documentation

## 2022-08-20 NOTE — Assessment & Plan Note (Signed)
Today he did not seem to be confabulating & responses seemed logical & focused. Consider MMSE update.

## 2022-08-20 NOTE — Patient Instructions (Signed)
See assessment and plan under each diagnosis in the problem list and acutely for this visit 

## 2022-08-20 NOTE — Assessment & Plan Note (Signed)
Nutritionist continues to follow him here at the SNF.  He does complain about the food choices.  He exhibits limb atrophy and interosseous wasting.  Protein/caloric assessment will be updated.

## 2022-08-20 NOTE — Assessment & Plan Note (Addendum)
There is subtle asymmetry in strength to opposition with right upper and lower extremity stronger than left but this is not dramatic.

## 2022-08-20 NOTE — Assessment & Plan Note (Signed)
Stage IIIa CKD was present in October 2023.  Med list reviewed; no nephrotoxic agents identified.  Renal function assessment will be updated.

## 2022-08-20 NOTE — Assessment & Plan Note (Signed)
Speech Therapy evaluation especially as on Alendronate.

## 2022-08-20 NOTE — Assessment & Plan Note (Signed)
A1c was prediabetic in July 2023 with a value of 5.3%.  This will be updated.

## 2022-08-20 NOTE — Assessment & Plan Note (Deleted)
Stage IIIa CKD was present in October 2023.  Med list reviewed; no nephrotoxic agents identified.  Renal function assessment will be updated.

## 2022-08-26 ENCOUNTER — Non-Acute Institutional Stay (SKILLED_NURSING_FACILITY): Payer: Medicare Other | Admitting: Adult Health

## 2022-08-26 ENCOUNTER — Encounter: Payer: Self-pay | Admitting: Adult Health

## 2022-08-26 DIAGNOSIS — I69391 Dysphagia following cerebral infarction: Secondary | ICD-10-CM

## 2022-08-26 DIAGNOSIS — N183 Chronic kidney disease, stage 3 unspecified: Secondary | ICD-10-CM | POA: Diagnosis not present

## 2022-08-26 DIAGNOSIS — I700 Atherosclerosis of aorta: Secondary | ICD-10-CM | POA: Diagnosis not present

## 2022-08-26 DIAGNOSIS — E1122 Type 2 diabetes mellitus with diabetic chronic kidney disease: Secondary | ICD-10-CM | POA: Diagnosis not present

## 2022-08-26 NOTE — Progress Notes (Signed)
Location:  [place] Room Number: [place] of Service:  SNF (31)   CODE STATUS: DNR  No Known Allergies  Chief Complaint  Patient presents with   Acute Visit    Care plan meeting    HPI:  We have come together for his care plan meeting. BIMS 13/15 mood 0/30. He is nonambulatory with no falls. He requires max to dependent assist with adls. He is incontinent of bladder and bowel. Dietary: he requires set up for meals. Weigh is 157 pounds is on regular diet with appetite 75-100%. Therapy: none at this time. Activities: does attend some. He will continue to be followed for his chronic illnesses including:  Aortic atherosclerosis  Dysphagia as late effect of cerebrovascular accident (CVA)  CKD stage 3 due to type 2 diabetes mellitus   Past Medical History:  Diagnosis Date   Hypertension    Benign Essential   Hypothyroidism    Obesity    Osteoarthritis    PVD (peripheral vascular disease) (HCC)    Type 2 diabetes mellitus with vascular complications     Past Surgical History:  Procedure Laterality Date   APPENDECTOMY     TONSILLECTOMY      Social History   Socioeconomic History   Marital status: Single    Spouse name: Not on file   Number of children: Not on file   Years of education: 12   Highest education level: Not on file  Occupational History   Occupation: RETIRED  Tobacco Use   Smoking status: Former   Smokeless tobacco: Never  Scientific laboratory technician Use: Never used  Substance and Sexual Activity   Alcohol use: Not Currently   Drug use: Never   Sexual activity: Not Currently  Other Topics Concern   Not on file  Social History Narrative   Not on file   Social Determinants of Health   Financial Resource Strain: Not on file  Food Insecurity: Not on file  Transportation Needs: Not on file  Physical Activity: Not on file  Stress: Not on file  Social Connections: Not on file  Intimate Partner Violence: Not on file   Family  History  Problem Relation Age of Onset   Osteoporosis Mother    Hypertension Mother    Cancer Mother    Heart attack Father    Heart attack Maternal Grandmother    Pneumonia Maternal Grandfather    Heart disease Paternal Grandmother    Heart disease Paternal Grandfather       VITAL SIGNS BP 102/62   Pulse 71   Temp (!) 97.5 F (36.4 C)   Resp 20   Ht '5\' 11"'$  (1.803 m)   Wt 157 lb (71.2 kg)   SpO2 98%   BMI 21.90 kg/m   Outpatient Encounter Medications as of 08/26/2022  Medication Sig   acetaminophen (TYLENOL) 325 MG tablet Take 650 mg by mouth every 6 (six) hours as needed.   aspirin EC 81 MG tablet Take 162 mg by mouth daily. Swallow whole.   atorvastatin (LIPITOR) 40 MG tablet Take 40 mg by mouth daily.   calcium carbonate (OS-CAL) 600 MG TABS tablet Take 600 mg by mouth daily.   Cholecalciferol (VITAMIN D) 50 MCG (2000 UT) tablet Take 2,000 Units by mouth daily.   levothyroxine (SYNTHROID) 75 MCG tablet Take 75 mcg by mouth daily before breakfast.   memantine (NAMENDA XR) 28 MG CP24 24 hr capsule Take 28 mg by mouth daily. 9 am  NON FORMULARY Diet:Regular Diet with Thin Liquids   omeprazole (PRILOSEC OTC) 20 MG tablet Take 20 mg by mouth at bedtime.   senna-docusate (SENOKOT-S) 8.6-50 MG tablet Take 2 tablets by mouth 2 (two) times daily as needed. hold for diarrhea   sertraline (ZOLOFT) 50 MG tablet Take 50 mg by mouth daily.   Throat Lozenges (LOZENGES MT) Use as directed in the mouth or throat.  1; oral,Every Shift Special Instructions: sore throat   [DISCONTINUED] alendronate (FOSAMAX) 70 MG tablet Take 70 mg by mouth once a week. Take with a full glass of water on an empty stomach on Thursday.   [DISCONTINUED] triamcinolone cream (KENALOG) 0.1 % Apply 1 application  topically 2 (two) times daily as needed. Apply to all areas of psoriasis   No facility-administered encounter medications on file as of 08/26/2022.     SIGNIFICANT DIAGNOSTIC EXAMS   PREVIOUS    10-01-21: chest x-ray: no infiltrate; no effusion; no acute findings.    02-09-22: dex scan: t score -2.863  NO NEW EXAMS.    LABS REVIEWED: PREVIOUS   10-01-21: wbc 9.3; hgb 11.8; hct 34.8; mcv 95.3 plt 185; glucose 107; bun 31; creat 1.44; k+ 4.5; na++ 138; ca 8.6; GFR 50; d-dimer 1.44 CRP 0.8   11-04-21 tsh 1.660 01-04-22: hgb a1c 5.3  03-17-22: tsh 1.804 04-07-22: wbc 6.6; hgb 12.0; hct 35.8; mcv 96.2plt 201; glucose 92; bun 31; creat 1.29; k+ 4.3; na++ 140; ca 8.4; gfr 57; protein 5.6; albumin 2.8; chol 98; ldl 49; trig 67; hdl 36; urine micro-albumin <3.0  NO NEW LABS.   Review of Systems  Constitutional:  Negative for malaise/fatigue.  Respiratory:  Negative for cough and shortness of breath.   Cardiovascular:  Negative for chest pain, palpitations and leg swelling.  Gastrointestinal:  Negative for abdominal pain, constipation and heartburn.  Musculoskeletal:  Negative for back pain, joint pain and myalgias.  Skin: Negative.   Neurological:  Negative for dizziness.  Psychiatric/Behavioral:  The patient is not nervous/anxious.    Physical Exam Constitutional:      General: He is not in acute distress.    Appearance: He is well-developed. He is not diaphoretic.  Neck:     Thyroid: No thyromegaly.  Cardiovascular:     Rate and Rhythm: Normal rate and regular rhythm.     Pulses: Normal pulses.     Heart sounds: Normal heart sounds.  Pulmonary:     Effort: Pulmonary effort is normal. No respiratory distress.     Breath sounds: Normal breath sounds.  Abdominal:     General: Bowel sounds are normal. There is no distension.     Palpations: Abdomen is soft.     Tenderness: There is no abdominal tenderness.  Musculoskeletal:        General: Normal range of motion.     Cervical back: Neck supple.     Right lower leg: No edema.     Left lower leg: No edema.  Lymphadenopathy:     Cervical: No cervical adenopathy.  Skin:    General: Skin is warm and dry.  Neurological:      Mental Status: He is alert. Mental status is at baseline.  Psychiatric:        Mood and Affect: Mood normal.       ASSESSMENT/ PLAN:  TODAY  Aortic atherosclerosis Dysphagia as late effect of cerebrovascular accident (CVA) CKD stage 3 due to type 2 diabetes mellitus   Will continue current medications Will continue current plan  of care Will continue to monitor his status.   Time with patient 40 minutes: medications; dietary; activities.    Ok Edwards NP [facility]   call [phone]

## 2022-09-14 ENCOUNTER — Encounter: Payer: Self-pay | Admitting: Adult Health

## 2022-09-14 ENCOUNTER — Non-Acute Institutional Stay (SKILLED_NURSING_FACILITY): Payer: Medicare Other | Admitting: Adult Health

## 2022-09-14 DIAGNOSIS — E039 Hypothyroidism, unspecified: Secondary | ICD-10-CM

## 2022-09-14 DIAGNOSIS — F01C0 Vascular dementia, severe, without behavioral disturbance, psychotic disturbance, mood disturbance, and anxiety: Secondary | ICD-10-CM | POA: Diagnosis not present

## 2022-09-14 NOTE — Progress Notes (Signed)
Location:  [place] Room Number: [place] of Service:  SNF (31)   CODE STATUS: DNR  No Known Allergies  Chief Complaint  Patient presents with   Medical Management of Chronic Issues                  History of CVA Thyroid disease:Hemiplegia and hemiparesis following cerebral infarction unspecified site:Vascular dementia without behavioral disturbance; psychotic disturbance; mood disturbance or anxiety:     HPI:  He is a 77 year old long term resident of this facility being seen for the management of his chronic illnesses: History of CVA Thyroid disease:Hemiplegia and hemiparesis following cerebral infarction unspecified site:Vascular dementia without behavioral disturbance; psychotic disturbance; mood disturbance or anxiety. There are no reports of uncontrolled pain. Has a good appetite; no reports of anxiety or agitation.   Past Medical History:  Diagnosis Date   Hypertension    Benign Essential   Hypothyroidism    Obesity    Osteoarthritis    PVD (peripheral vascular disease) (HCC)    Type 2 diabetes mellitus with vascular complications     Past Surgical History:  Procedure Laterality Date   APPENDECTOMY     TONSILLECTOMY      Social History   Socioeconomic History   Marital status: Single    Spouse name: Not on file   Number of children: Not on file   Years of education: 12   Highest education level: Not on file  Occupational History   Occupation: RETIRED  Tobacco Use   Smoking status: Former   Smokeless tobacco: Never  Scientific laboratory technician Use: Never used  Substance and Sexual Activity   Alcohol use: Not Currently   Drug use: Never   Sexual activity: Not Currently  Other Topics Concern   Not on file  Social History Narrative   Not on file   Social Determinants of Health   Financial Resource Strain: Not on file  Food Insecurity: Not on file  Transportation Needs: Not on file  Physical Activity: Not on file  Stress: Not  on file  Social Connections: Not on file  Intimate Partner Violence: Not on file   Family History  Problem Relation Age of Onset   Osteoporosis Mother    Hypertension Mother    Cancer Mother    Heart attack Father    Heart attack Maternal Grandmother    Pneumonia Maternal Grandfather    Heart disease Paternal Grandmother    Heart disease Paternal Grandfather       VITAL SIGNS BP (!) 110/56   Pulse 76   Ht 5\' 11"  (1.803 m)   Wt 161 lb (73 kg)   BMI 22.45 kg/m   Outpatient Encounter Medications as of 09/14/2022  Medication Sig   acetaminophen (TYLENOL) 325 MG tablet Take 650 mg by mouth every 6 (six) hours as needed.   aspirin EC 81 MG tablet Take 162 mg by mouth daily. Swallow whole.   atorvastatin (LIPITOR) 40 MG tablet Take 40 mg by mouth daily.   calcium carbonate (OS-CAL) 600 MG TABS tablet Take 600 mg by mouth daily.   Cholecalciferol (VITAMIN D) 50 MCG (2000 UT) tablet Take 2,000 Units by mouth daily.   levothyroxine (SYNTHROID) 75 MCG tablet Take 75 mcg by mouth daily before breakfast.   memantine (NAMENDA XR) 28 MG CP24 24 hr capsule Take 28 mg by mouth daily. 9 am   NON FORMULARY Diet:Regular Diet with Thin Liquids   omeprazole (  PRILOSEC OTC) 20 MG tablet Take 20 mg by mouth at bedtime.   senna-docusate (SENOKOT-S) 8.6-50 MG tablet Take 2 tablets by mouth 2 (two) times daily as needed. hold for diarrhea   sertraline (ZOLOFT) 50 MG tablet Take 50 mg by mouth daily.   Throat Lozenges (LOZENGES MT) Use as directed in the mouth or throat.  1; oral,Every Shift Special Instructions: sore throat   No facility-administered encounter medications on file as of 09/14/2022.     SIGNIFICANT DIAGNOSTIC EXAMS  PREVIOUS   10-01-21: chest x-ray: no infiltrate; no effusion; no acute findings.    02-09-22: dex scan: t score -2.863  NO NEW EXAMS.    LABS REVIEWED: PREVIOUS   10-01-21: wbc 9.3; hgb 11.8; hct 34.8; mcv 95.3 plt 185; glucose 107; bun 31; creat 1.44; k+ 4.5; na++  138; ca 8.6; GFR 50; d-dimer 1.44 CRP 0.8   11-04-21 tsh 1.660 01-04-22: hgb a1c 5.3  03-17-22: tsh 1.804 04-07-22: wbc 6.6; hgb 12.0; hct 35.8; mcv 96.2plt 201; glucose 92; bun 31; creat 1.29; k+ 4.3; na++ 140; ca 8.4; gfr 57; protein 5.6; albumin 2.8; chol 98; ldl 49; trig 67; hdl 36; urine micro-albumin <3.0  NO NEW LABS.    Review of Systems  Constitutional:  Negative for malaise/fatigue.  Respiratory:  Negative for cough and shortness of breath.   Cardiovascular:  Negative for chest pain, palpitations and leg swelling.  Gastrointestinal:  Negative for abdominal pain, constipation and heartburn.  Musculoskeletal:  Negative for back pain, joint pain and myalgias.  Skin: Negative.   Neurological:  Negative for dizziness.  Psychiatric/Behavioral:  The patient is not nervous/anxious.    Physical Exam Constitutional:      General: He is not in acute distress.    Appearance: He is well-developed. He is not diaphoretic.  Neck:     Thyroid: No thyromegaly.  Cardiovascular:     Rate and Rhythm: Normal rate and regular rhythm.     Pulses: Normal pulses.     Heart sounds: Normal heart sounds.  Pulmonary:     Effort: Pulmonary effort is normal. No respiratory distress.     Breath sounds: Normal breath sounds.  Abdominal:     General: Bowel sounds are normal. There is no distension.     Palpations: Abdomen is soft.     Tenderness: There is no abdominal tenderness.  Musculoskeletal:        General: Normal range of motion.     Cervical back: Neck supple.     Right lower leg: No edema.     Left lower leg: No edema.  Lymphadenopathy:     Cervical: No cervical adenopathy.  Skin:    General: Skin is warm and dry.  Neurological:     Mental Status: He is alert. Mental status is at baseline.  Psychiatric:        Mood and Affect: Mood normal.               ASSESSMENT/ PLAN:  TODAY  History of CVA will continue asa 162 mg daily is off plavix  2. Thyroid disease: tsh 1.804 will  continue synthroid 75 mcg daily   3. Hemiplegia and hemiparesis following cerebral infarction unspecified site: is on asa 162 mg daily   4. Vascular dementia without behavioral disturbance; psychotic disturbance; mood disturbance or anxiety: weight is 161 pounds; will continue namenda xr 28 mg daily    PREVIOUS   5. CKD stage 3 associated with type 2 diabetes mellitus: bun 31; creat  1.29; gfr 57  6. Mild protein calorie malnutrition: albumin 2.8 will continue supplements as directed  7. Hyperlipidemia associated with type 2 diabetes mellitus: ldl 49; will continue lipitor 40 mg daily   8. Type 2 diabetes mellitus with neurological manifestations: hgb A1c 5.3; is off [place].   9. Osteoporosis t score -2.863 will continue to monitor   10. Hypertension associated with type 2 diabetes mellitus: b/p 110/56 is not on medications  11. Chronic constipation: will continue senna s 2 tabs twice daily as needed   12. GERD without esophagitis: will continue prilosec 20 mg daily    Ok Edwards NP [facility]  call [phone]

## 2022-10-06 ENCOUNTER — Encounter: Payer: Self-pay | Admitting: Adult Health

## 2022-10-06 ENCOUNTER — Non-Acute Institutional Stay (SKILLED_NURSING_FACILITY): Payer: Medicare Other | Admitting: Adult Health

## 2022-10-06 DIAGNOSIS — I152 Hypertension secondary to endocrine disorders: Secondary | ICD-10-CM

## 2022-10-06 DIAGNOSIS — E785 Hyperlipidemia, unspecified: Secondary | ICD-10-CM

## 2022-10-06 DIAGNOSIS — E1169 Type 2 diabetes mellitus with other specified complication: Secondary | ICD-10-CM | POA: Diagnosis not present

## 2022-10-06 DIAGNOSIS — N183 Chronic kidney disease, stage 3 unspecified: Secondary | ICD-10-CM

## 2022-10-06 DIAGNOSIS — E1149 Type 2 diabetes mellitus with other diabetic neurological complication: Secondary | ICD-10-CM

## 2022-10-06 DIAGNOSIS — E1122 Type 2 diabetes mellitus with diabetic chronic kidney disease: Secondary | ICD-10-CM | POA: Diagnosis not present

## 2022-10-06 DIAGNOSIS — E1159 Type 2 diabetes mellitus with other circulatory complications: Secondary | ICD-10-CM | POA: Diagnosis not present

## 2022-10-06 NOTE — Progress Notes (Signed)
Location:  [facility] Room Number: [address] of Service:  SNF (31)   CODE STATUS: DNR  No Known Allergies  Chief Complaint  Patient presents with   Medical Management of Chronic Issues                 CKD stage 3 associated with type 2 diabetes mellitus:  Mild protein calorie malnutrition: Hyperlipidemia associated with type 2 diabetes mellitus: Type 2 diabetes mellitus with neurological manifestations:     HPI:  He is a 77 year old long term resident of this facility being seen for the management of his chronic illnesses: CKD stage 3 associated with type 2 diabetes mellitus:  Mild protein calorie malnutrition: Hyperlipidemia associated with type 2 diabetes mellitus: Type 2 diabetes mellitus with neurological manifestations. There are no reports of uncontrolled pain. He does get out of bed daily. His weight is stable at this time.   Past Medical History:  Diagnosis Date   Hypertension    Benign Essential   Hypothyroidism    Obesity    Osteoarthritis    PVD (peripheral vascular disease)    Type 2 diabetes mellitus with vascular complications     Past Surgical History:  Procedure Laterality Date   APPENDECTOMY     TONSILLECTOMY      Social History   Socioeconomic History   Marital status: Single    Spouse name: Not on file   Number of children: Not on file   Years of education: 12   Highest education level: Not on file  Occupational History   Occupation: RETIRED  Tobacco Use   Smoking status: Former   Smokeless tobacco: Never  Building services engineerVaping Use   Vaping Use: Never used  Substance and Sexual Activity   Alcohol use: Not Currently   Drug use: Never   Sexual activity: Not Currently  Other Topics Concern   Not on file  Social History Narrative   Not on file   Social Determinants of Health   Financial Resource Strain: Not on file  Food Insecurity: Not on file  Transportation Needs: Not on file  Physical Activity: Not on file  Stress: Not  on file  Social Connections: Not on file  Intimate Partner Violence: Not on file   Family History  Problem Relation Age of Onset   Osteoporosis Mother    Hypertension Mother    Cancer Mother    Heart attack Father    Heart attack Maternal Grandmother    Pneumonia Maternal Grandfather    Heart disease Paternal Grandmother    Heart disease Paternal Grandfather       VITAL SIGNS BP 121/76   Pulse 80   Temp 97.7 F (36.5 C)   Resp 18   Ht 5\' 11"  (1.803 m)   Wt 162 lb 12.8 oz (73.8 kg)   SpO2 98%   BMI 22.71 kg/m   Outpatient Encounter Medications as of 10/06/2022  Medication Sig   acetaminophen (TYLENOL) 325 MG tablet Take 650 mg by mouth every 6 (six) hours as needed.   aspirin EC 81 MG tablet Take 162 mg by mouth daily. Swallow whole.   atorvastatin (LIPITOR) 40 MG tablet Take 40 mg by mouth daily.   calcium-vitamin D (OSCAL WITH D) 500-5 MG-MCG tablet Take 1 tablet by mouth daily.   levothyroxine (SYNTHROID) 75 MCG tablet Take 75 mcg by mouth daily before breakfast.   memantine (NAMENDA XR) 28 MG CP24 24 hr capsule Take 28 mg by mouth  daily. 9 am   NON FORMULARY Diet:Regular Diet with Thin Liquids   omeprazole (PRILOSEC OTC) 20 MG tablet Take 20 mg by mouth at bedtime.   senna-docusate (SENOKOT-S) 8.6-50 MG tablet Take 2 tablets by mouth 2 (two) times daily as needed. hold for diarrhea   sertraline (ZOLOFT) 25 MG tablet Take 25 mg by mouth daily.   Throat Lozenges (LOZENGES MT) Use as directed in the mouth or throat.  1; oral,Every Shift Special Instructions: sore throat   [DISCONTINUED] calcium carbonate (OS-CAL) 600 MG TABS tablet Take 600 mg by mouth daily.   [DISCONTINUED] Cholecalciferol (VITAMIN D) 50 MCG (2000 UT) tablet Take 2,000 Units by mouth daily.   [DISCONTINUED] sertraline (ZOLOFT) 50 MG tablet Take 50 mg by mouth daily.   No facility-administered encounter medications on file as of 10/06/2022.     SIGNIFICANT DIAGNOSTIC EXAMS  PREVIOUS   10-01-21:  chest x-ray: no infiltrate; no effusion; no acute findings.    02-09-22: dex scan: t score -2.863  NO NEW EXAMS.    LABS REVIEWED: PREVIOUS   10-01-21: wbc 9.3; hgb 11.8; hct 34.8; mcv 95.3 plt 185; glucose 107; bun 31; creat 1.44; k+ 4.5; na++ 138; ca 8.6; GFR 50; d-dimer 1.44 CRP 0.8   11-04-21 tsh 1.660 01-04-22: hgb a1c 5.3  03-17-22: tsh 1.804 04-07-22: wbc 6.6; hgb 12.0; hct 35.8; mcv 96.2plt 201; glucose 92; bun 31; creat 1.29; k+ 4.3; na++ 140; ca 8.4; gfr 57; protein 5.6; albumin 2.8; chol 98; ldl 49; trig 67; hdl 36; urine micro-albumin <3.0  NO NEW LABS.    Review of Systems  Constitutional:  Negative for malaise/fatigue.  Respiratory:  Negative for cough and shortness of breath.   Cardiovascular:  Negative for chest pain, palpitations and leg swelling.  Gastrointestinal:  Negative for abdominal pain, constipation and heartburn.  Musculoskeletal:  Negative for back pain, joint pain and myalgias.  Skin: Negative.   Neurological:  Negative for dizziness.  Psychiatric/Behavioral:  The patient is not nervous/anxious.    Physical Exam Constitutional:      General: He is not in acute distress.    Appearance: He is well-developed. He is not diaphoretic.  Neck:     Thyroid: No thyromegaly.  Cardiovascular:     Rate and Rhythm: Normal rate and regular rhythm.     Pulses: Normal pulses.     Heart sounds: Normal heart sounds.  Pulmonary:     Effort: Pulmonary effort is normal. No respiratory distress.     Breath sounds: Normal breath sounds.  Abdominal:     General: Bowel sounds are normal. There is no distension.     Palpations: Abdomen is soft.     Tenderness: There is no abdominal tenderness.  Musculoskeletal:        General: Normal range of motion.     Cervical back: Neck supple.  Lymphadenopathy:     Cervical: No cervical adenopathy.  Skin:    General: Skin is warm and dry.  Neurological:     Mental Status: He is alert. Mental status is at baseline.  Psychiatric:         Mood and Affect: Mood normal.                ASSESSMENT/ PLAN:  TODAY  CKD stage 3 associated with type 2 diabetes mellitus: bun 31; creat 1.29; gfr 57  2. Mild protein calorie malnutrition: albumin 2.8 will continue supplements as directed  3. Hyperlipidemia associated with type 2 diabetes mellitus: ldl 49; will  continue lipitor 40 mg daily   4. Type 2 diabetes mellitus with neurological manifestations: hgb A1c 5.3; on statin; asa    PREVIOUS   5. Osteoporosis t score -2.863 will continue to monitor   6. Hypertension associated with type 2 diabetes mellitus: b/p 121/76 is not on medications  7. Chronic constipation: will continue senna s 2 tabs twice daily as needed   8. GERD without esophagitis: will continue prilosec 20 mg daily   9. History of CVA will continue asa 162 mg daily is off plavix  10. Thyroid disease: tsh 1.804 will continue synthroid 75 mcg daily   11. Hemiplegia and hemiparesis following cerebral infarction unspecified site: is on asa 162 mg daily   12. Vascular dementia without behavioral disturbance; psychotic disturbance; mood disturbance or anxiety: weight is 162 pounds; will continue namenda xr 28 mg daily    Will check cbc; cmp lipids hgb A1c    Synthia Innocent NP [facility]   call [phone]

## 2022-10-10 ENCOUNTER — Other Ambulatory Visit (HOSPITAL_COMMUNITY)
Admission: RE | Admit: 2022-10-10 | Discharge: 2022-10-10 | Disposition: A | Discharge status: Home / Self Care | Payer: Medicare Other | Source: Skilled Nursing Facility | Attending: Adult Health | Admitting: Adult Health

## 2022-10-10 DIAGNOSIS — E1149 Type 2 diabetes mellitus with other diabetic neurological complication: Secondary | ICD-10-CM | POA: Diagnosis present

## 2022-10-10 LAB — COMPREHENSIVE METABOLIC PANEL
ALT: 26 U/L (ref 0–44)
AST: 19 U/L (ref 15–41)
Albumin: 2.7 g/dL — ABNORMAL LOW (ref 3.5–5.0)
Alkaline Phosphatase: 112 U/L (ref 38–126)
Anion gap: 3 — ABNORMAL LOW (ref 5–15)
BUN: 40 mg/dL — ABNORMAL HIGH (ref 8–23)
CO2: 26 mmol/L (ref 22–32)
Calcium: 8.2 mg/dL — ABNORMAL LOW (ref 8.9–10.3)
Chloride: 108 mmol/L (ref 98–111)
Creatinine, Ser: 1.56 mg/dL — ABNORMAL HIGH (ref 0.61–1.24)
GFR, Estimated: 45 mL/min — ABNORMAL LOW (ref >60)
Glucose, Bld: 112 mg/dL — ABNORMAL HIGH (ref 70–99)
Potassium: 4.4 mmol/L (ref 3.5–5.1)
Sodium: 137 mmol/L (ref 135–145)
Total Bilirubin: 0.1 mg/dL — ABNORMAL LOW (ref 0.3–1.2)
Total Protein: 5.6 g/dL — ABNORMAL LOW (ref 6.5–8.1)

## 2022-10-10 LAB — CBC
HCT: 35.2 % — ABNORMAL LOW (ref 39.0–52.0)
Hemoglobin: 11.4 g/dL — ABNORMAL LOW (ref 13.0–17.0)
MCH: 30.8 pg (ref 26.0–34.0)
MCHC: 32.4 g/dL (ref 30.0–36.0)
MCV: 95.1 fL (ref 80.0–100.0)
Platelets: 251 10*3/uL (ref 150–400)
RBC: 3.7 MIL/uL — ABNORMAL LOW (ref 4.22–5.81)
RDW: 13 % (ref 11.5–15.5)
WBC: 8.4 10*3/uL (ref 4.0–10.5)
nRBC: 0 % (ref 0.0–0.2)

## 2022-10-10 LAB — LIPID PANEL
Cholesterol: 110 mg/dL (ref 0–200)
HDL: 29 mg/dL — ABNORMAL LOW (ref >40)
LDL Cholesterol: 61 mg/dL (ref 0–99)
Total CHOL/HDL Ratio: 3.8 RATIO
Triglycerides: 98 mg/dL (ref <150)
VLDL: 20 mg/dL (ref 0–40)

## 2022-10-10 LAB — HEMOGLOBIN A1C
Hgb A1c MFr Bld: 6.2 % — ABNORMAL HIGH (ref 4.8–5.6)
Mean Plasma Glucose: 131.24 mg/dL

## 2022-11-03 ENCOUNTER — Encounter: Payer: Self-pay | Admitting: Internal Medicine

## 2022-11-03 ENCOUNTER — Non-Acute Institutional Stay (SKILLED_NURSING_FACILITY): Payer: Medicare Other | Admitting: Internal Medicine

## 2022-11-03 DIAGNOSIS — D649 Anemia, unspecified: Secondary | ICD-10-CM

## 2022-11-03 DIAGNOSIS — I10 Essential (primary) hypertension: Secondary | ICD-10-CM

## 2022-11-03 DIAGNOSIS — E441 Mild protein-calorie malnutrition: Secondary | ICD-10-CM | POA: Diagnosis not present

## 2022-11-03 DIAGNOSIS — E782 Mixed hyperlipidemia: Secondary | ICD-10-CM

## 2022-11-03 DIAGNOSIS — E1149 Type 2 diabetes mellitus with other diabetic neurological complication: Secondary | ICD-10-CM

## 2022-11-03 NOTE — Assessment & Plan Note (Addendum)
Essentially stable to minimal progression. No bleeding dyscrasias reported by staff. Continue to monitor.

## 2022-11-03 NOTE — Assessment & Plan Note (Signed)
LDL 61 , @ goal of < 70. No change indicated.

## 2022-11-03 NOTE — Assessment & Plan Note (Addendum)
DM control excellent as documented by current A1c of 6.2%. No change indicated.

## 2022-11-03 NOTE — Patient Instructions (Signed)
See assessment and plan under each diagnosis in the problem list and acutely for this visit 

## 2022-11-03 NOTE — Progress Notes (Signed)
NURSING HOME LOCATION:  [facility] ROOM NUMBER:  111 W  CODE STATUS: DNR    PCP:  Synthia Innocent NP  This is a nursing facility follow up visit of chronic medical diagnoses & to document compliance with Regulation 483.30 (c) in The Long Term Care Survey Manual Phase 2 which mandates caregiver visit ( visits can alternate among physician, PA or NP as per statutes) within 10 days of 30 days / 60 days/ 90 days post admission to SNF date    Interim medical record and care since last SNF visit was updated with review of diagnostic studies and change in clinical status since last visit were documented.  HPI: He is a permanent resident of this facility with medical diagnoses of history of hypertension, hypothyroidism, DJD, and diabetes with peripheral vascular disease. Labs are current as of 10/10/2022.  He has had progression of his CKD with current creatinine of 1.56 and GFR 45 indicating borderline 3A/3B CKD.  Prior values of 1.29/57 indicating high stage IIIa CKD.  Albumin is 2.7 and total protein 5.6 indicating protein/caloric malnutrition.  LDL is at goal at 61.  There is been slight progression of his anemia with current H/H of 11.4/35.2.  Prior values were 12/35.8.  Diabetic control is excellent as manifested by an A1c of 6.2%.  Review of systems: He was absent from the SNF for an Ophthalmology visit.  He states he has been diagnosed with macular degeneration and is to return in the near future for definitive treatment.  He validates that he had "low protein count" and is on a supplement.  He states that with the supplement he is having frequent stools but not frank diarrhea.  He does deny frank dysphagia but must eat slowly since he had a stroke.  He describes residual numbness in his right hand which he attributes to having had a stroke.  Constitutional: No fever, significant weight change, fatigue  Eyes: No redness, discharge, pain ENT/mouth: No nasal congestion,  purulent  discharge, earache, change in hearing, sore throat  Cardiovascular: No chest pain, palpitations, paroxysmal nocturnal dyspnea, edema  Respiratory: No cough, sputum production, hemoptysis, DOE, significant snoring, apnea   Gastrointestinal: No heartburn, abdominal pain, nausea /vomiting, rectal bleeding, melena Genitourinary: No dysuria, hematuria, pyuria, incontinence, nocturia Musculoskeletal: No joint stiffness, joint swelling, weakness, pain Dermatologic: No rash, pruritus, change in appearance of skin Neurologic: No dizziness, headache, syncope, seizures Psychiatric: No significant anxiety, depression, insomnia, anorexia Endocrine: No change in hair/skin/nails, excessive thirst, excessive hunger, excessive urination  Hematologic/lymphatic: No significant bruising, lymphadenopathy, abnormal bleeding Allergy/immunology: No itchy/watery eyes, significant sneezing, urticaria, angioedema  Physical exam:  Pertinent or positive findings: Eyebrows are decreased laterally.  Right eyebrow is higher than the left.  He has bilateral ptosis, greater on the left.  Pupils are dilated.  Arcus senilis is suggested.  Breath sounds are decreased.  There is intermittent splitting of the first heart sound.  Pedal pulses are decreased.  He has trace edema at the sock line.  There is marked interosseous wasting.  He also has marked mixed arthritic changes in the hands with deformities and deviations.  General appearance: Adequately nourished; no acute distress, increased work of breathing is present.   Lymphatic: No lymphadenopathy about the head, neck, axilla. Eyes: No conjunctival inflammation or lid edema is present. There is no scleral icterus. Ears:  External ear exam shows no significant lesions or deformities.   Nose:  External nasal examination shows no deformity or inflammation. Nasal mucosa  are pink and moist without lesions, exudates Oral exam:  Lips and gums are healthy appearing. There is no  oropharyngeal erythema or exudate. Neck:  No thyromegaly, masses, tenderness noted.    Heart:  Normal rate and regular rhythm. S2 normal without gallop, murmur, click, rub .  Lungs: Chest clear to auscultation without wheezes, rhonchi, rales, rubs. Abdomen: Bowel sounds are normal. Abdomen is soft and nontender with no organomegaly, hernias, masses. GU: Deferred  Extremities:  No cyanosis, clubbing Neurologic exam :Balance, Rhomberg, finger to nose testing could not be completed due to clinical state Skin: Warm & dry w/o tenting. No significant lesions or rash.  See summary under each active problem in the Problem List with associated updated therapeutic plan

## 2022-11-03 NOTE — Assessment & Plan Note (Addendum)
10/10/22 albumin 2.7, total protein 5.6. Nutritionist has prescribed supplement & is monitoring.

## 2022-11-03 NOTE — Assessment & Plan Note (Signed)
BP soft w/o anti -HTN meds. Continue monitor.

## 2022-11-25 ENCOUNTER — Non-Acute Institutional Stay (SKILLED_NURSING_FACILITY): Payer: Medicare Other | Admitting: Adult Health

## 2022-11-25 ENCOUNTER — Encounter: Payer: Self-pay | Admitting: Adult Health

## 2022-11-25 DIAGNOSIS — E1159 Type 2 diabetes mellitus with other circulatory complications: Secondary | ICD-10-CM

## 2022-11-25 DIAGNOSIS — I700 Atherosclerosis of aorta: Secondary | ICD-10-CM

## 2022-11-25 DIAGNOSIS — I152 Hypertension secondary to endocrine disorders: Secondary | ICD-10-CM | POA: Diagnosis not present

## 2022-11-25 DIAGNOSIS — F01C0 Vascular dementia, severe, without behavioral disturbance, psychotic disturbance, mood disturbance, and anxiety: Secondary | ICD-10-CM | POA: Diagnosis not present

## 2022-11-25 NOTE — Progress Notes (Signed)
Location:  [facility] Room Number: 111W Place of Service:  SNF (31)   CODE STATUS: DNR  No Known Allergies  Chief Complaint  Patient presents with   Acute Visit    Care plan meeting    HPI:  We have come together for his care plan meeting. BIMS 15/15 mood 4/30: not eating well; trouble concentrating. 11-16-22: SLUMS 15/30: indicating dementia. He is nonambulatory; uses wheelchair; no falls. He requires moderate to dependent assist with his adls. He is incontinent of bladder and bowel. Dietary: regular diet: appetite 75-100%; weight is 160 pounds; requires setup for meals. Therapy: none at this time. Activities: does not attend. He will continue to be followed for his chronic illnesses including:   Aortic atherosclerosis Hypertension associated with type 2 diabetes mellitus  Severe vascular dementia without behavioral disturbance; psychotic disturbance; mood disturbance; anxiety  Past Medical History:  Diagnosis Date   Hypertension    Benign Essential   Hypothyroidism    Obesity    Osteoarthritis    PVD (peripheral vascular disease) (HCC)    Type 2 diabetes mellitus with vascular complications     Past Surgical History:  Procedure Laterality Date   APPENDECTOMY     TONSILLECTOMY      Social History   Socioeconomic History   Marital status: Single    Spouse name: Not on file   Number of children: Not on file   Years of education: 12   Highest education level: Not on file  Occupational History   Occupation: RETIRED  Tobacco Use   Smoking status: Former   Smokeless tobacco: Never  Building services engineer Use: Never used  Substance and Sexual Activity   Alcohol use: Not Currently   Drug use: Never   Sexual activity: Not Currently  Other Topics Concern   Not on file  Social History Narrative   Not on file   Social Determinants of Health   Financial Resource Strain: Not on file  Food Insecurity: Not on file  Transportation Needs: Not on file   Physical Activity: Not on file  Stress: Not on file  Social Connections: Not on file  Intimate Partner Violence: Not on file   Family History  Problem Relation Age of Onset   Osteoporosis Mother    Hypertension Mother    Cancer Mother    Heart attack Father    Heart attack Maternal Grandmother    Pneumonia Maternal Grandfather    Heart disease Paternal Grandmother    Heart disease Paternal Grandfather       VITAL SIGNS BP 112/68   Pulse 68   Temp 98.1 F (36.7 C)   Resp 20   Ht 5\' 11"  (1.803 m)   Wt 160 lb 3.2 oz (72.7 kg)   SpO2 97%   BMI 22.34 kg/m   Outpatient Encounter Medications as of 11/25/2022  Medication Sig   acetaminophen (TYLENOL) 325 MG tablet Take 650 mg by mouth every 6 (six) hours as needed.   aspirin EC 81 MG tablet Take 162 mg by mouth daily. Swallow whole.   atorvastatin (LIPITOR) 40 MG tablet Take 40 mg by mouth daily.   calcium-vitamin D (OSCAL WITH D) 500-5 MG-MCG tablet Take 1 tablet by mouth daily.   levothyroxine (SYNTHROID) 75 MCG tablet Take 75 mcg by mouth daily before breakfast.   memantine (NAMENDA XR) 28 MG CP24 24 hr capsule Take 28 mg by mouth daily. 9 am   NON FORMULARY Diet:Regular Diet with Thin  Liquids   omeprazole (PRILOSEC OTC) 20 MG tablet Take 20 mg by mouth at bedtime.   senna-docusate (SENOKOT-S) 8.6-50 MG tablet Take 2 tablets by mouth 2 (two) times daily as needed. hold for diarrhea   sertraline (ZOLOFT) 25 MG tablet Take 25 mg by mouth daily.   Throat Lozenges (LOZENGES MT) Use as directed in the mouth or throat.  1; oral,Every Shift Special Instructions: sore throat   No facility-administered encounter medications on file as of 11/25/2022.     SIGNIFICANT DIAGNOSTIC EXAMS  PREVIOUS   02-09-22: dex scan: t score -2.863  NO NEW EXAMS.    LABS REVIEWED: PREVIOUS     11-04-21 tsh 1.660 01-04-22: hgb a1c 5.3  03-17-22: tsh 1.804 04-07-22: wbc 6.6; hgb 12.0; hct 35.8; mcv 96.2plt 201; glucose 92; bun 31; creat 1.29;  k+ 4.3; na++ 140; ca 8.4; gfr 57; protein 5.6; albumin 2.8; chol 98; ldl 49; trig 67; hdl 36; urine micro-albumin <3.0  TODAY  10-10-22: wbc 8.4; hgb 11.4; hct 35.2; mcv 95.1 plt 251; glucose 112; bun 40; creat 1.56; k+ 4.4; na++ 137; ca 8.2 gfr 45; protein 5.6 albumin 2.7; hgb A1c 6.2 chol 110; ldl 61; trig 98; hdl 29    Review of Systems  Constitutional:  Negative for malaise/fatigue.  Respiratory:  Negative for cough and shortness of breath.   Cardiovascular:  Negative for chest pain, palpitations and leg swelling.  Gastrointestinal:  Negative for abdominal pain, constipation and heartburn.  Musculoskeletal:  Negative for back pain, joint pain and myalgias.  Skin: Negative.   Neurological:  Negative for dizziness.  Psychiatric/Behavioral:  The patient is not nervous/anxious.     Physical Exam Constitutional:      General: He is not in acute distress.    Appearance: He is well-developed. He is not diaphoretic.  Neck:     Thyroid: No thyromegaly.  Cardiovascular:     Rate and Rhythm: Normal rate and regular rhythm.     Pulses: Normal pulses.     Heart sounds: Normal heart sounds.  Pulmonary:     Effort: Pulmonary effort is normal. No respiratory distress.     Breath sounds: Normal breath sounds.  Abdominal:     General: Bowel sounds are normal. There is no distension.     Palpations: Abdomen is soft.     Tenderness: There is no abdominal tenderness.  Musculoskeletal:        General: Normal range of motion.     Cervical back: Neck supple.     Right lower leg: No edema.     Left lower leg: No edema.  Lymphadenopathy:     Cervical: No cervical adenopathy.  Skin:    General: Skin is warm and dry.  Neurological:     Mental Status: He is alert. Mental status is at baseline.  Psychiatric:        Mood and Affect: Mood normal.       ASSESSMENT/ PLAN:  TODAY  Aortic atherosclerosis Hypertension associated with type 2 diabetes mellitus Severe vascular dementia without  behavioral disturbance; psychotic disturbance; mood disturbance; anxiety  Will continue current medications Will continue current plan of care Will continue to monitor his status.   Time spent with patient: 40 minutes: medications; dietary; activities.    Synthia Innocent NP [facility]   call [phone]

## 2022-11-30 ENCOUNTER — Encounter: Payer: Self-pay | Admitting: Adult Health

## 2022-11-30 ENCOUNTER — Non-Acute Institutional Stay (SKILLED_NURSING_FACILITY): Payer: Medicare Other | Admitting: Adult Health

## 2022-11-30 DIAGNOSIS — E1159 Type 2 diabetes mellitus with other circulatory complications: Secondary | ICD-10-CM | POA: Diagnosis not present

## 2022-11-30 DIAGNOSIS — I152 Hypertension secondary to endocrine disorders: Secondary | ICD-10-CM

## 2022-11-30 DIAGNOSIS — K5909 Other constipation: Secondary | ICD-10-CM

## 2022-11-30 DIAGNOSIS — M810 Age-related osteoporosis without current pathological fracture: Secondary | ICD-10-CM | POA: Diagnosis not present

## 2022-11-30 NOTE — Progress Notes (Signed)
Location:  [facility] Room Number: 111W Place of Service:  SNF (31)   CODE STATUS: DNR  No Known Allergies  Chief Complaint  Patient presents with   Medical Management of Chronic Issues               Osteoporosis:   Hypertension associated with type 2 diabetes mellitus:Chronic constipation   HPI:  He is a 77 year old long term resident of this facility being seen for the management of his chronic illnesses:  Osteoporosis:   Hypertension associated with type 2 diabetes mellitus:Chronic constipation. There are no reports of uncontrolled pain. No reports of constipation; no reports of anxiety or agitation.   Past Medical History:  Diagnosis Date   Hypertension    Benign Essential   Hypothyroidism    Obesity    Osteoarthritis    PVD (peripheral vascular disease) (HCC)    Type 2 diabetes mellitus with vascular complications     Past Surgical History:  Procedure Laterality Date   APPENDECTOMY     TONSILLECTOMY      Social History   Socioeconomic History   Marital status: Single    Spouse name: Not on file   Number of children: Not on file   Years of education: 12   Highest education level: Not on file  Occupational History   Occupation: RETIRED  Tobacco Use   Smoking status: Former   Smokeless tobacco: Never  Building services engineer Use: Never used  Substance and Sexual Activity   Alcohol use: Not Currently   Drug use: Never   Sexual activity: Not Currently  Other Topics Concern   Not on file  Social History Narrative   Not on file   Social Determinants of Health   Financial Resource Strain: Not on file  Food Insecurity: Not on file  Transportation Needs: Not on file  Physical Activity: Not on file  Stress: Not on file  Social Connections: Not on file  Intimate Partner Violence: Not on file   Family History  Problem Relation Age of Onset   Osteoporosis Mother    Hypertension Mother    Cancer Mother    Heart attack Father     Heart attack Maternal Grandmother    Pneumonia Maternal Grandfather    Heart disease Paternal Grandmother    Heart disease Paternal Grandfather       VITAL SIGNS BP 100/61   Pulse 73   Temp 97.6 F (36.4 C)   Resp 14   Ht 5\' 11"  (1.803 m)   Wt 160 lb (72.6 kg)   SpO2 97%   BMI 22.32 kg/m   Outpatient Encounter Medications as of 11/30/2022  Medication Sig   acetaminophen (TYLENOL) 325 MG tablet Take 650 mg by mouth every 6 (six) hours as needed.   Amino Acids-Protein Hydrolys (PRO-STAT AWC) LIQD Take 30 mLs by mouth in the morning and at bedtime.   aspirin EC 81 MG tablet Take 162 mg by mouth daily. Swallow whole.   atorvastatin (LIPITOR) 40 MG tablet Take 40 mg by mouth daily.   calcium-vitamin D (OSCAL WITH D) 500-5 MG-MCG tablet Take 1 tablet by mouth daily.   levothyroxine (SYNTHROID) 75 MCG tablet Take 75 mcg by mouth daily before breakfast.   memantine (NAMENDA XR) 28 MG CP24 24 hr capsule Take 28 mg by mouth daily. 9 am   NON FORMULARY Diet:Regular Diet with Thin Liquids   omeprazole (PRILOSEC OTC) 20 MG tablet Take 20 mg by  mouth at bedtime.   senna-docusate (SENOKOT-S) 8.6-50 MG tablet Take 2 tablets by mouth 2 (two) times daily as needed. hold for diarrhea   sertraline (ZOLOFT) 25 MG tablet Take 25 mg by mouth daily.   Throat Lozenges (LOZENGES MT) Use as directed in the mouth or throat.  1; oral,Every Shift Special Instructions: sore throat   No facility-administered encounter medications on file as of 11/30/2022.     SIGNIFICANT DIAGNOSTIC EXAMS  PREVIOUS   02-09-22: dex scan: t score -2.863  NO NEW EXAMS.    LABS REVIEWED: PREVIOUS   01-04-22: hgb a1c 5.3  03-17-22: tsh 1.804 04-07-22: wbc 6.6; hgb 12.0; hct 35.8; mcv 96.2plt 201; glucose 92; bun 31; creat 1.29; k+ 4.3; na++ 140; ca 8.4; gfr 57; protein 5.6; albumin 2.8; chol 98; ldl 49; trig 67; hdl 36; urine micro-albumin <3.0  TODAY  10-10-22: wbc 8.4; hgb 11.4; hct 35.2; mcv 95.1 plt 251; glucose 112;  bun 40; creat 1.56; k+ 4.4; na++ 137; ca 8.2; gfr 45; protein 5.6 albumin 2.7 hgb A1c 6.2; chol 110; ldl 61; tri 98; hdl 29    Review of Systems  Constitutional:  Negative for malaise/fatigue.  Respiratory:  Negative for cough and shortness of breath.   Cardiovascular:  Negative for chest pain, palpitations and leg swelling.  Gastrointestinal:  Negative for abdominal pain, constipation and heartburn.  Musculoskeletal:  Negative for back pain, joint pain and myalgias.  Skin: Negative.   Neurological:  Negative for dizziness.  Psychiatric/Behavioral:  The patient is not nervous/anxious.    Physical Exam Constitutional:      General: He is not in acute distress.    Appearance: He is well-developed. He is not diaphoretic.  Neck:     Thyroid: No thyromegaly.  Cardiovascular:     Rate and Rhythm: Normal rate and regular rhythm.     Pulses: Normal pulses.     Heart sounds: Normal heart sounds.  Pulmonary:     Effort: Pulmonary effort is normal. No respiratory distress.     Breath sounds: Normal breath sounds.  Abdominal:     General: Bowel sounds are normal. There is no distension.     Palpations: Abdomen is soft.     Tenderness: There is no abdominal tenderness.  Musculoskeletal:        General: Normal range of motion.     Cervical back: Neck supple.  Lymphadenopathy:     Cervical: No cervical adenopathy.  Skin:    General: Skin is warm and dry.  Neurological:     Mental Status: He is alert. Mental status is at baseline.  Psychiatric:        Mood and Affect: Mood normal.                  ASSESSMENT/ PLAN:  TODAY  Osteoporosis: t score -2.863 will monitor   2. Hypertension associated with type 2 diabetes mellitus: b/p 100/61 will monitor   3. Chronic constipation: will continue senna s 2 tabs twice daily as needed   PREVIOUS   4. GERD without esophagitis: will continue prilosec 20 mg daily   5. History of CVA will continue asa 162 mg daily is off plavix  6.  Thyroid disease: tsh 1.804 will continue synthroid 75 mcg daily   7. Hemiplegia and hemiparesis following cerebral infarction unspecified site: is on asa 162 mg daily   8. Vascular dementia without behavioral disturbance; psychotic disturbance; mood disturbance or anxiety: weight is 160 pounds; will continue namenda xr 28  mg daily   9. CKD stage 3 associated with type 2 diabetes mellitus: bun 40; creat 1.56; gfr 45  10. Mild protein calorie malnutrition: albumin 2.7 will continue supplements as directed  11. Hyperlipidemia associated with type 2 diabetes mellitus: ldl 61; will continue lipitor 40 mg daily   12. Type 2 diabetes mellitus with neurological manifestations: hgb A1c 6.2; on statin; asa      Synthia Innocent NP [facility]  call [phone]

## 2023-01-02 ENCOUNTER — Non-Acute Institutional Stay: Payer: Self-pay | Admitting: Adult Health

## 2023-01-02 ENCOUNTER — Encounter: Payer: Self-pay | Admitting: Adult Health

## 2023-01-02 DIAGNOSIS — K219 Gastro-esophageal reflux disease without esophagitis: Secondary | ICD-10-CM

## 2023-01-02 DIAGNOSIS — Z8673 Personal history of transient ischemic attack (TIA), and cerebral infarction without residual deficits: Secondary | ICD-10-CM

## 2023-01-02 DIAGNOSIS — E039 Hypothyroidism, unspecified: Secondary | ICD-10-CM | POA: Diagnosis not present

## 2023-01-02 NOTE — Progress Notes (Unsigned)
Location:  [facility] Room Number: 111 Place of Service:  SNF (31)   CODE STATUS: dnr   No Known Allergies  Chief Complaint  Patient presents with   Medical Management of Chronic Issues                  GERD without esophagitis: History of CVA  Thyroid disease     HPI:  He is a 77 year old long term resident of this facility being seen for the management of his chronic illnesses:  GERD without esophagitis: History of CVA  Thyroid disease. There are no reports of uncontrolled pain. There are no reports of agitation or anxiety. His weight is stable.   Past Medical History:  Diagnosis Date   Hypertension    Benign Essential   Hypothyroidism    Obesity    Osteoarthritis    PVD (peripheral vascular disease) (HCC)    Type 2 diabetes mellitus with vascular complications     Past Surgical History:  Procedure Laterality Date   APPENDECTOMY     TONSILLECTOMY      Social History   Socioeconomic History   Marital status: Single    Spouse name: Not on file   Number of children: Not on file   Years of education: 12   Highest education level: Not on file  Occupational History   Occupation: RETIRED  Tobacco Use   Smoking status: Former   Smokeless tobacco: Never  Building services engineer Use: Never used  Substance and Sexual Activity   Alcohol use: Not Currently   Drug use: Never   Sexual activity: Not Currently  Other Topics Concern   Not on file  Social History Narrative   Not on file   Social Determinants of Health   Financial Resource Strain: Not on file  Food Insecurity: Not on file  Transportation Needs: Not on file  Physical Activity: Not on file  Stress: Not on file  Social Connections: Not on file  Intimate Partner Violence: Not on file   Family History  Problem Relation Age of Onset   Osteoporosis Mother    Hypertension Mother    Cancer Mother    Heart attack Father    Heart attack Maternal Grandmother    Pneumonia Maternal  Grandfather    Heart disease Paternal Grandmother    Heart disease Paternal Grandfather       VITAL SIGNS BP 110/62   Pulse 78   Temp (!) 97.1 F (36.2 C)   Resp 20   Ht 5\' 11"  (1.803 m)   Wt 169 lb 12.8 oz (77 kg)   SpO2 98%   BMI 23.68 kg/m   Outpatient Encounter Medications as of 01/02/2023  Medication Sig   acetaminophen (TYLENOL) 325 MG tablet Take 650 mg by mouth every 6 (six) hours as needed.   Amino Acids-Protein Hydrolys (PRO-STAT AWC) LIQD Take 30 mLs by mouth in the morning and at bedtime.   aspirin EC 81 MG tablet Take 162 mg by mouth daily. Swallow whole.   atorvastatin (LIPITOR) 40 MG tablet Take 40 mg by mouth daily.   calcium-vitamin D (OSCAL WITH D) 500-5 MG-MCG tablet Take 1 tablet by mouth daily.   levothyroxine (SYNTHROID) 75 MCG tablet Take 75 mcg by mouth daily before breakfast.   memantine (NAMENDA XR) 28 MG CP24 24 hr capsule Take 28 mg by mouth daily. 9 am   NON FORMULARY Diet:Regular Diet with Thin Liquids   omeprazole (PRILOSEC  OTC) 20 MG tablet Take 20 mg by mouth at bedtime.   senna-docusate (SENOKOT-S) 8.6-50 MG tablet Take 2 tablets by mouth 2 (two) times daily as needed. hold for diarrhea   sertraline (ZOLOFT) 25 MG tablet Take 25 mg by mouth daily.   Throat Lozenges (LOZENGES MT) Use as directed in the mouth or throat.  1; oral,Every Shift Special Instructions: sore throat   No facility-administered encounter medications on file as of 01/02/2023.     SIGNIFICANT DIAGNOSTIC EXAMS  PREVIOUS   02-09-22: dex scan: t score -2.863  NO NEW EXAMS.    LABS REVIEWED: PREVIOUS   01-04-22: hgb a1c 5.3  03-17-22: tsh 1.804 04-07-22: wbc 6.6; hgb 12.0; hct 35.8; mcv 96.2plt 201; glucose 92; bun 31; creat 1.29; k+ 4.3; na++ 140; ca 8.4; gfr 57; protein 5.6; albumin 2.8; chol 98; ldl 49; trig 67; hdl 36; urine micro-albumin <3.0 10-10-22: wbc 8.4; hgb 11.4; hct 35.2; mcv 95.1 plt 251; glucose 112; bun 40; creat 1.56; k+ 4.4; na++ 137; ca 8.2; gfr 45;  protein 5.6 albumin 2.7 hgb A1c 6.2; chol 110; ldl 61; tri 98; hdl 29  NO NEW LABS.     Review of Systems  Constitutional:  Negative for malaise/fatigue.  Respiratory:  Negative for cough and shortness of breath.   Cardiovascular:  Negative for chest pain, palpitations and leg swelling.  Gastrointestinal:  Negative for abdominal pain, constipation and heartburn.  Musculoskeletal:  Negative for back pain, joint pain and myalgias.  Skin: Negative.   Neurological:  Negative for dizziness.  Psychiatric/Behavioral:  The patient is not nervous/anxious.    Physical Exam Constitutional:      General: He is not in acute distress.    Appearance: He is well-developed. He is not diaphoretic.  Neck:     Thyroid: No thyromegaly.  Cardiovascular:     Rate and Rhythm: Normal rate and regular rhythm.     Pulses: Normal pulses.     Heart sounds: Normal heart sounds.  Pulmonary:     Effort: Pulmonary effort is normal. No respiratory distress.     Breath sounds: Normal breath sounds.  Abdominal:     General: Bowel sounds are normal. There is no distension.     Palpations: Abdomen is soft.     Tenderness: There is no abdominal tenderness.  Musculoskeletal:        General: Normal range of motion.     Cervical back: Neck supple.  Lymphadenopathy:     Cervical: No cervical adenopathy.  Skin:    General: Skin is warm and dry.  Neurological:     Mental Status: He is alert. Mental status is at baseline.  Psychiatric:        Mood and Affect: Mood normal.                    ASSESSMENT/ PLAN:  TODAY  GERD without esophagitis: will continue prilosec 20 mg daily   2. History of CVA: will continue asa 162 mg daily is off plavix  3. Thyroid disease: tsh 1.804; will continue synthroid 75 mcg daily   PREVIOUS   4. Hemiplegia and hemiparesis following cerebral infarction unspecified site: is on asa 162 mg daily   5. Vascular dementia without behavioral disturbance; psychotic disturbance; mood  disturbance or anxiety: weight is 169 pounds; will continue namenda xr 28 mg daily   6. CKD stage 3 associated with type 2 diabetes mellitus: bun 40; creat 1.56; gfr 45  7. Mild protein calorie malnutrition:  albumin 2.7 will continue supplements as directed  8. Hyperlipidemia associated with type 2 diabetes mellitus: ldl 61; will continue lipitor 40 mg daily   9. Type 2 diabetes mellitus with neurological manifestations: hgb A1c 6.2; on statin; asa   10. Osteoporosis: t score -2.863 will monitor   11. Hypertension associated with type 2 diabetes mellitus: b/p 110/62 will monitor   12. Chronic constipation: will continue senna s 2 tabs twice daily as needed     Synthia Innocent NP [facility]   call [phone]

## 2023-02-01 ENCOUNTER — Non-Acute Institutional Stay (SKILLED_NURSING_FACILITY): Payer: Medicare Other | Admitting: Internal Medicine

## 2023-02-01 ENCOUNTER — Encounter: Payer: Self-pay | Admitting: Internal Medicine

## 2023-02-01 DIAGNOSIS — N183 Chronic kidney disease, stage 3 unspecified: Secondary | ICD-10-CM | POA: Diagnosis not present

## 2023-02-01 DIAGNOSIS — E1122 Type 2 diabetes mellitus with diabetic chronic kidney disease: Secondary | ICD-10-CM

## 2023-02-01 DIAGNOSIS — E039 Hypothyroidism, unspecified: Secondary | ICD-10-CM | POA: Diagnosis not present

## 2023-02-01 DIAGNOSIS — I2510 Atherosclerotic heart disease of native coronary artery without angina pectoris: Secondary | ICD-10-CM

## 2023-02-01 NOTE — Assessment & Plan Note (Signed)
There has been progression of CKD with most recent creatinine of 1.56 and GFR 45 indicating borderline stage IIIa/IIIb CKD.  Prior to this time he had CKD stage III a. A1c indicated excellent control with a value of 6.2%.  Renal function can be updated when TSH is checked.

## 2023-02-01 NOTE — Progress Notes (Unsigned)
NURSING HOME LOCATION:  [facility] ROOM NUMBER:  [id number]  CODE STATUS:  DNR  PCP:  Synthia Innocent NP  This is a nursing facility follow up visit of chronic medical diagnoses & to document compliance with Regulation 483.30 (c) in The Long Term Care Survey Manual Phase 2 which mandates caregiver visit ( visits can alternate among physician, PA or NP as per statutes) within 10 days of 30 days / 60 days/ 90 days post admission to SNF date    Interim medical record and care since last SNF visit was updated with review of diagnostic studies and change in clinical status since last visit were documented.  HPI: He is a permanent resident of this facility with medical diagnoses of essential hypertension, hypothyroidism, and diabetes with peripheral vascular complications. Most recent labs were completed 10/10/2022 and revealed progression of CKD with a creatinine 1.56 and GFR 45 indicating borderline stage IIIa/IIIb CKD.  Previous values of 1.29 and 57 indicated high CKD stage IIIa.  LDL was slightly above goal of less than 50 in the context of coronary atherosclerosis with a value of 61.  There had been slight progression of anemia with H/H of 11.4/35.2.  A1c indicating excellent diabetic control with a value of 6.2%. He is on L-thyroxine 75 mcg/day; last TSH was therapeutic at 1.804 on 03/17/2022.  Review of systems: Ophthalmologist has been injecting his eyes for macular degeneration every 6-8 weeks with improvement on the right but unfortunately now on the left.  Intermittently he describes arthritic pains in his hands.  His dysphagia from the stroke is well-controlled when he is he eats slower.  He denies any other active symptoms with special reference to coronary disease history.  Constitutional: No fever, significant weight change, fatigue  Eyes: No redness, discharge, pain, vision change ENT/mouth: No nasal congestion,  purulent discharge, earache, change in hearing, sore throat   Cardiovascular: No chest pain, palpitations, paroxysmal nocturnal dyspnea, claudication, edema  Respiratory: No cough, sputum production, hemoptysis, DOE, significant snoring, apnea   Gastrointestinal: No heartburn, dysphagia, abdominal pain, nausea /vomiting, rectal bleeding, melena, change in bowels Genitourinary: No dysuria, hematuria, pyuria, incontinence, nocturia Musculoskeletal: No joint stiffness, joint swelling, weakness, pain Dermatologic: No rash, pruritus, change in appearance of skin Neurologic: No dizziness, headache, syncope, seizures, numbness, tingling Psychiatric: No significant anxiety, depression, insomnia, anorexia Endocrine: No change in hair/skin/nails, excessive thirst, excessive hunger, excessive urination  Hematologic/lymphatic: No significant bruising, lymphadenopathy, abnormal bleeding Allergy/immunology: No itchy/watery eyes, significant sneezing, urticaria, angioedema  Physical exam:  Pertinent or positive findings: He was 2:00 and he was still in bed.  Initially was sleep but roused easily.  There was no evidence of hypopnea, snoring, or apnea.  Upon awakening bilateral ptosis was noted, greater on the left.  There is subtle decrease in the right nasolabial fold.  He has a grade 1/2 systolic murmur; S2 is slightly increased.  Posterior tibial pulses are stronger than dorsalis pedis pulses.  There was no dramatic asymmetry to strength testing despite the history of significant deficits in the left upper and lower extremities.  General appearance: Adequately nourished; no acute distress, increased work of breathing is present.   Lymphatic: No lymphadenopathy about the head, neck, axilla. Eyes: No conjunctival inflammation or lid edema is present. There is no scleral icterus. Ears:  External ear exam shows no significant lesions or deformities.   Nose:  External nasal examination shows no deformity or inflammation. Nasal mucosa are pink and moist without lesions,  exudates Oral  exam:  Lips and gums are healthy appearing. There is no oropharyngeal erythema or exudate. Neck:  No thyromegaly, masses, tenderness noted.    Heart:  Normal rate and regular rhythm. S1 and S2 normal without gallop, murmur, click, rub .  Lungs: Chest clear to auscultation without wheezes, rhonchi, rales, rubs. Abdomen: Bowel sounds are normal. Abdomen is soft and nontender with no organomegaly, hernias, masses. GU: Deferred  Extremities:  No cyanosis, clubbing, edema  Neurologic exam : Cn 2-7 intact Strength equal  in upper & lower extremities Balance, Rhomberg, finger to nose testing could not be completed due to clinical state Deep tendon reflexes are equal Skin: Warm & dry w/o tenting. No significant lesions or rash.  See summary under each active problem in the Problem List with associated updated therapeutic plan

## 2023-02-01 NOTE — Assessment & Plan Note (Signed)
With a history of coronary atherosclerosis and stroke; LDL goal is less than 50.  Most recent value was 61.  Update lab and adjust statin as indicated as secondary prevention.

## 2023-02-01 NOTE — Assessment & Plan Note (Signed)
He is on L-thyroxine 75 mcg/day; last TSH was therapeutic at 1.804 on 03/17/2022.  TSH can be updated with next blood draw.

## 2023-02-01 NOTE — Patient Instructions (Signed)
See assessment and plan under each diagnosis in the problem list and acutely for this visit 

## 2023-02-05 ENCOUNTER — Encounter (HOSPITAL_COMMUNITY)
Admission: RE | Admit: 2023-02-05 | Discharge: 2023-02-05 | Disposition: A | Discharge status: Home / Self Care | Payer: Medicare Other | Source: Skilled Nursing Facility | Attending: Internal Medicine | Admitting: Internal Medicine

## 2023-02-05 DIAGNOSIS — U071 COVID-19: Secondary | ICD-10-CM | POA: Insufficient documentation

## 2023-02-05 LAB — BASIC METABOLIC PANEL
Anion gap: 7 (ref 5–15)
BUN: 42 mg/dL — ABNORMAL HIGH (ref 8–23)
CO2: 24 mmol/L (ref 22–32)
Calcium: 8.3 mg/dL — ABNORMAL LOW (ref 8.9–10.3)
Chloride: 102 mmol/L (ref 98–111)
Creatinine, Ser: 1.71 mg/dL — ABNORMAL HIGH (ref 0.61–1.24)
GFR, Estimated: 40 mL/min — ABNORMAL LOW (ref >60)
Glucose, Bld: 149 mg/dL — ABNORMAL HIGH (ref 70–99)
Potassium: 3.9 mmol/L (ref 3.5–5.1)
Sodium: 133 mmol/L — ABNORMAL LOW (ref 135–145)

## 2023-02-05 LAB — CBC
HCT: 38.5 % — ABNORMAL LOW (ref 39.0–52.0)
Hemoglobin: 12.8 g/dL — ABNORMAL LOW (ref 13.0–17.0)
MCH: 31.8 pg (ref 26.0–34.0)
MCHC: 33.2 g/dL (ref 30.0–36.0)
MCV: 95.5 fL (ref 80.0–100.0)
Platelets: 205 10*3/uL (ref 150–400)
RBC: 4.03 MIL/uL — ABNORMAL LOW (ref 4.22–5.81)
RDW: 12.6 % (ref 11.5–15.5)
WBC: 9 10*3/uL (ref 4.0–10.5)
nRBC: 0 % (ref 0.0–0.2)

## 2023-02-05 LAB — D-DIMER, QUANTITATIVE: D-Dimer, Quant: 0.45 ug/mL-FEU (ref 0.00–0.50)

## 2023-02-05 LAB — C-REACTIVE PROTEIN: CRP: 3.6 mg/dL — ABNORMAL HIGH (ref <1.0)

## 2023-02-06 ENCOUNTER — Encounter: Payer: Self-pay | Admitting: Adult Health

## 2023-02-06 ENCOUNTER — Other Ambulatory Visit (HOSPITAL_COMMUNITY)
Admission: RE | Admit: 2023-02-06 | Discharge: 2023-02-06 | Disposition: A | Discharge status: Home / Self Care | Payer: Medicare Other | Source: Skilled Nursing Facility | Attending: Adult Health | Admitting: Adult Health

## 2023-02-06 ENCOUNTER — Non-Acute Institutional Stay: Payer: Self-pay | Admitting: Adult Health

## 2023-02-06 DIAGNOSIS — U071 COVID-19: Secondary | ICD-10-CM | POA: Diagnosis not present

## 2023-02-06 DIAGNOSIS — E440 Moderate protein-calorie malnutrition: Secondary | ICD-10-CM

## 2023-02-06 DIAGNOSIS — E119 Type 2 diabetes mellitus without complications: Secondary | ICD-10-CM | POA: Diagnosis not present

## 2023-02-06 DIAGNOSIS — E083393 Diabetes mellitus due to underlying condition with moderate nonproliferative diabetic retinopathy without macular edema, bilateral: Secondary | ICD-10-CM | POA: Diagnosis present

## 2023-02-06 LAB — TSH: TSH: 1.528 u[IU]/mL (ref 0.350–4.500)

## 2023-02-06 LAB — COMPREHENSIVE METABOLIC PANEL
ALT: 21 U/L (ref 0–44)
AST: 16 U/L (ref 15–41)
Albumin: 2.9 g/dL — ABNORMAL LOW (ref 3.5–5.0)
Alkaline Phosphatase: 95 U/L (ref 38–126)
Anion gap: 9 (ref 5–15)
BUN: 38 mg/dL — ABNORMAL HIGH (ref 8–23)
CO2: 21 mmol/L — ABNORMAL LOW (ref 22–32)
Calcium: 8.1 mg/dL — ABNORMAL LOW (ref 8.9–10.3)
Chloride: 104 mmol/L (ref 98–111)
Creatinine, Ser: 1.41 mg/dL — ABNORMAL HIGH (ref 0.61–1.24)
GFR, Estimated: 51 mL/min — ABNORMAL LOW (ref >60)
Glucose, Bld: 121 mg/dL — ABNORMAL HIGH (ref 70–99)
Potassium: 3.7 mmol/L (ref 3.5–5.1)
Sodium: 134 mmol/L — ABNORMAL LOW (ref 135–145)
Total Bilirubin: 0.5 mg/dL (ref 0.3–1.2)
Total Protein: 5.9 g/dL — ABNORMAL LOW (ref 6.5–8.1)

## 2023-02-06 LAB — CBC
HCT: 35.7 % — ABNORMAL LOW (ref 39.0–52.0)
Hemoglobin: 11.8 g/dL — ABNORMAL LOW (ref 13.0–17.0)
MCH: 31.1 pg (ref 26.0–34.0)
MCHC: 33.1 g/dL (ref 30.0–36.0)
MCV: 94.2 fL (ref 80.0–100.0)
Platelets: 185 10*3/uL (ref 150–400)
RBC: 3.79 MIL/uL — ABNORMAL LOW (ref 4.22–5.81)
RDW: 12.5 % (ref 11.5–15.5)
WBC: 6.9 10*3/uL (ref 4.0–10.5)
nRBC: 0 % (ref 0.0–0.2)

## 2023-02-06 NOTE — Progress Notes (Unsigned)
Location:  [facility] Room Number: 111 Place of Service:  SNF ([id number])   CODE STATUS: dnr   No Known Allergies  Chief Complaint  Patient presents with   Acute Visit    COVID +    HPI:  He has tested positive for COVID. There are no reports of fever; no cough; no shortness of breath. There are no changes in appetite or fluid intake. He will need a chest x-ray and blood work.   Past Medical History:  Diagnosis Date   Hypertension    Benign Essential   Hypothyroidism    Obesity    Osteoarthritis    PVD (peripheral vascular disease) (HCC)    Type 2 diabetes mellitus with vascular complications     Past Surgical History:  Procedure Laterality Date   APPENDECTOMY     TONSILLECTOMY      Social History   Socioeconomic History   Marital status: Single    Spouse name: Not on file   Number of children: Not on file   Years of education: 12   Highest education level: Not on file  Occupational History   Occupation: RETIRED  Tobacco Use   Smoking status: Former   Smokeless tobacco: 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 Used  Substance and Sexual Activity   Alcohol use: Not Currently   Drug use: Never   Sexual activity: Not Currently  Other Topics Concern   Not on file  Social History Narrative   Not on file   Social Determinants of Health   Financial Resource Strain: Not on file  Food Insecurity: Not on file  Transportation Needs: Not on file  Physical Activity: Not on file  Stress: Not on file  Social Connections: Not on file  Intimate Partner Violence: Not on file   Family History  Problem Relation Age of Onset   Osteoporosis Mother    Hypertension Mother    Cancer Mother    Heart attack Father    Heart attack Maternal Grandmother    Pneumonia Maternal Grandfather    Heart disease Paternal Grandmother    Heart disease Paternal Grandfather       VITAL SIGNS BP (!) 140/72   Pulse 72   Temp 98.1 F (36.7 C)   Resp 20    Ht 5\' 11"  (1.803 m)   Wt 168 lb 12.8 oz (76.6 kg)   SpO2 98%   BMI 23.54 kg/m   Outpatient Encounter Medications as of 02/06/2023  Medication Sig   acetaminophen (TYLENOL) 325 MG tablet Take 650 mg by mouth every 6 (six) hours as needed.   Amino Acids-Protein Hydrolys (PRO-STAT AWC) LIQD Take 30 mLs by mouth in the morning and at bedtime.   aspirin EC 81 MG tablet Take 162 mg by mouth daily. Swallow whole.   atorvastatin (LIPITOR) 40 MG tablet Take 40 mg by mouth daily.   calcium-vitamin D (OSCAL WITH D) 500-5 MG-MCG tablet Take 1 tablet by mouth daily.   levothyroxine (SYNTHROID) 75 MCG tablet Take 75 mcg by mouth daily before breakfast.   memantine (NAMENDA XR) 28 MG CP24 24 hr capsule Take 28 mg by mouth daily. 9 am   NON FORMULARY Diet:Regular Diet with Thin Liquids   omeprazole (PRILOSEC OTC) 20 MG tablet Take 20 mg by mouth at bedtime.   senna-docusate (SENOKOT-S) 8.6-50 MG tablet Take 2 tablets by mouth 2 (two) times daily as needed. hold for diarrhea   sertraline (ZOLOFT) 25 MG tablet  Take 25 mg by mouth daily.   Throat Lozenges (LOZENGES MT) Use as directed in the mouth or throat.  1; oral,Every Shift Special Instructions: sore throat   No facility-administered encounter medications on file as of 02/06/2023.     SIGNIFICANT DIAGNOSTIC EXAMS  PREVIOUS   02-09-22: dex scan: t score -2.863  NO NEW EXAMS.    LABS REVIEWED: PREVIOUS   03-17-22: tsh 1.804 04-07-22: wbc 6.6; hgb 12.0; hct 35.8; mcv 96.2plt 201; glucose 92; bun 31; creat 1.29; k+ 4.3; na++ 140; ca 8.4; gfr 57; protein 5.6; albumin 2.8; chol 98; ldl 49; trig 67; hdl 36; urine micro-albumin <3.0 10-10-22: wbc 8.4; hgb 11.4; hct 35.2; mcv 95.1 plt 251; glucose 112; bun 40; creat 1.56; k+ 4.4; na++ 137; ca 8.2; gfr 45; protein 5.6 albumin 2.7 hgb A1c 6.2; chol 110; ldl 61; tri 98; hdl 29  TODAY  02-05-23: wbc 9.0 hgb 12.8; hct 38.5; mcv 95.5 plt 205; glucose 149; bun 42; creat 1.71; k+ 3.9; na++ 133; ca 8.3 gfr 40  d-dimer: 0.45 CRP 3.6 02-06-23: wbc 6.9; hgb 11.8; hct 35.7; mcv 94.1 plt 185; glucose 121; bun 38; creat 1.41; k+ 3.7; na++ 134; ca 8.1; gfr 51; protein 5.9 albumin 2.9      Review of Systems  Constitutional:  Negative for malaise/fatigue.  Respiratory:  Negative for cough and shortness of breath.   Cardiovascular:  Negative for chest pain, palpitations and leg swelling.  Gastrointestinal:  Negative for abdominal pain, constipation and heartburn.  Musculoskeletal:  Negative for back pain, joint pain and myalgias.  Skin: Negative.   Neurological:  Negative for dizziness.  Psychiatric/Behavioral:  The patient is not nervous/anxious.    Physical Exam Constitutional:      General: He is not in acute distress.    Appearance: He is well-developed. He is not diaphoretic.  Neck:     Thyroid: No thyromegaly.  Cardiovascular:     Rate and Rhythm: Normal rate and regular rhythm.     Pulses: Normal pulses.     Heart sounds: Normal heart sounds.  Pulmonary:     Effort: Pulmonary effort is normal. No respiratory distress.     Breath sounds: Normal breath sounds.  Abdominal:     General: Bowel sounds are normal. There is no distension.     Palpations: Abdomen is soft.     Tenderness: There is no abdominal tenderness.  Musculoskeletal:        General: Normal range of motion.     Cervical back: Neck supple.     Right lower leg: No edema.     Left lower leg: No edema.  Lymphadenopathy:     Cervical: No cervical adenopathy.  Skin:    General: Skin is warm and dry.  Neurological:     Mental Status: He is alert. Mental status is at baseline.  Psychiatric:        Mood and Affect: Mood normal.     ASSESSMENT/ PLAN:  TODAY  COVID 19 with comorbid diabetes mellitus : will begin molnupiravir 800 mg twice daily for 5 days will get chest x-ray   2. Moderate protein calorie malnutrition: albumin 2.9 will increase prosource to 30 mL three times daily will monitor    Jason Innocent  NP [facility]   call [phone]

## 2023-03-01 ENCOUNTER — Non-Acute Institutional Stay (SKILLED_NURSING_FACILITY): Payer: Medicare Other | Admitting: Adult Health

## 2023-03-01 ENCOUNTER — Encounter: Payer: Self-pay | Admitting: Adult Health

## 2023-03-01 DIAGNOSIS — F01C0 Vascular dementia, severe, without behavioral disturbance, psychotic disturbance, mood disturbance, and anxiety: Secondary | ICD-10-CM | POA: Diagnosis not present

## 2023-03-01 DIAGNOSIS — E1122 Type 2 diabetes mellitus with diabetic chronic kidney disease: Secondary | ICD-10-CM

## 2023-03-01 DIAGNOSIS — I69359 Hemiplegia and hemiparesis following cerebral infarction affecting unspecified side: Secondary | ICD-10-CM | POA: Diagnosis not present

## 2023-03-01 DIAGNOSIS — N183 Chronic kidney disease, stage 3 unspecified: Secondary | ICD-10-CM | POA: Diagnosis not present

## 2023-03-01 NOTE — Progress Notes (Unsigned)
Location:  [facility] Room Number: 111 W Place of Service:  SNF ([id number])   CODE STATUS: DNR  No Known Allergies  Chief Complaint  Patient presents with   Medical Management of Chronic Issues    Routine visit. Discuss need for flu vaccine and covid boosters     HPI:    Past Medical History:  Diagnosis Date   Hypertension    Benign Essential   Hypothyroidism    Obesity    Osteoarthritis    PVD (peripheral vascular disease) (HCC)    Type 2 diabetes mellitus with vascular complications     Past Surgical History:  Procedure Laterality Date   APPENDECTOMY     TONSILLECTOMY      Social History   Socioeconomic History   Marital status: Single    Spouse name: Not on file   Number of children: Not on file   Years of education: 12   Highest education level: Not on file  Occupational History   Occupation: RETIRED  Tobacco Use   Smoking status: Former   Smokeless tobacco: 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 Used  Substance and Sexual Activity   Alcohol use: Not Currently   Drug use: Never   Sexual activity: Not Currently  Other Topics Concern   Not on file  Social History Narrative   Not on file   Social Determinants of Health   Financial Resource Strain: Not on file  Food Insecurity: Not on file  Transportation Needs: Not on file  Physical Activity: Not on file  Stress: Not on file  Social Connections: Not on file  Intimate Partner Violence: Not on file   Family History  Problem Relation Age of Onset   Osteoporosis Mother    Hypertension Mother    Cancer Mother    Heart attack Father    Heart attack Maternal Grandmother    Pneumonia Maternal Grandfather    Heart disease Paternal Grandmother    Heart disease Paternal Grandfather       VITAL SIGNS BP 121/77   Pulse 80   Ht 5\' 11"  (1.803 m)   Wt 167 lb (75.8 kg)   SpO2 96%   BMI 23.29 kg/m   Outpatient Encounter Medications as of 03/01/2023  Medication Sig    ARIPiprazole (ABILIFY) 2 MG tablet Take 2 mg by mouth daily.   aspirin EC 81 MG tablet Take 162 mg by mouth daily. Swallow whole.   atorvastatin (LIPITOR) 40 MG tablet Take 40 mg by mouth daily.   calcium-vitamin D (OSCAL WITH D) 500-5 MG-MCG tablet Take 1 tablet by mouth daily.   levothyroxine (SYNTHROID) 75 MCG tablet Take 75 mcg by mouth daily before breakfast.   memantine (NAMENDA XR) 28 MG CP24 24 hr capsule Take 28 mg by mouth daily. 9 am   NON FORMULARY Diet:Regular Diet with Thin Liquids   Nutritional Supplements (,FEEDING SUPPLEMENT, PROSOURCE PLUS) liquid Take 30 mLs by mouth 3 (three) times daily between meals. 9 am, 2 pm, and 9 pm   omeprazole (PRILOSEC OTC) 20 MG tablet Take 20 mg by mouth at bedtime.   Polyvinyl Alcohol-Povidone (ARTIFICIAL TEARS) 5-6 MG/ML SOLN Apply 1 drop to eye every hour as needed. Right eye   sertraline (ZOLOFT) 50 MG tablet Take 50 mg by mouth daily.   triamcinolone cream (KENALOG) 0.1 % Apply 1 Application topically 2 (two) times daily as needed.   [DISCONTINUED] acetaminophen (TYLENOL) 325 MG tablet Take 650 mg by mouth every  6 (six) hours as needed.   [DISCONTINUED] Amino Acids-Protein Hydrolys (PRO-STAT AWC) LIQD Take 30 mLs by mouth in the morning and at bedtime.   [DISCONTINUED] senna-docusate (SENOKOT-S) 8.6-50 MG tablet Take 2 tablets by mouth 2 (two) times daily as needed. hold for diarrhea   [DISCONTINUED] sertraline (ZOLOFT) 25 MG tablet Take 25 mg by mouth daily.   [DISCONTINUED] Throat Lozenges (LOZENGES MT) Use as directed in the mouth or throat.  1; oral,Every Shift Special Instructions: sore throat   No facility-administered encounter medications on file as of 03/01/2023.     SIGNIFICANT DIAGNOSTIC EXAMS       ASSESSMENT/ PLAN:     Synthia Innocent NP [facility]  Contact [phone] Monday through Friday 8am- 5pm  After hours call [phone]

## 2023-03-17 ENCOUNTER — Encounter (HOSPITAL_COMMUNITY)
Admission: RE | Admit: 2023-03-17 | Discharge: 2023-03-17 | Disposition: A | Discharge status: Home / Self Care | Payer: Medicare Other | Source: Skilled Nursing Facility | Attending: Adult Health | Admitting: Adult Health

## 2023-03-17 DIAGNOSIS — E039 Hypothyroidism, unspecified: Secondary | ICD-10-CM | POA: Insufficient documentation

## 2023-03-17 LAB — TSH: TSH: 2.641 u[IU]/mL (ref 0.350–4.500)

## 2023-03-30 ENCOUNTER — Encounter: Payer: Self-pay | Admitting: Adult Health

## 2023-03-30 ENCOUNTER — Non-Acute Institutional Stay (SKILLED_NURSING_FACILITY): Payer: Medicare Other | Admitting: Adult Health

## 2023-03-30 DIAGNOSIS — E1149 Type 2 diabetes mellitus with other diabetic neurological complication: Secondary | ICD-10-CM

## 2023-03-30 DIAGNOSIS — E785 Hyperlipidemia, unspecified: Secondary | ICD-10-CM | POA: Diagnosis not present

## 2023-03-30 DIAGNOSIS — E440 Moderate protein-calorie malnutrition: Secondary | ICD-10-CM

## 2023-03-30 DIAGNOSIS — E1169 Type 2 diabetes mellitus with other specified complication: Secondary | ICD-10-CM | POA: Diagnosis not present

## 2023-03-30 NOTE — Progress Notes (Signed)
Location:  [facility] Room Number: [phone] Place of Service:  SNF (31)   CODE STATUS: DNR  No Known Allergies  Chief Complaint  Patient presents with   Medical Management of Chronic Issues          Mild protein calorie malnutrition:     Hyperlipidemia associated with type 2 diabetes mellitus  Type 2 diabetes mellitus with neurological manifestations:     HPI:  He is a 77 year old long term resident of this facility being seen for the management of his chronic illnesses:  Mild protein calorie malnutrition:     Hyperlipidemia associated with type 2 diabetes mellitus  Type 2 diabetes mellitus with neurological manifestations. There are no reports of uncontrolled pain. His weight is without significant change.   Past Medical History:  Diagnosis Date   Hypertension    Benign Essential   Hypothyroidism    Obesity    Osteoarthritis    PVD (peripheral vascular disease) (HCC)    Type 2 diabetes mellitus with vascular complications     Past Surgical History:  Procedure Laterality Date   APPENDECTOMY     TONSILLECTOMY      Social History   Socioeconomic History   Marital status: Single    Spouse name: Not on file   Number of children: Not on file   Years of education: 12   Highest education level: Not on file  Occupational History   Occupation: RETIRED  Tobacco Use   Smoking status: Former   Smokeless tobacco: 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 Used  Substance and Sexual Activity   Alcohol use: Not Currently   Drug use: Never   Sexual activity: Not Currently  Other Topics Concern   Not on file  Social History Narrative   Not on file   Social Determinants of Health   Financial Resource Strain: Not on file  Food Insecurity: Not on file  Transportation Needs: Not on file  Physical Activity: Not on file  Stress: Not on file  Social Connections: Not on file  Intimate Partner Violence: Not on file   Family History  Problem Relation  Age of Onset   Osteoporosis Mother    Hypertension Mother    Cancer Mother    Heart attack Father    Heart attack Maternal Grandmother    Pneumonia Maternal Grandfather    Heart disease Paternal Grandmother    Heart disease Paternal Grandfather       VITAL SIGNS BP 110/74   Pulse 70   Temp 97.9 F (36.6 C)   Resp 18   Ht 5\' 11"  (1.803 m)   Wt 167 lb 8 oz (76 kg)   SpO2 96%   BMI 23.36 kg/m   Outpatient Encounter Medications as of 03/30/2023  Medication Sig   ARIPiprazole (ABILIFY) 2 MG tablet Take 2 mg by mouth daily.   aspirin EC 81 MG tablet Take 162 mg by mouth daily. Swallow whole.   atorvastatin (LIPITOR) 40 MG tablet Take 40 mg by mouth daily.   calcium-vitamin D (OSCAL WITH D) 500-5 MG-MCG tablet Take 1 tablet by mouth daily.   levothyroxine (SYNTHROID) 75 MCG tablet Take 75 mcg by mouth daily before breakfast.   memantine (NAMENDA XR) 28 MG CP24 24 hr capsule Take 28 mg by mouth daily. 9 am   NON FORMULARY Diet:Regular Diet with Thin Liquids   Nutritional Supplements (,FEEDING SUPPLEMENT, PROSOURCE PLUS) liquid Take 30 mLs by mouth 3 (three) times  daily between meals. 9 am, 2 pm, and 9 pm   omeprazole (PRILOSEC OTC) 20 MG tablet Take 20 mg by mouth at bedtime.   Polyvinyl Alcohol-Povidone (ARTIFICIAL TEARS) 5-6 MG/ML SOLN Apply 1 drop to eye every hour as needed. Right eye   sertraline (ZOLOFT) 50 MG tablet Take 50 mg by mouth daily.   triamcinolone cream (KENALOG) 0.1 % Apply 1 Application topically 2 (two) times daily as needed.   No facility-administered encounter medications on file as of 03/30/2023.     SIGNIFICANT DIAGNOSTIC EXAMS  PREVIOUS   02-09-22: dex scan: t score -2.863  NO NEW EXAMS.    LABS REVIEWED: PREVIOUS   04-07-22: wbc 6.6; hgb 12.0; hct 35.8; mcv 96.2plt 201; glucose 92; bun 31; creat 1.29; k+ 4.3; na++ 140; ca 8.4; gfr 57; protein 5.6; albumin 2.8; chol 98; ldl 49; trig 67; hdl 36; urine micro-albumin <3.0 10-10-22: wbc 8.4; hgb 11.4;  hct 35.2; mcv 95.1 plt 251; glucose 112; bun 40; creat 1.56; k+ 4.4; na++ 137; ca 8.2; gfr 45; protein 5.6 albumin 2.7 hgb A1c 6.2; chol 110; ldl 61; tri 98; hdl 29 02-05-23: wbc 9.0; hgb 12.8; hct 38.5; mcv 95.5 plt 205; glucose 149; bun 42; creat 1.71; k+ 3.9; na++ 133; ca 8.3 gfr 40; d-dimer 0.45 CRP 3.6 02-06-23: wbc 6.9; hgb 11.8; hct 35.7; mcv 94.2 plt 185; glucose 121; bun 38; creat 1.41; k+ 3.7; na++ 134; ca 8.1; gfr 51; protein 5.9 albumin 2.9 tsh 1.528  NO NEW LABS.     Review of Systems  Constitutional:  Negative for malaise/fatigue.  Respiratory:  Negative for cough and shortness of breath.   Cardiovascular:  Negative for chest pain, palpitations and leg swelling.  Gastrointestinal:  Negative for abdominal pain, constipation and heartburn.  Musculoskeletal:  Negative for back pain, joint pain and myalgias.  Skin: Negative.   Neurological:  Negative for dizziness.  Psychiatric/Behavioral:  The patient is not nervous/anxious.    Physical Exam Constitutional:      General: He is not in acute distress.    Appearance: He is well-developed. He is not diaphoretic.  Neck:     Thyroid: No thyromegaly.  Cardiovascular:     Rate and Rhythm: Normal rate and regular rhythm.     Pulses: Normal pulses.     Heart sounds: Normal heart sounds.  Pulmonary:     Effort: Pulmonary effort is normal. No respiratory distress.     Breath sounds: Normal breath sounds.  Abdominal:     General: Bowel sounds are normal. There is no distension.     Palpations: Abdomen is soft.     Tenderness: There is no abdominal tenderness.  Musculoskeletal:        General: Normal range of motion.     Cervical back: Neck supple.     Right lower leg: No edema.     Left lower leg: No edema.  Lymphadenopathy:     Cervical: No cervical adenopathy.  Skin:    General: Skin is warm and dry.  Neurological:     Mental Status: He is alert. Mental status is at baseline.     Comments: 11-16-22: SLUMS 15/30  Psychiatric:         Mood and Affect: Mood normal.                    ASSESSMENT/ PLAN:  TODAY  Mild protein calorie malnutrition: albumin 2.7 will continue supplements as directed  2. Hyperlipidemia associated with type 2 diabetes  mellitus; ldl 61 will continue lipitor 40 mg daily  3. Type 2 diabetes mellitus with neurological manifestations: hgb A1c 6.2 on asa/statin   PREVIOUS   4. Osteoporosis: t score -2.863 will monitor   5. Hypertension associated with type 2 diabetes mellitus: b/p 121/77 will monitor   6. Chronic constipation: will continue senna s 2 tabs twice daily as needed   7. GERD without esophagitis: will continue prilosec 20 mg daily   8. History of CVA: will continue asa 162 mg daily is off plavix  9. Thyroid disease: tsh 1.528; will continue synthroid 75 mcg daily  10. Chronic depression: will continue zoloft 50 mg daily    11. Hemiplegia and hemiparesis following cerebral infarction unspecified site: is on asa 162 mg daily   12. Vascular dementia without behavioral disturbance, psychotic disturbance, mood disturbance, anxiety: weight is 167 pounds will continue namenda xr 28 mg daily   13. CKD stage 3 associated with type 2 diabetes mellitus: bun 38; creat 1.41; gfr 51     Synthia Innocent NP [facility]  call [phone]

## 2023-05-03 ENCOUNTER — Non-Acute Institutional Stay (SKILLED_NURSING_FACILITY): Payer: Medicare Other | Admitting: Internal Medicine

## 2023-05-03 ENCOUNTER — Encounter: Payer: Self-pay | Admitting: Internal Medicine

## 2023-05-03 DIAGNOSIS — I69359 Hemiplegia and hemiparesis following cerebral infarction affecting unspecified side: Secondary | ICD-10-CM | POA: Diagnosis not present

## 2023-05-03 DIAGNOSIS — E039 Hypothyroidism, unspecified: Secondary | ICD-10-CM | POA: Diagnosis not present

## 2023-05-03 DIAGNOSIS — N183 Chronic kidney disease, stage 3 unspecified: Secondary | ICD-10-CM

## 2023-05-03 DIAGNOSIS — H3530 Unspecified macular degeneration: Secondary | ICD-10-CM

## 2023-05-03 DIAGNOSIS — E1122 Type 2 diabetes mellitus with diabetic chronic kidney disease: Secondary | ICD-10-CM | POA: Diagnosis not present

## 2023-05-03 DIAGNOSIS — I700 Atherosclerosis of aorta: Secondary | ICD-10-CM

## 2023-05-03 DIAGNOSIS — E440 Moderate protein-calorie malnutrition: Secondary | ICD-10-CM

## 2023-05-03 NOTE — Assessment & Plan Note (Signed)
He denies any anginal symptoms at this time.

## 2023-05-03 NOTE — Assessment & Plan Note (Signed)
TSH is current & therapeutic. No change indicated.

## 2023-05-03 NOTE — Assessment & Plan Note (Signed)
On exam today there was no significant asymmetry in strength to opposition.  He does validate dysphagia if he is not careful when he eats.

## 2023-05-03 NOTE — Assessment & Plan Note (Signed)
Although albumin and total protein have improved they still remain in the protein/caloric malnutrition range.  Nutritionist continues to monitor at [facility].

## 2023-05-03 NOTE — Assessment & Plan Note (Signed)
Ophthalmology follow-up is to be January 9 according to him; but she could not name his ophthalmologist.  He also confabulated and I have diagnosed with macular degeneration and made the ophthalmology referral.  Any appointment will be verified.

## 2023-05-03 NOTE — Progress Notes (Unsigned)
NURSING HOME LOCATION:  [facility] ROOM NUMBER:  [id number] W  CODE STATUS:  DNR  PCP:  Synthia Innocent NP  This is a nursing facility follow up visit of chronic medical diagnoses & to document compliance with Regulation 483.30 (c) in The Long Term Care Survey Manual Phase 2 which mandates caregiver visit ( visits can alternate among physician, PA or NP as per statutes) within 10 days of 30 days / 60 days/ 90 days post admission to SNF date    Interim medical record and care since last SNF visit was updated with review of diagnostic studies and change in clinical status since last visit were documented.  HPI: He is a permanent resident of this facility with medical diagnoses of hypothyroidism, dyslipidemia, GERD, essential hypertension, history of stroke, degenerative joint disease, and diabetes with vascular complications. Most recent labs revealed mild hyponatremia, serially stable.  CKD had improved with a creatinine of 1.41 and GFR 51 indicating stage IIIa.  Albumin and total protein had improved with values of 2.9 and 5.9 respectively but still documented protein/caloric malnutrition.  There has been slight progression of his normochromic, normocytic anemia with H/H of 11.8/35.7.  TSH was therapeutic at 2.641. Although he has a history of diabetes with vascular complications; his most recent A1c was prediabetic at 6.2% on 10/10/2022.  He is on no diabetic medications.  Review of systems: His main complaint that he stays cold.  He then said that his temperature stays at "97.1."  I did inform him that his TSH was therapeutic.  He has dysphagia "if I am not careful when I eat."  He does have active psoriasis with lesions over the abdomen.  He confabulated that I had diagnosed his macular degeneration.  He states that he has a follow-up ophthalmologic visit on January 9.  He could not name his ophthalmologist.  Constitutional: No fever, significant weight change, fatigue  Eyes: No  redness, discharge, pain, vision change ENT/mouth: No nasal congestion,  purulent discharge, earache, change in hearing, sore throat  Cardiovascular: No chest pain, palpitations, paroxysmal nocturnal dyspnea, claudication, edema  Respiratory: No cough, sputum production, hemoptysis, DOE, significant snoring, apnea   Gastrointestinal: No heartburn, dysphagia, abdominal pain, nausea /vomiting, rectal bleeding, melena, change in bowels Genitourinary: No dysuria, hematuria, pyuria, incontinence, nocturia Musculoskeletal: No joint stiffness, joint swelling, weakness, pain Dermatologic: No rash, pruritus, change in appearance of skin Neurologic: No dizziness, headache, syncope, seizures, numbness, tingling Psychiatric: No significant anxiety, depression, insomnia, anorexia Endocrine: No change in hair/skin/nails, excessive thirst, excessive hunger, excessive urination  Hematologic/lymphatic: No significant bruising, lymphadenopathy, abnormal bleeding Allergy/immunology: No itchy/watery eyes, significant sneezing, urticaria, angioedema  Physical exam:  Pertinent or positive findings: He was sitting in the wheelchair in the darkened room.  He was interactive and very pleasant.  Facies tended to be blank.  He has bilateral ptosis.  Heart sounds are markedly distant.  Abdomen is slightly protuberant.  Pedal pulses are not palpable.  He does have limb atrophy and interosseous wasting.  There were marked deformities of the fingers especially distally.  He has large psoriatic patches over the abdomen. General appearance: Adequately nourished; no acute distress, increased work of breathing is present.   Lymphatic: No lymphadenopathy about the head, neck, axilla. Eyes: No conjunctival inflammation or lid edema is present. There is no scleral icterus. Ears:  External ear exam shows no significant lesions or deformities.   Nose:  External nasal examination shows no deformity or inflammation. Nasal mucosa are pink  and moist without lesions, exudates Oral exam:  Lips and gums are healthy appearing. There is no oropharyngeal erythema or exudate. Neck:  No thyromegaly, masses, tenderness noted.    Heart:  Normal rate and regular rhythm. S1 and S2 normal without gallop, murmur, click, rub .  Lungs: Chest clear to auscultation without wheezes, rhonchi, rales, rubs. Abdomen: Bowel sounds are normal. Abdomen is soft and nontender with no organomegaly, hernias, masses. GU: Deferred  Extremities:  No cyanosis, clubbing, edema  Neurologic exam : Cn 2-7 intact Strength equal  in upper & lower extremities Balance, Rhomberg, finger to nose testing could not be completed due to clinical state Deep tendon reflexes are equal Skin: Warm & dry w/o tenting. No significant lesions or rash.  See summary under each active problem in the Problem List with associated updated therapeutic plan

## 2023-05-03 NOTE — Patient Instructions (Signed)
See assessment and plan under each diagnosis in the problem list and acutely for this visit 

## 2023-05-03 NOTE — Assessment & Plan Note (Addendum)
CKD has improved slightly with current creatinine of 1.41 and GFR 51 indicating stage IIIa. Most recent A1c was prediabetic at 6.2% on 10/10/2022.  Update indicated.

## 2023-05-23 ENCOUNTER — Encounter: Payer: Self-pay | Admitting: Adult Health

## 2023-05-23 NOTE — Progress Notes (Unsigned)
Location:  [facility] Room Number: 111 Place of Service:  SNF (31)   CODE STATUS: dnr   No Known Allergies  Chief Complaint  Patient presents with   Acute Visit    Care plan meeting     HPI:  We have come together for his care plan meeting. BIMS 15/15 mood 3/30: not eating due to not liking food. He uses wheelchair without falls. He requires moderate to dependent assist for his adl care. He is incontinent of bladder and bowel. Dietary: regular diet; appetite 51-100%; requires setup for meals; weight is 177.6 pounds. Therapy none at this time. He continues to be followed for his chronic illnesses including:   Aortic atherosclerosis   Severe vascular dementia without behavioral disturbance, psychotic disturbance mood disturbance or anxiety  Moderate protein calorie malnutrition  Past Medical History:  Diagnosis Date   Hypertension    Benign Essential   Hypothyroidism    Obesity    Osteoarthritis    PVD (peripheral vascular disease) (HCC)    Type 2 diabetes mellitus with vascular complications     Past Surgical History:  Procedure Laterality Date   APPENDECTOMY     TONSILLECTOMY      Social History   Socioeconomic History   Marital status: Single    Spouse name: Not on file   Number of children: Not on file   Years of education: 12   Highest education level: Not on file  Occupational History   Occupation: RETIRED  Tobacco Use   Smoking status: Former   Smokeless tobacco: 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 Used  Substance and Sexual Activity   Alcohol use: Not Currently   Drug use: Never   Sexual activity: Not Currently  Other Topics Concern   Not on file  Social History Narrative   Not on file   Social Determinants of Health   Financial Resource Strain: Not on file  Food Insecurity: Not on file  Transportation Needs: Not on file  Physical Activity: Not on file  Stress: Not on file  Social Connections: Not on file   Intimate Partner Violence: Not on file   Family History  Problem Relation Age of Onset   Osteoporosis Mother    Hypertension Mother    Cancer Mother    Heart attack Father    Heart attack Maternal Grandmother    Pneumonia Maternal Grandfather    Heart disease Paternal Grandmother    Heart disease Paternal Grandfather       VITAL SIGNS BP 110/69   Pulse 88   Temp (!) 97.4 F (36.3 C)   Resp 20   Ht 5\' 11"  (1.803 m)   Wt 177 lb 9.6 oz (80.6 kg)   SpO2 97%   BMI 24.77 kg/m   Outpatient Encounter Medications as of 05/24/2023  Medication Sig   ARIPiprazole (ABILIFY) 2 MG tablet Take 2 mg by mouth daily.   aspirin EC 81 MG tablet Take 162 mg by mouth daily. Swallow whole.   atorvastatin (LIPITOR) 40 MG tablet Take 40 mg by mouth daily.   calcium-vitamin D (OSCAL WITH D) 500-5 MG-MCG tablet Take 1 tablet by mouth daily.   levothyroxine (SYNTHROID) 75 MCG tablet Take 75 mcg by mouth daily before breakfast.   memantine (NAMENDA XR) 28 MG CP24 24 hr capsule Take 28 mg by mouth daily. 9 am   NON FORMULARY Diet:Regular Diet with Thin Liquids   Nutritional Supplements (,FEEDING SUPPLEMENT, PROSOURCE PLUS) liquid  Take 30 mLs by mouth 3 (three) times daily between meals. 9 am, 2 pm, and 9 pm   omeprazole (PRILOSEC OTC) 20 MG tablet Take 20 mg by mouth at bedtime.   Polyvinyl Alcohol-Povidone (ARTIFICIAL TEARS) 5-6 MG/ML SOLN Apply 1 drop to eye every hour as needed. Right eye   sertraline (ZOLOFT) 50 MG tablet Take 50 mg by mouth daily.   triamcinolone cream (KENALOG) 0.1 % Apply 1 Application topically 2 (two) times daily as needed.   No facility-administered encounter medications on file as of 05/24/2023.     SIGNIFICANT DIAGNOSTIC EXAMS  PREVIOUS   02-09-22: dex scan: t score -2.863  NO NEW EXAMS.    LABS REVIEWED: PREVIOUS   10-10-22: wbc 8.4; hgb 11.4; hct 35.2; mcv 95.1 plt 251; glucose 112; bun 40; creat 1.56; k+ 4.4; na++ 137; ca 8.2; gfr 45; protein 5.6 albumin 2.7  hgb A1c 6.2; chol 110; ldl 61; tri 98; hdl 29 02-05-23: wbc 9.0; hgb 12.8; hct 38.5; mcv 95.5 plt 205; glucose 149; bun 42; creat 1.71; k+ 3.9; na++ 133; ca 8.3 gfr 40; d-dimer 0.45 CRP 3.6 02-06-23: wbc 6.9; hgb 11.8; hct 35.7; mcv 94.2 plt 185; glucose 121; bun 38; creat 1.41; k+ 3.7; na++ 134; ca 8.1; gfr 51; protein 5.9 albumin 2.9 tsh 1.528  NO NEW LABS.     Review of Systems  Constitutional:  Negative for malaise/fatigue.  Respiratory:  Negative for cough and shortness of breath.   Cardiovascular:  Negative for chest pain, palpitations and leg swelling.  Gastrointestinal:  Negative for abdominal pain, constipation and heartburn.  Musculoskeletal:  Negative for back pain, joint pain and myalgias.  Skin: Negative.   Neurological:  Negative for dizziness.  Psychiatric/Behavioral:  The patient is not nervous/anxious.    Physical Exam Constitutional:      General: He is not in acute distress.    Appearance: He is well-developed. He is not diaphoretic.  Neck:     Thyroid: No thyromegaly.  Cardiovascular:     Rate and Rhythm: Normal rate and regular rhythm.     Pulses: Normal pulses.     Heart sounds: Normal heart sounds.  Pulmonary:     Effort: Pulmonary effort is normal. No respiratory distress.     Breath sounds: Normal breath sounds.  Abdominal:     General: Bowel sounds are normal. There is no distension.     Palpations: Abdomen is soft.     Tenderness: There is no abdominal tenderness.  Musculoskeletal:        General: Normal range of motion.     Cervical back: Neck supple.     Right lower leg: No edema.     Left lower leg: No edema.  Lymphadenopathy:     Cervical: No cervical adenopathy.  Skin:    General: Skin is warm and dry.  Neurological:     Mental Status: He is alert. Mental status is at baseline.     Comments: 11-16-22: SLUMS 15/30  Psychiatric:        Mood and Affect: Mood normal.      ASSESSMENT/ PLAN:  TODAY  Aortic atherosclerosis Severe vascular  dementia without behavioral disturbance, psychotic disturbance mood disturbance or anxiety Moderate protein calorie malnutrition  Will continue current medications Will continue current plan of care Will continue to monitor his status.   Time spent with patient: 40 minutes: medications plan of care; dietary.    Synthia Innocent NP [facility]  call [phone]

## 2023-05-24 ENCOUNTER — Non-Acute Institutional Stay (SKILLED_NURSING_FACILITY): Payer: Self-pay | Admitting: Adult Health

## 2023-05-24 DIAGNOSIS — E440 Moderate protein-calorie malnutrition: Secondary | ICD-10-CM | POA: Diagnosis not present

## 2023-05-24 DIAGNOSIS — F01C0 Vascular dementia, severe, without behavioral disturbance, psychotic disturbance, mood disturbance, and anxiety: Secondary | ICD-10-CM

## 2023-05-24 DIAGNOSIS — I700 Atherosclerosis of aorta: Secondary | ICD-10-CM | POA: Diagnosis not present

## 2023-06-01 ENCOUNTER — Encounter: Payer: Self-pay | Admitting: Adult Health

## 2023-06-01 ENCOUNTER — Non-Acute Institutional Stay (SKILLED_NURSING_FACILITY): Payer: Medicare Other | Admitting: Adult Health

## 2023-06-01 DIAGNOSIS — E1159 Type 2 diabetes mellitus with other circulatory complications: Secondary | ICD-10-CM | POA: Diagnosis not present

## 2023-06-01 DIAGNOSIS — M810 Age-related osteoporosis without current pathological fracture: Secondary | ICD-10-CM

## 2023-06-01 DIAGNOSIS — I152 Hypertension secondary to endocrine disorders: Secondary | ICD-10-CM

## 2023-06-01 DIAGNOSIS — K5909 Other constipation: Secondary | ICD-10-CM | POA: Diagnosis not present

## 2023-06-01 NOTE — Progress Notes (Signed)
Location:  [facility] Room Number: 111 Place of Service:  SNF (31)   CODE STATUS: dnr   No Known Allergies  Chief Complaint  Patient presents with   Medical Management of Chronic Issues         Osteoporosis:    Hypertension associated with type 2 diabetes mellitus:    Chronic constipation:      HPI:  He is a 77 year old long term resident of this facility being seen for the management of his chronic illnesses: Osteoporosis:    Hypertension associated with type 2 diabetes mellitus:    Chronic constipation. There are no reports of uncontrolled pain; no reports of constipation. His weight is without significant change.   Past Medical History:  Diagnosis Date   Hypertension    Benign Essential   Hypothyroidism    Obesity    Osteoarthritis    PVD (peripheral vascular disease) (HCC)    Type 2 diabetes mellitus with vascular complications     Past Surgical History:  Procedure Laterality Date   APPENDECTOMY     TONSILLECTOMY      Social History   Socioeconomic History   Marital status: Single    Spouse name: Not on file   Number of children: Not on file   Years of education: 12   Highest education level: Not on file  Occupational History   Occupation: RETIRED  Tobacco Use   Smoking status: Former   Smokeless tobacco: 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 Used  Substance and Sexual Activity   Alcohol use: Not Currently   Drug use: Never   Sexual activity: Not Currently  Other Topics Concern   Not on file  Social History Narrative   Not on file   Social Determinants of Health   Financial Resource Strain: Not on file  Food Insecurity: Not on file  Transportation Needs: Not on file  Physical Activity: Not on file  Stress: Not on file  Social Connections: Not on file  Intimate Partner Violence: Not on file   Family History  Problem Relation Age of Onset   Osteoporosis Mother    Hypertension Mother    Cancer Mother    Heart  attack Father    Heart attack Maternal Grandmother    Pneumonia Maternal Grandfather    Heart disease Paternal Grandmother    Heart disease Paternal Grandfather       VITAL SIGNS BP 101/67   Pulse 64   Temp 97.6 F (36.4 C)   Resp 16   Ht 5\' 11"  (1.803 m)   Wt 178 lb 9.6 oz (81 kg)   SpO2 98%   BMI 24.91 kg/m   Outpatient Encounter Medications as of 06/01/2023  Medication Sig   ARIPiprazole (ABILIFY) 2 MG tablet Take 2 mg by mouth daily.   aspirin EC 81 MG tablet Take 162 mg by mouth daily. Swallow whole.   atorvastatin (LIPITOR) 40 MG tablet Take 40 mg by mouth daily.   calcium-vitamin D (OSCAL WITH D) 500-5 MG-MCG tablet Take 1 tablet by mouth daily.   levothyroxine (SYNTHROID) 75 MCG tablet Take 75 mcg by mouth daily before breakfast.   memantine (NAMENDA XR) 28 MG CP24 24 hr capsule Take 28 mg by mouth daily. 9 am   NON FORMULARY Diet:Regular Diet with Thin Liquids   Nutritional Supplements (,FEEDING SUPPLEMENT, PROSOURCE PLUS) liquid Take 30 mLs by mouth 3 (three) times daily between meals. 9 am, 2 pm, and 9  pm   omeprazole (PRILOSEC OTC) 20 MG tablet Take 20 mg by mouth at bedtime.   Polyvinyl Alcohol-Povidone (ARTIFICIAL TEARS) 5-6 MG/ML SOLN Apply 1 drop to eye every hour as needed. Right eye   sertraline (ZOLOFT) 50 MG tablet Take 50 mg by mouth daily.   triamcinolone cream (KENALOG) 0.1 % Apply 1 Application topically 2 (two) times daily as needed.   No facility-administered encounter medications on file as of 06/01/2023.     SIGNIFICANT DIAGNOSTIC EXAMS  PREVIOUS   02-09-22: dex scan: t score -2.863  NO NEW EXAMS.    LABS REVIEWED: PREVIOUS   10-10-22: wbc 8.4; hgb 11.4; hct 35.2; mcv 95.1 plt 251; glucose 112; bun 40; creat 1.56; k+ 4.4; na++ 137; ca 8.2; gfr 45; protein 5.6 albumin 2.7 hgb A1c 6.2; chol 110; ldl 61; tri 98; hdl 29 02-05-23: wbc 9.0; hgb 12.8; hct 38.5; mcv 95.5 plt 205; glucose 149; bun 42; creat 1.71; k+ 3.9; na++ 133; ca 8.3 gfr 40;  d-dimer 0.45 CRP 3.6 02-06-23: wbc 6.9; hgb 11.8; hct 35.7; mcv 94.2 plt 185; glucose 121; bun 38; creat 1.41; k+ 3.7; na++ 134; ca 8.1; gfr 51; protein 5.9 albumin 2.9 tsh 1.528  TODAY  03-17-23: tsh 2.641    Review of Systems  Unable to perform ROS: Dementia   Physical Exam Constitutional:      General: He is not in acute distress.    Appearance: He is well-developed. He is not diaphoretic.  Neck:     Thyroid: No thyromegaly.  Cardiovascular:     Rate and Rhythm: Normal rate and regular rhythm.     Pulses: Normal pulses.     Heart sounds: Normal heart sounds.  Pulmonary:     Effort: Pulmonary effort is normal. No respiratory distress.     Breath sounds: Normal breath sounds.  Abdominal:     General: Bowel sounds are normal. There is no distension.     Palpations: Abdomen is soft.     Tenderness: There is no abdominal tenderness.  Musculoskeletal:        General: Normal range of motion.     Cervical back: Neck supple.     Right lower leg: No edema.     Left lower leg: No edema.  Lymphadenopathy:     Cervical: No cervical adenopathy.  Skin:    General: Skin is warm and dry.  Neurological:     Mental Status: He is alert. Mental status is at baseline.     Comments: 11-16-22: SLUMS 15/30   Psychiatric:        Mood and Affect: Mood normal.                     ASSESSMENT/ PLAN:  TODAY  Osteoporosis: t score: -2.863   2. Hypertension associated with type 2 diabetes mellitus: b/p 101/67 will monitor   3. Chronic constipation: will continue senna s 2 tabs twice daily as needed  PREVIOUS   4. GERD without esophagitis: will continue prilosec 20 mg daily   5. History of CVA: will continue asa 162 mg daily is off plavix  6. Thyroid disease: tsh 2.641; will continue synthroid 75 mcg daily  7. Chronic depression: will continue zoloft 50 mg daily    8. Hemiplegia and hemiparesis following cerebral infarction unspecified site: is on asa 162 mg daily   9. Vascular  dementia without behavioral disturbance, psychotic disturbance, mood disturbance, anxiety: weight is 178 pounds will continue namenda xr 28 mg daily  10. CKD stage 3 associated with type 2 diabetes mellitus: bun 38; creat 1.41; gfr 51   11. Mild protein calorie malnutrition: albumin 2.7 will continue supplements as directed  12. Hyperlipidemia associated with type 2 diabetes mellitus; ldl 61 will continue lipitor 40 mg daily  13. Type 2 diabetes mellitus with neurological manifestations: hgb A1c 6.2 on asa/statin     Synthia Innocent NP [facility]   call [phone]

## 2023-06-23 ENCOUNTER — Other Ambulatory Visit (HOSPITAL_COMMUNITY)
Admission: RE | Admit: 2023-06-23 | Discharge: 2023-06-23 | Disposition: A | Discharge status: Home / Self Care | Payer: Medicare Other | Source: Skilled Nursing Facility | Attending: Adult Health | Admitting: Adult Health

## 2023-06-23 DIAGNOSIS — E1149 Type 2 diabetes mellitus with other diabetic neurological complication: Secondary | ICD-10-CM | POA: Diagnosis present

## 2023-06-23 LAB — BASIC METABOLIC PANEL
Anion gap: 6 (ref 5–15)
BUN: 45 mg/dL — ABNORMAL HIGH (ref 8–23)
CO2: 23 mmol/L (ref 22–32)
Calcium: 8.4 mg/dL — ABNORMAL LOW (ref 8.9–10.3)
Chloride: 107 mmol/L (ref 98–111)
Creatinine, Ser: 1.52 mg/dL — ABNORMAL HIGH (ref 0.61–1.24)
GFR, Estimated: 46 mL/min — ABNORMAL LOW (ref >60)
Glucose, Bld: 129 mg/dL — ABNORMAL HIGH (ref 70–99)
Potassium: 3.9 mmol/L (ref 3.5–5.1)
Sodium: 136 mmol/L (ref 135–145)

## 2023-07-10 ENCOUNTER — Encounter: Payer: Self-pay | Admitting: Adult Health

## 2023-07-10 ENCOUNTER — Non-Acute Institutional Stay (SKILLED_NURSING_FACILITY): Payer: Self-pay | Admitting: Adult Health

## 2023-07-10 DIAGNOSIS — K219 Gastro-esophageal reflux disease without esophagitis: Secondary | ICD-10-CM

## 2023-07-10 DIAGNOSIS — Z8673 Personal history of transient ischemic attack (TIA), and cerebral infarction without residual deficits: Secondary | ICD-10-CM

## 2023-07-10 DIAGNOSIS — E039 Hypothyroidism, unspecified: Secondary | ICD-10-CM | POA: Diagnosis not present

## 2023-07-10 NOTE — Progress Notes (Signed)
 Location:  [facility] Room Number: 114 Place of Service:  SNF (31)   CODE STATUS: dnr   No Known Allergies  Chief Complaint  Patient presents with   Medical Management of Chronic Issues         GERD without esophagitis:     History of CVA:    Thyroid  disease     HPI:  He is a 78 year old long term resident of this facility being seen for the management of her chronic illnesses: GERD without esophagitis:     History of CVA:    Thyroid  disease. There are no reports of uncontrolled pain. There are no reports of anxiety or agitation present.   Past Medical History:  Diagnosis Date   Hypertension    Benign Essential   Hypothyroidism    Obesity    Osteoarthritis    PVD (peripheral vascular disease) (HCC)    Type 2 diabetes mellitus with vascular complications     Past Surgical History:  Procedure Laterality Date   APPENDECTOMY     TONSILLECTOMY      Social History   Socioeconomic History   Marital status: Single    Spouse name: Not on file   Number of children: Not on file   Years of education: 12   Highest education level: Not on file  Occupational History   Occupation: RETIRED  Tobacco Use   Smoking status: Former   Smokeless tobacco: Never  Advertising Account Planner   Vaping status: Never Used  Substance and Sexual Activity   Alcohol use: Not Currently   Drug use: Never   Sexual activity: Not Currently  Other Topics Concern   Not on file  Social History Narrative   Not on file   Social Drivers of Health   Financial Resource Strain: Not on file  Food Insecurity: Not on file  Transportation Needs: Not on file  Physical Activity: Not on file  Stress: Not on file  Social Connections: Not on file  Intimate Partner Violence: Not on file   Family History  Problem Relation Age of Onset   Osteoporosis Mother    Hypertension Mother    Cancer Mother    Heart attack Father    Heart attack Maternal Grandmother    Pneumonia Maternal Grandfather     Heart disease Paternal Grandmother    Heart disease Paternal Grandfather       VITAL SIGNS BP 129/88   Pulse 68   Temp 98.4 F (36.9 C)   Resp 20   Ht 6' (1.829 m)   Wt 179 lb (81.2 kg)   SpO2 98%   BMI 24.28 kg/m   Outpatient Encounter Medications as of 07/10/2023  Medication Sig   ARIPiprazole (ABILIFY) 2 MG tablet Take 2 mg by mouth daily.   aspirin  EC 81 MG tablet Take 162 mg by mouth daily. Swallow whole.   atorvastatin  (LIPITOR) 40 MG tablet Take 40 mg by mouth daily.   calcium -vitamin D (OSCAL WITH D) 500-5 MG-MCG tablet Take 1 tablet by mouth daily.   levothyroxine  (SYNTHROID ) 75 MCG tablet Take 75 mcg by mouth daily before breakfast.   memantine (NAMENDA XR) 28 MG CP24 24 hr capsule Take 28 mg by mouth daily. 9 am   NON FORMULARY Diet:Regular Diet with Thin Liquids   Nutritional Supplements (,FEEDING SUPPLEMENT, PROSOURCE PLUS) liquid Take 30 mLs by mouth 3 (three) times daily between meals. 9 am, 2 pm, and 9 pm   omeprazole (PRILOSEC OTC) 20  MG tablet Take 20 mg by mouth at bedtime.   Polyvinyl Alcohol-Povidone (ARTIFICIAL TEARS) 5-6 MG/ML SOLN Apply 1 drop to eye every hour as needed. Right eye   sertraline (ZOLOFT) 50 MG tablet Take 50 mg by mouth daily.   triamcinolone cream (KENALOG) 0.1 % Apply 1 Application topically 2 (two) times daily as needed.   No facility-administered encounter medications on file as of 07/10/2023.     SIGNIFICANT DIAGNOSTIC EXAMS  PREVIOUS   02-09-22: dex scan: t score -2.863  NO NEW EXAMS.    LABS REVIEWED: PREVIOUS   10-10-22: wbc 8.4; hgb 11.4; hct 35.2; mcv 95.1 plt 251; glucose 112; bun 40; creat 1.56; k+ 4.4; na++ 137; ca 8.2; gfr 45; protein 5.6 albumin 2.7 hgb A1c 6.2; chol 110; ldl 61; tri 98; hdl 29 02-05-23: wbc 9.0; hgb 12.8; hct 38.5; mcv 95.5 plt 205; glucose 149; bun 42; creat 1.71; k+ 3.9; na++ 133; ca 8.3 gfr 40; d-dimer 0.45 CRP 3.6 02-06-23: wbc 6.9; hgb 11.8; hct 35.7; mcv 94.2 plt 185; glucose 121; bun 38;  creat 1.41; k+ 3.7; na++ 134; ca 8.1; gfr 51; protein 5.9 albumin 2.9 tsh 1.528  TODAY  03-17-23: tsh 2.641 06-23-23: glucose 129; bun 45; creat 1.52; k+ 3.9; na++ 136; ca 8.4; gfr 46     Review of Systems  Unable to perform ROS: Dementia   Physical Exam Constitutional:      General: He is not in acute distress.    Appearance: He is well-developed. He is not diaphoretic.  Neck:     Thyroid : No thyromegaly.  Cardiovascular:     Rate and Rhythm: Normal rate and regular rhythm.     Pulses: Normal pulses.     Heart sounds: Normal heart sounds.  Pulmonary:     Effort: Pulmonary effort is normal. No respiratory distress.     Breath sounds: Normal breath sounds.  Abdominal:     General: Bowel sounds are normal. There is no distension.     Palpations: Abdomen is soft.     Tenderness: There is no abdominal tenderness.  Musculoskeletal:        General: Normal range of motion.     Cervical back: Neck supple.  Lymphadenopathy:     Cervical: No cervical adenopathy.  Skin:    General: Skin is warm and dry.  Neurological:     Mental Status: He is alert. Mental status is at baseline.     Comments:  11-16-22: SLUMS 15/30    Psychiatric:        Mood and Affect: Mood normal.                      ASSESSMENT/ PLAN:  TODAY  GERD without esophagitis: will continue prilosec 20 mg daily   2. History of CVA: is on asa 162 mg daily is off plavix   3. Thyroid  disease: tsh 2.641 will continue synthroid  75 mcg daily   PREVIOUS   4. Chronic depression: will continue zoloft 50 mg daily    5. Hemiplegia and hemiparesis following cerebral infarction unspecified site: is on asa 162 mg daily   6. Vascular dementia without behavioral disturbance, psychotic disturbance, mood disturbance, anxiety: weight is 179 pounds will continue namenda xr 28 mg daily   7. CKD stage 3 associated with type 2 diabetes mellitus: bun 45; creat 1.52; gfr 46   8. Mild protein calorie malnutrition: albumin 2.7 will  continue supplements as directed  9. Hyperlipidemia associated with type 2 diabetes  mellitus; ldl 61 will continue lipitor 40 mg daily  10. Type 2 diabetes mellitus with neurological manifestations: hgb A1c 6.2 on asa/statin   11. Osteoporosis: t score: -2.863   12. Hypertension associated with type 2 diabetes mellitus: b/p 129/88 will monitor   13. Chronic constipation: will continue senna s 2 tabs twice daily as needed   Barnie Seip NP [facility]  call [phone]

## 2023-07-13 ENCOUNTER — Other Ambulatory Visit (HOSPITAL_COMMUNITY)
Admission: RE | Admit: 2023-07-13 | Discharge: 2023-07-13 | Disposition: A | Discharge status: Home / Self Care | Payer: Medicare Other | Source: Skilled Nursing Facility | Attending: Adult Health | Admitting: Adult Health

## 2023-07-13 DIAGNOSIS — E083393 Diabetes mellitus due to underlying condition with moderate nonproliferative diabetic retinopathy without macular edema, bilateral: Secondary | ICD-10-CM | POA: Insufficient documentation

## 2023-07-13 DIAGNOSIS — E119 Type 2 diabetes mellitus without complications: Secondary | ICD-10-CM | POA: Diagnosis present

## 2023-07-13 LAB — HEMOGLOBIN A1C
Hgb A1c MFr Bld: 6.8 % — ABNORMAL HIGH (ref 4.8–5.6)
Mean Plasma Glucose: 148.46 mg/dL

## 2023-07-14 LAB — MICROALBUMIN / CREATININE URINE RATIO
Creatinine, Urine: 49.3 mg/dL
Microalb Creat Ratio: 350 mg/g{creat} — ABNORMAL HIGH (ref 0–29)
Microalb, Ur: 172.6 ug/mL — ABNORMAL HIGH

## 2023-07-25 ENCOUNTER — Non-Acute Institutional Stay (SKILLED_NURSING_FACILITY): Payer: Medicare Other | Admitting: Internal Medicine

## 2023-07-25 ENCOUNTER — Encounter: Payer: Self-pay | Admitting: Internal Medicine

## 2023-07-25 DIAGNOSIS — J069 Acute upper respiratory infection, unspecified: Secondary | ICD-10-CM | POA: Diagnosis not present

## 2023-07-25 DIAGNOSIS — D649 Anemia, unspecified: Secondary | ICD-10-CM | POA: Diagnosis not present

## 2023-07-25 DIAGNOSIS — F01C0 Vascular dementia, severe, without behavioral disturbance, psychotic disturbance, mood disturbance, and anxiety: Secondary | ICD-10-CM

## 2023-07-25 DIAGNOSIS — N183 Chronic kidney disease, stage 3 unspecified: Secondary | ICD-10-CM

## 2023-07-25 DIAGNOSIS — E1122 Type 2 diabetes mellitus with diabetic chronic kidney disease: Secondary | ICD-10-CM | POA: Diagnosis not present

## 2023-07-25 DIAGNOSIS — E039 Hypothyroidism, unspecified: Secondary | ICD-10-CM

## 2023-07-25 DIAGNOSIS — Z8673 Personal history of transient ischemic attack (TIA), and cerebral infarction without residual deficits: Secondary | ICD-10-CM

## 2023-07-25 NOTE — Patient Instructions (Signed)
See assessment and plan under each diagnosis in the problem list and acutely for this visit

## 2023-07-25 NOTE — Assessment & Plan Note (Signed)
Today he expressed concerns about having another stroke.  I discussed the risk factors with him.  He obviously does not smoke here at the facility; A1c control is excellent; LDL was at goal at 61 on 10/10/2022; on 40 mg of atorvastatin daily and blood pressure is adequately controlled.  Labs will be updated as per routine.

## 2023-07-25 NOTE — Assessment & Plan Note (Signed)
Most recent H/H was 11.8/35.7 on 02/06/2023.  This was slightly lower than values of 12.8/38.5.  No bleeding dyscrasias reported.  Update CBC at next labs.

## 2023-07-25 NOTE — Assessment & Plan Note (Signed)
Today he was interactive and appropriate without evidence of severe neurocognitive deficit.  Affect was somewhat flat and he expressed concerns about having another stroke.

## 2023-07-25 NOTE — Assessment & Plan Note (Signed)
Current TSH is therapeutic on L-thyroxine 75 mcg daily.  Serially it has been therapeutic.  This can be monitored every 6 months.

## 2023-07-25 NOTE — Progress Notes (Unsigned)
NURSING HOME LOCATION:  [facility] ROOM NUMBER:  111 W  CODE STATUS:  DNR  PCP:  Synthia Innocent NP  This is a nursing facility follow up visit of chronic medical diagnoses & to document compliance with Regulation 483.30 (c) in The [employer] Term [employer] Survey Manual Phase 2 which mandates caregiver visit ( visits can alternate among physician, PA or NP as per statutes) within 10 days of 30 days / 60 days/ 90 days post admission to SNF date    Interim medical record and care since last SNF visit was updated with review of diagnostic studies and change in clinical status since last visit were documented.  HPI: He is a permanent resident of this facility with medical diagnoses of hypothyroidism, diabetes with vascular complications, essential hypertension, PMH of CVA,peripheral vascular disease, and neurocognitive deficits.  On 07/13/2023 A1c was 6.8%, indicating excellent control.  Prior A1c had been 6.2% on 07/11/2022.  He is on no diabetic medications; but he is on Abilify which may be impacting glucose control.  Despite this control there has increase in urine microalbumin to 272.6.  The microalbumin/creatinine ratio was 350 with normals of 0-29.  At this time renal function is relatively stable with current creatinine of 1.52 and GFR 46 indicating CKD low stage IIIa. On 02/05/2023 creatinine had been 1.71 and GFR 40. On 02/06/2023 H/H was 11.8/35.7, down from prior values of 12.8/38.5.  Indices are normochromic, normocytic. Serially his TSH has been therapeutic with most recent value 2.641 on 03/17/2023.  He is on L-thyroxine 75 mcg daily.  Review of systems: He states that he has been having a stuffy nose for couple weeks and a "cold" for 7 days.  This is associated with a slight sore throat, nasal congestion, postnasal drainage.  There are no symptoms of rhinosinusitis or purulent bronchitis.  Multiple COVID screenings including today  have been negative. He does describe occasional  dysphagia since he had a stroke.  He expresses concerns about having another stroke Staff states that he stays in his room and has no complaints.  Constitutional: No fever, significant weight change, fatigue  Eyes: No redness, discharge, pain, vision change ENT/mouth: No purulent discharge, earache, change in hearing  Cardiovascular: No chest pain, palpitations, paroxysmal nocturnal dyspnea, claudication, edema  Respiratory: No cough, sputum production, hemoptysis, DOE, significant snoring, apnea   Gastrointestinal: No heartburn, abdominal pain, nausea /vomiting, rectal bleeding, melena, change in bowels Genitourinary: No dysuria, hematuria, pyuria, incontinence, nocturia Musculoskeletal: No joint stiffness, joint swelling, weakness, pain Dermatologic: No rash, pruritus, change in appearance of skin Neurologic: No dizziness, headache, syncope, seizures, numbness, tingling Psychiatric: No significant anxiety, depression, insomnia, anorexia Endocrine: No change in hair/skin/nails, excessive thirst, excessive hunger, excessive urination  Hematologic/lymphatic: No significant bruising, lymphadenopathy, abnormal bleeding Allergy/immunology: No itchy/watery eyes, significant sneezing, urticaria, angioedema  Physical exam:  Pertinent or positive findings: He was asleep in his wheelchair exhibiting hypopnea without frank apnea or snoring.  He did rouse easily.  He is interactive and was most appropriate.  There was no significant signs of neurocognitive deficit.  Affect tends to be flat and facies are blank.  He has bilateral ptosis, greater on the right.  There is slight nasal congestion without purulence.  Teeth were coated.  Heart sounds are markedly distant and difficult to auscultate.  He has trace-1/2+ edema at the sock line.  Pedal pulses are decreased, especially posterior tibial pulses.  He has significant arthritic changes in the hands involving the PIP and DIP  joints.  There are some  contracture of the fingers as well.  Limb atrophy is present, especially in the lower extremities with strength to opposition  fairly good and equal.  General appearance: no acute distress, increased work of breathing is present.   Lymphatic: No lymphadenopathy about the head, neck, axilla. Eyes: No conjunctival inflammation or lid edema is present. There is no scleral icterus. Ears:  External ear exam shows no significant lesions or deformities.   Nose:  External nasal examination shows no deformity or inflammation. Nasal mucosa are pink and moist without lesions, exudates Heart:  No gallop, murmur, click, rub .  Lungs: Chest clear to auscultation without wheezes, rhonchi, rales, rubs. Abdomen: Bowel sounds are normal. Abdomen is soft and nontender with no organomegaly, hernias, masses. GU: Deferred  Extremities:  No cyanosis, clubbing  Neurologic exam :Balance, Rhomberg, finger to nose testing could not be completed due to clinical state Skin: Warm & dry w/o tenting.  See summary under each active problem in the Problem List with associated updated therapeutic plan

## 2023-07-25 NOTE — Assessment & Plan Note (Signed)
A1c has risen from 6.2% on 07/11/22 to 6.8% on 07/13/2023.  This still indicates excellent control without diabetic medications.  This is in the context of Abilify which could impact glucose control; AI states that this is less likely to impact diabetes than other atypical antipsychotic medications.   Current creatinine is 1.52 with GFR 46 indicating low stage IIIa CKD.  On 02/05/2023 creatinine had been 1.71 and GFR 40. Continue to monitor; No change in present medications or dosages indicated .

## 2023-07-26 ENCOUNTER — Other Ambulatory Visit (HOSPITAL_COMMUNITY)
Admission: RE | Admit: 2023-07-26 | Discharge: 2023-07-26 | Disposition: A | Discharge status: Home / Self Care | Payer: Medicare Other | Source: Skilled Nursing Facility | Attending: Adult Health | Admitting: Adult Health

## 2023-07-26 DIAGNOSIS — D649 Anemia, unspecified: Secondary | ICD-10-CM | POA: Insufficient documentation

## 2023-07-26 LAB — CBC WITH DIFFERENTIAL/PLATELET
Abs Immature Granulocytes: 0.02 10*3/uL (ref 0.00–0.07)
Basophils Absolute: 0.1 10*3/uL (ref 0.0–0.1)
Basophils Relative: 1 %
Eosinophils Absolute: 0.2 10*3/uL (ref 0.0–0.5)
Eosinophils Relative: 3 %
HCT: 35.6 % — ABNORMAL LOW (ref 39.0–52.0)
Hemoglobin: 11.5 g/dL — ABNORMAL LOW (ref 13.0–17.0)
Immature Granulocytes: 0 %
Lymphocytes Relative: 32 %
Lymphs Abs: 2.4 10*3/uL (ref 0.7–4.0)
MCH: 31.3 pg (ref 26.0–34.0)
MCHC: 32.3 g/dL (ref 30.0–36.0)
MCV: 97 fL (ref 80.0–100.0)
Monocytes Absolute: 1 10*3/uL (ref 0.1–1.0)
Monocytes Relative: 13 %
Neutro Abs: 3.8 10*3/uL (ref 1.7–7.7)
Neutrophils Relative %: 51 %
Platelets: 221 10*3/uL (ref 150–400)
RBC: 3.67 MIL/uL — ABNORMAL LOW (ref 4.22–5.81)
RDW: 12.6 % (ref 11.5–15.5)
WBC: 7.5 10*3/uL (ref 4.0–10.5)
nRBC: 0 % (ref 0.0–0.2)

## 2023-08-18 ENCOUNTER — Non-Acute Institutional Stay (SKILLED_NURSING_FACILITY): Payer: Medicare Other | Admitting: Adult Health

## 2023-08-18 ENCOUNTER — Encounter: Payer: Self-pay | Admitting: Adult Health

## 2023-08-18 DIAGNOSIS — E1159 Type 2 diabetes mellitus with other circulatory complications: Secondary | ICD-10-CM

## 2023-08-18 DIAGNOSIS — I700 Atherosclerosis of aorta: Secondary | ICD-10-CM

## 2023-08-18 DIAGNOSIS — E440 Moderate protein-calorie malnutrition: Secondary | ICD-10-CM

## 2023-08-18 DIAGNOSIS — I152 Hypertension secondary to endocrine disorders: Secondary | ICD-10-CM | POA: Diagnosis not present

## 2023-08-18 NOTE — Progress Notes (Signed)
Location:  [facility] Room Number: 110 Place of Service:  SNF (31)   CODE STATUS: dnr  No Known Allergies  Chief Complaint  Patient presents with   Acute Visit    Care plan meeting     HPI:  We have come together for her care plan meeting. BIMS 15/15 mood 2/30: decreased energy, some depression. He uses wheelchair without falls. He requires dependent assist with his adl care. He is incontinent of bladder and bowel. Dietary: setup for meals; regular diet appetite 76-100% weight is 179 pounds. Therapy: none at this time. He continues to be followed for his chronic illnesses including: Hypertension associated with type 2 diabetes mellitus  Aortic atherosclerosis  Moderate protein calorie malnutrition  Past Medical History:  Diagnosis Date   Hypertension    Benign Essential   Hypothyroidism    Obesity    Osteoarthritis    PVD (peripheral vascular disease) (HCC)    Type 2 diabetes mellitus with vascular complications     Past Surgical History:  Procedure Laterality Date   APPENDECTOMY     TONSILLECTOMY      Social History   Socioeconomic History   Marital status: Single    Spouse name: Not on file   Number of children: Not on file   Years of education: 12   Highest education level: Not on file  Occupational History   Occupation: RETIRED  Tobacco Use   Smoking status: Former   Smokeless tobacco: 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 Used  Substance and Sexual Activity   Alcohol use: Not Currently   Drug use: Never   Sexual activity: Not Currently  Other Topics Concern   Not on file  Social History Narrative   Not on file   Social Drivers of Health   Financial Resource Strain: Not on file  Food Insecurity: Not on file  Transportation Needs: Not on file  Physical Activity: Not on file  Stress: Not on file  Social Connections: Not on file  Intimate Partner Violence: Not on file   Family History  Problem Relation Age of Onset    Osteoporosis Mother    Hypertension Mother    Cancer Mother    Heart attack Father    Heart attack Maternal Grandmother    Pneumonia Maternal Grandfather    Heart disease Paternal Grandmother    Heart disease Paternal Grandfather       VITAL SIGNS BP 110/68   Pulse 74   Temp 97.6 F (36.4 C)   Resp 20   Ht 6' (1.829 m)   Wt 179 lb 9.6 oz (81.5 kg)   SpO2 97%   BMI 24.36 kg/m   Outpatient Encounter Medications as of 08/18/2023  Medication Sig   ARIPiprazole (ABILIFY) 2 MG tablet Take 2 mg by mouth daily.   aspirin EC 81 MG tablet Take 162 mg by mouth daily. Swallow whole.   atorvastatin (LIPITOR) 40 MG tablet Take 40 mg by mouth daily.   calcium-vitamin D (OSCAL WITH D) 500-5 MG-MCG tablet Take 1 tablet by mouth daily.   levothyroxine (SYNTHROID) 75 MCG tablet Take 75 mcg by mouth daily before breakfast.   memantine (NAMENDA XR) 28 MG CP24 24 hr capsule Take 28 mg by mouth daily. 9 am   NON FORMULARY Diet:Regular Diet with Thin Liquids   Nutritional Supplements (,FEEDING SUPPLEMENT, PROSOURCE PLUS) liquid Take 30 mLs by mouth 3 (three) times daily between meals. 9 am, 2 pm, and 9  pm   omeprazole (PRILOSEC OTC) 20 MG tablet Take 20 mg by mouth at bedtime.   Polyvinyl Alcohol-Povidone (ARTIFICIAL TEARS) 5-6 MG/ML SOLN Apply 1 drop to eye every hour as needed. Right eye   sertraline (ZOLOFT) 50 MG tablet Take 50 mg by mouth daily.   triamcinolone cream (KENALOG) 0.1 % Apply 1 Application topically 2 (two) times daily as needed.   No facility-administered encounter medications on file as of 08/18/2023.     SIGNIFICANT DIAGNOSTIC EXAMS  PREVIOUS   02-09-22: dex scan: t score -2.863  NO NEW EXAMS.    LABS REVIEWED: PREVIOUS   10-10-22: wbc 8.4; hgb 11.4; hct 35.2; mcv 95.1 plt 251; glucose 112; bun 40; creat 1.56; k+ 4.4; na++ 137; ca 8.2; gfr 45; protein 5.6 albumin 2.7 hgb A1c 6.2; chol 110; ldl 61; tri 98; hdl 29 02-05-23: wbc 9.0; hgb 12.8; hct 38.5; mcv 95.5 plt  205; glucose 149; bun 42; creat 1.71; k+ 3.9; na++ 133; ca 8.3 gfr 40; d-dimer 0.45 CRP 3.6 02-06-23: wbc 6.9; hgb 11.8; hct 35.7; mcv 94.2 plt 185; glucose 121; bun 38; creat 1.41; k+ 3.7; na++ 134; ca 8.1; gfr 51; protein 5.9 albumin 2.9 tsh 1.528  TODAY  03-17-23: tsh 2.641 06-23-23: glucose 129; bun 45; creat 1.52; k+ 3.9; na++ 136; ca 8.4; gfr 46     Review of Systems  Constitutional:  Negative for malaise/fatigue.  Respiratory:  Negative for cough and shortness of breath.   Cardiovascular:  Negative for chest pain, palpitations and leg swelling.  Gastrointestinal:  Negative for abdominal pain, constipation and heartburn.  Musculoskeletal:  Negative for back pain, joint pain and myalgias.  Skin: Negative.   Neurological:  Negative for dizziness.  Psychiatric/Behavioral:  The patient is not nervous/anxious.    Physical Exam Constitutional:      General: He is not in acute distress.    Appearance: He is well-developed. He is not diaphoretic.  Neck:     Thyroid: No thyromegaly.  Cardiovascular:     Rate and Rhythm: Normal rate and regular rhythm.     Heart sounds: Normal heart sounds.  Pulmonary:     Effort: Pulmonary effort is normal. No respiratory distress.     Breath sounds: Normal breath sounds.  Abdominal:     General: Bowel sounds are normal. There is no distension.     Palpations: Abdomen is soft.     Tenderness: There is no abdominal tenderness.  Musculoskeletal:        General: Normal range of motion.     Cervical back: Neck supple.     Right lower leg: No edema.     Left lower leg: No edema.  Lymphadenopathy:     Cervical: No cervical adenopathy.  Skin:    General: Skin is warm and dry.  Neurological:     Mental Status: He is alert. Mental status is at baseline.     Comments: SLUMS 15/30     Psychiatric:        Mood and Affect: Mood normal.      ASSESSMENT/ PLAN:  TODAY  Hypertension associated with type 2 diabetes mellitus Aortic  atherosclerosis Moderate protein calorie malnutrition  Will continue current medications Will continue current plan of care Will continue to monitor his status.   Time spent with patient: 40 minutes: medications; dietary; plan of care    Synthia Innocent NP [facility]   call [phone]

## 2023-09-05 ENCOUNTER — Non-Acute Institutional Stay (SKILLED_NURSING_FACILITY): Payer: Self-pay | Admitting: Adult Health

## 2023-09-05 ENCOUNTER — Encounter: Payer: Self-pay | Admitting: Adult Health

## 2023-09-05 DIAGNOSIS — I69359 Hemiplegia and hemiparesis following cerebral infarction affecting unspecified side: Secondary | ICD-10-CM | POA: Diagnosis not present

## 2023-09-05 DIAGNOSIS — F01C0 Vascular dementia, severe, without behavioral disturbance, psychotic disturbance, mood disturbance, and anxiety: Secondary | ICD-10-CM | POA: Diagnosis not present

## 2023-09-05 DIAGNOSIS — F32A Depression, unspecified: Secondary | ICD-10-CM | POA: Diagnosis not present

## 2023-09-05 NOTE — Progress Notes (Unsigned)
 Location:  [facility] Room Number: 110 Place of Service:  SNF (31)   CODE STATUS: dnr   No Known Allergies  Chief Complaint  Patient presents with   Medical Management of Chronic Issues     Chronic depression: Hemiplegia and hemiparesis following cerebral infarction unspecified site:   Vascular dementia without behavioral disturbance; psychotic disturbance; mood disturbance, anxiety     HPI:  He is a 78 year old long term resident of this facility being seen for the management of his chronic illnesses: Chronic depression: Hemiplegia and hemiparesis following cerebral infarction unspecified site:   Vascular dementia without behavioral disturbance; psychotic disturbance; mood disturbance, anxiety.  There are no reports of uncontrolled pain. His weight is stable; no reports of anxiety or depressive thoughts   Past Medical History:  Diagnosis Date   Hypertension    Benign Essential   Hypothyroidism    Obesity    Osteoarthritis    PVD (peripheral vascular disease) (HCC)    Type 2 diabetes mellitus with vascular complications     Past Surgical History:  Procedure Laterality Date   APPENDECTOMY     TONSILLECTOMY      Social History   Socioeconomic History   Marital status: Single    Spouse name: Not on file   Number of children: Not on file   Years of education: 12   Highest education level: Not on file  Occupational History   Occupation: RETIRED  Tobacco Use   Smoking status: Former   Smokeless tobacco: 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 Used  Substance and Sexual Activity   Alcohol use: Not Currently   Drug use: Never   Sexual activity: Not Currently  Other Topics Concern   Not on file  Social History Narrative   Not on file   Social Drivers of Health   Financial Resource Strain: Not on file  Food Insecurity: Not on file  Transportation Needs: Not on file  Physical Activity: Not on file  Stress: Not on file  Social  Connections: Not on file  Intimate Partner Violence: Not on file   Family History  Problem Relation Age of Onset   Osteoporosis Mother    Hypertension Mother    Cancer Mother    Heart attack Father    Heart attack Maternal Grandmother    Pneumonia Maternal Grandfather    Heart disease Paternal Grandmother    Heart disease Paternal Grandfather       VITAL SIGNS BP 112/70   Pulse 68   Temp 98.2 F (36.8 C)   Resp 20   Ht 6' (1.829 m)   Wt 181 lb (82.1 kg)   SpO2 96%   BMI 24.55 kg/m   Outpatient Encounter Medications as of 09/05/2023  Medication Sig   ARIPiprazole (ABILIFY) 2 MG tablet Take 2 mg by mouth daily.   aspirin EC 81 MG tablet Take 162 mg by mouth daily. Swallow whole.   atorvastatin (LIPITOR) 40 MG tablet Take 40 mg by mouth daily.   calcium-vitamin D (OSCAL WITH D) 500-5 MG-MCG tablet Take 1 tablet by mouth daily.   fluticasone (FLONASE) 50 MCG/ACT nasal spray Place 1 spray into both nostrils daily.   levothyroxine (SYNTHROID) 75 MCG tablet Take 75 mcg by mouth daily before breakfast.   memantine (NAMENDA XR) 28 MG CP24 24 hr capsule Take 28 mg by mouth daily. 9 am   NON FORMULARY Diet:Regular Diet with Thin Liquids   Nutritional Supplements (,FEEDING  SUPPLEMENT, PROSOURCE PLUS) liquid Take 30 mLs by mouth 3 (three) times daily between meals. 9 am, 2 pm, and 9 pm   omeprazole (PRILOSEC OTC) 20 MG tablet Take 20 mg by mouth at bedtime.   Polyvinyl Alcohol-Povidone (ARTIFICIAL TEARS) 5-6 MG/ML SOLN Apply 1 drop to eye every hour as needed. Right eye   sertraline (ZOLOFT) 50 MG tablet Take 50 mg by mouth daily.   triamcinolone cream (KENALOG) 0.1 % Apply 1 Application topically 2 (two) times daily as needed.   No facility-administered encounter medications on file as of 09/05/2023.     SIGNIFICANT DIAGNOSTIC EXAMS   LABS REVIEWED: PREVIOUS   10-10-22: wbc 8.4; hgb 11.4; hct 35.2; mcv 95.1 plt 251; glucose 112; bun 40; creat 1.56; k+ 4.4; na++ 137; ca 8.2; gfr  45; protein 5.6 albumin 2.7 hgb A1c 6.2; chol 110; ldl 61; tri 98; hdl 29 02-05-23: wbc 9.0; hgb 12.8; hct 38.5; mcv 95.5 plt 205; glucose 149; bun 42; creat 1.71; k+ 3.9; na++ 133; ca 8.3 gfr 40; d-dimer 0.45 CRP 3.6 02-06-23: wbc 6.9; hgb 11.8; hct 35.7; mcv 94.2 plt 185; glucose 121; bun 38; creat 1.41; k+ 3.7; na++ 134; ca 8.1; gfr 51; protein 5.9 albumin 2.9 tsh 1.528 03-17-23: tsh 2.641 06-23-23: glucose 129; bun 45; creat 1.52; k+ 3.9; na++ 136; ca 8.4; gfr 46     NO NEW LABS.   Review of Systems  Unable to perform ROS: Dementia   Physical Exam Constitutional:      General: He is not in acute distress.    Appearance: He is well-developed. He is not diaphoretic.  Neck:     Thyroid: No thyromegaly.  Cardiovascular:     Rate and Rhythm: Normal rate and regular rhythm.     Pulses: Normal pulses.     Heart sounds: Normal heart sounds.  Pulmonary:     Effort: Pulmonary effort is normal. No respiratory distress.     Breath sounds: Normal breath sounds.  Abdominal:     General: Bowel sounds are normal. There is no distension.     Palpations: Abdomen is soft.     Tenderness: There is no abdominal tenderness.  Musculoskeletal:        General: Normal range of motion.     Cervical back: Neck supple.     Right lower leg: No edema.     Left lower leg: No edema.  Lymphadenopathy:     Cervical: No cervical adenopathy.  Skin:    General: Skin is warm and dry.  Neurological:     Mental Status: He is alert. Mental status is at baseline.     Comments: SLUMS 15/30   Psychiatric:        Mood and Affect: Mood normal.                    ASSESSMENT/ PLAN:  TODAY  Chronic depression: is on zoloft 50 mg daily; is tolerating this dose without difficulty  2. Hemiplegia and hemiparesis following cerebral infarction unspecified site: in on asa 162 mg daily   3. Vascular dementia without behavioral disturbance; psychotic disturbance; mood disturbance, anxiety. Weight is 181 without  significant change will continue namenda 28 mg xr daily    PREVIOUS   4. CKD stage 3 associated with type 2 diabetes mellitus: bun 45; creat 1.52; gfr 46   5. Mild protein calorie malnutrition: albumin 2.7 will continue supplements as directed  6. Hyperlipidemia associated with type 2 diabetes mellitus; ldl 61 will  continue lipitor 40 mg daily  7. Type 2 diabetes mellitus with neurological manifestations: hgb A1c 6.2 on asa/statin   8. Osteoporosis: t score: -2.863   9. Hypertension associated with type 2 diabetes mellitus: b/p 129/88 will monitor   10. Chronic constipation: will continue senna s 2 tabs twice daily as needed  11. GERD without esophagitis: will continue prilosec 20 mg daily   12. History of CVA: is on asa 162 mg daily is off plavix  13. Thyroid disease: tsh 2.641 will continue synthroid 75 mcg daily     Synthia Innocent NP [facility]  call [phone]

## 2023-09-07 DIAGNOSIS — F32A Depression, unspecified: Secondary | ICD-10-CM | POA: Insufficient documentation

## 2023-09-08 ENCOUNTER — Other Ambulatory Visit (HOSPITAL_COMMUNITY)
Admission: RE | Admit: 2023-09-08 | Discharge: 2023-09-08 | Disposition: A | Discharge status: Home / Self Care | Source: Skilled Nursing Facility | Attending: Adult Health | Admitting: Adult Health

## 2023-09-08 DIAGNOSIS — E441 Mild protein-calorie malnutrition: Secondary | ICD-10-CM | POA: Insufficient documentation

## 2023-09-08 LAB — TSH: TSH: 3.137 u[IU]/mL (ref 0.350–4.500)

## 2023-09-08 LAB — T4, FREE: Free T4: 0.91 ng/dL (ref 0.61–1.12)

## 2023-09-09 LAB — T3, FREE: T3, Free: 2.1 pg/mL (ref 2.0–4.4)

## 2023-10-09 ENCOUNTER — Non-Acute Institutional Stay (SKILLED_NURSING_FACILITY): Payer: Self-pay | Admitting: Adult Health

## 2023-10-09 ENCOUNTER — Encounter: Payer: Self-pay | Admitting: Adult Health

## 2023-10-09 DIAGNOSIS — E1122 Type 2 diabetes mellitus with diabetic chronic kidney disease: Secondary | ICD-10-CM

## 2023-10-09 DIAGNOSIS — E785 Hyperlipidemia, unspecified: Secondary | ICD-10-CM

## 2023-10-09 DIAGNOSIS — N183 Chronic kidney disease, stage 3 unspecified: Secondary | ICD-10-CM

## 2023-10-09 DIAGNOSIS — E440 Moderate protein-calorie malnutrition: Secondary | ICD-10-CM | POA: Diagnosis not present

## 2023-10-09 DIAGNOSIS — E1169 Type 2 diabetes mellitus with other specified complication: Secondary | ICD-10-CM | POA: Diagnosis not present

## 2023-10-09 NOTE — Progress Notes (Unsigned)
 Location:  [facility] Room Number: [id number] Place of Service:  SNF (31)   CODE STATUS: dnr   No Known Allergies  Chief Complaint  Patient presents with   Medical Management of Chronic Issues         CKD stage 3 due to type 2 diabetes mellitus Moderate protein malnutrition Hyperlipidemia associated with type 2 diabetes mellitus     HPI:  He is a 78 y.o. long term resident of this facility being seen for the management of his chronic illnesses:CKD stage 3 due to type 2 diabetes mellitus Moderate protein malnutrition Hyperlipidemia associated with type 2 diabetes mellitus. There are no reports of uncontrolled pain. His weight remains stable. He continues to get out of bed daily    Past Medical History:  Diagnosis Date   Hypertension    Benign Essential   Hypothyroidism    Obesity    Osteoarthritis    PVD (peripheral vascular disease) (HCC)    Type 2 diabetes mellitus with vascular complications     Past Surgical History:  Procedure Laterality Date   APPENDECTOMY     TONSILLECTOMY      Social History   Socioeconomic History   Marital status: Single    Spouse name: Not on file   Number of children: Not on file   Years of education: 12   Highest education level: Not on file  Occupational History   Occupation: RETIRED  Tobacco Use   Smoking status: Former   Smokeless tobacco: 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 Used  Substance and Sexual Activity   Alcohol use: Not Currently   Drug use: Never   Sexual activity: Not Currently  Other Topics Concern   Not on file  Social History Narrative   Not on file   Social Drivers of Health   Financial Resource Strain: Not on file  Food Insecurity: Not on file  Transportation Needs: Not on file  Physical Activity: Not on file  Stress: Not on file  Social Connections: Not on file  Intimate Partner Violence: Not on file   Family History  Problem Relation Age of Onset   Osteoporosis Mother     Hypertension Mother    Cancer Mother    Heart attack Father    Heart attack Maternal Grandmother    Pneumonia Maternal Grandfather    Heart disease Paternal Grandmother    Heart disease Paternal Grandfather       VITAL SIGNS BP (!) 106/48   Pulse 70   Temp 97.7 F (36.5 C)   Resp 18   Ht 6' (1.829 m)   Wt 182 lb 6.4 oz (82.7 kg)   SpO2 98%   BMI 24.74 kg/m   Outpatient Encounter Medications as of 10/09/2023  Medication Sig   memantine (NAMENDA XR) 14 MG CP24 24 hr capsule Take 14 mg by mouth.   aspirin EC 81 MG tablet Take 162 mg by mouth daily. Swallow whole.   atorvastatin (LIPITOR) 40 MG tablet Take 40 mg by mouth daily.   calcium-vitamin D (OSCAL WITH D) 500-5 MG-MCG tablet Take 1 tablet by mouth daily.   fluticasone (FLONASE) 50 MCG/ACT nasal spray Place 1 spray into both nostrils daily.   levothyroxine (SYNTHROID) 75 MCG tablet Take 75 mcg by mouth daily before breakfast.   NON FORMULARY Diet:Regular Diet with Thin Liquids   Nutritional Supplements (,FEEDING SUPPLEMENT, PROSOURCE PLUS) liquid Take 30 mLs by mouth 3 (three) times daily between meals. 9 am,  2 pm, and 9 pm   omeprazole (PRILOSEC OTC) 20 MG tablet Take 20 mg by mouth at bedtime.   Polyvinyl Alcohol-Povidone (ARTIFICIAL TEARS) 5-6 MG/ML SOLN Apply 1 drop to eye every hour as needed. Right eye   sertraline (ZOLOFT) 50 MG tablet Take 50 mg by mouth daily.   triamcinolone cream (KENALOG) 0.1 % Apply 1 Application topically 2 (two) times daily as needed.   [DISCONTINUED] ARIPiprazole (ABILIFY) 2 MG tablet Take 2 mg by mouth daily.   [DISCONTINUED] memantine (NAMENDA XR) 28 MG CP24 24 hr capsule Take 28 mg by mouth daily. 9 am   No facility-administered encounter medications on file as of 10/09/2023.     SIGNIFICANT DIAGNOSTIC EXAMS  LABS REVIEWED: PREVIOUS   10-10-22: wbc 8.4; hgb 11.4; hct 35.2; mcv 95.1 plt 251; glucose 112; bun 40; creat 1.56; k+ 4.4; na++ 137; ca 8.2; gfr 45; protein 5.6 albumin 2.7 hgb  A1c 6.2; chol 110; ldl 61; tri 98; hdl 29 02-05-23: wbc 9.0; hgb 12.8; hct 38.5; mcv 95.5 plt 205; glucose 149; bun 42; creat 1.71; k+ 3.9; na++ 133; ca 8.3 gfr 40; d-dimer 0.45 CRP 3.6 02-06-23: wbc 6.9; hgb 11.8; hct 35.7; mcv 94.2 plt 185; glucose 121; bun 38; creat 1.41; k+ 3.7; na++ 134; ca 8.1; gfr 51; protein 5.9 albumin 2.9 tsh 1.528 03-17-23: tsh 2.641 06-23-23: glucose 129; bun 45; creat 1.52; k+ 3.9; na++ 136; ca 8.4; gfr 46     TODAY  07-13-23: hgb A1c 6.8 urine microalbumin 172.6 07-26-23: wbc 7.5; hgb 11.5; hct 35.6; mcv 97.0 plt 221 09-08-23: tsh 3.137; free t3: 2.1 free t4: 0.91  Review of Systems  Unable to perform ROS: Dementia   Physical Exam Constitutional:      General: He is not in acute distress.    Appearance: He is well-developed. He is not diaphoretic.  Neck:     Thyroid: No thyromegaly.  Cardiovascular:     Rate and Rhythm: Normal rate and regular rhythm.     Pulses: Normal pulses.     Heart sounds: Normal heart sounds.  Pulmonary:     Effort: Pulmonary effort is normal. No respiratory distress.     Breath sounds: Normal breath sounds.  Abdominal:     General: Bowel sounds are normal. There is no distension.     Palpations: Abdomen is soft.     Tenderness: There is no abdominal tenderness.  Musculoskeletal:        General: Normal range of motion.     Cervical back: Neck supple.     Right lower leg: No edema.     Left lower leg: No edema.  Lymphadenopathy:     Cervical: No cervical adenopathy.  Skin:    General: Skin is warm and dry.  Neurological:     Mental Status: He is alert. Mental status is at baseline.     Comments:  SLUMS 15/30  Psychiatric:        Mood and Affect: Mood normal.    ASSESSMENT/ PLAN:  TODAY  CKD stage 3 due to type 2 diabetes mellitus (HCC) bun 45; creat 1.52; gfr 46   Moderate protein malnutrition (HCC) albumin 2.7 will continue supplements as directed  Hyperlipidemia associated with type 2 diabetes mellitus (HCC)   ldl 61 will continue lipitor 40 mg daily   PREVIOUS   4. Type 2 diabetes mellitus with neurological manifestations: hgb A1c 6.8 on asa/statin   5. Osteoporosis: t score: -2.863   6. Hypertension associated with type  2 diabetes mellitus: b/p 106/48 will monitor   7. Chronic constipation: will continue senna s 2 tabs twice daily as needed  8. GERD without esophagitis: will continue prilosec 20 mg daily   9. History of CVA: is on asa 162 mg daily is off plavix  10. Thyroid disease: tsh 3.137 will continue synthroid 75 mcg daily   11. Chronic depression: is on zoloft 50 mg daily; is tolerating this dose without difficulty  12. Hemiplegia and hemiparesis following cerebral infarction unspecified site: in on asa 162 mg daily   13. Vascular dementia without behavioral disturbance; psychotic disturbance; mood disturbance, anxiety. Weight is 182 without significant change will continue namenda 14 mg xr daily     Britt Candle NP [facility]   call [phone]

## 2023-10-11 NOTE — Assessment & Plan Note (Signed)
 ldl 61 will continue lipitor 40 mg daily

## 2023-10-11 NOTE — Assessment & Plan Note (Signed)
 albumin 2.7 will continue supplements as directed

## 2023-10-11 NOTE — Assessment & Plan Note (Signed)
 bun 45; creat 1.52; gfr 46

## 2023-10-17 ENCOUNTER — Non-Acute Institutional Stay (SKILLED_NURSING_FACILITY): Payer: Self-pay | Admitting: Adult Health

## 2023-10-17 ENCOUNTER — Encounter: Payer: Self-pay | Admitting: Adult Health

## 2023-10-17 DIAGNOSIS — Z0000 Encounter for general adult medical examination without abnormal findings: Secondary | ICD-10-CM

## 2023-10-17 NOTE — Progress Notes (Signed)
 Subjective:   Jason Cox is a 78 y.o. male who presents for Medicare Annual/Subsequent preventive examination.  Visit Complete: In person  Patient Medicare AWV questionnaire was completed by the patient on 10-17-2023; I have confirmed that all information answered by patient is correct and no changes since this date.        Objective:    Today's Vitals   10/17/23 1258  BP: (!) 100/56  Pulse: 69  Resp: 18  Temp: 97.9 F (36.6 C)  SpO2: 95%  Weight: 182 lb 6.4 oz (82.7 kg)  Height: 6' (1.829 m)   Body mass index is 24.74 kg/m.     03/30/2023    3:01 PM 03/01/2023   11:52 AM 11/30/2022    9:04 AM 10/06/2022    9:19 AM 09/14/2022   10:16 AM 08/26/2022   10:02 AM 08/09/2022   11:03 AM  Advanced Directives  Does Patient Have a Medical Advance Directive? Yes Yes Yes Yes Yes Yes Yes  Type of Advance Directive Out of facility DNR (pink MOST or yellow form) Out of facility DNR (pink MOST or yellow form) Out of facility DNR (pink MOST or yellow form) Out of facility DNR (pink MOST or yellow form) Out of facility DNR (pink MOST or yellow form) Out of facility DNR (pink MOST or yellow form) Out of facility DNR (pink MOST or yellow form)  Does patient want to make changes to medical advance directive? No - Patient declined No - Patient declined No - Patient declined No - Patient declined No - Patient declined No - Patient declined No - Patient declined  Pre-existing out of facility DNR order (yellow form or pink MOST form)  Yellow form placed in chart (order not valid for inpatient use) Yellow form placed in chart (order not valid for inpatient use) Yellow form placed in chart (order not valid for inpatient use) Yellow form placed in chart (order not valid for inpatient use) Yellow form placed in chart (order not valid for inpatient use) Yellow form placed in chart (order not valid for inpatient use)    Current Medications (verified) Outpatient Encounter Medications as of 10/17/2023   Medication Sig   aspirin  EC 81 MG tablet Take 162 mg by mouth daily. Swallow whole.   atorvastatin  (LIPITOR) 40 MG tablet Take 40 mg by mouth daily.   calcium -vitamin D (OSCAL WITH D) 500-5 MG-MCG tablet Take 1 tablet by mouth daily.   fluticasone (FLONASE) 50 MCG/ACT nasal spray Place 1 spray into both nostrils daily.   levothyroxine  (SYNTHROID ) 75 MCG tablet Take 75 mcg by mouth daily before breakfast.   memantine (NAMENDA XR) 14 MG CP24 24 hr capsule Take 14 mg by mouth.   NON FORMULARY Diet:Regular Diet with Thin Liquids   Nutritional Supplements (,FEEDING SUPPLEMENT, PROSOURCE PLUS) liquid Take 30 mLs by mouth 3 (three) times daily between meals. 9 am, 2 pm, and 9 pm   omeprazole (PRILOSEC OTC) 20 MG tablet Take 20 mg by mouth at bedtime.   Polyvinyl Alcohol-Povidone (ARTIFICIAL TEARS) 5-6 MG/ML SOLN Apply 1 drop to eye every hour as needed. Right eye   sertraline (ZOLOFT) 50 MG tablet Take 50 mg by mouth daily.   triamcinolone cream (KENALOG) 0.1 % Apply 1 Application topically 2 (two) times daily as needed.   No facility-administered encounter medications on file as of 10/17/2023.    Allergies (verified) Patient has no known allergies.   History: Past Medical History:  Diagnosis Date   Hypertension    Benign  Essential   Hypothyroidism    Obesity    Osteoarthritis    PVD (peripheral vascular disease) (HCC)    Type 2 diabetes mellitus with vascular complications    Past Surgical History:  Procedure Laterality Date   APPENDECTOMY     TONSILLECTOMY     Family History  Problem Relation Age of Onset   Osteoporosis Mother    Hypertension Mother    Cancer Mother    Heart attack Father    Heart attack Maternal Grandmother    Pneumonia Maternal Grandfather    Heart disease Paternal Grandmother    Heart disease Paternal Grandfather    Social History   Socioeconomic History   Marital status: Single    Spouse name: Not on file   Number of children: Not on file   Years  of education: 12   Highest education level: Not on file  Occupational History   Occupation: RETIRED  Tobacco Use   Smoking status: Former   Smokeless tobacco: 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 Used  Substance and Sexual Activity   Alcohol use: Not Currently   Drug use: Never   Sexual activity: Not Currently  Other Topics Concern   Not on file  Social History Narrative   Not on file   Social Drivers of Health   Financial Resource Strain: Not on file  Food Insecurity: Not on file  Transportation Needs: Not on file  Physical Activity: Not on file  Stress: Not on file  Social Connections: Not on file    Tobacco Counseling Counseling given: Not Answered   Clinical Intake:  Pre-visit preparation completed: Yes  Pain : Faces Faces Pain Scale: No hurt  Faces Pain Scale: No hurt  BMI - recorded: 24.74 Nutritional Status: BMI 25 -29 Overweight Nutritional Risks: Unintentional weight loss, Failure to thrive            Activities of Daily Living     No data to display          Patient Care Team: Marilyne Shu, NP as PCP - General (Geriatric Medicine) Kathyleen Parkins, MD as Referring Physician (Internal Medicine)  Indicate any recent Medical Services you may have received from other than [facility] providers in the past year (date may be approximate).     Assessment:   This is a routine wellness examination for Jacquez.  Hearing/Vision screen No results found.   Goals Addressed   None    Depression Screen    07/10/2023    2:55 PM 11/30/2022    2:14 PM 08/09/2022    3:10 PM 08/10/2021    1:28 PM 08/05/2021   10:26 AM 04/26/2021    1:39 PM  PHQ 2/9 Scores  PHQ - 2 Score  0 0 0 0 0  PHQ- 9 Score    1    Exception Documentation Other- indicate reason in comment box       Not completed unable to fully participate         Fall Risk    07/10/2023    2:55 PM 08/09/2022    3:10 PM 04/06/2022    9:18 AM 08/10/2021    1:28 PM 08/05/2021   10:25 AM   Fall Risk   Falls in the past year? 1 0 1 1 1   Number falls in past yr: 0 0 0 0 0  Injury with Fall? 0 0 0 0 0  Risk for fall due to : History of fall(s);Impaired balance/gait;Impaired mobility Impaired  balance/gait;Impaired mobility History of fall(s);Impaired balance/gait;Impaired mobility History of fall(s);Impaired balance/gait;Impaired mobility History of fall(s);Impaired balance/gait;Impaired mobility  Follow up  Falls evaluation completed Falls evaluation completed      MEDICARE RISK AT HOME:    TIMED UP AND GO:  Was the test performed?  No    Cognitive Function:    08/09/2022    3:11 PM 08/05/2021   10:27 AM 04/26/2021    1:40 PM  MMSE - Mini Mental State Exam  Not completed: Unable to complete Unable to complete Unable to complete        Immunizations Immunization History  Administered Date(s) Administered   Fluad Quad(high Dose 65+) 04/24/2023   Influenza Inj Mdck Quad Pf 03/31/2021   Influenza-Unspecified 03/29/2022   Janssen (J&J) SARS-COV-2 Vaccination 10/04/2019   Moderna Covid-19 Vaccine  Bivalent Booster 27yrs & up 04/20/2021   Moderna SARS-COV2 Booster Vaccination 11/16/2021   Moderna Sars-Covid-2 Vaccination 02/10/2021, 04/20/2022, 09/26/2023   PNEUMOCOCCAL CONJUGATE-20 04/02/2021   Pneumococcal Polysaccharide-23 02/06/2010   RSV,unspecified 05/30/2022   Respiratory Syncytial Virus Vaccine,Recomb Aduvanted(Arexvy) 05/30/2022   Tdap 04/28/2021   Unspecified SARS-COV-2 Vaccination 04/20/2022   Zoster Recombinant(Shingrix) 10/22/2021, 01/19/2022    TDAP status: Up to date  Flu Vaccine status: Up to date  Pneumococcal vaccine status: Up to date  Covid-19 vaccine status: Completed vaccines  Qualifies for Shingles Vaccine? No   Zostavax completed     Screening Tests Health Maintenance  Topic Date Due   Medicare Annual Wellness (AWV)  08/10/2023   OPHTHALMOLOGY EXAM  11/03/2023   COVID-19 Vaccine (7 - 2024-25 season) 11/21/2023   HEMOGLOBIN A1C   01/10/2024   INFLUENZA VACCINE  01/26/2024   FOOT EXAM  04/03/2024   Diabetic kidney evaluation - eGFR measurement  06/22/2024   Diabetic kidney evaluation - Urine ACR  07/12/2024   DTaP/Tdap/Td (2 - Td or Tdap) 04/29/2031   Pneumonia Vaccine 38+ Years old  Completed   Zoster Vaccines- Shingrix  Completed   HPV VACCINES  Aged Out   Meningococcal B Vaccine  Aged Out   Hepatitis C Screening  Discontinued    Health Maintenance  Health Maintenance Due  Topic Date Due   Medicare Annual Wellness (AWV)  08/10/2023    Colorectal cancer screening: No longer required.   Lung Cancer Screening: (Low Dose CT Chest recommended if Age 34-80 years, 20 pack-year currently smoking OR have quit w/in 15years.) does not qualify.   Lung Cancer Screening Referral:   Additional Screening:  Hepatitis C Screening: does not qualify; Completed   Vision Screening: Recommended annual ophthalmology exams for early detection of glaucoma and other disorders of the eye. Is the patient up to date with their annual eye exam?  No  Who is the provider or what is the name of the office in which the patient attends annual eye exams?  If pt is not established with a provider, would they like to be referred to a provider to establish care? No .   Dental Screening: Recommended annual dental exams for proper oral hygiene  Diabetic Foot Exam: Diabetic Foot Exam: Completed 03/2023  Community Resource Referral / Chronic Care Management: CRR required this visit?  No   CCM required this visit?  No     Plan:     I have personally reviewed and noted the following in the patient's chart:   Medical and social history Use of alcohol, tobacco or illicit drugs  Current medications and supplements including opioid prescriptions. Patient is not currently taking opioid prescriptions. Functional  ability and status Nutritional status Physical activity Advanced directives List of other physicians Hospitalizations,  surgeries, and ER visits in previous 12 months Vitals Screenings to include cognitive, depression, and falls Referrals and appointments  In addition, I have reviewed and discussed with patient certain preventive protocols, quality metrics, and best practice recommendations. A written personalized care plan for preventive services as well as general preventive health recommendations were provided to patient.     Marilyne Shu, NP   10/17/2023   After Visit Summary: (In Person-Printed) AVS printed and given to the patient  Nurse Notes: this exam was performed by myself at this facility

## 2023-11-06 ENCOUNTER — Non-Acute Institutional Stay (SKILLED_NURSING_FACILITY): Payer: Self-pay | Admitting: Internal Medicine

## 2023-11-06 ENCOUNTER — Encounter: Payer: Self-pay | Admitting: Internal Medicine

## 2023-11-06 DIAGNOSIS — E039 Hypothyroidism, unspecified: Secondary | ICD-10-CM | POA: Diagnosis not present

## 2023-11-06 DIAGNOSIS — F01C0 Vascular dementia, severe, without behavioral disturbance, psychotic disturbance, mood disturbance, and anxiety: Secondary | ICD-10-CM

## 2023-11-06 DIAGNOSIS — I69359 Hemiplegia and hemiparesis following cerebral infarction affecting unspecified side: Secondary | ICD-10-CM

## 2023-11-06 NOTE — Assessment & Plan Note (Signed)
 Strength to opposition is good and equal in all extremities.  The hemiplegia and hemiparesis has essentially resolved; but he has a severe neurocognitive deficit manifested as confabulation.

## 2023-11-06 NOTE — Assessment & Plan Note (Signed)
 Despite complaints of cold intolerance and anxiety, TSH is therapeutic on L-thyroxine 75 mcg daily.  No change indicated continue to monitor.

## 2023-11-06 NOTE — Assessment & Plan Note (Signed)
 Today he confabulates about not being allowed to leave the facility as he wants to return home to live with his parents who are "100 or close to it."

## 2023-11-06 NOTE — Patient Instructions (Signed)
 See assessment and plan under each diagnosis in the problem list and acutely for this visit

## 2023-11-06 NOTE — Progress Notes (Unsigned)
 NURSING HOME LOCATION:  [facility] ROOM NUMBER:  110 W  CODE STATUS: DNR   PCP: Marilyne Shu, NP   This is a nursing facility follow up visit for of chronic medical diagnoses to document compliance with Regulation 483.30 (c) in The Long Term Care Survey Manual Phase 2 which mandates caregiver visit ( visits can alternate among physician, PA or NP as per statutes) within 10 days of 30 days / 60 days/ 90 days post admission to SNF date  .  Interim medical record and care since last SNF visit was updated with review of diagnostic studies and change in clinical status since last visit were documented.  HPI: He is permanent resident of this facility with medical diagnoses of degenerative joint disease, peripheral vascular disease, diabetes with vascular complications, essential hypertension, and hypothyroidism. Thyroid  function test are current and are therapeutic on L-thyroxine 75 mcg daily.  He has very mild and minimally progressive anemia with most recent H/H of 11.5/35.6 on 07/26/2023.  Over the last year creatinine has ranged from a low of 1.41 up to high of 1.71. Current creatinine is 1.52.   GFR nadir has been 40 with a high of 51. Current GFR is 46 indicating CKD stage low 3A.  Review of systems: Dementia invalidated responses.  He states that the stays sleepy & he questions whether it is related to one of his medicines "Chantix."  In reviewing the med list from Epic I find no such medication. He also states that he stays cold all the time. He does state that he is anxious because "I want out of here."  He states that prior to his stroke symptoms which "are gone" he lived with his parents "across the street."  He states that his mother is over 50 and his father is "close to it."  He stated he wanted to go home for Easter but was told that he could not and that the police would be called if he attempted to leave the facility.  Constitutional: No fever, significant  weight change, fatigue  Eyes: No redness, discharge, pain, vision change ENT/mouth: No nasal congestion,  purulent discharge, earache, change in hearing, sore throat  Cardiovascular: No chest pain, palpitations, paroxysmal nocturnal dyspnea,  edema  Respiratory: No cough, sputum production, hemoptysis, DOE, significant snoring, apnea   Gastrointestinal: No heartburn, dysphagia, abdominal pain, nausea /vomiting, rectal bleeding, melena, change in bowels Genitourinary: No dysuria, hematuria, pyuria, incontinence, nocturia Musculoskeletal: No joint stiffness, joint swelling, weakness, pain Dermatologic: No rash, pruritus, change in appearance of skin Neurologic: No dizziness, headache, syncope, seizures, numbness, tingling Psychiatric: No  insomnia, anorexia Endocrine: No change in hair/skin/nails, excessive thirst, excessive hunger, excessive urination  Hematologic/lymphatic: No significant bruising, lymphadenopathy, abnormal bleeding Allergy/immunology: No itchy/watery eyes, significant sneezing, urticaria, angioedema  Physical exam:  Pertinent or positive findings: He was initially asleep exhibiting hypopnea without snoring or frank apnea.  There are particles of food on his shirt.  He has a full head of white hair.  He is Transport planner.  Eyebrows are decreased in density.  Heart sounds are distant.  Central obesity is present.  Pedal pulses are palpable but decreased.  He has 1/2+ edema at the sock line.  Clubbing of the nailbeds is suggested.  Interosseous wasting of the hands is present.  While asleep he had intermittent jerking of the left lower extremity.  Strength to opposition in all extremities is good and equal.  General appearance: no acute distress, increased work of breathing  is present.   Lymphatic: No lymphadenopathy about the head, neck, axilla. Eyes: No conjunctival inflammation or lid edema is present. There is no scleral icterus. Ears:  External ear exam shows no significant lesions  or deformities.   Nose:  External nasal examination shows no deformity or inflammation. Nasal mucosa are pink and moist without lesions, exudates Oral exam:  Lips and gums are healthy appearing. There is no oropharyngeal erythema or exudate. Neck:  No thyromegaly, masses, tenderness noted.    Heart:  Normal rate and regular rhythm. S1 and S2 normal without gallop, murmur, click, rub .  Lungs: Chest clear to auscultation without wheezes, rhonchi, rales, rubs. Abdomen: Bowel sounds are normal. Abdomen is soft and nontender with no organomegaly, hernias, masses. GU: Deferred  Extremities:  No cyanosis  Neurologic exam :Balance, Rhomberg, finger to nose testing could not be completed due to clinical state Skin: Warm & dry w/o tenting. No significant lesions or rash.  See summary under each active problem in the Problem List with associated updated therapeutic plan

## 2023-11-15 ENCOUNTER — Encounter: Payer: Self-pay | Admitting: Adult Health

## 2023-11-15 NOTE — Progress Notes (Signed)
 Location:  [facility] Room Number: 111 Place of Service:  SNF (31)   CODE STATUS: dnr   No Known Allergies  Chief Complaint  Patient presents with   Acute Visit    Care plan meeting     HPI:  We have come together for his care plan meeting.  BIMS 13/15 mood 0/30 he uses wheelchair without falls. He requires dependent assist with his adl care . He is incontinent of bladder and bowel. Dietary: regular diet; setup for meals; appetite 76-100%; weight is 180.6 pounds. Therapy none at this time. He continues to be followed for his chronic illnesses including: Hypertension associated with type 2 diabetes mellitus  Aortic atherosclerosis  Hemiplegia and hemiparesis following cerebral infarction affecting unspecified side  Past Medical History:  Diagnosis Date   Hypertension    Benign Essential   Hypothyroidism    Obesity    Osteoarthritis    PVD (peripheral vascular disease) (HCC)    Type 2 diabetes mellitus with vascular complications     Past Surgical History:  Procedure Laterality Date   APPENDECTOMY     TONSILLECTOMY      Social History   Socioeconomic History   Marital status: Single    Spouse name: Not on file   Number of children: Not on file   Years of education: 12   Highest education level: Not on file  Occupational History   Occupation: RETIRED  Tobacco Use   Smoking status: Former   Smokeless tobacco: 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 Used  Substance and Sexual Activity   Alcohol use: Not Currently   Drug use: Never   Sexual activity: Not Currently  Other Topics Concern   Not on file  Social History Narrative   Not on file   Social Drivers of Health   Financial Resource Strain: Not on file  Food Insecurity: Not on file  Transportation Needs: Not on file  Physical Activity: Not on file  Stress: Not on file  Social Connections: Not on file  Intimate Partner Violence: Not on file   Family History  Problem  Relation Age of Onset   Osteoporosis Mother    Hypertension Mother    Cancer Mother    Heart attack Father    Heart attack Maternal Grandmother    Pneumonia Maternal Grandfather    Heart disease Paternal Grandmother    Heart disease Paternal Grandfather       VITAL SIGNS BP (!) 92/58   Pulse 79   Temp 98.7 F (37.1 C)   Resp 14   Ht 6' (1.829 m)   Wt 180 lb 9.6 oz (81.9 kg)   SpO2 97%   BMI 24.49 kg/m   Outpatient Encounter Medications as of 11/16/2023  Medication Sig   aspirin  EC 81 MG tablet Take 162 mg by mouth daily. Swallow whole.   atorvastatin  (LIPITOR) 40 MG tablet Take 40 mg by mouth daily.   calcium -vitamin D (OSCAL WITH D) 500-5 MG-MCG tablet Take 1 tablet by mouth daily.   fluticasone (FLONASE) 50 MCG/ACT nasal spray Place 1 spray into both nostrils daily.   levothyroxine  (SYNTHROID ) 75 MCG tablet Take 75 mcg by mouth daily before breakfast.   memantine (NAMENDA XR) 14 MG CP24 24 hr capsule Take 14 mg by mouth.   NON FORMULARY Diet:Regular Diet with Thin Liquids   Nutritional Supplements (,FEEDING SUPPLEMENT, PROSOURCE PLUS) liquid Take 30 mLs by mouth 3 (three) times daily between meals. 9 am,  2 pm, and 9 pm   omeprazole (PRILOSEC OTC) 20 MG tablet Take 20 mg by mouth at bedtime.   Polyvinyl Alcohol-Povidone (ARTIFICIAL TEARS) 5-6 MG/ML SOLN Apply 1 drop to eye every hour as needed. Right eye   sertraline (ZOLOFT) 50 MG tablet Take 50 mg by mouth daily.   triamcinolone cream (KENALOG) 0.1 % Apply 1 Application topically 2 (two) times daily as needed.   No facility-administered encounter medications on file as of 11/16/2023.     SIGNIFICANT DIAGNOSTIC EXAMS  LABS REVIEWED: PREVIOUS   02-05-23: wbc 9.0; hgb 12.8; hct 38.5; mcv 95.5 plt 205; glucose 149; bun 42; creat 1.71; k+ 3.9; na++ 133; ca 8.3 gfr 40; d-dimer 0.45 CRP 3.6 02-06-23: wbc 6.9; hgb 11.8; hct 35.7; mcv 94.2 plt 185; glucose 121; bun 38; creat 1.41; k+ 3.7; na++ 134; ca 8.1; gfr 51; protein 5.9  albumin 2.9 tsh 1.528 03-17-23: tsh 2.641 06-23-23: glucose 129; bun 45; creat 1.52; k+ 3.9; na++ 136; ca 8.4; gfr 46    07-13-23: hgb A1c 6.8 urine microalbumin 172.6 07-26-23: wbc 7.5; hgb 11.5; hct 35.6; mcv 97.0 plt 221 09-08-23: tsh 3.137; free t3: 2.1 free t4: 0.91  NO NEW LABS.   Review of Systems  Unable to perform ROS: Dementia   Physical Exam Constitutional:      General: He is not in acute distress.    Appearance: He is well-developed. He is not diaphoretic.  Neck:     Thyroid : No thyromegaly.  Cardiovascular:     Rate and Rhythm: Normal rate and regular rhythm.     Pulses: Normal pulses.     Heart sounds: Normal heart sounds.  Pulmonary:     Effort: Pulmonary effort is normal. No respiratory distress.     Breath sounds: Normal breath sounds.  Abdominal:     General: Bowel sounds are normal. There is no distension.     Palpations: Abdomen is soft.     Tenderness: There is no abdominal tenderness.  Musculoskeletal:        General: Normal range of motion.     Cervical back: Neck supple.  Lymphadenopathy:     Cervical: No cervical adenopathy.  Skin:    General: Skin is warm and dry.  Neurological:     Mental Status: He is alert. Mental status is at baseline.     Comments: SLUMS 15/30  Psychiatric:        Mood and Affect: Mood normal.     ASSESSMENT/ PLAN:  TODAY  Hypertension associated with type 2 diabetes mellitus Aortic atherosclerosis Hemiplegia and hemiparesis following cerebral infarction affecting unspecified side   Will continue current medications Will continue current plan of care Will continue to monitor his status.   Time spent with patient: care plan; medications; dietary.    Britt Candle NP [facility]  call [phone]

## 2023-11-16 ENCOUNTER — Non-Acute Institutional Stay (SKILLED_NURSING_FACILITY): Payer: Self-pay | Admitting: Adult Health

## 2023-11-16 DIAGNOSIS — I69359 Hemiplegia and hemiparesis following cerebral infarction affecting unspecified side: Secondary | ICD-10-CM

## 2023-11-16 DIAGNOSIS — E1159 Type 2 diabetes mellitus with other circulatory complications: Secondary | ICD-10-CM | POA: Diagnosis not present

## 2023-11-16 DIAGNOSIS — I152 Hypertension secondary to endocrine disorders: Secondary | ICD-10-CM | POA: Diagnosis not present

## 2023-11-16 DIAGNOSIS — I700 Atherosclerosis of aorta: Secondary | ICD-10-CM | POA: Diagnosis not present

## 2023-12-04 ENCOUNTER — Encounter: Payer: Self-pay | Admitting: Adult Health

## 2023-12-04 ENCOUNTER — Non-Acute Institutional Stay (SKILLED_NURSING_FACILITY): Payer: Self-pay | Admitting: Adult Health

## 2023-12-04 DIAGNOSIS — E1149 Type 2 diabetes mellitus with other diabetic neurological complication: Secondary | ICD-10-CM | POA: Diagnosis not present

## 2023-12-04 DIAGNOSIS — I152 Hypertension secondary to endocrine disorders: Secondary | ICD-10-CM | POA: Diagnosis not present

## 2023-12-04 DIAGNOSIS — E1159 Type 2 diabetes mellitus with other circulatory complications: Secondary | ICD-10-CM | POA: Diagnosis not present

## 2023-12-04 DIAGNOSIS — M810 Age-related osteoporosis without current pathological fracture: Secondary | ICD-10-CM

## 2023-12-04 NOTE — Progress Notes (Unsigned)
 Location:  [facility] Room Number: 110 Place of Service:  SNF (31)   CODE STATUS: dnr   No Known Allergies  Chief Complaint  Patient presents with   Medical Management of Chronic Issues        Type 2 diabetes mellitus with neurological manifestations:  Osteoporosis: Hypertension associated with type 2 diabetes mellitus    HPI:  He is a 78 y.o. long term resident of this facility being seen for the management of his chronic illnesses: Type 2 diabetes mellitus with neurological manifestations:  Osteoporosis: Hypertension associated with type 2 diabetes mellitus. There are no reports of uncontrolled pain. No reports of agitation or anxiety.    Past Medical History:  Diagnosis Date   Hypertension    Benign Essential   Hypothyroidism    Obesity    Osteoarthritis    PVD (peripheral vascular disease) (HCC)    Type 2 diabetes mellitus with vascular complications     Past Surgical History:  Procedure Laterality Date   APPENDECTOMY     TONSILLECTOMY      Social History   Socioeconomic History   Marital status: Single    Spouse name: Not on file   Number of children: Not on file   Years of education: 12   Highest education level: Not on file  Occupational History   Occupation: RETIRED  Tobacco Use   Smoking status: Former   Smokeless tobacco: 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 Used  Substance and Sexual Activity   Alcohol use: Not Currently   Drug use: Never   Sexual activity: Not Currently  Other Topics Concern   Not on file  Social History Narrative   Not on file   Social Drivers of Health   Financial Resource Strain: Not on file  Food Insecurity: Not on file  Transportation Needs: Not on file  Physical Activity: Not on file  Stress: Not on file  Social Connections: Not on file  Intimate Partner Violence: Not on file   Family History  Problem Relation Age of Onset   Osteoporosis Mother    Hypertension Mother    Cancer  Mother    Heart attack Father    Heart attack Maternal Grandmother    Pneumonia Maternal Grandfather    Heart disease Paternal Grandmother    Heart disease Paternal Grandfather       VITAL SIGNS BP (!) 105/57   Pulse 72   Temp 97.6 F (36.4 C)   Resp 18   Ht 6' (1.829 m)   Wt 177 lb 3.2 oz (80.4 kg)   SpO2 96%   BMI 24.03 kg/m   Outpatient Encounter Medications as of 12/04/2023  Medication Sig   aspirin  EC 81 MG tablet Take 162 mg by mouth daily. Swallow whole.   atorvastatin  (LIPITOR) 40 MG tablet Take 40 mg by mouth daily.   calcium -vitamin D (OSCAL WITH D) 500-5 MG-MCG tablet Take 1 tablet by mouth daily.   fluticasone (FLONASE) 50 MCG/ACT nasal spray Place 1 spray into both nostrils daily.   levothyroxine  (SYNTHROID ) 75 MCG tablet Take 75 mcg by mouth daily before breakfast.   memantine (NAMENDA XR) 14 MG CP24 24 hr capsule Take 14 mg by mouth.   NON FORMULARY Diet:Regular Diet with Thin Liquids   Nutritional Supplements (,FEEDING SUPPLEMENT, PROSOURCE PLUS) liquid Take 30 mLs by mouth 3 (three) times daily between meals. 9 am, 2 pm, and 9 pm   omeprazole (PRILOSEC OTC) 20 MG tablet  Take 20 mg by mouth at bedtime.   Polyvinyl Alcohol-Povidone (ARTIFICIAL TEARS) 5-6 MG/ML SOLN Apply 1 drop to eye every hour as needed. Right eye   sertraline (ZOLOFT) 50 MG tablet Take 50 mg by mouth daily.   triamcinolone cream (KENALOG) 0.1 % Apply 1 Application topically 2 (two) times daily as needed.   No facility-administered encounter medications on file as of 12/04/2023.     SIGNIFICANT DIAGNOSTIC EXAMS  LABS REVIEWED: PREVIOUS   02-05-23: wbc 9.0; hgb 12.8; hct 38.5; mcv 95.5 plt 205; glucose 149; bun 42; creat 1.71; k+ 3.9; na++ 133; ca 8.3 gfr 40; d-dimer 0.45 CRP 3.6 02-06-23: wbc 6.9; hgb 11.8; hct 35.7; mcv 94.2 plt 185; glucose 121; bun 38; creat 1.41; k+ 3.7; na++ 134; ca 8.1; gfr 51; protein 5.9 albumin 2.9 tsh 1.528 03-17-23: tsh 2.641 06-23-23: glucose 129; bun 45; creat  1.52; k+ 3.9; na++ 136; ca 8.4; gfr 46     TODAY  07-13-23: hgb A1c 6.8 urine microalbumin 172.6 07-26-23: wbc 7.5; hgb 11.5; hct 35.6; mcv 97.0 plt 221 09-08-23: tsh 3.137; free t3: 2.1 free t4: 0.91  Review of Systems  Unable to perform ROS: Dementia   Physical Exam Constitutional:      General: He is not in acute distress.    Appearance: He is well-developed. He is not diaphoretic.  Neck:     Thyroid : No thyromegaly.   Cardiovascular:     Rate and Rhythm: Normal rate and regular rhythm.     Pulses: Normal pulses.     Heart sounds: Normal heart sounds.  Pulmonary:     Effort: Pulmonary effort is normal. No respiratory distress.     Breath sounds: Normal breath sounds.  Abdominal:     General: Bowel sounds are normal. There is no distension.     Palpations: Abdomen is soft.     Tenderness: There is no abdominal tenderness.   Musculoskeletal:        General: Normal range of motion.     Cervical back: Neck supple.     Right lower leg: No edema.     Left lower leg: No edema.  Lymphadenopathy:     Cervical: No cervical adenopathy.   Skin:    General: Skin is warm and dry.   Neurological:     Mental Status: He is alert. Mental status is at baseline.     Comments:  SLUMS 15/30  Psychiatric:        Mood and Affect: Mood normal.      ASSESSMENT/ PLAN:  TODAY  Type 2 diabetes mellitus with neurological manifestations: hgb A1c 6.8; is on asa and statin  2. Osteoporosis: t score -2.863  3. Hypertension associated with type 2 diabetes mellitus: b/p 105/57  PREVIOUS   4. Chronic constipation: will continue senna s 2 tabs twice daily as needed  5. GERD without esophagitis: will continue prilosec 20 mg daily   6. History of CVA: is on asa 162 mg daily is off plavix   7. Thyroid  disease: tsh 3.137 will continue synthroid  75 mcg daily   8. Chronic depression: is on zoloft 50 mg daily; is tolerating this dose without difficulty  9. Hemiplegia and hemiparesis  following cerebral infarction unspecified site: in on asa 162 mg daily   10. Vascular dementia without behavioral disturbance; psychotic disturbance; mood disturbance, anxiety. Weight is 177 without significant change will continue namenda 14 mg xr daily   11. CKD stage 3 due to type 2 diabetes mellitus (HCC) bun  45; creat 1.52; gfr 46   12. Moderate protein malnutrition (HCC) albumin 2.7 will continue supplements as directed  13. Hyperlipidemia associated with type 2 diabetes mellitus (HCC) ldl 61 will continue lipitor 40 mg daily   Will check hgb A1c; cbc; cmp; lipid        Britt Candle NP [facility]   call [phone]

## 2023-12-07 ENCOUNTER — Other Ambulatory Visit (HOSPITAL_COMMUNITY)
Admission: RE | Admit: 2023-12-07 | Discharge: 2023-12-07 | Disposition: A | Discharge status: Home / Self Care | Source: Skilled Nursing Facility | Attending: Adult Health | Admitting: Adult Health

## 2023-12-07 DIAGNOSIS — E113393 Type 2 diabetes mellitus with moderate nonproliferative diabetic retinopathy without macular edema, bilateral: Secondary | ICD-10-CM | POA: Insufficient documentation

## 2023-12-07 DIAGNOSIS — E083393 Diabetes mellitus due to underlying condition with moderate nonproliferative diabetic retinopathy without macular edema, bilateral: Secondary | ICD-10-CM | POA: Insufficient documentation

## 2023-12-07 LAB — COMPREHENSIVE METABOLIC PANEL WITH GFR
ALT: 15 U/L (ref 0–44)
AST: 14 U/L — ABNORMAL LOW (ref 15–41)
Albumin: 2.7 g/dL — ABNORMAL LOW (ref 3.5–5.0)
Alkaline Phosphatase: 111 U/L (ref 38–126)
Anion gap: 9 (ref 5–15)
BUN: 37 mg/dL — ABNORMAL HIGH (ref 8–23)
CO2: 25 mmol/L (ref 22–32)
Calcium: 8.8 mg/dL — ABNORMAL LOW (ref 8.9–10.3)
Chloride: 105 mmol/L (ref 98–111)
Creatinine, Ser: 1.58 mg/dL — ABNORMAL HIGH (ref 0.61–1.24)
GFR, Estimated: 44 mL/min — ABNORMAL LOW (ref >60)
Glucose, Bld: 144 mg/dL — ABNORMAL HIGH (ref 70–99)
Potassium: 4.1 mmol/L (ref 3.5–5.1)
Sodium: 139 mmol/L (ref 135–145)
Total Bilirubin: 0.4 mg/dL (ref 0.0–1.2)
Total Protein: 6 g/dL — ABNORMAL LOW (ref 6.5–8.1)

## 2023-12-07 LAB — CBC
HCT: 37.5 % — ABNORMAL LOW (ref 39.0–52.0)
Hemoglobin: 12.3 g/dL — ABNORMAL LOW (ref 13.0–17.0)
MCH: 30.9 pg (ref 26.0–34.0)
MCHC: 32.8 g/dL (ref 30.0–36.0)
MCV: 94.2 fL (ref 80.0–100.0)
Platelets: 258 10*3/uL (ref 150–400)
RBC: 3.98 MIL/uL — ABNORMAL LOW (ref 4.22–5.81)
RDW: 12.3 % (ref 11.5–15.5)
WBC: 9.6 10*3/uL (ref 4.0–10.5)
nRBC: 0 % (ref 0.0–0.2)

## 2023-12-07 LAB — LIPID PANEL
Cholesterol: 93 mg/dL (ref 0–200)
HDL: 26 mg/dL — ABNORMAL LOW (ref >40)
LDL Cholesterol: 45 mg/dL (ref 0–99)
Total CHOL/HDL Ratio: 3.6 ratio
Triglycerides: 112 mg/dL (ref <150)
VLDL: 22 mg/dL (ref 0–40)

## 2023-12-07 LAB — HEMOGLOBIN A1C
Hgb A1c MFr Bld: 7.3 % — ABNORMAL HIGH (ref 4.8–5.6)
Mean Plasma Glucose: 162.81 mg/dL

## 2023-12-11 LAB — HM DIABETES FOOT EXAM: HM Diabetic Foot Exam: 360

## 2023-12-25 ENCOUNTER — Telehealth: Payer: Self-pay | Admitting: *Deleted

## 2023-12-25 NOTE — Telephone Encounter (Signed)
 Copied from CRM [id number]. Topic: General - Other >> Dec 25, 2023 10:42 AM Farrel B wrote: Reason for CRM: Nusrat at [facility] on b/h of [employer] [phone] has reached out to the providers for updated information on patient. She states that patient's contact number and address has changed and has not picked up his cholesterol medications in over a month. Please call [facility] back in regards.

## 2024-01-05 ENCOUNTER — Non-Acute Institutional Stay (SKILLED_NURSING_FACILITY): Payer: Self-pay | Admitting: Adult Health

## 2024-01-05 DIAGNOSIS — K5909 Other constipation: Secondary | ICD-10-CM | POA: Diagnosis not present

## 2024-01-05 DIAGNOSIS — K219 Gastro-esophageal reflux disease without esophagitis: Secondary | ICD-10-CM | POA: Diagnosis not present

## 2024-01-05 DIAGNOSIS — E1169 Type 2 diabetes mellitus with other specified complication: Secondary | ICD-10-CM | POA: Diagnosis not present

## 2024-01-05 DIAGNOSIS — E785 Hyperlipidemia, unspecified: Secondary | ICD-10-CM

## 2024-01-05 NOTE — Progress Notes (Unsigned)
 Location:  [facility] Room Number: 111 W Place of Service:  SNF (31)   CODE STATUS: DNR  No Known Allergies  Chief Complaint  Patient presents with   Medical Management of Chronic Issues    Routine visit     HPI:    Past Medical History:  Diagnosis Date   Hypertension    Benign Essential   Hypothyroidism    Obesity    Osteoarthritis    PVD (peripheral vascular disease) (HCC)    Type 2 diabetes mellitus with vascular complications     Past Surgical History:  Procedure Laterality Date   APPENDECTOMY     TONSILLECTOMY      Social History   Socioeconomic History   Marital status: Single    Spouse name: Not on file   Number of children: Not on file   Years of education: 12   Highest education level: Not on file  Occupational History   Occupation: RETIRED  Tobacco Use   Smoking status: Former   Smokeless tobacco: 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 Used  Substance and Sexual Activity   Alcohol use: Not Currently   Drug use: Never   Sexual activity: Not Currently  Other Topics Concern   Not on file  Social History Narrative   Not on file   Social Drivers of Health   Financial Resource Strain: Not on file  Food Insecurity: Not on file  Transportation Needs: Not on file  Physical Activity: Not on file  Stress: Not on file  Social Connections: Not on file  Intimate Partner Violence: Not on file   Family History  Problem Relation Age of Onset   Osteoporosis Mother    Hypertension Mother    Cancer Mother    Heart attack Father    Heart attack Maternal Grandmother    Pneumonia Maternal Grandfather    Heart disease Paternal Grandmother    Heart disease Paternal Grandfather       VITAL SIGNS BP (!) 140/70   Pulse 78   Ht 6' (1.829 m)   Wt 177 lb (80.3 kg)   SpO2 97%   BMI 24.01 kg/m   Outpatient Encounter Medications as of 01/05/2024  Medication Sig   aspirin  EC 81 MG tablet Take 162 mg by mouth daily. Swallow  whole.   atorvastatin  (LIPITOR) 40 MG tablet Take 40 mg by mouth daily.   calcium -vitamin D (OSCAL WITH D) 500-5 MG-MCG tablet Take 1 tablet by mouth daily.   fluticasone (FLONASE) 50 MCG/ACT nasal spray Place 1 spray into both nostrils 2 (two) times daily as needed.   Infant Foods (PROTEIN FORTIFIER) LIQD Take 60 mLs by mouth daily. In the morning, any brand   levothyroxine  (SYNTHROID ) 75 MCG tablet Take 75 mcg by mouth daily before breakfast.   MEMANTINE HCL PO Take 7 mg by mouth daily.   NON FORMULARY Diet:Regular Diet with Thin Liquids   Nutritional Supplements (,FEEDING SUPPLEMENT, PROSOURCE PLUS) liquid Take 30 mLs by mouth at bedtime. 9 am, 2 pm, and 9 pm   omeprazole (PRILOSEC OTC) 20 MG tablet Take 20 mg by mouth at bedtime.   Polyvinyl Alcohol-Povidone (ARTIFICIAL TEARS) 5-6 MG/ML SOLN Apply 1 drop to eye every hour as needed. Right eye   sertraline (ZOLOFT) 50 MG tablet Take 50 mg by mouth daily.   triamcinolone cream (KENALOG) 0.1 % Apply 1 Application topically 2 (two) times daily as needed.   memantine (NAMENDA XR) 14 MG CP24 24  hr capsule Take 14 mg by mouth. (Patient not taking: Reported on 01/05/2024)   No facility-administered encounter medications on file as of 01/05/2024.     SIGNIFICANT DIAGNOSTIC EXAMS       ASSESSMENT/ PLAN:     Barnie Seip NP [facility]  Contact [phone] Monday through Friday 8am- 5pm  After hours call [phone]

## 2024-02-01 ENCOUNTER — Encounter: Payer: Self-pay | Admitting: Internal Medicine

## 2024-02-01 ENCOUNTER — Non-Acute Institutional Stay (SKILLED_NURSING_FACILITY): Payer: Self-pay | Admitting: Internal Medicine

## 2024-02-01 DIAGNOSIS — E440 Moderate protein-calorie malnutrition: Secondary | ICD-10-CM

## 2024-02-01 DIAGNOSIS — N1832 Chronic kidney disease, stage 3b: Secondary | ICD-10-CM

## 2024-02-01 DIAGNOSIS — E1149 Type 2 diabetes mellitus with other diabetic neurological complication: Secondary | ICD-10-CM | POA: Diagnosis not present

## 2024-02-01 DIAGNOSIS — M810 Age-related osteoporosis without current pathological fracture: Secondary | ICD-10-CM

## 2024-02-01 DIAGNOSIS — F01C0 Vascular dementia, severe, without behavioral disturbance, psychotic disturbance, mood disturbance, and anxiety: Secondary | ICD-10-CM

## 2024-02-01 NOTE — Assessment & Plan Note (Addendum)
 Again he confabulates about wanting to return home to live with his parents.  He stated that his mother was 59 and his father 4, 1 year apart.  Then he gave his father's DOB as 1919 and his mother's as 1920 which would make them 106 & 105 respectively.

## 2024-02-01 NOTE — Assessment & Plan Note (Addendum)
 His bed or wheelchair state unfortunately exacerbates osteoporosis progression; but hypocalcemia has improved slightly from 8.4 up to 8.8.

## 2024-02-01 NOTE — Assessment & Plan Note (Addendum)
 Albumin has dropped from 2.9 down to 2.7 but total protein has risen minimally from 5.9 up to 6.  Nutritionist continue to follow.

## 2024-02-01 NOTE — Assessment & Plan Note (Signed)
 Diabetic control has decreased slightly with A1c rising from 6.8 up to 7.3%.  Ideal would be A1c less than 7% as long as there is no hypoglycemia.  Any hypoglycemic episodes would mimic TIAs.  Adjust diabetic management if there is further progression of the A1c.

## 2024-02-01 NOTE — Patient Instructions (Signed)
 See assessment and plan under each diagnosis in the problem list and acutely for this visit

## 2024-02-01 NOTE — Progress Notes (Signed)
 NURSING HOME LOCATION:  [facility] ROOM NUMBER:  [id number]  CODE STATUS:  DNR  PCP: Landy Barnie RAMAN, NP   This is a nursing facility follow up visit of chronic medical diagnoses to document compliance with Regulation 483.30 (c) in The Long Term Care Survey Manual Phase 2 which mandates caregiver visit ( visits can alternate among physician, PA or NP as per statutes) within 10 days of 30 days / 60 days/ 90 days post admission to SNF date  .  Interim medical record and care since last SNF visit was updated with review of diagnostic studies and change in clinical status since last visit were documented.  HPI: He is a permanent residence of this facility with medical diagnoses of diabetes with vascular complications; history of stroke complicated by hemiplegia/hemiparesis; osteoarthritis; hypothyroidism; essential hypertension; GERD; aortic atherosclerosis; CKD stage III; vascular dementia; and osteoporosis.  Labs are current as of 12/07/2023.  There is been minimal progression of the CKD with creatinine rising from 1.50 up to 1.58 and corresponding decrease in GFR from 46-44.  This would be compatible with high stage IIIb CKD.  Hypercalcemia has improved with calcium  rising from 8.4-8.8.  Albumin has dropped from 2.9-2.7 but total protein is essentially stable and actually rising from 5.9-6.0.  Normochromic, normocytic anemia has improved with H/H rising from 11.5/35.6 up to 12.3/37.5.  Diabetic control has decreased slightly with A1c rising from 6.8 to 7.3%.  Review of systems: Dementia invalidated responses.  He states that he is doing fine.  He did state that he had to be careful eating to avoid choking.SABRA  He validates anxiety depression because I am stuck here and bored.  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 He states he is anxious to return home where he lives with his brother and his parents.  When I asked his parents'  ages; he stated that his mother was 56 and his father 60.  He then gave his mother's birthdate as 28 and his father's as 92 which would have made them 105 & 106 respectively.  Constitutional: No fever, significant weight change, fatigue  Eyes: No redness, discharge, pain, vision change ENT/mouth: No nasal congestion,  purulent discharge, earache, change in hearing, sore throat  Cardiovascular: No chest pain, palpitations, paroxysmal nocturnal dyspnea, claudication, edema  Respiratory: No cough, sputum production, hemoptysis, DOE, significant snoring, apnea   Gastrointestinal: No heartburn,  abdominal pain, nausea /vomiting, rectal bleeding, melena, change in bowels Genitourinary: No dysuria, hematuria, pyuria, incontinence, nocturia Musculoskeletal: No joint stiffness, joint swelling, weakness, pain Dermatologic: No rash, pruritus, change in appearance of skin Neurologic: No dizziness, headache, syncope, seizures, numbness, tingling Psychiatric: No significant insomnia, anorexia Endocrine: No change in hair/skin/nails, excessive thirst, excessive hunger, excessive urination  Hematologic/lymphatic: No significant bruising, lymphadenopathy, abnormal bleeding Allergy/immunology: No itchy/watery eyes, significant sneezing, urticaria, angioedema  Physical exam:  Pertinent or positive findings: He has a full head of starkly white hair.  He is Transport planner.  Eyebrows are decreased in density.  Arcus senilis is present.  Teeth are coated.  He has severe mixed arthritic changes of the hands with contractures and deviations of the digits.  There is 1/2+ edema at the sock line.  Pedal pulses are not palpable.  There is slight interosseous wasting.  General appearance: no acute distress, increased work of breathing is present.   Lymphatic: No lymphadenopathy about the head, neck, axilla. Eyes: No conjunctival inflammation or lid edema  is present. There is no scleral icterus. Ears:  External ear exam  shows no significant lesions or deformities.   Nose:  External nasal examination shows no deformity or inflammation. Nasal mucosa are pink and moist without lesions, exudates Neck:  No thyromegaly, masses, tenderness noted.    Heart:  Normal rate and regular rhythm. S1 and S2 normal without gallop, murmur, click, rub .  Lungs: Chest clear to auscultation without wheezes, rhonchi, rales, rubs. Abdomen: Bowel sounds are normal. Abdomen is soft and nontender with no organomegaly, hernias, masses. GU: Deferred  Extremities:  No cyanosis, clubbing  Neurologic exam :Balance, Rhomberg, finger to nose testing could not be completed due to clinical state Skin: Warm & dry w/o tenting. No significant lesions or rash.  See summary under each active problem in the Problem List with associated updated therapeutic plan

## 2024-02-01 NOTE — Assessment & Plan Note (Addendum)
  Slight progression in his CKD with creatinine rising from 1.50 up to 1.58 with corresponding decrease in GFR from 46 down to 44.  This would be compatible with CKD high stage IIIb.  Med list reviewed; no change in meds or dosages indicated at this time but continue to monitor.

## 2024-02-07 LAB — HM DIABETES EYE EXAM

## 2024-02-08 ENCOUNTER — Encounter: Payer: Self-pay | Admitting: Adult Health

## 2024-02-08 NOTE — Progress Notes (Signed)
 Location:  [facility] Room Number: 111 Place of Service:  SNF (31)   CODE STATUS: dnr   No Known Allergies  Chief Complaint  Patient presents with   Acute Visit    Care plan meeting     HPI:  We have come together for his care plan meeting.  BIMS 13/15  mood 0/30. Uses wheelchair without valls. He requires dependent assist with his adl care. He is incontinent of bladder and bowel. Dietary: setup for meals; weight is 180.6 pounds; regular diet; appetite 76-100%. Therapy: none at this time. He will continue to be followed for his chronic illnesses including:  Hypertension associated with type 2 diabetes mellitus    Aortic atherosclerosis    GERD without esophagitis  Past Medical History:  Diagnosis Date   Hypertension    Benign Essential   Hypothyroidism    Obesity    Osteoarthritis    PVD (peripheral vascular disease) (HCC)    Type 2 diabetes mellitus with vascular complications     Past Surgical History:  Procedure Laterality Date   APPENDECTOMY     TONSILLECTOMY      Social History   Socioeconomic History   Marital status: Single    Spouse name: Not on file   Number of children: Not on file   Years of education: 12   Highest education level: Not on file  Occupational History   Occupation: RETIRED  Tobacco Use   Smoking status: Former   Smokeless tobacco: 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 Used  Substance and Sexual Activity   Alcohol use: Not Currently   Drug use: Never   Sexual activity: Not Currently  Other Topics Concern   Not on file  Social History Narrative   Not on file   Social Drivers of Health   Financial Resource Strain: Not on file  Food Insecurity: Not on file  Transportation Needs: Not on file  Physical Activity: Not on file  Stress: Not on file  Social Connections: Not on file  Intimate Partner Violence: Not on file   Family History  Problem Relation Age of Onset   Osteoporosis Mother     Hypertension Mother    Cancer Mother    Heart attack Father    Heart attack Maternal Grandmother    Pneumonia Maternal Grandfather    Heart disease Paternal Grandmother    Heart disease Paternal Grandfather       VITAL SIGNS BP 115/61   Pulse 61   Temp (!) 97.5 F (36.4 C)   Resp 18   Ht 6' (1.829 m)   Wt 179 lb (81.2 kg)   SpO2 98%   BMI 24.28 kg/m   Outpatient Encounter Medications as of 02/09/2024  Medication Sig   aspirin  EC 81 MG tablet Take 162 mg by mouth daily. Swallow whole.   atorvastatin  (LIPITOR) 40 MG tablet Take 40 mg by mouth daily.   calcium -vitamin D (OSCAL WITH D) 500-5 MG-MCG tablet Take 1 tablet by mouth daily.   fluticasone (FLONASE) 50 MCG/ACT nasal spray Place 1 spray into both nostrils 2 (two) times daily as needed.   Infant Foods (PROTEIN FORTIFIER) LIQD Take 60 mLs by mouth daily. In the morning, any brand   levothyroxine  (SYNTHROID ) 75 MCG tablet Take 75 mcg by mouth daily before breakfast.   MEMANTINE HCL PO Take 7 mg by mouth daily.   NON FORMULARY Diet:Regular Diet with Thin Liquids   Nutritional Supplements (,FEEDING SUPPLEMENT, PROSOURCE  PLUS) liquid Take 30 mLs by mouth at bedtime. 9 am, 2 pm, and 9 pm   omeprazole (PRILOSEC OTC) 20 MG tablet Take 20 mg by mouth at bedtime.   Polyvinyl Alcohol-Povidone (ARTIFICIAL TEARS) 5-6 MG/ML SOLN Apply 1 drop to eye every hour as needed. Right eye   sertraline (ZOLOFT) 50 MG tablet Take 50 mg by mouth daily.   triamcinolone cream (KENALOG) 0.1 % Apply 1 Application topically 2 (two) times daily as needed.   No facility-administered encounter medications on file as of 02/09/2024.     SIGNIFICANT DIAGNOSTIC EXAMS  LABS REVIEWED: PREVIOUS   02-05-23: wbc 9.0; hgb 12.8; hct 38.5; mcv 95.5 plt 205; glucose 149; bun 42; creat 1.71; k+ 3.9; na++ 133; ca 8.3 gfr 40; d-dimer 0.45 CRP 3.6 02-06-23: wbc 6.9; hgb 11.8; hct 35.7; mcv 94.2 plt 185; glucose 121; bun 38; creat 1.41; k+ 3.7; na++ 134; ca 8.1; gfr 51;  protein 5.9 albumin 2.9 tsh 1.528 03-17-23: tsh 2.641 06-23-23: glucose 129; bun 45; creat 1.52; k+ 3.9; na++ 136; ca 8.4; gfr 46    07-13-23: hgb A1c 6.8 urine microalbumin 172.6 07-26-23: wbc 7.5; hgb 11.5; hct 35.6; mcv 97.0 plt 221 09-08-23: tsh 3.137; free t3: 2.1 free t4: 0.91  TODAY  12-07-23: wbc 9.6; hgb 12.3; hct 37.5; mcv 94.2 plt 258; glucose 144; bun 37; creat 1.58; k+ 4.1; na++ 139; ca 8.8 gfr 44; protein 6.0; albumin 2.7; cho, 93 ldl 45; trig 112; hdl 26 hgb A1c 7.3   Review of Systems  Unable to perform ROS: Dementia    Physical Exam Constitutional:      General: He is not in acute distress.    Appearance: He is well-developed. He is not diaphoretic.  Neck:     Thyroid : No thyromegaly.  Cardiovascular:     Rate and Rhythm: Normal rate and regular rhythm.     Heart sounds: Normal heart sounds.  Pulmonary:     Effort: Pulmonary effort is normal. No respiratory distress.     Breath sounds: Normal breath sounds.  Abdominal:     General: Bowel sounds are normal. There is no distension.     Palpations: Abdomen is soft.     Tenderness: There is no abdominal tenderness.  Musculoskeletal:     Cervical back: Neck supple.     Right lower leg: No edema.     Left lower leg: No edema.  Lymphadenopathy:     Cervical: No cervical adenopathy.  Skin:    General: Skin is warm and dry.  Neurological:     Mental Status: He is alert. Mental status is at baseline.     Comments: SLUMS 15/30  Psychiatric:        Mood and Affect: Mood normal.     ASSESSMENT/ PLAN:  TODAY  Hypertension associated with type 2 diabetes mellitus Aortic atherosclerosis GERD without esophagitis  Will continue current medications Will continue current plan of care Will continue to monitor his status.   Time spent with patient: 40 minutes: medications; plan of care; dietary.    Barnie Seip NP [facility]   call [phone]

## 2024-02-09 ENCOUNTER — Non-Acute Institutional Stay (SKILLED_NURSING_FACILITY): Payer: Self-pay | Admitting: Adult Health

## 2024-02-09 DIAGNOSIS — E1159 Type 2 diabetes mellitus with other circulatory complications: Secondary | ICD-10-CM | POA: Diagnosis not present

## 2024-02-09 DIAGNOSIS — I700 Atherosclerosis of aorta: Secondary | ICD-10-CM | POA: Diagnosis not present

## 2024-02-09 DIAGNOSIS — I152 Hypertension secondary to endocrine disorders: Secondary | ICD-10-CM | POA: Diagnosis not present

## 2024-02-09 DIAGNOSIS — K219 Gastro-esophageal reflux disease without esophagitis: Secondary | ICD-10-CM | POA: Diagnosis not present

## 2024-03-12 ENCOUNTER — Encounter: Payer: Self-pay | Admitting: Adult Health

## 2024-03-12 ENCOUNTER — Non-Acute Institutional Stay (SKILLED_NURSING_FACILITY): Payer: Self-pay | Admitting: Adult Health

## 2024-03-12 DIAGNOSIS — Z8673 Personal history of transient ischemic attack (TIA), and cerebral infarction without residual deficits: Secondary | ICD-10-CM | POA: Diagnosis not present

## 2024-03-12 DIAGNOSIS — F32A Depression, unspecified: Secondary | ICD-10-CM

## 2024-03-12 DIAGNOSIS — E039 Hypothyroidism, unspecified: Secondary | ICD-10-CM | POA: Diagnosis not present

## 2024-03-12 NOTE — Progress Notes (Signed)
 Location:  [facility] Room Number: 111/W Place of Service:  SNF (31)   CODE STATUS: DNR  No Known Allergies  Chief Complaint  Patient presents with   Medical Management of Chronic Issues            History of CVA:    Thyroid  disease:    Chronic depression:    HPI:  He is a 78 y.o. long term resident of this facility being seen for the management of his chronic illnesses:   History of CVA:    Thyroid  disease:    Chronic depression. There are no reports of uncontrolled pain. He continues to take his medications as directed; and tolerates them without difficulty. There are no reports of anxiety present.    Past Medical History:  Diagnosis Date   Hypertension    Benign Essential   Hypothyroidism    Obesity    Osteoarthritis    PVD (peripheral vascular disease)    Type 2 diabetes mellitus with vascular complications     Past Surgical History:  Procedure Laterality Date   APPENDECTOMY     TONSILLECTOMY      Social History   Socioeconomic History   Marital status: Single    Spouse name: Not on file   Number of children: Not on file   Years of education: 12   Highest education level: Not on file  Occupational History   Occupation: RETIRED  Tobacco Use   Smoking status: Former   Smokeless tobacco: 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 Used  Substance and Sexual Activity   Alcohol use: Not Currently   Drug use: Never   Sexual activity: Not Currently  Other Topics Concern   Not on file  Social History Narrative   Not on file   Social Drivers of Health   Financial Resource Strain: Not on file  Food Insecurity: Not on file  Transportation Needs: Not on file  Physical Activity: Not on file  Stress: Not on file  Social Connections: Not on file  Intimate Partner Violence: Not on file   Family History  Problem Relation Age of Onset   Osteoporosis Mother    Hypertension Mother    Cancer Mother    Heart attack Father    Heart  attack Maternal Grandmother    Pneumonia Maternal Grandfather    Heart disease Paternal Grandmother    Heart disease Paternal Grandfather       VITAL SIGNS BP (!) 146/68   Pulse 64   Temp (!) 97.2 F (36.2 C)   Resp 18   Ht 6' (1.829 m)   Wt 184 lb (83.5 kg)   SpO2 96%   BMI 24.95 kg/m   Outpatient Encounter Medications as of 03/12/2024  Medication Sig   aspirin  EC 81 MG tablet Take 162 mg by mouth daily. Swallow whole.   atorvastatin  (LIPITOR) 20 MG tablet Take 20 mg by mouth daily.   calcium -vitamin D (OSCAL WITH D) 500-5 MG-MCG tablet Take 1 tablet by mouth daily.   Eyelid Cleansers (OCUSOFT LID SCRUB ORIGINAL EX) Apply 1 Application topically in the morning and at bedtime.   fluticasone (FLONASE) 50 MCG/ACT nasal spray Place 1 spray into both nostrils 2 (two) times daily as needed.   Infant Foods (PROTEIN FORTIFIER) LIQD Take 60 mLs by mouth daily. In the morning, any brand   levothyroxine  (SYNTHROID ) 75 MCG tablet Take 75 mcg by mouth daily before breakfast.   MEMANTINE HCL PO Take 7  mg by mouth daily.   NON FORMULARY Diet:Regular Diet with Thin Liquids   Nutritional Supplements (,FEEDING SUPPLEMENT, PROSOURCE PLUS) liquid Take 30 mLs by mouth at bedtime. 9 am, 2 pm, and 9 pm   omeprazole (PRILOSEC OTC) 20 MG tablet Take 20 mg by mouth at bedtime.   Polyvinyl Alcohol-Povidone (ARTIFICIAL TEARS) 5-6 MG/ML SOLN Apply 1 drop to eye every hour as needed. Right eye   sertraline (ZOLOFT) 50 MG tablet Take 50 mg by mouth daily.   triamcinolone cream (KENALOG) 0.1 % Apply 1 Application topically 2 (two) times daily as needed.   [DISCONTINUED] atorvastatin  (LIPITOR) 40 MG tablet Take 40 mg by mouth daily.   No facility-administered encounter medications on file as of 03/12/2024.     SIGNIFICANT DIAGNOSTIC EXAMS  LABS REVIEWED: PREVIOUS   03-17-23: tsh 2.641 06-23-23: glucose 129; bun 45; creat 1.52; k+ 3.9; na++ 136; ca 8.4; gfr 46    07-13-23: hgb A1c 6.8 urine microalbumin  172.6 07-26-23: wbc 7.5; hgb 11.5; hct 35.6; mcv 97.0 plt 221 09-08-23: tsh 3.137; free t3: 2.1 free t4: 0.91 12-07-23: wbc 9.6; hgb 12.3; hct 37.5; mcv 94.2 plt 258; glucose 144; bun 37; creat 1.58; k+ 4.1; na++ 139; ca 8.8 gfr 44; protein 6.0; albumin 2.7; cho, 93 ldl 45; trig 112; hdl 26 hgb A1c 7.3   NO NEW LABS.    Review of Systems  Unable to perform ROS: Dementia    Physical Exam Constitutional:      General: He is not in acute distress.    Appearance: He is well-developed. He is not diaphoretic.  Neck:     Thyroid : No thyromegaly.  Cardiovascular:     Rate and Rhythm: Normal rate and regular rhythm.     Pulses: Normal pulses.     Heart sounds: Normal heart sounds.  Pulmonary:     Effort: Pulmonary effort is normal. No respiratory distress.     Breath sounds: Normal breath sounds.  Abdominal:     General: Bowel sounds are normal. There is no distension.     Palpations: Abdomen is soft.     Tenderness: There is no abdominal tenderness.  Musculoskeletal:     Cervical back: Neck supple.     Right lower leg: No edema.     Left lower leg: No edema.  Lymphadenopathy:     Cervical: No cervical adenopathy.  Skin:    General: Skin is warm and dry.  Neurological:     Mental Status: He is alert. Mental status is at baseline.     Comments: SLUMS 15/30  Psychiatric:        Mood and Affect: Mood normal.     ASSESSMENT/ PLAN:  TODAY  History of CVA: is on asa 162 mg daily is off plavix   2. Thyroid  disease: tsh 3.137 will continue synthroid  75 mcg daily   3. Chronic depression: is on zoloft 50 mg daily mood state is stable.   PREVIOUS   4. Hemiplegia and hemiparesis following cerebral infarction unspecified site: in on asa 162 mg daily   5. Vascular dementia without behavioral disturbance; psychotic disturbance; mood disturbance, anxiety. Weight is 184 pounds without significant change will continue namenda 7 mg xr daily   6. CKD stage 3 due to type 2 diabetes  mellitus (HCC) bun 37; creat 1.58; gfr 44  7. Moderate protein malnutrition (HCC) albumin 2.7 will continue supplements as directed  8. Hyperlipidemia associated with type 2 diabetes mellitus (HCC) ldl 45 will  lower lipitor to  20 mg daily   9. Type 2 diabetes mellitus with neurological manifestations: hgb A1c 7.3; is on asa and statin  10. Osteoporosis: t score -2.863  11. Hypertension associated with type 2 diabetes mellitus: b/p 146/68 is on asa will monitor   12. Hyperlipidemia associated with type 2 diabetes mellitus: ldl 45 will continue  lipitor  20 mg daily   13. Chronic constipation: will continue senna s 2 tabs twice daily as needed   14. GERD without esophagitis: will continue prilosec 20 mg daily      Barnie Seip NP [facility]   call [phone]

## 2024-03-18 ENCOUNTER — Other Ambulatory Visit (HOSPITAL_COMMUNITY)
Admission: RE | Admit: 2024-03-18 | Discharge: 2024-03-18 | Disposition: A | Discharge status: Home / Self Care | Source: Skilled Nursing Facility | Attending: Adult Health | Admitting: Adult Health

## 2024-03-18 DIAGNOSIS — E039 Hypothyroidism, unspecified: Secondary | ICD-10-CM | POA: Diagnosis present

## 2024-03-18 LAB — TSH: TSH: 2.175 u[IU]/mL (ref 0.350–4.500)

## 2024-03-21 ENCOUNTER — Other Ambulatory Visit (HOSPITAL_COMMUNITY)
Admission: RE | Admit: 2024-03-21 | Discharge: 2024-03-21 | Disposition: A | Discharge status: Home / Self Care | Source: Skilled Nursing Facility | Attending: Adult Health | Admitting: Adult Health

## 2024-03-21 DIAGNOSIS — E083393 Diabetes mellitus due to underlying condition with moderate nonproliferative diabetic retinopathy without macular edema, bilateral: Secondary | ICD-10-CM | POA: Diagnosis present

## 2024-03-21 DIAGNOSIS — E113393 Type 2 diabetes mellitus with moderate nonproliferative diabetic retinopathy without macular edema, bilateral: Secondary | ICD-10-CM | POA: Insufficient documentation

## 2024-03-21 LAB — HEMOGLOBIN A1C
Hgb A1c MFr Bld: 6.4 % — ABNORMAL HIGH (ref 4.8–5.6)
Mean Plasma Glucose: 136.98 mg/dL

## 2024-04-09 ENCOUNTER — Non-Acute Institutional Stay (SKILLED_NURSING_FACILITY): Payer: Self-pay | Admitting: Adult Health

## 2024-04-09 ENCOUNTER — Encounter: Payer: Self-pay | Admitting: Adult Health

## 2024-04-09 DIAGNOSIS — E1122 Type 2 diabetes mellitus with diabetic chronic kidney disease: Secondary | ICD-10-CM

## 2024-04-09 DIAGNOSIS — F01C0 Vascular dementia, severe, without behavioral disturbance, psychotic disturbance, mood disturbance, and anxiety: Secondary | ICD-10-CM | POA: Diagnosis not present

## 2024-04-09 DIAGNOSIS — I69359 Hemiplegia and hemiparesis following cerebral infarction affecting unspecified side: Secondary | ICD-10-CM | POA: Diagnosis not present

## 2024-04-09 DIAGNOSIS — N183 Chronic kidney disease, stage 3 unspecified: Secondary | ICD-10-CM | POA: Diagnosis not present

## 2024-04-09 NOTE — Progress Notes (Signed)
 Location:  [facility] Room Number: 111 Place of Service:  SNF (31)   CODE STATUS: dnr   No Known Allergies  Chief Complaint  Patient presents with   Medical Management of Chronic Issues            Hemiplegia and hemiparesis following cerebral infarction unspecified site:  Vascular dementia without behavioral disturbance, psychotic disturbance, mood disturbance, anxiety.  CKD stage 3 due to type 2 diabetes mellitus    HPI:  He is a 78 y.o. long term resident of this facility being seen for the management of his chronic illnesses: Hemiplegia and hemiparesis following cerebral infarction unspecified site:  Vascular dementia without behavioral disturbance, psychotic disturbance, mood disturbance, anxiety.  CKD stage 3 due to type 2 diabetes mellitus. There are no reports of uncontrolled pain. No reports of anxiety or agitation.    Past Medical History:  Diagnosis Date   Hypertension    Benign Essential   Hypothyroidism    Obesity    Osteoarthritis    PVD (peripheral vascular disease)    Type 2 diabetes mellitus with vascular complications     Past Surgical History:  Procedure Laterality Date   APPENDECTOMY     TONSILLECTOMY      Social History   Socioeconomic History   Marital status: Single    Spouse name: Not on file   Number of children: Not on file   Years of education: 12   Highest education level: Not on file  Occupational History   Occupation: RETIRED  Tobacco Use   Smoking status: Former   Smokeless tobacco: 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 Used  Substance and Sexual Activity   Alcohol use: Not Currently   Drug use: Never   Sexual activity: Not Currently  Other Topics Concern   Not on file  Social History Narrative   Not on file   Social Drivers of Health   Financial Resource Strain: Not on file  Food Insecurity: Not on file  Transportation Needs: Not on file  Physical Activity: Not on file  Stress: Not on file   Social Connections: Not on file  Intimate Partner Violence: Not on file   Family History  Problem Relation Age of Onset   Osteoporosis Mother    Hypertension Mother    Cancer Mother    Heart attack Father    Heart attack Maternal Grandmother    Pneumonia Maternal Grandfather    Heart disease Paternal Grandmother    Heart disease Paternal Grandfather       VITAL SIGNS BP 118/68   Pulse 74   Temp (!) 97.1 F (36.2 C)   Resp 20   Ht 6' (1.829 m)   Wt 184 lb (83.5 kg)   SpO2 97%   BMI 24.95 kg/m   Outpatient Encounter Medications as of 04/09/2024  Medication Sig   sertraline (ZOLOFT) 25 MG tablet Take 25 mg by mouth daily.   aspirin  EC 81 MG tablet Take 162 mg by mouth daily. Swallow whole.   atorvastatin  (LIPITOR) 20 MG tablet Take 20 mg by mouth daily.   calcium -vitamin D (OSCAL WITH D) 500-5 MG-MCG tablet Take 1 tablet by mouth daily.   Eyelid Cleansers (OCUSOFT LID SCRUB ORIGINAL EX) Apply 1 Application topically in the morning and at bedtime.   fluticasone (FLONASE) 50 MCG/ACT nasal spray Place 1 spray into both nostrils 2 (two) times daily as needed.   levothyroxine  (SYNTHROID ) 75 MCG tablet Take 75 mcg  by mouth daily before breakfast.   MEMANTINE HCL PO Take 7 mg by mouth daily.   NON FORMULARY Diet:Regular Diet with Thin Liquids   omeprazole (PRILOSEC OTC) 20 MG tablet Take 20 mg by mouth at bedtime.   Polyvinyl Alcohol-Povidone (ARTIFICIAL TEARS) 5-6 MG/ML SOLN Apply 1 drop to eye every hour as needed. Right eye   triamcinolone cream (KENALOG) 0.1 % Apply 1 Application topically 2 (two) times daily as needed.   No facility-administered encounter medications on file as of 04/09/2024.     SIGNIFICANT DIAGNOSTIC EXAMS  LABS REVIEWED: PREVIOUS   06-23-23: glucose 129; bun 45; creat 1.52; k+ 3.9; na++ 136; ca 8.4; gfr 46    07-13-23: hgb A1c 6.8 urine microalbumin 172.6 07-26-23: wbc 7.5; hgb 11.5; hct 35.6; mcv 97.0 plt 221 09-08-23: tsh 3.137; free t3: 2.1 free  t4: 0.91 12-07-23: wbc 9.6; hgb 12.3; hct 37.5; mcv 94.2 plt 258; glucose 144; bun 37; creat 1.58; k+ 4.1; na++ 139; ca 8.8 gfr 44; protein 6.0; albumin 2.7; cho, 93 ldl 45; trig 112; hdl 26 hgb A1c 7.3   TODAY  03-18-24: tsh 2.175 03-21-24: hgb A1c 6.4     Review of Systems  Unable to perform ROS: Dementia    Physical Exam Constitutional:      General: He is not in acute distress.    Appearance: He is well-developed. He is not diaphoretic.  Neck:     Thyroid : No thyromegaly.  Cardiovascular:     Rate and Rhythm: Normal rate and regular rhythm.     Heart sounds: Normal heart sounds.  Pulmonary:     Effort: Pulmonary effort is normal. No respiratory distress.     Breath sounds: Normal breath sounds.  Abdominal:     General: Bowel sounds are normal. There is no distension.     Palpations: Abdomen is soft.     Tenderness: There is no abdominal tenderness.  Musculoskeletal:     Cervical back: Neck supple.     Right lower leg: No edema.     Left lower leg: No edema.  Lymphadenopathy:     Cervical: No cervical adenopathy.  Skin:    General: Skin is warm and dry.  Neurological:     Mental Status: He is alert. Mental status is at baseline.     Comments: SLUMS 15/30  Psychiatric:        Mood and Affect: Mood normal.     ASSESSMENT/ PLAN:  TODAY  Hemiplegia and hemiparesis following cerebral infarction unspecified site: is on asa 162 mg daily  2. Vascular dementia without behavioral disturbance, psychotic disturbance, mood disturbance, anxiety. Weight is 184 pounds; without significant change in weight; will continue namenda 7 mg daily   3. CKD stage 3 due to type 2 diabetes mellitus: bun 27; creat 1.58 gfr 44   PREVIOUS   4. Moderate protein malnutrition (HCC) albumin 2.7 will continue supplements as directed  5. Hyperlipidemia associated with type 2 diabetes mellitus (HCC) ldl 45 will continue lipitor  20 mg daily   6. Type 2 diabetes mellitus with neurological  manifestations: hgb A1c 6.4; is on asa and statin  7. Osteoporosis: t score -2.863  8. Hypertension associated with type 2 diabetes mellitus: b/p 118/68 is on asa will monitor   9. Hyperlipidemia associated with type 2 diabetes mellitus: ldl 45 will continue  lipitor  20 mg daily   10. Chronic constipation: will monitor  11. GERD without esophagitis: will continue prilosec 20 mg daily   12.  History of CVA: is on asa 162 mg daily is off plavix   13. Thyroid  disease: tsh 2.175 will continue synthroid  75 mcg daily   14. Chronic depression: is on zoloft 25 mg daily mood state is stable.      Barnie Seip NP [facility]  call [phone]

## 2024-05-03 ENCOUNTER — Encounter: Payer: Self-pay | Admitting: Adult Health

## 2024-05-03 ENCOUNTER — Encounter: Payer: Self-pay | Admitting: Internal Medicine

## 2024-05-03 ENCOUNTER — Non-Acute Institutional Stay (SKILLED_NURSING_FACILITY): Payer: Self-pay | Admitting: Internal Medicine

## 2024-05-03 DIAGNOSIS — E1149 Type 2 diabetes mellitus with other diabetic neurological complication: Secondary | ICD-10-CM

## 2024-05-03 DIAGNOSIS — E782 Mixed hyperlipidemia: Secondary | ICD-10-CM | POA: Diagnosis not present

## 2024-05-03 DIAGNOSIS — N1831 Chronic kidney disease, stage 3a: Secondary | ICD-10-CM | POA: Diagnosis not present

## 2024-05-03 DIAGNOSIS — I10 Essential (primary) hypertension: Secondary | ICD-10-CM

## 2024-05-03 NOTE — Patient Instructions (Signed)
 See assessment and plan under each diagnosis in the problem list and acutely for this visit

## 2024-05-03 NOTE — Progress Notes (Signed)
 This encounter was created in error - please disregard.

## 2024-05-03 NOTE — Assessment & Plan Note (Signed)
 LDL is 45, at goal of less than 70.  Monitor annually.
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
## 2024-05-03 NOTE — Assessment & Plan Note (Signed)
 Serially the GFR is stable to slightly improved.  Med list reviewed; no change indicated in meds or dosages at this time.

## 2024-05-03 NOTE — Progress Notes (Signed)
 Location:  [facility] Room Number: 111 W Place of Service:  SNF (31)   CODE STATUS: DNR  No Known Allergies  Chief Complaint  Patient presents with   Care Plan Meeting    HPI:    Past Medical History:  Diagnosis Date   Hypertension    Benign Essential   Hypothyroidism    Obesity    Osteoarthritis    PVD (peripheral vascular disease)    Type 2 diabetes mellitus with vascular complications     Past Surgical History:  Procedure Laterality Date   APPENDECTOMY     TONSILLECTOMY      Social History   Socioeconomic History   Marital status: Single    Spouse name: Not on file   Number of children: Not on file   Years of education: 12   Highest education level: Not on file  Occupational History   Occupation: RETIRED  Tobacco Use   Smoking status: Former   Smokeless tobacco: Never  Advertising Account Planner   Vaping status: Never Used  Substance and Sexual Activity   Alcohol use: Not Currently   Drug use: Never   Sexual activity: Not Currently  Other Topics Concern   Not on file  Social History Narrative   Not on file   Social Drivers of Health   Financial Resource Strain: Not on file  Food Insecurity: Not on file  Transportation Needs: Not on file  Physical Activity: Not on file  Stress: Not on file  Social Connections: Not on file  Intimate Partner Violence: Not on file   Family History  Problem Relation Age of Onset   Osteoporosis Mother    Hypertension Mother    Cancer Mother    Heart attack Father    Heart attack Maternal Grandmother    Pneumonia Maternal Grandfather    Heart disease Paternal Grandmother    Heart disease Paternal Grandfather       VITAL SIGNS BP 106/68   Pulse 84   Temp 98.1 F (36.7 C)   Resp 18   Ht 6' (1.829 m)   Wt 188 lb 6.4 oz (85.5 kg)   SpO2 97%   BMI 25.55 kg/m   Outpatient Encounter Medications as of 05/03/2024  Medication Sig   aspirin  EC 81 MG tablet Take 162 mg by mouth daily. Swallow  whole.   atorvastatin  (LIPITOR) 20 MG tablet Take 20 mg by mouth daily.   calcium -vitamin D (OSCAL WITH D) 500-5 MG-MCG tablet Take 1 tablet by mouth daily.   Eyelid Cleansers (OCUSOFT LID SCRUB ORIGINAL EX) Apply 1 Application topically in the morning and at bedtime.   fluticasone (FLONASE) 50 MCG/ACT nasal spray Place 1 spray into both nostrils 2 (two) times daily as needed.   levothyroxine  (SYNTHROID ) 75 MCG tablet Take 75 mcg by mouth daily before breakfast.   MEMANTINE HCL PO Take 7 mg by mouth daily.   NON FORMULARY Diet:Regular Diet with Thin Liquids   omeprazole (PRILOSEC OTC) 20 MG tablet Take 20 mg by mouth at bedtime.   Polyvinyl Alcohol-Povidone (ARTIFICIAL TEARS) 5-6 MG/ML SOLN Apply 1 drop to eye every hour as needed. Right eye   sertraline (ZOLOFT) 25 MG tablet Take 25 mg by mouth daily.   triamcinolone cream (KENALOG) 0.1 % Apply 1 Application topically 2 (two) times daily as needed.   No facility-administered encounter medications on file as of 05/03/2024.     SIGNIFICANT DIAGNOSTIC EXAMS       ASSESSMENT/ PLAN:  Barnie Seip NP [facility]  Contact [phone] Monday through Friday 8am- 5pm  After hours call [phone]

## 2024-05-03 NOTE — Assessment & Plan Note (Addendum)
 Current A1c is 6.4%, improved from prior value of 7.3%.  No change indicated unless he is experiencing hypoglycemia which would necessitate adjusting the diabetic regimen.  Minimal A1c goal is less than 8; less than 7 is ideal as long as there is no hypoglycemia. Continue to monitor

## 2024-05-03 NOTE — Assessment & Plan Note (Addendum)
 Blood pressure continues to be soft without antihypertensive medications.  If this persists; hypertension will be listed as resolved.

## 2024-05-27 ENCOUNTER — Non-Acute Institutional Stay (SKILLED_NURSING_FACILITY): Payer: Self-pay | Admitting: Adult Health

## 2024-05-27 ENCOUNTER — Encounter: Payer: Self-pay | Admitting: Adult Health

## 2024-05-27 DIAGNOSIS — Z0000 Encounter for general adult medical examination without abnormal findings: Secondary | ICD-10-CM | POA: Diagnosis not present

## 2024-05-27 NOTE — Progress Notes (Signed)
 Chief Complaint  Patient presents with   Medicare Wellness    Annual wellness visit      Subjective:   Jason Cox is a 78 y.o. male who presents for a Medicare Annual Wellness Visit.  Allergies (verified) Patient has no known allergies.   History: Past Medical History:  Diagnosis Date   Hypertension    Benign Essential   Hypothyroidism    Obesity    Osteoarthritis    PVD (peripheral vascular disease)    Type 2 diabetes mellitus with vascular complications    Past Surgical History:  Procedure Laterality Date   APPENDECTOMY     TONSILLECTOMY     Family History  Problem Relation Age of Onset   Osteoporosis Mother    Hypertension Mother    Cancer Mother    Heart attack Father    Heart attack Maternal Grandmother    Pneumonia Maternal Grandfather    Heart disease Paternal Grandmother    Heart disease Paternal Grandfather    Social History   Occupational History   Occupation: RETIRED  Tobacco Use   Smoking status: Former   Smokeless tobacco: Never  Advertising Account Planner   Vaping status: Never Used  Substance and Sexual Activity   Alcohol use: Not Currently   Drug use: Never   Sexual activity: Not Currently   Tobacco Counseling Counseling given: Not Answered  SDOH Screenings   Depression (PHQ2-9): Low Risk  (05/27/2024)  Tobacco Use: Medium Risk (05/27/2024)   See flowsheets for full screening details  Depression Screen Depression Screening Exception Documentation Depression Screening Exception:: Other- indicate reason in comment box Depression Screening Exception Comment:: unable to fully participate  PHQ 2 & 9 Depression Scale- Over the past 2 weeks, how often have you been bothered by any of the following problems? Little interest or pleasure in doing things: 0 Feeling down, depressed, or hopeless (PHQ Adolescent also includes...irritable): 0 PHQ-2 Total Score: 0     Goals Addressed             This Visit's Progress    Absence of Fall and  Fall-Related Injury   On track    Evidence-based guidance:  Assess fall risk using a validated tool when available. Consider balance and gait impairment, muscle weakness, diminished vision or hearing, environmental hazards, presence of urinary or bowel urgency and/or incontinence.  Communicate fall injury risk to interprofessional healthcare team.  Develop a fall prevention plan with the patient and family.  Promote use of personal vision and auditory aids.  Promote reorientation, appropriate sensory stimulation, and routines to decrease risk of fall when changes in mental status are present.  Assess assistance level required for safe and effective self-care; consider referral for home care.  Encourage physical activity, such as performance of self-care at highest level of ability, strength and balance exercise program, and provision of appropriate assistive devices; refer to rehabilitation therapy.  Refer to community-based fall prevention program where available.  If fall occurs, determine the cause and revise fall injury prevention plan.  Regularly review medication contribution to fall risk; consider risk related to polypharmacy and age.  Refer to pharmacist for consultation when concerns about medications are revealed.  Balance adequate pain management with potential for oversedation.  Provide guidance related to environmental modifications.  Consider supplementation with Vitamin D.   Notes:      Follow up with Primary Care Provider   On track    General - Client will not be readmitted within 30 days (C-SNP)   On  track      Visit info / Clinical Intake: Medicare Wellness Visit Type:: Subsequent Annual Wellness Visit Persons participating in visit:: patient Medicare Wellness Visit Mode:: In-person (required for WTM) Information given by:: patient Interpreter Needed?: No Pre-visit prep was completed: yes AWV questionnaire completed by patient prior to visit?: no Living  arrangements:: in nursing facility Patient's Overall Health Status Rating: (!) fair Typical amount of pain: none Does pain affect daily life?: no Are you currently prescribed opioids?: no  Dietary Habits and Nutritional Risks How many meals a day?: 3 Eats fruit and vegetables daily?: yes Most meals are obtained by: having others provide food Diabetic:: (!) yes Any non-healing wounds?: no How often do you check your BS?: continuous glucose monitor Would you like to be referred to a Nutritionist or for Diabetic Management? : no  Functional Status Activities of Daily Living (to include ambulation/medication): (!) Dependent Feeding: Needs assistance Dressing/Grooming: Needs assistance Bathing: Needs assistance Toileting: Needs assistance Transfer: Needs assistance Ambulation: Independent with device- listed below Home Assistive Devices/Equipment: Wheelchair Medication Administration: Dependent Is this a change from baseline?: Pre-admission baseline Home Management: Dependent Manage your own finances?: (!) no Primary transportation is: facility / other Concerns about vision?: no *vision screening is required for WTM* Concerns about hearing?: no  Fall Screening Falls in the past year?: 0 Number of falls in past year: 0 Was there an injury with Fall?: 0 Fall Risk Category Calculator: 0 Patient Fall Risk Level: Low Fall Risk  Fall Risk Patient at Risk for Falls Due to: Impaired balance/gait; Impaired mobility  Home and Transportation Safety: All rugs have non-skid backing?: N/A, no rugs All stairs or steps have railings?: yes Grab bars in the bathtub or shower?: yes Have non-skid surface in bathtub or shower?: yes Good home lighting?: yes Regular seat belt use?: -- (n/a) Hospital stays in the last year:: no  Cognitive Assessment Difficulty concentrating, remembering, or making decisions? : yes Will 6CIT or Mini Cog be Completed: no 6CIT or Mini Cog Declined: patient has  a diagnosis of dementia or cognitive impairment  Advance Directives (For Healthcare) Does Patient Have a Medical Advance Directive?: Yes Does patient want to make changes to medical advance directive?: No - Patient declined Type of Advance Directive: Out of facility DNR (pink MOST or yellow form) Out of facility DNR (pink MOST or yellow form) in Chart? (Ambulatory ONLY): Yes - validated most recent copy scanned in chart Pre-existing out of facility DNR order (yellow form or pink MOST form): Yellow form placed in chart (order not valid for inpatient use)  Reviewed/Updated  Reviewed/Updated: Reviewed All (Medical, Surgical, Family, Medications, Allergies, Care Teams, Patient Goals)        Objective:    Today's Vitals   05/27/24 0944 05/27/24 0947  BP: (S) (!) 155/77 (!) 127/57  Pulse: 88   Resp: 19   Temp: (!) 97.4 F (36.3 C)   SpO2: 96%   Weight: 188 lb 6.4 oz (85.5 kg)   Height: 6' (1.829 m)    Body mass index is 25.55 kg/m.  Current Medications (verified) Outpatient Encounter Medications as of 05/27/2024  Medication Sig   aspirin  EC 81 MG tablet Take 162 mg by mouth daily. Swallow whole.   atorvastatin  (LIPITOR) 20 MG tablet Take 20 mg by mouth daily.   calcium -vitamin D (OSCAL WITH D) 500-5 MG-MCG tablet Take 1 tablet by mouth daily.   Eyelid Cleansers (OCUSOFT LID SCRUB ORIGINAL EX) Apply 1 Application topically in the morning and at bedtime.  feeding supplement (ENSURE ENLIVE / ENSURE PLUS) LIQD Take 237 mLs by mouth daily. 9 am,  may replace with alternative brand to provide liquid nutritional supplement   fluticasone (FLONASE) 50 MCG/ACT nasal spray Place 1 spray into both nostrils 2 (two) times daily as needed.   levothyroxine  (SYNTHROID ) 75 MCG tablet Take 75 mcg by mouth daily before breakfast.   MEMANTINE HCL PO Take 7 mg by mouth every 12 (twelve) hours. 9 am and 9 pm   NON FORMULARY Diet:Regular Diet with Thin Liquids   Nutritional Supplements (PROSOURCE) LIQD  Take 30 mLs by mouth at bedtime.   omeprazole (PRILOSEC OTC) 20 MG tablet Take 20 mg by mouth at bedtime.   Polyvinyl Alcohol-Povidone (ARTIFICIAL TEARS) 5-6 MG/ML SOLN Apply 1 drop to eye every hour as needed. Right eye   sertraline (ZOLOFT) 25 MG tablet Take 25 mg by mouth daily.   triamcinolone cream (KENALOG) 0.1 % Apply 1 Application topically 3 (three) times daily as needed.   No facility-administered encounter medications on file as of 05/27/2024.   Hearing/Vision screen No results found. Immunizations and Health Maintenance Health Maintenance  Topic Date Due   Diabetic kidney evaluation - Urine ACR  07/12/2024   HEMOGLOBIN A1C  09/18/2024   COVID-19 Vaccine (8 - 2025-26 season) 10/09/2024   Medicare Annual Wellness (AWV)  10/16/2024   Diabetic kidney evaluation - eGFR measurement  12/06/2024   FOOT EXAM  12/10/2024   OPHTHALMOLOGY EXAM  02/06/2025   DTaP/Tdap/Td (2 - Td or Tdap) 04/29/2031   Pneumococcal Vaccine: 50+ Years  Completed   Influenza Vaccine  Completed   Zoster Vaccines- Shingrix  Completed   Meningococcal B Vaccine  Aged Out   Hepatitis C Screening  Discontinued        Assessment/Plan:  This is a routine wellness examination for Elsie.  Patient Care Team: Landy Barnie RAMAN, NP as PCP - General (Geriatric Medicine) Bertell Satterfield, MD as Referring Physician (Internal Medicine)  I have personally reviewed and noted the following in the patient's chart:   Medical and social history Use of alcohol, tobacco or illicit drugs  Current medications and supplements including opioid prescriptions. Functional ability and status Nutritional status Physical activity Advanced directives List of other physicians Hospitalizations, surgeries, and ER visits in previous 12 months Vitals Screenings to include cognitive, depression, and falls Referrals and appointments  Orders Placed This Encounter  Procedures   HM DIABETES FOOT EXAM    This external order was  created through the Results Console.   In addition, I have reviewed and discussed with patient certain preventive protocols, quality metrics, and best practice recommendations. A written personalized care plan for preventive services as well as general preventive health recommendations were provided to patient.   Barnie RAMAN Landy, NP   05/27/2024   No follow-ups on file.  After Visit Summary: (In Person-Declined) Patient declined AVS at this time.  Nurse Notes: the exam was performed by myself at facility

## 2024-05-27 NOTE — Patient Instructions (Signed)
 Mr. Nylund,  Thank you for taking the time for your Medicare Wellness Visit. I appreciate your continued commitment to your health goals. Please review the care plan we discussed, and feel free to reach out if I can assist you further.  Please note that Annual Wellness Visits do not include a physical exam. Some assessments may be limited, especially if the visit was conducted virtually. If needed, we may recommend an in-person follow-up with your provider.  Ongoing Care Seeing your primary care provider every 3 to 6 months helps us  monitor your health and provide consistent, personalized care.   Referrals If a referral was made during today's visit and you haven't received any updates within two weeks, please contact the referred provider directly to check on the status.  Recommended Screenings:  Health Maintenance  Topic Date Due   Yearly kidney health urinalysis for diabetes  07/12/2024   Hemoglobin A1C  09/18/2024   COVID-19 Vaccine (8 - 2025-26 season) 10/09/2024   Medicare Annual Wellness Visit  10/16/2024   Yearly kidney function blood test for diabetes  12/06/2024   Complete foot exam   12/10/2024   Eye exam for diabetics  02/06/2025   DTaP/Tdap/Td vaccine (2 - Td or Tdap) 04/29/2031   Pneumococcal Vaccine for age over 51  Completed   Flu Shot  Completed   Zoster (Shingles) Vaccine  Completed   Meningitis B Vaccine  Aged Out   Hepatitis C Screening  Discontinued       05/27/2024   12:39 PM  Advanced Directives  Does Patient Have a Medical Advance Directive? Yes  Type of Advance Directive Out of facility DNR (pink MOST or yellow form)    Vision: Annual vision screenings are recommended for early detection of glaucoma, cataracts, and diabetic retinopathy. These exams can also reveal signs of chronic conditions such as diabetes and high blood pressure.  Dental: Annual dental screenings help detect early signs of oral cancer, gum disease, and other conditions linked to  overall health, including heart disease and diabetes.  Please see the attached documents for additional preventive care recommendations.

## 2024-06-05 ENCOUNTER — Non-Acute Institutional Stay: Payer: Self-pay | Admitting: Adult Health

## 2024-06-05 ENCOUNTER — Encounter: Payer: Self-pay | Admitting: Adult Health

## 2024-06-05 DIAGNOSIS — E1149 Type 2 diabetes mellitus with other diabetic neurological complication: Secondary | ICD-10-CM | POA: Diagnosis not present

## 2024-06-05 DIAGNOSIS — E440 Moderate protein-calorie malnutrition: Secondary | ICD-10-CM | POA: Diagnosis not present

## 2024-06-05 DIAGNOSIS — E1169 Type 2 diabetes mellitus with other specified complication: Secondary | ICD-10-CM | POA: Diagnosis not present

## 2024-06-05 DIAGNOSIS — E785 Hyperlipidemia, unspecified: Secondary | ICD-10-CM

## 2024-06-05 NOTE — Progress Notes (Signed)
 Location:  [facility] Room Number: 111 W Place of Service:  SNF (31)   CODE STATUS: DNR  No Known Allergies  Chief Complaint  Patient presents with   Medical Management of Chronic Issues        Moderate protein calorie malnutrition: Hyperlipidemia associated with type 2 diabetes mellitus: Type 2 diabetes mellitus with neurological manifestations    HPI:  He is a 78 y.o. long term resident of this facility being seen for the management of her chronic illnesses:Moderate protein calorie malnutrition: Hyperlipidemia associated with type 2 diabetes mellitus: Type 2 diabetes mellitus with neurological manifestations. There are no reports of uncontrolled pain. His weight is stable. There are no reports of anxiety or depressive thoughts.    Past Medical History:  Diagnosis Date   Hypertension    Benign Essential   Hypothyroidism    Obesity    Osteoarthritis    PVD (peripheral vascular disease)    Type 2 diabetes mellitus with vascular complications     Past Surgical History:  Procedure Laterality Date   APPENDECTOMY     TONSILLECTOMY      Social History   Socioeconomic History   Marital status: Single    Spouse name: Not on file   Number of children: Not on file   Years of education: 12   Highest education level: Not on file  Occupational History   Occupation: RETIRED  Tobacco Use   Smoking status: Former   Smokeless tobacco: Never  Advertising Account Planner   Vaping status: Never Used  Substance and Sexual Activity   Alcohol use: Not Currently   Drug use: Never   Sexual activity: Not Currently  Other Topics Concern   Not on file  Social History Narrative   Long term resident of SNF    Social Drivers of Health   Financial Resource Strain: Not on file  Food Insecurity: Not on file  Transportation Needs: Not on file  Physical Activity: Not on file  Stress: Not on file  Social Connections: Not on file  Intimate Partner Violence: Not on file    Family History  Problem Relation Age of Onset   Osteoporosis Mother    Hypertension Mother    Cancer Mother    Heart attack Father    Heart attack Maternal Grandmother    Pneumonia Maternal Grandfather    Heart disease Paternal Grandmother    Heart disease Paternal Grandfather       VITAL SIGNS BP 110/78   Pulse 74   Temp (!) 97.4 F (36.3 C)   Resp 20   Ht 6' (1.829 m)   Wt 182 lb 6.4 oz (82.7 kg)   SpO2 95%   BMI 24.74 kg/m   Outpatient Encounter Medications as of 06/05/2024  Medication Sig   aspirin  EC 81 MG tablet Take 162 mg by mouth daily. Swallow whole.   atorvastatin  (LIPITOR) 20 MG tablet Take 20 mg by mouth daily.   calcium -vitamin D (OSCAL WITH D) 500-5 MG-MCG tablet Take 1 tablet by mouth daily.   Eyelid Cleansers (OCUSOFT LID SCRUB ORIGINAL EX) Apply 1 Application topically in the morning and at bedtime.   feeding supplement (ENSURE ENLIVE / ENSURE PLUS) LIQD Take 237 mLs by mouth daily. 9 am,  may replace with alternative brand to provide liquid nutritional supplement   fluticasone (FLONASE) 50 MCG/ACT nasal spray Place 1 spray into both nostrils 2 (two) times daily as needed.   levothyroxine  (SYNTHROID ) 75 MCG tablet Take 75 mcg  by mouth daily before breakfast.   MEMANTINE HCL PO Take 7 mg by mouth every 12 (twelve) hours. 9 am and 9 pm   NON FORMULARY Diet:Regular Diet with Thin Liquids   Nutritional Supplements (PROSOURCE) LIQD Take 30 mLs by mouth at bedtime.   omeprazole (PRILOSEC OTC) 20 MG tablet Take 20 mg by mouth at bedtime.   Polyvinyl Alcohol-Povidone (ARTIFICIAL TEARS) 5-6 MG/ML SOLN Apply 1 drop to eye every hour as needed. Right eye   sertraline (ZOLOFT) 25 MG tablet Take 50 mg by mouth daily.   triamcinolone cream (KENALOG) 0.1 % Apply 1 Application topically 3 (three) times daily as needed.   No facility-administered encounter medications on file as of 06/05/2024.     SIGNIFICANT DIAGNOSTIC EXAMS   LABS REVIEWED: PREVIOUS    06-23-23: glucose 129; bun 45; creat 1.52; k+ 3.9; na++ 136; ca 8.4; gfr 46    07-13-23: hgb A1c 6.8 urine microalbumin 172.6 07-26-23: wbc 7.5; hgb 11.5; hct 35.6; mcv 97.0 plt 221 09-08-23: tsh 3.137; free t3: 2.1 free t4: 0.91 12-07-23: wbc 9.6; hgb 12.3; hct 37.5; mcv 94.2 plt 258; glucose 144; bun 37; creat 1.58; k+ 4.1; na++ 139; ca 8.8 gfr 44; protein 6.0; albumin 2.7; cho, 93 ldl 45; trig 112; hdl 26 hgb A1c 7.3   TODAY  03-18-24: tsh 2.175 03-21-24: hgb A1c 6.4    Review of Systems  Unable to perform ROS: Dementia    Physical Exam Constitutional:      General: He is not in acute distress.    Appearance: He is well-developed. He is not diaphoretic.  Neck:     Thyroid : No thyromegaly.  Cardiovascular:     Rate and Rhythm: Normal rate and regular rhythm.     Heart sounds: Normal heart sounds.  Pulmonary:     Effort: Pulmonary effort is normal. No respiratory distress.     Breath sounds: Normal breath sounds.  Abdominal:     General: Bowel sounds are normal. There is no distension.     Palpations: Abdomen is soft.     Tenderness: There is no abdominal tenderness.  Musculoskeletal:        General: Normal range of motion.     Cervical back: Neck supple.     Right lower leg: No edema.     Left lower leg: No edema.  Lymphadenopathy:     Cervical: No cervical adenopathy.  Skin:    General: Skin is warm and dry.  Neurological:     Mental Status: He is alert. Mental status is at baseline.     Comments: SLUMS 15/30   Psychiatric:        Mood and Affect: Mood normal.       ASSESSMENT/ PLAN:  TODAY  Moderate protein calorie malnutrition: albumin 2.7; will continue supplements as directed  2. Hyperlipidemia associated with type 2 diabetes mellitus: ldl 45 will continue lipitor 20 mg daily   3. Type 2 diabetes mellitus with neurological manifestations: hgb A1c 6.4 is on asa and statin   PREVIOUS    4. Osteoporosis: t score -2.863  5. Hypertension associated with  type 2 diabetes mellitus: b/p 118/68 is on asa will monitor   6. Hyperlipidemia associated with type 2 diabetes mellitus: ldl 45 will continue  lipitor  20 mg daily   7. Chronic constipation: will monitor  8. GERD without esophagitis: will continue prilosec 20 mg daily   9. History of CVA: is on asa 162 mg daily is off plavix   10. Thyroid  disease: tsh 2.175 will continue synthroid  75 mcg daily   11. Chronic depression: is on zoloft 50 mg daily mood state is stable.   12. Hemiplegia and hemiparesis following cerebral infarction unspecified site: is on asa 162 mg daily  13. Vascular dementia without behavioral disturbance, psychotic disturbance, mood disturbance, anxiety. Weight is 182 pounds; without significant change in weight; will continue namenda 7 mg daily   14. CKD stage 3 due to type 2 diabetes mellitus: bun 27; creat 1.58 gfr 44      Barnie Seip NP [facility]   call [phone]

## 2024-06-21 ENCOUNTER — Other Ambulatory Visit (HOSPITAL_COMMUNITY)
Admission: RE | Admit: 2024-06-21 | Discharge: 2024-06-21 | Disposition: A | Discharge status: Home / Self Care | Source: Skilled Nursing Facility | Attending: Internal Medicine | Admitting: Internal Medicine

## 2024-06-21 DIAGNOSIS — E083393 Diabetes mellitus due to underlying condition with moderate nonproliferative diabetic retinopathy without macular edema, bilateral: Secondary | ICD-10-CM | POA: Insufficient documentation

## 2024-06-21 DIAGNOSIS — J1000 Influenza due to other identified influenza virus with unspecified type of pneumonia: Secondary | ICD-10-CM | POA: Insufficient documentation

## 2024-06-21 LAB — RESP PANEL BY RT-PCR (RSV, FLU A&B, COVID)  RVPGX2
Influenza A by PCR: POSITIVE — AB
Influenza B by PCR: NEGATIVE
Resp Syncytial Virus by PCR: NEGATIVE
SARS Coronavirus 2 by RT PCR: NEGATIVE

## 2024-06-22 LAB — BASIC METABOLIC PANEL WITH GFR
Anion gap: 8 (ref 5–15)
BUN: 37 mg/dL — ABNORMAL HIGH (ref 8–23)
CO2: 26 mmol/L (ref 22–32)
Calcium: 8.3 mg/dL — ABNORMAL LOW (ref 8.9–10.3)
Chloride: 102 mmol/L (ref 98–111)
Creatinine, Ser: 1.72 mg/dL — ABNORMAL HIGH (ref 0.61–1.24)
GFR, Estimated: 40 mL/min — ABNORMAL LOW
Glucose, Bld: 123 mg/dL — ABNORMAL HIGH (ref 70–99)
Potassium: 3.9 mmol/L (ref 3.5–5.1)
Sodium: 136 mmol/L (ref 135–145)

## 2024-07-04 ENCOUNTER — Non-Acute Institutional Stay (SKILLED_NURSING_FACILITY): Payer: Self-pay | Admitting: Adult Health

## 2024-07-04 ENCOUNTER — Encounter: Payer: Self-pay | Admitting: Adult Health

## 2024-07-04 DIAGNOSIS — E1169 Type 2 diabetes mellitus with other specified complication: Secondary | ICD-10-CM

## 2024-07-04 DIAGNOSIS — M810 Age-related osteoporosis without current pathological fracture: Secondary | ICD-10-CM | POA: Diagnosis not present

## 2024-07-04 DIAGNOSIS — I10 Essential (primary) hypertension: Secondary | ICD-10-CM | POA: Diagnosis not present

## 2024-07-04 DIAGNOSIS — E785 Hyperlipidemia, unspecified: Secondary | ICD-10-CM

## 2024-07-04 NOTE — Progress Notes (Unsigned)
 " Location:  [facility] Room Number: 111 Place of Service:  SNF (31)   CODE STATUS: dnr   Allergies[1]  Chief Complaint  Patient presents with   Medical Management of Chronic Issues          Osteoporosis: Hypertension associated with type 2 diabetes mellitus:  Hyperlipidemia associated with type 2 diabetes mellitus    HPI:  He is a 79 y.o. long term resident of this facility being seen for the management of his chronic illnesses:Osteoporosis: Hypertension associated with type 2 diabetes mellitus:  Hyperlipidemia associated with type 2 diabetes mellitus. There are no reports of uncontrolled pain. Her blood pressure readings remain stable. He is tolerating statin without complaints of muscle pain.    Past Medical History:  Diagnosis Date   Hypertension    Benign Essential   Hypothyroidism    Obesity    Osteoarthritis    PVD (peripheral vascular disease)    Type 2 diabetes mellitus with vascular complications     Past Surgical History:  Procedure Laterality Date   APPENDECTOMY     TONSILLECTOMY      Social History   Socioeconomic History   Marital status: Single    Spouse name: Not on file   Number of children: Not on file   Years of education: 12   Highest education level: Not on file  Occupational History   Occupation: RETIRED  Tobacco Use   Smoking status: Former   Smokeless tobacco: Never  Vaping Use   Vaping status: Never Used  Substance and Sexual Activity   Alcohol use: Not Currently   Drug use: Never   Sexual activity: Not Currently  Other Topics Concern   Not on file  Social History Narrative   Long term resident of SNF    Social Drivers of Health   Tobacco Use: Medium Risk (07/04/2024)   Patient History    Smoking Tobacco Use: Former    Smokeless Tobacco Use: Never    Passive Exposure: Not on Actuary Strain: Not on file  Food Insecurity: Not on file  Transportation Needs: Not on file  Physical  Activity: Not on file  Stress: Not on file  Social Connections: Not on file  Intimate Partner Violence: Not on file  Depression (PHQ2-9): Low Risk (05/27/2024)   Depression (PHQ2-9)    PHQ-2 Score: 0  Alcohol Screen: Not on file  Housing: Not on file  Utilities: Not on file  Health Literacy: Not on file   Family History  Problem Relation Age of Onset   Osteoporosis Mother    Hypertension Mother    Cancer Mother    Heart attack Father    Heart attack Maternal Grandmother    Pneumonia Maternal Grandfather    Heart disease Paternal Grandmother    Heart disease Paternal Grandfather       VITAL SIGNS BP 108/69   Pulse 69   Temp 98.1 F (36.7 C)   Resp 18   Ht 6' (1.829 m)   Wt 175 lb 3.2 oz (79.5 kg)   SpO2 93%   BMI 23.76 kg/m   Outpatient Encounter Medications as of 07/04/2024  Medication Sig   aspirin  EC 81 MG tablet Take 162 mg by mouth daily. Swallow whole.   atorvastatin  (LIPITOR) 20 MG tablet Take 20 mg by mouth daily.   calcium -vitamin D (OSCAL WITH D) 500-5 MG-MCG tablet Take 1 tablet by mouth daily.   Eyelid Cleansers (OCUSOFT LID SCRUB ORIGINAL EX) Apply  1 Application topically in the morning and at bedtime.   feeding supplement (ENSURE ENLIVE / ENSURE PLUS) LIQD Take 237 mLs by mouth daily. 9 am,  may replace with alternative brand to provide liquid nutritional supplement   fluticasone (FLONASE) 50 MCG/ACT nasal spray Place 1 spray into both nostrils 2 (two) times daily as needed.   levothyroxine  (SYNTHROID ) 75 MCG tablet Take 75 mcg by mouth daily before breakfast.   MEMANTINE HCL PO Take 7 mg by mouth every 12 (twelve) hours. 9 am and 9 pm   NON FORMULARY Diet:Regular Diet with Thin Liquids   Nutritional Supplements (PROSOURCE) LIQD Take 30 mLs by mouth at bedtime.   omeprazole (PRILOSEC OTC) 20 MG tablet Take 20 mg by mouth at bedtime.   Polyvinyl Alcohol-Povidone (ARTIFICIAL TEARS) 5-6 MG/ML SOLN Apply 1 drop to eye every hour as needed. Right eye    sertraline (ZOLOFT) 25 MG tablet Take 50 mg by mouth daily.   triamcinolone cream (KENALOG) 0.1 % Apply 1 Application topically 3 (three) times daily as needed.   No facility-administered encounter medications on file as of 07/04/2024.     SIGNIFICANT DIAGNOSTIC EXAMS  LABS REVIEWED: PREVIOUS     07-13-23: hgb A1c 6.8 urine microalbumin 172.6 07-26-23: wbc 7.5; hgb 11.5; hct 35.6; mcv 97.0 plt 221 09-08-23: tsh 3.137; free t3: 2.1 free t4: 0.91 12-07-23: wbc 9.6; hgb 12.3; hct 37.5; mcv 94.2 plt 258; glucose 144; bun 37; creat 1.58; k+ 4.1; na++ 139; ca 8.8 gfr 44; protein 6.0; albumin 2.7; cho, 93 ldl 45; trig 112; hdl 26 hgb A1c 7.3   TODAY  03-18-24: tsh 2.175 03-21-24: hgb A1c 6.4  06-21-24: glucose 123; bun 37; creat 1.72 k+ 1.72; na++ 136; ca 8.3 gfr 40    Review of Systems  Constitutional:  Negative for malaise/fatigue.  Respiratory:  Negative for cough and shortness of breath.   Cardiovascular:  Negative for chest pain, palpitations and leg swelling.  Gastrointestinal:  Negative for abdominal pain, constipation and heartburn.  Musculoskeletal:  Negative for back pain, joint pain and myalgias.  Skin: Negative.   Neurological:  Negative for dizziness.  Psychiatric/Behavioral:  The patient is not nervous/anxious.     Physical Exam Constitutional:      General: He is not in acute distress.    Appearance: He is well-developed. He is not diaphoretic.  Neck:     Thyroid : No thyromegaly.  Cardiovascular:     Rate and Rhythm: Normal rate and regular rhythm.     Heart sounds: Normal heart sounds.  Pulmonary:     Effort: Pulmonary effort is normal. No respiratory distress.     Breath sounds: Normal breath sounds.  Abdominal:     General: Bowel sounds are normal. There is no distension.     Palpations: Abdomen is soft.     Tenderness: There is no abdominal tenderness.  Musculoskeletal:        General: Normal range of motion.     Cervical back: Neck supple.     Right lower leg:  No edema.     Left lower leg: No edema.  Lymphadenopathy:     Cervical: No cervical adenopathy.  Skin:    General: Skin is warm and dry.  Neurological:     Mental Status: He is alert. Mental status is at baseline.     Comments: SLUMS 15/30  Psychiatric:        Mood and Affect: Mood normal.      ASSESSMENT/ PLAN:  TODAY  Osteoporosis: t score -2.863  2. Hypertension associated with type 2 diabetes mellitus: b/p 108/69: is on asa will monitor  3. Hyperlipidemia associated with type 2 diabetes mellitus: ldl 45 will continue lipitor 20 mg daily   PREVIOUS    4. Chronic constipation: will monitor  5. GERD without esophagitis: will continue prilosec 20 mg daily   6. History of CVA: is on asa 162 mg daily is off plavix   7. Thyroid  disease: tsh 2.175 will continue synthroid  75 mcg daily   8. Chronic depression: is on zoloft 50 mg daily mood state is stable.   9. Hemiplegia and hemiparesis following cerebral infarction unspecified site: is on asa 162 mg daily  10. Vascular dementia without behavioral disturbance, psychotic disturbance, mood disturbance, anxiety. Weight is 175 pounds; without significant change in weight; will continue namenda 7 mg daily   11. CKD stage 3 due to type 2 diabetes mellitus: bun 37; creat 1.72 gfr 44   12. Moderate protein calorie malnutrition: albumin 2.7; will continue supplements as directed  13. Hyperlipidemia associated with type 2 diabetes mellitus: ldl 45 will continue lipitor 20 mg daily   14. Type 2 diabetes mellitus with neurological manifestations: hgb A1c 6.4 is on asa and statin   Will check cbc; cmp; lipids hgb A1c    Barnie Seip NP [facility]   call [phone]      [1] No Known Allergies  "

## 2024-07-12 ENCOUNTER — Non-Acute Institutional Stay (SKILLED_NURSING_FACILITY): Payer: Self-pay | Admitting: Adult Health

## 2024-07-12 ENCOUNTER — Encounter: Payer: Self-pay | Admitting: Adult Health

## 2024-07-12 DIAGNOSIS — E1159 Type 2 diabetes mellitus with other circulatory complications: Secondary | ICD-10-CM

## 2024-07-12 DIAGNOSIS — I700 Atherosclerosis of aorta: Secondary | ICD-10-CM

## 2024-07-12 DIAGNOSIS — I152 Hypertension secondary to endocrine disorders: Secondary | ICD-10-CM | POA: Diagnosis not present

## 2024-07-12 DIAGNOSIS — I69391 Dysphagia following cerebral infarction: Secondary | ICD-10-CM

## 2024-07-12 NOTE — Progress Notes (Signed)
 " Location:  [facility] Room Number: 112 Place of Service:  SNF (31)   CODE STATUS: dnr   Allergies[1]  Chief Complaint  Patient presents with   Acute Visit    Care plan meeting     HPI:  We have come together for his care plan meeting. BIMS 14/15 mood 11/30: little energy, trouble concentrating, nervous, depression. Uses wheelchair has had one fall without injury. He requires moderate to dependent assist with his adl care. He is incontinent of bladder and bowel. Dietary: setup for meals. Regular diet appetite 1-25% of meals is on supplements weight is 175 pounds. Therapy: non-ambulatory; moderate assist with pivots; max assist with transfers, wheelchair supervision. Activities: is active. She will continue to be followed for her chronic illnesses including:   Aortic atherosclerosis    Hypertension associated with type 2 diabetes mellitus  Dysphagia as late effect of cerebrovascular accident (CVA)  Past Medical History:  Diagnosis Date   Hypertension    Benign Essential   Hypothyroidism    Obesity    Osteoarthritis    PVD (peripheral vascular disease)    Type 2 diabetes mellitus with vascular complications     Past Surgical History:  Procedure Laterality Date   APPENDECTOMY     TONSILLECTOMY      Social History   Socioeconomic History   Marital status: Single    Spouse name: Not on file   Number of children: Not on file   Years of education: 12   Highest education level: Not on file  Occupational History   Occupation: RETIRED  Tobacco Use   Smoking status: Former   Smokeless tobacco: Never  Vaping Use   Vaping status: Never Used  Substance and Sexual Activity   Alcohol use: Not Currently   Drug use: Never   Sexual activity: Not Currently  Other Topics Concern   Not on file  Social History Narrative   Long term resident of SNF    Social Drivers of Health   Tobacco Use: Medium Risk (07/12/2024)   Patient History    Smoking Tobacco  Use: Former    Smokeless Tobacco Use: Never    Passive Exposure: Not on Actuary Strain: Not on file  Food Insecurity: Not on file  Transportation Needs: Not on file  Physical Activity: Not on file  Stress: Not on file  Social Connections: Not on file  Intimate Partner Violence: Not on file  Depression (PHQ2-9): Low Risk (05/27/2024)   Depression (PHQ2-9)    PHQ-2 Score: 0  Alcohol Screen: Not on file  Housing: Not on file  Utilities: Not on file  Health Literacy: Not on file   Family History  Problem Relation Age of Onset   Osteoporosis Mother    Hypertension Mother    Cancer Mother    Heart attack Father    Heart attack Maternal Grandmother    Pneumonia Maternal Grandfather    Heart disease Paternal Grandmother    Heart disease Paternal Grandfather       VITAL SIGNS BP 95/65   Pulse 73   Temp (!) 97.2 F (36.2 C)   Resp 20   Ht 6' (1.829 m)   Wt 175 lb 3.2 oz (79.5 kg)   SpO2 95%   BMI 23.76 kg/m   Outpatient Encounter Medications as of 07/12/2024  Medication Sig   aspirin  EC 81 MG tablet Take 162 mg by mouth daily. Swallow whole.   atorvastatin  (LIPITOR) 20 MG tablet Take  20 mg by mouth daily.   calcium -vitamin D (OSCAL WITH D) 500-5 MG-MCG tablet Take 1 tablet by mouth daily.   Eyelid Cleansers (OCUSOFT LID SCRUB ORIGINAL EX) Apply 1 Application topically in the morning and at bedtime.   feeding supplement (ENSURE ENLIVE / ENSURE PLUS) LIQD Take 237 mLs by mouth daily. 9 am,  may replace with alternative brand to provide liquid nutritional supplement   fluticasone (FLONASE) 50 MCG/ACT nasal spray Place 1 spray into both nostrils 2 (two) times daily as needed.   levothyroxine  (SYNTHROID ) 75 MCG tablet Take 75 mcg by mouth daily before breakfast.   MEMANTINE HCL PO Take 7 mg by mouth every 12 (twelve) hours. 9 am and 9 pm   NON FORMULARY Diet:Regular Diet with Thin Liquids   Nutritional Supplements (PROSOURCE) LIQD Take 30 mLs by mouth at  bedtime.   omeprazole (PRILOSEC OTC) 20 MG tablet Take 20 mg by mouth at bedtime.   Polyvinyl Alcohol-Povidone (ARTIFICIAL TEARS) 5-6 MG/ML SOLN Apply 1 drop to eye every hour as needed. Right eye   sertraline (ZOLOFT) 25 MG tablet Take 50 mg by mouth daily.   triamcinolone cream (KENALOG) 0.1 % Apply 1 Application topically 3 (three) times daily as needed.   No facility-administered encounter medications on file as of 07/12/2024.     SIGNIFICANT DIAGNOSTIC EXAMS  LABS REVIEWED: PREVIOUS     07-13-23: hgb A1c 6.8 urine microalbumin 172.6 07-26-23: wbc 7.5; hgb 11.5; hct 35.6; mcv 97.0 plt 221 09-08-23: tsh 3.137; free t3: 2.1 free t4: 0.91 12-07-23: wbc 9.6; hgb 12.3; hct 37.5; mcv 94.2 plt 258; glucose 144; bun 37; creat 1.58; k+ 4.1; na++ 139; ca 8.8 gfr 44; protein 6.0; albumin 2.7; cho, 93 ldl 45; trig 112; hdl 26 hgb A1c 7.3   TODAY  03-18-24: tsh 2.175 03-21-24: hgb A1c 6.4  06-21-24: glucose 123; bun 37; creat 1.72 k+ 1.72; na++ 136; ca 8.3 gfr 40     Review of Systems  Constitutional:  Negative for malaise/fatigue.  Respiratory:  Negative for cough and shortness of breath.   Cardiovascular:  Negative for chest pain, palpitations and leg swelling.  Gastrointestinal:  Negative for abdominal pain, constipation and heartburn.  Musculoskeletal:  Negative for back pain, joint pain and myalgias.  Skin: Negative.   Neurological:  Negative for dizziness.  Psychiatric/Behavioral:  The patient is not nervous/anxious.     Physical Exam Constitutional:      General: He is not in acute distress.    Appearance: He is well-developed. He is not diaphoretic.  Neck:     Thyroid : No thyromegaly.  Cardiovascular:     Rate and Rhythm: Normal rate and regular rhythm.     Heart sounds: Normal heart sounds.  Pulmonary:     Effort: Pulmonary effort is normal. No respiratory distress.     Breath sounds: Normal breath sounds.  Abdominal:     General: Bowel sounds are normal. There is no  distension.     Palpations: Abdomen is soft.     Tenderness: There is no abdominal tenderness.  Musculoskeletal:        General: Normal range of motion.     Cervical back: Neck supple.     Right lower leg: No edema.     Left lower leg: No edema.  Lymphadenopathy:     Cervical: No cervical adenopathy.  Skin:    General: Skin is warm and dry.  Neurological:     Mental Status: He is alert. Mental status is at  baseline.     Comments: SLUMS 15/30  Psychiatric:        Mood and Affect: Mood normal.       ASSESSMENT/ PLAN:  TODAY  Aortic atherosclerosis Hypertension associated with type 2 diabetes mellitus Dysphagia as late effect of cerebrovascular accident (CVA)  Will continue current medications Will continue current plan of care Will continue to monitor his status   Time spent with patient: 40 minutes: medications; dietary; plan of care.    Barnie Seip NP [facility]  call [phone]     [1] No Known Allergies  "

## 2024-07-15 ENCOUNTER — Non-Acute Institutional Stay (SKILLED_NURSING_FACILITY): Payer: Self-pay | Admitting: Adult Health

## 2024-07-15 ENCOUNTER — Other Ambulatory Visit (HOSPITAL_COMMUNITY)
Admission: RE | Admit: 2024-07-15 | Discharge: 2024-07-15 | Disposition: A | Source: Skilled Nursing Facility | Attending: Adult Health | Admitting: Adult Health

## 2024-07-15 DIAGNOSIS — E440 Moderate protein-calorie malnutrition: Secondary | ICD-10-CM

## 2024-07-15 DIAGNOSIS — E083393 Diabetes mellitus due to underlying condition with moderate nonproliferative diabetic retinopathy without macular edema, bilateral: Secondary | ICD-10-CM | POA: Insufficient documentation

## 2024-07-15 LAB — COMPREHENSIVE METABOLIC PANEL WITH GFR
ALT: 7 U/L (ref 0–44)
AST: 14 U/L — ABNORMAL LOW (ref 15–41)
Albumin: 2.6 g/dL — ABNORMAL LOW (ref 3.5–5.0)
Alkaline Phosphatase: 106 U/L (ref 38–126)
Anion gap: 11 (ref 5–15)
BUN: 25 mg/dL — ABNORMAL HIGH (ref 8–23)
CO2: 22 mmol/L (ref 22–32)
Calcium: 8.5 mg/dL — ABNORMAL LOW (ref 8.9–10.3)
Chloride: 110 mmol/L (ref 98–111)
Creatinine, Ser: 1.3 mg/dL — ABNORMAL HIGH (ref 0.61–1.24)
GFR, Estimated: 55 mL/min — ABNORMAL LOW
Glucose, Bld: 114 mg/dL — ABNORMAL HIGH (ref 70–99)
Potassium: 4 mmol/L (ref 3.5–5.1)
Sodium: 143 mmol/L (ref 135–145)
Total Bilirubin: <0.2 mg/dL (ref 0.0–1.2)
Total Protein: 5.7 g/dL — ABNORMAL LOW (ref 6.5–8.1)

## 2024-07-15 LAB — LIPID PANEL
Cholesterol: 108 mg/dL (ref 0–200)
HDL: 26 mg/dL — ABNORMAL LOW
LDL Cholesterol: 59 mg/dL (ref 0–99)
Total CHOL/HDL Ratio: 4.1 ratio
Triglycerides: 113 mg/dL
VLDL: 23 mg/dL (ref 0–40)

## 2024-07-15 LAB — CBC
HCT: 32.6 % — ABNORMAL LOW (ref 39.0–52.0)
Hemoglobin: 10.4 g/dL — ABNORMAL LOW (ref 13.0–17.0)
MCH: 30.1 pg (ref 26.0–34.0)
MCHC: 31.9 g/dL (ref 30.0–36.0)
MCV: 94.2 fL (ref 80.0–100.0)
Platelets: 329 K/uL (ref 150–400)
RBC: 3.46 MIL/uL — ABNORMAL LOW (ref 4.22–5.81)
RDW: 13.4 % (ref 11.5–15.5)
WBC: 8.3 K/uL (ref 4.0–10.5)
nRBC: 0 % (ref 0.0–0.2)

## 2024-07-15 LAB — HEMOGLOBIN A1C
Hgb A1c MFr Bld: 7.2 % — ABNORMAL HIGH (ref 4.8–5.6)
Mean Plasma Glucose: 159.94 mg/dL

## 2024-07-15 NOTE — Progress Notes (Unsigned)
 " Location:  [facility] Room Number: 110 Place of Service:  SNF (31)   CODE STATUS: dnr   Allergies[1]  Chief Complaint  Patient presents with   Acute Visit    Follow up labs     HPI:  His albumin is low at 2.6. His weight is stable at 175 pounds. There are no reports of poor appetite or poor fluid intake. His vital signs remain stable.   Past Medical History:  Diagnosis Date   Hypertension    Benign Essential   Hypothyroidism    Obesity    Osteoarthritis    PVD (peripheral vascular disease)    Type 2 diabetes mellitus with vascular complications     Past Surgical History:  Procedure Laterality Date   APPENDECTOMY     TONSILLECTOMY      Social History   Socioeconomic History   Marital status: Single    Spouse name: Not on file   Number of children: Not on file   Years of education: 12   Highest education level: Not on file  Occupational History   Occupation: RETIRED  Tobacco Use   Smoking status: Former   Smokeless tobacco: Never  Vaping Use   Vaping status: Never Used  Substance and Sexual Activity   Alcohol use: Not Currently   Drug use: Never   Sexual activity: Not Currently  Other Topics Concern   Not on file  Social History Narrative   Long term resident of SNF    Social Drivers of Health   Tobacco Use: Medium Risk (07/12/2024)   Patient History    Smoking Tobacco Use: Former    Smokeless Tobacco Use: Never    Passive Exposure: Not on Actuary Strain: Not on file  Food Insecurity: Not on file  Transportation Needs: Not on file  Physical Activity: Not on file  Stress: Not on file  Social Connections: Not on file  Intimate Partner Violence: Not on file  Depression (PHQ2-9): Low Risk (05/27/2024)   Depression (PHQ2-9)    PHQ-2 Score: 0  Alcohol Screen: Not on file  Housing: Not on file  Utilities: Not on file  Health Literacy: Not on file   Family History  Problem Relation Age of Onset    Osteoporosis Mother    Hypertension Mother    Cancer Mother    Heart attack Father    Heart attack Maternal Grandmother    Pneumonia Maternal Grandfather    Heart disease Paternal Grandmother    Heart disease Paternal Grandfather       VITAL SIGNS BP 108/62   Pulse 74   Temp 98.6 F (37 C)   Resp 20   Ht 6' (1.829 m)   Wt 175 lb 3.2 oz (79.5 kg)   SpO2 98%   BMI 23.76 kg/m   Outpatient Encounter Medications as of 07/15/2024  Medication Sig   aspirin  EC 81 MG tablet Take 162 mg by mouth daily. Swallow whole.   atorvastatin  (LIPITOR) 20 MG tablet Take 20 mg by mouth daily.   calcium -vitamin D (OSCAL WITH D) 500-5 MG-MCG tablet Take 1 tablet by mouth daily.   Eyelid Cleansers (OCUSOFT LID SCRUB ORIGINAL EX) Apply 1 Application topically in the morning and at bedtime.   feeding supplement (ENSURE ENLIVE / ENSURE PLUS) LIQD Take 237 mLs by mouth daily. 9 am,  may replace with alternative brand to provide liquid nutritional supplement   fluticasone (FLONASE) 50 MCG/ACT nasal spray Place 1 spray into  both nostrils 2 (two) times daily as needed.   levothyroxine  (SYNTHROID ) 75 MCG tablet Take 75 mcg by mouth daily before breakfast.   MEMANTINE HCL PO Take 7 mg by mouth every 12 (twelve) hours. 9 am and 9 pm   NON FORMULARY Diet:Regular Diet with Thin Liquids   Nutritional Supplements (PROSOURCE) LIQD Take 30 mLs by mouth at bedtime.   omeprazole (PRILOSEC OTC) 20 MG tablet Take 20 mg by mouth at bedtime.   Polyvinyl Alcohol-Povidone (ARTIFICIAL TEARS) 5-6 MG/ML SOLN Apply 1 drop to eye every hour as needed. Right eye   sertraline (ZOLOFT) 25 MG tablet Take 50 mg by mouth daily.   triamcinolone cream (KENALOG) 0.1 % Apply 1 Application topically 3 (three) times daily as needed.   No facility-administered encounter medications on file as of 07/15/2024.     SIGNIFICANT DIAGNOSTIC EXAMS  LABS REVIEWED: PREVIOUS     07-13-23: hgb A1c 6.8 urine microalbumin 172.6 07-26-23: wbc 7.5; hgb  11.5; hct 35.6; mcv 97.0 plt 221 09-08-23: tsh 3.137; free t3: 2.1 free t4: 0.91 12-07-23: wbc 9.6; hgb 12.3; hct 37.5; mcv 94.2 plt 258; glucose 144; bun 37; creat 1.58; k+ 4.1; na++ 139; ca 8.8 gfr 44; protein 6.0; albumin 2.7; cho, 93 ldl 45; trig 112; hdl 26 hgb A1c 7.3   TODAY  03-18-24: tsh 2.175 03-21-24: hgb A1c 6.4  06-21-24: glucose 123; bun 37; creat 1.72 k+ 1.72; na++ 136; ca 8.3 gfr 40   07-15-24: wbc 8.3; hgb 10.4; hct 32.6; mcv 94.2 plt 329; glucose 114; bun 25; creat 1.30; k+ 4.0 na++ 143; ca 8.5 gfr 55; protein 5.7 albumin 2.6; chol 108; ldl 50 trig 113; hdl 26; hgb A1c 7.2   Review of Systems  Constitutional:  Negative for malaise/fatigue.  Respiratory:  Negative for cough and shortness of breath.   Cardiovascular:  Negative for chest pain, palpitations and leg swelling.  Gastrointestinal:  Negative for abdominal pain, constipation and heartburn.  Musculoskeletal:  Negative for back pain, joint pain and myalgias.  Skin: Negative.   Neurological:  Negative for dizziness.  Psychiatric/Behavioral:  The patient is not nervous/anxious.     Physical Exam Constitutional:      General: He is not in acute distress.    Appearance: He is well-developed. He is not diaphoretic.  Neck:     Thyroid : No thyromegaly.  Cardiovascular:     Rate and Rhythm: Normal rate and regular rhythm.     Heart sounds: Normal heart sounds.  Pulmonary:     Effort: Pulmonary effort is normal. No respiratory distress.     Breath sounds: Normal breath sounds.  Abdominal:     General: Bowel sounds are normal. There is no distension.     Palpations: Abdomen is soft.     Tenderness: There is no abdominal tenderness.  Musculoskeletal:        General: Normal range of motion.     Cervical back: Neck supple.     Right lower leg: No edema.     Left lower leg: No edema.  Lymphadenopathy:     Cervical: No cervical adenopathy.  Skin:    General: Skin is warm and dry.  Neurological:     Mental Status: He  is alert. Mental status is at baseline.     Comments: SLUMS 15/30  Psychiatric:        Mood and Affect: Mood normal.      ASSESSMENT/ PLAN:  TODAY  Moderate protein calorie malnutrition: albumin 2.6 will begin prosource  30 mL twice daily    Barnie Seip NP [facility]   call [phone]      [1] No Known Allergies  "
